# Patient Record
Sex: Female | Born: 1945 | Race: White | Hispanic: No | Marital: Married | Smoking: Former smoker
Health system: Southern US, Community
[De-identification: ages and names within clinical notes are randomized; demographics above are authoritative.]

## PROBLEM LIST (undated history)

## (undated) DIAGNOSIS — I10 Essential (primary) hypertension: Secondary | ICD-10-CM

## (undated) DIAGNOSIS — Z923 Personal history of irradiation: Secondary | ICD-10-CM

## (undated) DIAGNOSIS — E785 Hyperlipidemia, unspecified: Secondary | ICD-10-CM

## (undated) DIAGNOSIS — Z8042 Family history of malignant neoplasm of prostate: Secondary | ICD-10-CM

## (undated) DIAGNOSIS — F419 Anxiety disorder, unspecified: Secondary | ICD-10-CM

## (undated) DIAGNOSIS — R011 Cardiac murmur, unspecified: Secondary | ICD-10-CM

## (undated) DIAGNOSIS — G709 Myoneural disorder, unspecified: Secondary | ICD-10-CM

## (undated) DIAGNOSIS — M1990 Unspecified osteoarthritis, unspecified site: Secondary | ICD-10-CM

## (undated) DIAGNOSIS — IMO0001 Reserved for inherently not codable concepts without codable children: Secondary | ICD-10-CM

## (undated) DIAGNOSIS — IMO0002 Reserved for concepts with insufficient information to code with codable children: Secondary | ICD-10-CM

## (undated) DIAGNOSIS — C801 Malignant (primary) neoplasm, unspecified: Secondary | ICD-10-CM

## (undated) DIAGNOSIS — Z803 Family history of malignant neoplasm of breast: Secondary | ICD-10-CM

## (undated) DIAGNOSIS — R52 Pain, unspecified: Secondary | ICD-10-CM

## (undated) DIAGNOSIS — Z8051 Family history of malignant neoplasm of kidney: Secondary | ICD-10-CM

## (undated) HISTORY — DX: Hyperlipidemia, unspecified: E78.5

## (undated) HISTORY — PX: CATARACT EXTRACTION: SUR2

## (undated) HISTORY — DX: Unspecified osteoarthritis, unspecified site: M19.90

## (undated) HISTORY — PX: ABDOMINAL HYSTERECTOMY: SHX81

## (undated) HISTORY — PX: GANGLION CYST EXCISION: SHX1691

## (undated) HISTORY — PX: COLONOSCOPY: SHX174

## (undated) HISTORY — DX: Reserved for concepts with insufficient information to code with codable children: IMO0002

## (undated) HISTORY — DX: Family history of malignant neoplasm of breast: Z80.3

## (undated) HISTORY — PX: WISDOM TOOTH EXTRACTION: SHX21

## (undated) HISTORY — PX: FRACTURE SURGERY: SHX138

## (undated) HISTORY — DX: Reserved for inherently not codable concepts without codable children: IMO0001

## (undated) HISTORY — DX: Cardiac murmur, unspecified: R01.1

## (undated) HISTORY — DX: Family history of malignant neoplasm of prostate: Z80.42

## (undated) HISTORY — DX: Family history of malignant neoplasm of kidney: Z80.51

---

## 1999-04-05 ENCOUNTER — Encounter (INDEPENDENT_AMBULATORY_CARE_PROVIDER_SITE_OTHER): Payer: Self-pay | Admitting: Specialist

## 1999-04-05 ENCOUNTER — Other Ambulatory Visit: Admission: RE | Admit: 1999-04-05 | Discharge: 1999-04-05 | Payer: Self-pay | Admitting: Obstetrics and Gynecology

## 1999-06-08 ENCOUNTER — Encounter (INDEPENDENT_AMBULATORY_CARE_PROVIDER_SITE_OTHER): Payer: Self-pay | Admitting: Specialist

## 1999-06-08 ENCOUNTER — Ambulatory Visit (HOSPITAL_COMMUNITY): Admission: RE | Admit: 1999-06-08 | Discharge: 1999-06-08 | Payer: Self-pay | Admitting: Obstetrics and Gynecology

## 2000-01-28 ENCOUNTER — Other Ambulatory Visit: Admission: RE | Admit: 2000-01-28 | Discharge: 2000-01-28 | Payer: Self-pay | Admitting: Obstetrics and Gynecology

## 2000-02-18 ENCOUNTER — Encounter: Admission: RE | Admit: 2000-02-18 | Discharge: 2000-02-18 | Payer: Self-pay | Admitting: Obstetrics and Gynecology

## 2000-02-18 ENCOUNTER — Encounter: Payer: Self-pay | Admitting: Obstetrics and Gynecology

## 2001-03-26 ENCOUNTER — Encounter: Admission: RE | Admit: 2001-03-26 | Discharge: 2001-03-26 | Payer: Self-pay | Admitting: Obstetrics and Gynecology

## 2001-03-26 ENCOUNTER — Encounter: Payer: Self-pay | Admitting: Obstetrics and Gynecology

## 2001-04-27 ENCOUNTER — Other Ambulatory Visit: Admission: RE | Admit: 2001-04-27 | Discharge: 2001-04-27 | Payer: Self-pay | Admitting: Obstetrics and Gynecology

## 2002-05-18 ENCOUNTER — Encounter: Payer: Self-pay | Admitting: Family Medicine

## 2002-05-18 ENCOUNTER — Encounter: Admission: RE | Admit: 2002-05-18 | Discharge: 2002-05-18 | Payer: Self-pay | Admitting: Family Medicine

## 2003-04-19 ENCOUNTER — Other Ambulatory Visit: Admission: RE | Admit: 2003-04-19 | Discharge: 2003-04-19 | Payer: Self-pay | Admitting: Obstetrics and Gynecology

## 2003-05-26 ENCOUNTER — Encounter: Payer: Self-pay | Admitting: Family Medicine

## 2003-05-26 ENCOUNTER — Encounter: Admission: RE | Admit: 2003-05-26 | Discharge: 2003-05-26 | Payer: Self-pay | Admitting: Family Medicine

## 2004-04-24 ENCOUNTER — Other Ambulatory Visit: Admission: RE | Admit: 2004-04-24 | Discharge: 2004-04-24 | Payer: Self-pay | Admitting: Obstetrics and Gynecology

## 2004-05-29 ENCOUNTER — Ambulatory Visit (HOSPITAL_COMMUNITY): Admission: RE | Admit: 2004-05-29 | Discharge: 2004-05-29 | Payer: Self-pay | Admitting: Obstetrics and Gynecology

## 2005-06-11 ENCOUNTER — Other Ambulatory Visit: Admission: RE | Admit: 2005-06-11 | Discharge: 2005-06-11 | Payer: Self-pay | Admitting: Obstetrics and Gynecology

## 2005-07-02 ENCOUNTER — Ambulatory Visit (HOSPITAL_COMMUNITY): Admission: RE | Admit: 2005-07-02 | Discharge: 2005-07-02 | Payer: Self-pay | Admitting: Obstetrics and Gynecology

## 2005-07-04 ENCOUNTER — Ambulatory Visit: Payer: Self-pay | Admitting: Internal Medicine

## 2005-07-11 ENCOUNTER — Encounter: Admission: RE | Admit: 2005-07-11 | Discharge: 2005-07-11 | Payer: Self-pay | Admitting: Obstetrics and Gynecology

## 2005-07-16 ENCOUNTER — Encounter: Admission: RE | Admit: 2005-07-16 | Discharge: 2005-07-16 | Payer: Self-pay | Admitting: Obstetrics and Gynecology

## 2005-07-19 ENCOUNTER — Ambulatory Visit: Payer: Self-pay | Admitting: Internal Medicine

## 2006-01-07 ENCOUNTER — Encounter: Admission: RE | Admit: 2006-01-07 | Discharge: 2006-01-07 | Payer: Self-pay | Admitting: Obstetrics and Gynecology

## 2006-07-29 ENCOUNTER — Encounter: Admission: RE | Admit: 2006-07-29 | Discharge: 2006-07-29 | Payer: Self-pay | Admitting: Obstetrics and Gynecology

## 2007-07-31 ENCOUNTER — Encounter: Admission: RE | Admit: 2007-07-31 | Discharge: 2007-07-31 | Payer: Self-pay | Admitting: Obstetrics and Gynecology

## 2008-08-30 ENCOUNTER — Encounter: Admission: RE | Admit: 2008-08-30 | Discharge: 2008-08-30 | Payer: Self-pay | Admitting: Obstetrics and Gynecology

## 2009-09-01 ENCOUNTER — Encounter: Admission: RE | Admit: 2009-09-01 | Discharge: 2009-09-01 | Payer: Self-pay | Admitting: Obstetrics and Gynecology

## 2010-10-04 ENCOUNTER — Encounter
Admission: RE | Admit: 2010-10-04 | Discharge: 2010-10-04 | Payer: Self-pay | Source: Home / Self Care | Attending: Obstetrics and Gynecology | Admitting: Obstetrics and Gynecology

## 2010-10-20 ENCOUNTER — Encounter: Payer: Self-pay | Admitting: Obstetrics and Gynecology

## 2011-06-28 ENCOUNTER — Other Ambulatory Visit: Payer: Self-pay | Admitting: Internal Medicine

## 2011-06-28 DIAGNOSIS — N858 Other specified noninflammatory disorders of uterus: Secondary | ICD-10-CM

## 2011-07-04 ENCOUNTER — Ambulatory Visit
Admission: RE | Admit: 2011-07-04 | Discharge: 2011-07-04 | Disposition: A | Discharge status: Home / Self Care | Payer: Medicare Other | Source: Ambulatory Visit | Attending: Internal Medicine | Admitting: Internal Medicine

## 2011-07-04 DIAGNOSIS — N858 Other specified noninflammatory disorders of uterus: Secondary | ICD-10-CM

## 2011-11-05 DIAGNOSIS — H52219 Irregular astigmatism, unspecified eye: Secondary | ICD-10-CM | POA: Diagnosis not present

## 2011-11-05 DIAGNOSIS — H04129 Dry eye syndrome of unspecified lacrimal gland: Secondary | ICD-10-CM | POA: Diagnosis not present

## 2011-12-17 DIAGNOSIS — H04129 Dry eye syndrome of unspecified lacrimal gland: Secondary | ICD-10-CM | POA: Diagnosis not present

## 2011-12-17 DIAGNOSIS — H0230 Blepharochalasis unspecified eye, unspecified eyelid: Secondary | ICD-10-CM | POA: Diagnosis not present

## 2011-12-17 DIAGNOSIS — H18429 Band keratopathy, unspecified eye: Secondary | ICD-10-CM | POA: Diagnosis not present

## 2012-01-22 ENCOUNTER — Other Ambulatory Visit: Payer: Self-pay | Admitting: Obstetrics and Gynecology

## 2012-01-22 DIAGNOSIS — Z1231 Encounter for screening mammogram for malignant neoplasm of breast: Secondary | ICD-10-CM

## 2012-01-30 ENCOUNTER — Ambulatory Visit
Admission: RE | Admit: 2012-01-30 | Discharge: 2012-01-30 | Disposition: A | Discharge status: Home / Self Care | Payer: Medicare Other | Source: Ambulatory Visit | Attending: Obstetrics and Gynecology | Admitting: Obstetrics and Gynecology

## 2012-01-30 DIAGNOSIS — Z1231 Encounter for screening mammogram for malignant neoplasm of breast: Secondary | ICD-10-CM | POA: Diagnosis not present

## 2012-02-07 DIAGNOSIS — H2510 Age-related nuclear cataract, unspecified eye: Secondary | ICD-10-CM | POA: Diagnosis not present

## 2012-02-07 DIAGNOSIS — H04129 Dry eye syndrome of unspecified lacrimal gland: Secondary | ICD-10-CM | POA: Diagnosis not present

## 2012-02-07 DIAGNOSIS — H0230 Blepharochalasis unspecified eye, unspecified eyelid: Secondary | ICD-10-CM | POA: Diagnosis not present

## 2012-02-07 DIAGNOSIS — H43819 Vitreous degeneration, unspecified eye: Secondary | ICD-10-CM | POA: Diagnosis not present

## 2012-03-20 ENCOUNTER — Ambulatory Visit (INDEPENDENT_AMBULATORY_CARE_PROVIDER_SITE_OTHER): Payer: Medicare Other | Admitting: Family Medicine

## 2012-03-20 VITALS — BP 127/68 | HR 67 | Temp 98.0°F | Resp 16 | Ht 63.75 in | Wt 123.0 lb

## 2012-03-20 DIAGNOSIS — J029 Acute pharyngitis, unspecified: Secondary | ICD-10-CM

## 2012-03-20 DIAGNOSIS — R059 Cough, unspecified: Secondary | ICD-10-CM | POA: Diagnosis not present

## 2012-03-20 DIAGNOSIS — R05 Cough: Secondary | ICD-10-CM

## 2012-03-20 LAB — POCT RAPID STREP A (OFFICE): Rapid Strep A Screen: NEGATIVE

## 2012-03-20 NOTE — Progress Notes (Signed)
Patient Name: Dawn Powers Date of Birth: 06-25-1946 Medical Record Number: [id number] Gender: female Date of Encounter: 03/20/2012  History of Present Illness:  Dawn Powers is a 66 y.o. very pleasant female patient who presents with the following:  She noted a tickle and itchy cough for about 10 days.   Cough is productive in the morning, dry during the day and night.  No nasal congestion.  She does have a PND and a ST.  Her grandson is coming into town today and she wants to be sure she does not have strep.   Her grandson did have strep about 2 weeks ago- she was with him then.  He will be back today and she wants to be sure she does not have ST No fever, some chills.  No GI symptoms.  She has "not felt that bad."  Ears feel sore as well.    There is no problem list on file for this patient.  No past medical history on file. No past surgical history on file. History  Substance Use Topics  . Smoking status: Former Games developer  . Smokeless tobacco: Not on file  . Alcohol Use: Not on file   No family history on file. Allergies  Allergen Reactions  . Morphine And Related Hives    Medication list has been reviewed and updated.  Prior to Admission medications   Medication Sig Start Date End Date Taking? Authorizing Provider  calcium & magnesium carbonates (MYLANTA) 311-232 MG per tablet Take 1 tablet by mouth daily.   Yes Historical Provider, MD  glucosamine-chondroitin 500-400 MG tablet Take 1 tablet by mouth 3 (three) times daily.   Yes Historical Provider, MD  guaiFENesin (MUCINEX) 600 MG 12 hr tablet Take 1,200 mg by mouth 2 (two) times daily.   Yes Historical Provider, MD  magnesium 30 MG tablet Take 30 mg by mouth 2 (two) times daily.   Yes Historical Provider, MD    Review of Systems:  As per HPI- otherwise negative.   Physical Examination: Filed Vitals:   03/20/12 0806  BP: 127/68  Pulse: 67  Temp: 98 F (36.7 C)  Resp: 16   Filed Vitals:   03/20/12 0806    Height: 5' 3.75" (1.619 m)  Weight: 123 lb (55.792 kg)   Body mass index is 21.28 kg/(m^2). Ideal Body Weight: Weight in (lb) to have BMI = 25: 144.2   GEN: WDWN, NAD, Non-toxic, A & O x 3, slim build HEENT: Atraumatic, Normocephalic. Neck supple. No masses, No LAD.  TM and oropharynx wnl, nasal cavity normal Ears and Nose: No external deformity. CV: RRR, No M/G/R. No JVD. No thrill. No extra heart sounds. PULM: CTA B, no wheezes, crackles, rhonchi. No retractions. No resp. distress. No accessory muscle use. EXTR: No c/c/e NEURO Normal gait.  PSYCH: Normally interactive. Conversant. Not depressed or anxious appearing.  Calm demeanor.   Results for orders placed in visit on 03/20/12  POCT RAPID STREP A (OFFICE)      Component Value Range   Rapid Strep A Screen Negative  Negative    Assessment and Plan: 1. Sore throat  POCT rapid strep A, Culture, Group A Strep  2. Cough     Suspect a viral URI.  She feels as through she is getting better.  Declined anything like tessalon perles or atrovent nasal- she feels that she is getting along ok.  Will let her know if throat culture turns up positive.   Patient (or parent if  minor) instructed to return to clinic or call if not better in 2-3 day(s).  Sooner if worse.      Abbe Amsterdam, MD

## 2012-03-22 LAB — CULTURE, GROUP A STREP: Organism ID, Bacteria: NORMAL

## 2012-07-30 ENCOUNTER — Encounter: Payer: Self-pay | Admitting: Family Medicine

## 2012-07-30 ENCOUNTER — Ambulatory Visit (INDEPENDENT_AMBULATORY_CARE_PROVIDER_SITE_OTHER): Payer: Medicare Other | Admitting: Family Medicine

## 2012-07-30 VITALS — BP 102/60 | HR 67 | Temp 99.1°F | Resp 16 | Ht 63.0 in | Wt 123.2 lb

## 2012-07-30 DIAGNOSIS — Z0000 Encounter for general adult medical examination without abnormal findings: Secondary | ICD-10-CM | POA: Diagnosis not present

## 2012-07-30 DIAGNOSIS — E785 Hyperlipidemia, unspecified: Secondary | ICD-10-CM | POA: Diagnosis not present

## 2012-07-30 DIAGNOSIS — L989 Disorder of the skin and subcutaneous tissue, unspecified: Secondary | ICD-10-CM | POA: Diagnosis not present

## 2012-07-30 DIAGNOSIS — L509 Urticaria, unspecified: Secondary | ICD-10-CM

## 2012-07-30 DIAGNOSIS — Z23 Encounter for immunization: Secondary | ICD-10-CM | POA: Diagnosis not present

## 2012-07-30 DIAGNOSIS — M899 Disorder of bone, unspecified: Secondary | ICD-10-CM | POA: Diagnosis not present

## 2012-07-30 DIAGNOSIS — M949 Disorder of cartilage, unspecified: Secondary | ICD-10-CM | POA: Diagnosis not present

## 2012-07-30 DIAGNOSIS — M8580 Other specified disorders of bone density and structure, unspecified site: Secondary | ICD-10-CM

## 2012-07-30 LAB — CBC
HCT: 40.4 % (ref 36.0–46.0)
Hemoglobin: 13.9 g/dL (ref 12.0–15.0)
MCH: 32 pg (ref 26.0–34.0)
MCHC: 34.4 g/dL (ref 30.0–36.0)
MCV: 92.9 fL (ref 78.0–100.0)
Platelets: 220 10*3/uL (ref 150–400)
RBC: 4.35 MIL/uL (ref 3.87–5.11)
RDW: 13.1 % (ref 11.5–15.5)
WBC: 3.6 10*3/uL — ABNORMAL LOW (ref 4.0–10.5)

## 2012-07-30 LAB — LIPID PANEL
Cholesterol: 257 mg/dL — ABNORMAL HIGH (ref 0–200)
HDL: 102 mg/dL (ref >39)
LDL Cholesterol: 144 mg/dL — ABNORMAL HIGH (ref 0–99)
Total CHOL/HDL Ratio: 2.5 Ratio
Triglycerides: 54 mg/dL (ref <150)
VLDL: 11 mg/dL (ref 0–40)

## 2012-07-30 LAB — POCT UA - MICROSCOPIC ONLY
Bacteria, U Microscopic: NEGATIVE
Casts, Ur, LPF, POC: NEGATIVE
Crystals, Ur, HPF, POC: NEGATIVE
Mucus, UA: NEGATIVE
RBC, urine, microscopic: NEGATIVE
WBC, Ur, HPF, POC: NEGATIVE
Yeast, UA: NEGATIVE

## 2012-07-30 LAB — COMPREHENSIVE METABOLIC PANEL
ALT: 18 U/L (ref 0–35)
AST: 22 U/L (ref 0–37)
Albumin: 4.6 g/dL (ref 3.5–5.2)
Alkaline Phosphatase: 45 U/L (ref 39–117)
BUN: 20 mg/dL (ref 6–23)
CO2: 26 mEq/L (ref 19–32)
Calcium: 10 mg/dL (ref 8.4–10.5)
Chloride: 104 mEq/L (ref 96–112)
Creat: 1 mg/dL (ref 0.50–1.10)
Glucose, Bld: 87 mg/dL (ref 70–99)
Potassium: 4.1 mEq/L (ref 3.5–5.3)
Sodium: 143 mEq/L (ref 135–145)
Total Bilirubin: 0.9 mg/dL (ref 0.3–1.2)
Total Protein: 7 g/dL (ref 6.0–8.3)

## 2012-07-30 LAB — POCT URINALYSIS DIPSTICK
Bilirubin, UA: NEGATIVE
Blood, UA: NEGATIVE
Glucose, UA: NEGATIVE
Ketones, UA: NEGATIVE
Leukocytes, UA: NEGATIVE
Nitrite, UA: NEGATIVE
Protein, UA: NEGATIVE
Spec Grav, UA: 1.015
Urobilinogen, UA: 0.2
pH, UA: 8.5

## 2012-07-30 NOTE — Progress Notes (Signed)
@[email or web address] @  Patient ID: Dawn Powers MRN: [id number], DOB: [birth year], 66 y.o. Date of Encounter: 07/30/2012, 9:21 AM  Primary Physician: No primary provider on file.  Chief Complaint: Physical (CPE)  HPI: 66 y.o. y/o female with history of noted below here for CPE.  Doing well:  Concerned about osteopenia but refuses to take bisphosphonates  Right foot pain with skin thickening  Some left shoulder crepitus  Lost 20 lbs on her own over past 15 months  Doing yoga  Concerned about breast cancer in left breast, no sure if there is a lump  Night cramps in calves despite magnesium supplementation Son died 6 years ago after suicide from many years of drugs and mental illness.  LMP: n/a (pelvic exam by Dr. Jackelyn Knife 1 year ago) Pap: 1 year ago MMG:  May 2013 Review of Systems: Consitutional: No fever, chills, fatigue, night sweats, lymphadenopathy, or weight changes. Eyes: No visual changes, eye redness, or discharge. ENT/Mouth: Ears: No otalgia, tinnitus, hearing loss, discharge. Nose: No congestion, rhinorrhea, sinus pain, or epistaxis. Throat: No sore throat, post nasal drip, or teeth pain. Cardiovascular: No CP, palpitations, diaphoresis, DOE, edema, orthopnea, PND. Respiratory: No cough, hemoptysis, SOB, or wheezing. Gastrointestinal: No anorexia, dysphagia, reflux, pain, nausea, vomiting, hematemesis, diarrhea, constipation, BRBPR, or melena. Breast: No discharge, pain, swelling, or mass. Genitourinary: No dysuria, frequency, urgency, hematuria, incontinence, nocturia, amenorrhea, vaginal discharge, pruritis, burning, abnormal bleeding, or pain. Musculoskeletal: No decreased ROM, myalgias, stiffness, joint swelling, or weakness. Skin: No rash, erythema, lesion changes, pain, warmth, jaundice, or pruritis. Neurological: No headache, dizziness, syncope, seizures, tremors, memory loss, coordination problems, or paresthesias. Psychological: No anxiety, depression, hallucinations,  SI/HI. Endocrine: No fatigue, polydipsia, polyphagia, polyuria, or known diabetes. All other systems were reviewed and are otherwise negative.  Past Medical History  Diagnosis Date  . Hyperlipidemia      No past surgical history on file.  Home Meds:  Prior to Admission medications   Medication Sig Start Date End Date Taking? Authorizing Provider  calcium & magnesium carbonates (MYLANTA) 311-232 MG per tablet Take 1 tablet by mouth daily.   Yes Historical Provider, MD  glucosamine-chondroitin 500-400 MG tablet Take 1 tablet by mouth 3 (three) times daily.   Yes Historical Provider, MD  magnesium 30 MG tablet Take 30 mg by mouth 2 (two) times daily.   Yes Historical Provider, MD  Propylene Glycol (SYSTANE BALANCE OP) Apply to eye 3 (three) times daily.   Yes Historical Provider, MD  guaiFENesin (MUCINEX) 600 MG 12 hr tablet Take 1,200 mg by mouth 2 (two) times daily.    Historical Provider, MD    Allergies:  Allergies  Allergen Reactions  . Morphine And Related Hives    History   Social History  . Marital Status: Married    Spouse Name: N/A    Number of Children: N/A  . Years of Education: N/A   Occupational History  . Not on file.   Social History Main Topics  . Smoking status: Former Smoker -- 4 years    Types: Cigarettes    Quit date: 09/30/2001  . Smokeless tobacco: Not on file  . Alcohol Use: Yes     [employer]  . Drug Use: Not on file  . Sexually Active: Not on file   Other Topics Concern  . Not on file   Social History Narrative  . No narrative on file    Family History  Problem Relation Age of Onset  . Cancer Mother   . Heart  disease Mother   . Alcohol abuse Mother   . Dementia Mother   . Cancer Father   . Alcohol abuse Father   . Diabetes Brother     Physical Exam:  Blood pressure 102/60, pulse 67, temperature 99.1 F (37.3 C), temperature source Oral, resp. rate 16, height 5\' 3"  (1.6 m), weight 123 lb 3.2 oz (55.883 kg), SpO2 98.00%.,  Body mass index is 21.82 kg/(m^2). General: Well developed, well nourished, in no acute distress. HEENT: Normocephalic, atraumatic. Conjunctiva pink, sclera non-icteric. Pupils 2 mm constricting to 1 mm, round, regular, and equally reactive to light and accomodation. EOMI. Internal auditory canal clear. TMs with good cone of light and without pathology. Nasal mucosa pink. Nares are without discharge. No sinus tenderness. Oral mucosa pink. Dentition good. Pharynx without exudate.   Neck: Supple. Trachea midline. No thyromegaly. Full ROM. No lymphadenopathy. Lungs: Clear to auscultation bilaterally without wheezes, rales, or rhonchi. Breathing is of normal effort and unlabored. Cardiovascular: RRR with S1 S2. No murmurs, rubs, or gallops appreciated. Distal pulses 2+ symmetrically. No carotid or abdominal bruits. Breast: Symmetrical. No masses. Nipples without discharge. Abdomen: Soft, non-tender, non-distended with normoactive bowel sounds. No hepatosplenomegaly or masses. No rebound/guarding. No CVA tenderness. Without hernias. Musculoskeletal: Full range of motion and 5/5 strength throughout. Without swelling, atrophy, tenderness, crepitus, or warmth. Extremities without clubbing, cyanosis, or edema. Calves supple. Skin: Warm and moist without erythema, ecchymosis, wounds, or rash.  1-2 mm corn right plantar surface at Vth metatarsal head. Neuro: A+Ox3. CN II-XII grossly intact. Moves all extremities spontaneously. Full sensation throughout. Normal gait. DTR 2+ throughout upper and lower extremities. Finger to nose intact. Psych:  Responds to questions appropriately with a normal affect.   Studies: CBC, CMET, Lipid, TSH, Vitamin D all pending.  UA:   Assessment/Plan:  66 y.o. y/o female here for CPE -  Signed, Elvina Sidle, MD 07/30/2012 9:21 AM

## 2012-07-30 NOTE — Patient Instructions (Addendum)

## 2012-07-31 DIAGNOSIS — H04129 Dry eye syndrome of unspecified lacrimal gland: Secondary | ICD-10-CM | POA: Diagnosis not present

## 2012-07-31 DIAGNOSIS — H18459 Nodular corneal degeneration, unspecified eye: Secondary | ICD-10-CM | POA: Diagnosis not present

## 2012-07-31 DIAGNOSIS — H2510 Age-related nuclear cataract, unspecified eye: Secondary | ICD-10-CM | POA: Diagnosis not present

## 2012-07-31 LAB — VITAMIN D 25 HYDROXY (VIT D DEFICIENCY, FRACTURES): Vit D, 25-Hydroxy: 42 ng/mL (ref 30–89)

## 2012-08-06 ENCOUNTER — Telehealth: Payer: Self-pay

## 2012-08-06 NOTE — Telephone Encounter (Signed)
The patient called to state that she was supposed to have her lab results from 07/30/12 mailed to her and she has not received them as of yet.  The patient also wants Dr. Milus Glazier to call her to discuss results at [phone].

## 2012-08-09 NOTE — Telephone Encounter (Signed)
Dr. Elbert Ewings, I reprinted labs for Pt and put them in the mail Sunday 08/09/12. Pt is requesting that you call her to discuss results with her at [phone]. Thanks! Eileen Stanford

## 2012-08-10 DIAGNOSIS — Z124 Encounter for screening for malignant neoplasm of cervix: Secondary | ICD-10-CM | POA: Diagnosis not present

## 2012-08-17 ENCOUNTER — Telehealth: Payer: Self-pay

## 2012-08-17 NOTE — Telephone Encounter (Signed)
[email or web address]  Dawn Powers would like to ask Dr Milus Glazier: "Do I need an expanded lipid profile per this article?"  [phone]

## 2012-12-09 ENCOUNTER — Telehealth: Payer: Self-pay

## 2012-12-09 DIAGNOSIS — E785 Hyperlipidemia, unspecified: Secondary | ICD-10-CM

## 2012-12-09 NOTE — Telephone Encounter (Signed)
Pt states that she would like for MRI  Notes from a couple years ago to be sent to Dr.Gene Lewis's chiropratic office, I have informed pt that she should come in and fill out authorization but she insists on me putting in a message for medical records.  Best#  [phone]  Dr. Wilma Flavin  Fax: [phone]

## 2012-12-10 DIAGNOSIS — M999 Biomechanical lesion, unspecified: Secondary | ICD-10-CM | POA: Diagnosis not present

## 2012-12-10 DIAGNOSIS — M545 Low back pain, unspecified: Secondary | ICD-10-CM | POA: Diagnosis not present

## 2012-12-10 NOTE — Telephone Encounter (Signed)
Left message on voicemail machine for patient saying that if she wants [place] to process her request, she will be required to fill out our release form to have her records sent to the chiropractor. Also notified her that she can either come to our office and sign one or we can fax it to his office if they don't have a release of their own.

## 2012-12-11 DIAGNOSIS — M999 Biomechanical lesion, unspecified: Secondary | ICD-10-CM | POA: Diagnosis not present

## 2012-12-11 DIAGNOSIS — M545 Low back pain, unspecified: Secondary | ICD-10-CM | POA: Diagnosis not present

## 2012-12-14 DIAGNOSIS — M999 Biomechanical lesion, unspecified: Secondary | ICD-10-CM | POA: Diagnosis not present

## 2012-12-14 DIAGNOSIS — M545 Low back pain, unspecified: Secondary | ICD-10-CM | POA: Diagnosis not present

## 2012-12-15 DIAGNOSIS — M545 Low back pain, unspecified: Secondary | ICD-10-CM | POA: Diagnosis not present

## 2012-12-15 DIAGNOSIS — M999 Biomechanical lesion, unspecified: Secondary | ICD-10-CM | POA: Diagnosis not present

## 2012-12-16 DIAGNOSIS — M545 Low back pain, unspecified: Secondary | ICD-10-CM | POA: Diagnosis not present

## 2012-12-16 DIAGNOSIS — M999 Biomechanical lesion, unspecified: Secondary | ICD-10-CM | POA: Diagnosis not present

## 2012-12-17 DIAGNOSIS — M545 Low back pain, unspecified: Secondary | ICD-10-CM | POA: Diagnosis not present

## 2012-12-17 DIAGNOSIS — M999 Biomechanical lesion, unspecified: Secondary | ICD-10-CM | POA: Diagnosis not present

## 2012-12-17 NOTE — Telephone Encounter (Signed)
Medicare will allow her to have her lipids checked.Marland Kitchen

## 2012-12-17 NOTE — Telephone Encounter (Signed)
Her lipids were checked on 07/30/12, do you think too soon to check again? Medicare may not cover, she is requesting this to be done. Please advise.

## 2012-12-17 NOTE — Telephone Encounter (Signed)
Thanks, called her to advise okay to do this if she wants. Advised her to fast. She wants to come in the day before. Future order placed.

## 2012-12-17 NOTE — Telephone Encounter (Signed)
Pt is requesting her cholesterol checked on the morning of her appointment next week with Dr. Cleta Alberts.  [phone]

## 2012-12-18 DIAGNOSIS — M545 Low back pain, unspecified: Secondary | ICD-10-CM | POA: Diagnosis not present

## 2012-12-18 DIAGNOSIS — M999 Biomechanical lesion, unspecified: Secondary | ICD-10-CM | POA: Diagnosis not present

## 2012-12-21 ENCOUNTER — Telehealth: Payer: Self-pay

## 2012-12-21 DIAGNOSIS — M545 Low back pain, unspecified: Secondary | ICD-10-CM | POA: Diagnosis not present

## 2012-12-21 DIAGNOSIS — M999 Biomechanical lesion, unspecified: Secondary | ICD-10-CM | POA: Diagnosis not present

## 2012-12-21 NOTE — Telephone Encounter (Signed)
Patient has an appointment w/ Dr. Cleta Alberts tomorrow at 2:45.  She is requesting Labs be drwawn in the morning to include Vitamin D and Calcium.    [phone] (H)

## 2012-12-21 NOTE — Telephone Encounter (Signed)
I will be there at 7:30 and I can write down what labs to order.

## 2012-12-22 ENCOUNTER — Ambulatory Visit (INDEPENDENT_AMBULATORY_CARE_PROVIDER_SITE_OTHER): Payer: Medicare Other | Admitting: Emergency Medicine

## 2012-12-22 ENCOUNTER — Ambulatory Visit: Payer: Medicare Other

## 2012-12-22 ENCOUNTER — Encounter: Payer: Self-pay | Admitting: Emergency Medicine

## 2012-12-22 VITALS — BP 136/60 | HR 63 | Temp 98.0°F | Resp 16 | Ht 63.0 in | Wt 126.0 lb

## 2012-12-22 DIAGNOSIS — M549 Dorsalgia, unspecified: Secondary | ICD-10-CM | POA: Diagnosis not present

## 2012-12-22 DIAGNOSIS — R079 Chest pain, unspecified: Secondary | ICD-10-CM

## 2012-12-22 DIAGNOSIS — M810 Age-related osteoporosis without current pathological fracture: Secondary | ICD-10-CM

## 2012-12-22 DIAGNOSIS — M8580 Other specified disorders of bone density and structure, unspecified site: Secondary | ICD-10-CM

## 2012-12-22 DIAGNOSIS — R0781 Pleurodynia: Secondary | ICD-10-CM

## 2012-12-22 DIAGNOSIS — E785 Hyperlipidemia, unspecified: Secondary | ICD-10-CM

## 2012-12-22 DIAGNOSIS — M4120 Other idiopathic scoliosis, site unspecified: Secondary | ICD-10-CM | POA: Insufficient documentation

## 2012-12-22 DIAGNOSIS — R9389 Abnormal findings on diagnostic imaging of other specified body structures: Secondary | ICD-10-CM

## 2012-12-22 LAB — CBC
HCT: 40.2 % (ref 36.0–46.0)
Hemoglobin: 13.9 g/dL (ref 12.0–15.0)
MCH: 30.9 pg (ref 26.0–34.0)
MCHC: 34.6 g/dL (ref 30.0–36.0)
MCV: 89.3 fL (ref 78.0–100.0)
Platelets: 220 10*3/uL (ref 150–400)
RBC: 4.5 MIL/uL (ref 3.87–5.11)
RDW: 13.4 % (ref 11.5–15.5)
WBC: 7.2 10*3/uL (ref 4.0–10.5)

## 2012-12-22 LAB — COMPREHENSIVE METABOLIC PANEL
ALT: 17 U/L (ref 0–35)
AST: 21 U/L (ref 0–37)
Albumin: 4.3 g/dL (ref 3.5–5.2)
Alkaline Phosphatase: 45 U/L (ref 39–117)
BUN: 22 mg/dL (ref 6–23)
CO2: 32 mEq/L (ref 19–32)
Calcium: 9.7 mg/dL (ref 8.4–10.5)
Chloride: 104 mEq/L (ref 96–112)
Creat: 0.98 mg/dL (ref 0.50–1.10)
Glucose, Bld: 85 mg/dL (ref 70–99)
Potassium: 4.4 mEq/L (ref 3.5–5.3)
Sodium: 142 mEq/L (ref 135–145)
Total Bilirubin: 0.6 mg/dL (ref 0.3–1.2)
Total Protein: 6.3 g/dL (ref 6.0–8.3)

## 2012-12-22 LAB — POCT URINALYSIS DIPSTICK
Bilirubin, UA: NEGATIVE
Blood, UA: NEGATIVE
Glucose, UA: NEGATIVE
Leukocytes, UA: NEGATIVE
Nitrite, UA: NEGATIVE
Protein, UA: NEGATIVE
Spec Grav, UA: 1.02
Urobilinogen, UA: 0.2
pH, UA: 7

## 2012-12-22 LAB — LIPID PANEL
Cholesterol: 207 mg/dL — ABNORMAL HIGH (ref 0–200)
HDL: 77 mg/dL (ref >39)
LDL Cholesterol: 117 mg/dL — ABNORMAL HIGH (ref 0–99)
Total CHOL/HDL Ratio: 2.7 Ratio
Triglycerides: 66 mg/dL (ref <150)
VLDL: 13 mg/dL (ref 0–40)

## 2012-12-22 LAB — POCT UA - MICROSCOPIC ONLY
Bacteria, U Microscopic: NEGATIVE
Casts, Ur, LPF, POC: NEGATIVE
Crystals, Ur, HPF, POC: NEGATIVE
RBC, urine, microscopic: NEGATIVE
WBC, Ur, HPF, POC: NEGATIVE
Yeast, UA: NEGATIVE

## 2012-12-22 LAB — TSH: TSH: 2.467 u[IU]/mL (ref 0.350–4.500)

## 2012-12-22 MED ORDER — CYCLOBENZAPRINE HCL 5 MG PO TABS
5.0000 mg | ORAL_TABLET | Freq: Three times a day (TID) | ORAL | Status: DC | PRN
Start: 2012-12-22 — End: 2013-05-10

## 2012-12-22 MED ORDER — HYDROCODONE-ACETAMINOPHEN 5-325 MG PO TABS: 1.0000 | ORAL_TABLET | Freq: Four times a day (QID) | ORAL | Status: DC | PRN

## 2012-12-22 NOTE — Progress Notes (Signed)
  Subjective:    Patient ID: Dawn Powers, female    DOB: 25-Jun-1946, 67 y.o.   MRN: [id number]  HPI  Patient states that 4 years ago she had rib pain that she had to have injections for. She is having those same issues again. She had some pain medication left over from 2009. She used it.   Two weeks ago her shoulders started hurting. She went and saw Billey Gosling Chiropractor. It would help for a few hours after the "electronic" treatments. She was using Hydrocodone to elevate the pain. She saw him 7 times. It has resolved.   If she turns to the left it is very painful in her side/ rib. It started about 4 months ago. It hurts with twisting and exercising. It is not unbearable, it makes her uncomfortable. She does not want to take the hydrocodone on a regular basis but wants a refill to have in her possession for these times when the pain is unbearable. She suffers from a thoracic muscle spasm.   She is up to date with her mammogram, she has them yearly. They have all been ok. She used to smoke cigarettes 10 years ago. She had a bone density study 4-5 years ago. It showed Osteopenia. It was done through Dr. Krista Blue her GYN.   Family history consist of back problems. Her sister and her mother. No history of cancer. Many family members with alcoholism. Brothers have hypertension, one is 43 and the other is 18.   She wants her cholesterol checked. It was high last time she had it checked.   Review of Systems     Objective:   Physical Exam pupils equal round regular react to light TMs are clear. Throat is normal. Neck is supple. Chest is clear to auscultation and percussion. There is a significant thoracic scoliotic curve. Heart is regular rate without murmurs rubs or gallops. There are no carotid bruits heard. The abdomen was soft there was no mass felt in the left upper abdomen. Extremity exam reveals no cyanosis clubbing or edema. Neurological examination revealed cranial nerves intact motor strength  upper and lower extremities 2+ and symmetrical. There is a small callous area on the bottom of the left foot and    [facility] reading (PRIMARY) by  Dr. Cleta Alberts is significant scoliotic curve in the thoracic area. There is suggestion of blunting of the left costophrenic angle question fluid, question mass please comment. Ribs appear normal.      Assessment & Plan:  She will schedule her own mammogram. She does have osteopenia on a low vitamin D. She is having significant problems with back pain and gets chiropractic care for this. On her x-ray I was concerned about an area at the left costophrenic angle. We'll proceed with a CT of this area to be sure that this is only scar tissue as suggested by radiologist. Routine labs were done

## 2012-12-23 ENCOUNTER — Telehealth: Payer: Self-pay | Admitting: Radiology

## 2012-12-23 NOTE — Telephone Encounter (Signed)
CT is better for chest imaging. Shows lung and bone best. Left message for her. Asked Dr Cleta Alberts about the labs. His phone cut out. I will get answer from him.

## 2012-12-23 NOTE — Telephone Encounter (Signed)
Patient wants to know the difference between a CT Scan and MRI.  She wants to know why a chest scan has been ordered.   Best number today is [phone]

## 2012-12-23 NOTE — Telephone Encounter (Signed)
This is done.

## 2012-12-24 ENCOUNTER — Ambulatory Visit
Admission: RE | Admit: 2012-12-24 | Discharge: 2012-12-24 | Disposition: A | Discharge status: Home / Self Care | Payer: Medicare Other | Source: Ambulatory Visit | Attending: Emergency Medicine | Admitting: Emergency Medicine

## 2012-12-24 DIAGNOSIS — M545 Low back pain, unspecified: Secondary | ICD-10-CM | POA: Diagnosis not present

## 2012-12-24 DIAGNOSIS — S298XXA Other specified injuries of thorax, initial encounter: Secondary | ICD-10-CM | POA: Diagnosis not present

## 2012-12-24 DIAGNOSIS — R079 Chest pain, unspecified: Secondary | ICD-10-CM | POA: Diagnosis not present

## 2012-12-24 DIAGNOSIS — R9389 Abnormal findings on diagnostic imaging of other specified body structures: Secondary | ICD-10-CM

## 2012-12-24 DIAGNOSIS — M999 Biomechanical lesion, unspecified: Secondary | ICD-10-CM | POA: Diagnosis not present

## 2012-12-24 NOTE — Telephone Encounter (Signed)
duplicate

## 2012-12-29 DIAGNOSIS — M545 Low back pain, unspecified: Secondary | ICD-10-CM | POA: Diagnosis not present

## 2012-12-29 DIAGNOSIS — M999 Biomechanical lesion, unspecified: Secondary | ICD-10-CM | POA: Diagnosis not present

## 2012-12-31 DIAGNOSIS — M999 Biomechanical lesion, unspecified: Secondary | ICD-10-CM | POA: Diagnosis not present

## 2012-12-31 DIAGNOSIS — M545 Low back pain, unspecified: Secondary | ICD-10-CM | POA: Diagnosis not present

## 2013-01-07 DIAGNOSIS — M545 Low back pain, unspecified: Secondary | ICD-10-CM | POA: Diagnosis not present

## 2013-01-07 DIAGNOSIS — M999 Biomechanical lesion, unspecified: Secondary | ICD-10-CM | POA: Diagnosis not present

## 2013-01-08 ENCOUNTER — Encounter: Payer: Self-pay | Admitting: Emergency Medicine

## 2013-01-11 ENCOUNTER — Ambulatory Visit (INDEPENDENT_AMBULATORY_CARE_PROVIDER_SITE_OTHER): Payer: Medicare Other | Admitting: Family Medicine

## 2013-01-11 VITALS — BP 131/75 | HR 68 | Temp 98.1°F | Resp 16 | Ht 63.5 in | Wt 124.0 lb

## 2013-01-11 DIAGNOSIS — M25519 Pain in unspecified shoulder: Secondary | ICD-10-CM | POA: Diagnosis not present

## 2013-01-11 DIAGNOSIS — W57XXXA Bitten or stung by nonvenomous insect and other nonvenomous arthropods, initial encounter: Secondary | ICD-10-CM | POA: Diagnosis not present

## 2013-01-11 DIAGNOSIS — T148 Other injury of unspecified body region: Secondary | ICD-10-CM | POA: Diagnosis not present

## 2013-01-11 DIAGNOSIS — M25512 Pain in left shoulder: Secondary | ICD-10-CM

## 2013-01-11 MED ORDER — DOXYCYCLINE HYCLATE 100 MG PO TABS: 100.0000 mg | ORAL_TABLET | Freq: Two times a day (BID) | ORAL | Status: DC

## 2013-01-11 NOTE — Progress Notes (Signed)
[facility] [address] [place] [address] [phone]  Date:  01/11/2013   Name:  Dawn Powers   DOB:  13-Jan-1946   MRN:  [id number]  PCP:  No primary provider on file.    Chief Complaint: Tick Removal   History of Present Illness:  Dawn Powers is a 67 y.o. very pleasant female patient who presents with the following:  She was [place] in [place] in [place].  She tends to get some tick bites, and often will have a reaction to the bite- a red mark around the bite and itching.  These reactions will take some time to go away. She has never gotten sick from ticks in the past.  She found a tick 2 days it- it was tiny.  A deer tick.  She was able to pull it out with tweezers.  She is concerned as she has a larger red area around the bite than she has had in the past, and it is itchy.  She has looked on the Internet and does not think this looks like erythema migrans.  Otherwise she feels fine, and does not want to use abx unless absolutely necessary.    She also notes some occasional pain in her left shoulder with reaching overhead- wonders if she might have some RC tendonitis.  This is not really a problem, but she would like to have it looked at.  Otherwise she is generally healthy   Patient Active Problem List  Diagnosis  . Scoliosis (and kyphoscoliosis), idiopathic  . Osteopenia  . Other and unspecified hyperlipidemia    Past Medical History  Diagnosis Date  . Hyperlipidemia   . Arthritis   . Heart murmur     Past Surgical History  Procedure Laterality Date  . Fracture surgery      History  Substance Use Topics  . Smoking status: Former Smoker -- 4 years    Types: Cigarettes    Quit date: 09/30/2001  . Smokeless tobacco: Not on file  . Alcohol Use: Yes     Comment: [employer]    Family History  Problem Relation Age of Onset  . Cancer Mother   . Heart disease Mother   . Alcohol abuse Mother   . Dementia Mother   .  Cancer Father   . Alcohol abuse Father   . Diabetes Brother   . Alcohol abuse Maternal Grandfather     Allergies  Allergen Reactions  . Morphine And Related Hives    Medication list has been reviewed and updated.  Current Outpatient Prescriptions on File Prior to Visit  Medication Sig Dispense Refill  . calcium & magnesium carbonates (MYLANTA) 311-232 MG per tablet Take 1 tablet by mouth daily.      Marland Kitchen glucosamine-chondroitin 500-400 MG tablet Take 1 tablet by mouth 3 (three) times daily.      . magnesium 30 MG tablet Take 30 mg by mouth 2 (two) times daily.      Marland Kitchen Propylene Glycol (SYSTANE BALANCE OP) Apply to eye 3 (three) times daily.      . cyclobenzaprine (FLEXERIL) 5 MG tablet Take 1 tablet (5 mg total) by mouth 3 (three) times daily as needed for muscle spasms.  30 tablet  1  . guaiFENesin (MUCINEX) 600 MG 12 hr tablet Take 1,200 mg by mouth 2 (two) times daily.      Marland Kitchen HYDROcodone-acetaminophen (NORCO) 5-325 MG per tablet Take 1 tablet by mouth every 6 (six)  hours as needed for pain.  30 tablet  0   No current facility-administered medications on file prior to visit.    Review of Systems:  As per HPI- otherwise negative. No fever or other symptoms.    Physical Examination: Filed Vitals:   01/11/13 1548  BP: 131/75  Pulse: 68  Temp: 98.1 F (36.7 C)  Resp: 16   Filed Vitals:   01/11/13 1548  Height: 5' 3.5" (1.613 m)  Weight: 124 lb (56.246 kg)   Body mass index is 21.62 kg/(m^2). Ideal Body Weight: Weight in (lb) to have BMI = 25: 143.1  GEN: WDWN, NAD, Non-toxic, A & O x 3 HEENT: Atraumatic, Normocephalic. Neck supple. No masses, No LAD.  Oropharynx wnl Ears and Nose: No external deformity. CV: RRR, No M/G/R. No JVD. No thrill. No extra heart sounds. PULM: CTA B, no wheezes, crackles, rhonchi. No retractions. No resp. distress. No accessory muscle use. EXTR: No c/c/e NEURO Normal gait.  PSYCH: Normally interactive. Conversant. Not depressed or anxious  appearing.  Calm demeanor.  Left shoulder: mild tenderness over the anterior RCT insertion, and some tenderness but full ROM with internal and external rotation, and some pain but not weakness with empty can.   Left inner thigh: rash which appears to be a reaction to a tick bite.  There is no central clearing, the rash has a central darker/ more erythematous area and surrounding milder erythema.  It is minimally warm and appears consistent with localized irritation   Assessment and Plan: Tick bite - Plan: doxycycline (VIBRA-TABS) 100 MG tablet  Pain in joint, shoulder region, left  Tick bite- at this time she does not have erythema migrans.  She was in a low- risk area when she contracted the tick bite.  She would prefer to wait and observe the bite site, and see if a more characteristic rash develops.  This is a reasonable choice- given an rx for doxycycline to hold, and she is to contact me if the rash shows any central clearing or does not go away soon.    Mild RCT.  Avoid reaching over- head.  She will let us know if she would like to attend PT.    Signed Abbe Amsterdam, MD

## 2013-01-11 NOTE — Patient Instructions (Addendum)
Keep an eye on your rash.  If it does not clear up or shows any central clearing please let me know right away and start the doxycycline.

## 2013-01-14 ENCOUNTER — Telehealth: Payer: Self-pay

## 2013-01-14 NOTE — Telephone Encounter (Signed)
PT WOULD LIKE FOR ONLY DR [name] TO GIVE HER A CALL BACK, WAS TOLD DR [name] WAS OUT OF THE OFFICE TODAY, BUT SHE WANTS SOMEONE TO GET IN TOUCH WITH HER SO SHE CAN DECIDE IF SHE WANT TO CALL HER OR NOT. PLEASE CALL [phone]

## 2013-01-14 NOTE — Telephone Encounter (Signed)
Called her, patient states she is calling about the tick bite, she is not taking the medication, she is okay, but states she is nervous. Advised her to watch for fever and headache. Told her I am sending this to you.

## 2013-01-14 NOTE — Telephone Encounter (Signed)
Called but [facility]- call back if she has any more concerns

## 2013-01-14 NOTE — Telephone Encounter (Signed)
Pt called office and would like for you to call her back. She will be by her phone.  She would like to speak with you before she go out of town.

## 2013-01-15 NOTE — Telephone Encounter (Signed)
Called her back- she decided to take the doxycycline due to a HA and a change in the appearance of her rash.  She also did some more research about lyme in the area where she was visiting and it seems there is lyme disease there.  She has taken 2 doxy pills and her rash is better already.  Suspect this was a localized bite reaction, but doxy is unlikely to harm her.  Have given her a 10 day course which should be adequate for possible early lyme

## 2013-01-20 DIAGNOSIS — M999 Biomechanical lesion, unspecified: Secondary | ICD-10-CM | POA: Diagnosis not present

## 2013-01-20 DIAGNOSIS — M546 Pain in thoracic spine: Secondary | ICD-10-CM | POA: Diagnosis not present

## 2013-03-01 ENCOUNTER — Telehealth: Payer: Self-pay

## 2013-03-01 NOTE — Telephone Encounter (Signed)
Pt called and LM on my VM. She stated she had seen Dr Cleta Alberts about a month ago and had an x ray and CT done. She is still having quite a bit of pain intermittently and wants to ask Dr Cleta Alberts what the next step should be. Called pt to advise that Dr Cleta Alberts is not back in office until tomorrow and she reports that her upper R shoulder area hurts all the time, but a couple days a week she has terrible problems w/muscle spasms esp right under right shoulder blade in back/shoulder. She asks that he look at x ray/CT again to see if it shows anything that could be causing that.

## 2013-03-02 NOTE — Telephone Encounter (Signed)
The CT of the chest and x-rays of the ribs are all normal. These are all read by the radiologist. The next step is to see her again to see whether we need to do an MRI of the neck and thoracic spine or whether we should refer her to one of the specialists for evaluation.

## 2013-03-02 NOTE — Telephone Encounter (Signed)
Called her to advise.  

## 2013-03-15 ENCOUNTER — Other Ambulatory Visit: Payer: Self-pay

## 2013-03-15 DIAGNOSIS — Z1231 Encounter for screening mammogram for malignant neoplasm of breast: Secondary | ICD-10-CM

## 2013-03-25 ENCOUNTER — Ambulatory Visit
Admission: RE | Admit: 2013-03-25 | Discharge: 2013-03-25 | Disposition: A | Discharge status: Home / Self Care | Payer: Medicare Other | Source: Ambulatory Visit

## 2013-03-25 DIAGNOSIS — Z1231 Encounter for screening mammogram for malignant neoplasm of breast: Secondary | ICD-10-CM | POA: Diagnosis not present

## 2013-05-10 ENCOUNTER — Ambulatory Visit (INDEPENDENT_AMBULATORY_CARE_PROVIDER_SITE_OTHER): Payer: Medicare Other | Admitting: Emergency Medicine

## 2013-05-10 ENCOUNTER — Ambulatory Visit: Payer: Medicare Other

## 2013-05-10 ENCOUNTER — Telehealth: Payer: Self-pay

## 2013-05-10 VITALS — BP 116/60 | HR 92 | Temp 98.3°F | Resp 16 | Ht 63.5 in | Wt 126.0 lb

## 2013-05-10 DIAGNOSIS — M542 Cervicalgia: Secondary | ICD-10-CM

## 2013-05-10 DIAGNOSIS — M4120 Other idiopathic scoliosis, site unspecified: Secondary | ICD-10-CM

## 2013-05-10 DIAGNOSIS — M5090 Cervical disc disorder, unspecified, unspecified cervical region: Secondary | ICD-10-CM | POA: Diagnosis not present

## 2013-05-10 DIAGNOSIS — M419 Scoliosis, unspecified: Secondary | ICD-10-CM

## 2013-05-10 MED ORDER — CYCLOBENZAPRINE HCL 5 MG PO TABS: 5.0000 mg | ORAL_TABLET | Freq: Three times a day (TID) | ORAL | Status: DC | PRN

## 2013-05-10 MED ORDER — HYDROCODONE-ACETAMINOPHEN 5-325 MG PO TABS: 1.0000 | ORAL_TABLET | Freq: Four times a day (QID) | ORAL | Status: DC | PRN

## 2013-05-10 NOTE — Progress Notes (Signed)
Subjective:    Patient ID: Dawn Powers, female    DOB: [birth year], 67 y.o.   MRN: [id number]  HPI since her last visit here patient has had persistent intermittent severe spasm in her back. This seems localized over the right scapular area. She denies any radicular symptoms down her arm. She had thoracic spine films done on her last visit and does have a significant kyphoscoliosis with degenerative disc disease. She had a CT chest on which did not reveal any acute abnormalities or bony destructive lesions. She continues to get acupuncture and this does help some. She rarely takes a hydrocodone and a muscle relaxant.    Review of Systems     Objective:   Physical Exam there is no weakness of the neck. She has good range of motion. There is tenderness along the right periscapular area. Motor strength of the upper extremities 2+. Her lungs are clear to  Results for orders placed in visit on 12/22/12  CBC      Result Value Range   WBC 7.2  4.0 - 10.5 K/uL   RBC 4.50  3.87 - 5.11 MIL/uL   Hemoglobin 13.9  12.0 - 15.0 g/dL   HCT 46.9  62.9 - 52.8 %   MCV 89.3  78.0 - 100.0 fL   MCH 30.9  26.0 - 34.0 pg   MCHC 34.6  30.0 - 36.0 g/dL   RDW 41.3  24.4 - 01.0 %   Platelets 220  150 - 400 K/uL  COMPREHENSIVE METABOLIC PANEL      Result Value Range   Sodium 142  135 - 145 mEq/L   Potassium 4.4  3.5 - 5.3 mEq/L   Chloride 104  96 - 112 mEq/L   CO2 32  19 - 32 mEq/L   Glucose, Bld 85  70 - 99 mg/dL   BUN 22  6 - 23 mg/dL   Creat 2.72  5.36 - 6.44 mg/dL   Total Bilirubin 0.6  0.3 - 1.2 mg/dL   Alkaline Phosphatase 45  39 - 117 U/L   AST 21  0 - 37 U/L   ALT 17  0 - 35 U/L   Total Protein 6.3  6.0 - 8.3 g/dL   Albumin 4.3  3.5 - 5.2 g/dL   Calcium 9.7  8.4 - 03.4 mg/dL  TSH      Result Value Range   TSH 2.467  0.350 - 4.500 uIU/mL  LIPID PANEL      Result Value Range   Cholesterol 207 (*) 0 - 200 mg/dL   Triglycerides 66  <742 mg/dL   HDL 77  >59 mg/dL   Total CHOL/HDL Ratio 2.7      VLDL 13  0 - 40 mg/dL   LDL Cholesterol 563 (*) 0 - 99 mg/dL  POCT URINALYSIS DIPSTICK      Result Value Range   Color, UA yellow     Clarity, UA clear     Glucose, UA neg     Bilirubin, UA neg     Ketones, UA trace     Spec Grav, UA 1.020     Blood, UA neg     pH, UA 7.0     Protein, UA neg     Urobilinogen, UA 0.2     Nitrite, UA neg     Leukocytes, UA Negative    POCT UA - MICROSCOPIC ONLY      Result Value Range   WBC, Ur, HPF, POC neg  RBC, urine, microscopic neg     Bacteria, U Microscopic neg     Mucus, UA trace     Epithelial cells, urine per micros 0-5     Crystals, Ur, HPF, POC neg     Casts, Ur, LPF, POC neg     Yeast, UA neg     [facility] reading (PRIMARY) by  Dr. Cleta Alberts is C5-6 and C6-7 degenerative disease. There also appears to be 2 mm retrolisthesis of 2 of the mid cervical vertebrae. Please comment the C3-C4 and C4-C5       Assessment & Plan:  Meds were refilled. MRI of the thoracic spine and cervical spine were done. I introduced Dr. Dallas Schimke to the patient. She will see the patient in followup after the MRI is done .

## 2013-05-10 NOTE — Telephone Encounter (Signed)
PT WOULD LIKE TO BE SCHEDULED FOR AN MRI ASAP. STATES DR DAUB HAD MENTIONED BEFORE, HE WANTED HER TO HAVE ONE PLEASE CALL HER WORK AT [phone] OR HER CELL AT [phone]

## 2013-05-10 NOTE — Telephone Encounter (Signed)
We need to see her again, prior to scheduling. She is advised. Plans to come in today.

## 2013-05-12 ENCOUNTER — Telehealth: Payer: Self-pay

## 2013-05-12 NOTE — Telephone Encounter (Signed)
Patient would like to know if she has ever had an mri please call her at [phone]

## 2013-05-13 NOTE — Telephone Encounter (Signed)
Called her. She did have an MRI of her lumbar spine in 2010. Have reviewed report with her. She is asking if this is sufficient, advised her no, MRI ordered is her neck/ t spine not lumbar.

## 2013-05-13 NOTE — Telephone Encounter (Signed)
Patient has questions for Dr. Clelia Croft regarding upcoming MRI on Saturday   [phone]

## 2013-05-15 ENCOUNTER — Ambulatory Visit
Admission: RE | Admit: 2013-05-15 | Discharge: 2013-05-15 | Disposition: A | Discharge status: Home / Self Care | Payer: Medicare Other | Source: Ambulatory Visit | Attending: Emergency Medicine | Admitting: Emergency Medicine

## 2013-05-15 DIAGNOSIS — M419 Scoliosis, unspecified: Secondary | ICD-10-CM

## 2013-05-15 DIAGNOSIS — M5090 Cervical disc disorder, unspecified, unspecified cervical region: Secondary | ICD-10-CM

## 2013-05-15 DIAGNOSIS — M5124 Other intervertebral disc displacement, thoracic region: Secondary | ICD-10-CM | POA: Diagnosis not present

## 2013-05-15 DIAGNOSIS — M4712 Other spondylosis with myelopathy, cervical region: Secondary | ICD-10-CM | POA: Diagnosis not present

## 2013-05-15 DIAGNOSIS — M47812 Spondylosis without myelopathy or radiculopathy, cervical region: Secondary | ICD-10-CM | POA: Diagnosis not present

## 2013-05-15 DIAGNOSIS — M4802 Spinal stenosis, cervical region: Secondary | ICD-10-CM | POA: Diagnosis not present

## 2013-05-19 ENCOUNTER — Telehealth: Payer: Self-pay

## 2013-05-19 NOTE — Telephone Encounter (Signed)
Please review for Dr Cleta Alberts

## 2013-05-19 NOTE — Telephone Encounter (Signed)
Called pt and tried to explain her report. She had a lot of questions I cannot answer. She is going to come in to see Dr Clelia Croft tomorrow morning to discuss.

## 2013-05-19 NOTE — Telephone Encounter (Signed)
Patient is calling to get the results of her MRI. Please call [phone]. If call is made after 11:00am please call [phone]. Patient also left home number [phone]. Patient states that if she is unable to answer leaving a detailed message on either number is OK.

## 2013-05-19 NOTE — Telephone Encounter (Signed)
See RESULT NOTE.  She does have changes in the spine that could be the cause of her pain. Per Dr. Ellis Parents note, she was going to follow-up with Dr. Clelia Croft in his absence. I have forwarded the MRI reports to her.

## 2013-05-20 ENCOUNTER — Encounter: Payer: Self-pay | Admitting: Radiology

## 2013-05-20 ENCOUNTER — Ambulatory Visit (INDEPENDENT_AMBULATORY_CARE_PROVIDER_SITE_OTHER): Payer: Medicare Other | Admitting: Family Medicine

## 2013-05-20 VITALS — BP 166/80 | HR 60 | Resp 18

## 2013-05-20 DIAGNOSIS — M47814 Spondylosis without myelopathy or radiculopathy, thoracic region: Secondary | ICD-10-CM

## 2013-05-20 DIAGNOSIS — M899 Disorder of bone, unspecified: Secondary | ICD-10-CM

## 2013-05-20 DIAGNOSIS — IMO0002 Reserved for concepts with insufficient information to code with codable children: Secondary | ICD-10-CM

## 2013-05-20 DIAGNOSIS — M4120 Other idiopathic scoliosis, site unspecified: Secondary | ICD-10-CM

## 2013-05-20 DIAGNOSIS — M5030 Other cervical disc degeneration, unspecified cervical region: Secondary | ICD-10-CM | POA: Diagnosis not present

## 2013-05-20 DIAGNOSIS — M5134 Other intervertebral disc degeneration, thoracic region: Secondary | ICD-10-CM

## 2013-05-20 DIAGNOSIS — M8580 Other specified disorders of bone density and structure, unspecified site: Secondary | ICD-10-CM

## 2013-05-20 NOTE — Progress Notes (Signed)
Subjective:    Patient ID: Dawn Powers, female    DOB: June 15, 1946, 67 y.o.   MRN: [id number]  HPI   Dawn Powers is doing well. She just had an MRI of her cervical and thoracic spine done and is here to review the results in detail.   She was taking ca supp in the past but stopped as she was concerned they were hard on her kidneys and wanted to know if she should restart. She does a lot of yoga - especially finds relief with extension of her c/t/l spine - sometimes using a exercise ball to roll back on. Had been seeing a chiropractor which seemed to help a lot - he does not do adjustments on her though but she finds a lot of relief from TENS type electrical stimulation. She saw Mr. Dawn Powers with [facility] many years ago and found it very helpful - would like to go back.  It is difficult to find PT that works with her medicare through - integrative therapies does not. Still getting acupuncture which seems to help  A lot - last time they connected electricity to it. Gets sig relief from taking ibuprofen 200mg  or naproxen 250 mg and walking while maintaining good posture.  She usually take a single dose of ibuprofen or naproxen 2 times a week but somtimes uses a single dose daily for several days in a row.   Past Medical History  Diagnosis Date  . Hyperlipidemia   . Arthritis   . Heart murmur    Past Surgical History  Procedure Laterality Date  . Fracture surgery     Current Outpatient Prescriptions on File Prior to Visit  Medication Sig Dispense Refill  . calcium & magnesium carbonates (MYLANTA) 311-232 MG per tablet Take 1 tablet by mouth daily.      . cyclobenzaprine (FLEXERIL) 5 MG tablet Take 1 tablet (5 mg total) by mouth 3 (three) times daily as needed for muscle spasms.  30 tablet  1  . doxycycline (VIBRA-TABS) 100 MG tablet Take 1 tablet (100 mg total) by mouth 2 (two) times daily.  20 tablet  0  . glucosamine-chondroitin 500-400 MG tablet Take 1 tablet by mouth 3 (three) times  daily.      Marland Kitchen guaiFENesin (MUCINEX) 600 MG 12 hr tablet Take 1,200 mg by mouth 2 (two) times daily.      Marland Kitchen HYDROcodone-acetaminophen (NORCO) 5-325 MG per tablet Take 1 tablet by mouth every 6 (six) hours as needed for pain.  30 tablet  0  . magnesium 30 MG tablet Take 30 mg by mouth 2 (two) times daily.      Marland Kitchen Propylene Glycol (SYSTANE BALANCE OP) Apply to eye 3 (three) times daily.       No current facility-administered medications on file prior to visit.   Allergies  Allergen Reactions  . Morphine And Related Hives      Review of Systems  Constitutional: Negative for fever and chills.  HENT: Negative for neck pain and neck stiffness.   Gastrointestinal: Negative for nausea, vomiting, abdominal pain, diarrhea and constipation.  Genitourinary: Negative for urgency, frequency, decreased urine volume and difficulty urinating.  Musculoskeletal: Positive for myalgias, back pain and arthralgias. Negative for joint swelling and gait problem.  Neurological: Negative for weakness and numbness.  Hematological: Negative for adenopathy. Does not bruise/bleed easily.      BP 166/80  Pulse 60  Resp 18 Objective:   Physical Exam  Constitutional: She is oriented to person,  place, and time. She appears well-developed and well-nourished. No distress.  HENT:  Head: Normocephalic and atraumatic.  Right Ear: External ear normal.  Eyes: Conjunctivae are normal. No scleral icterus.  Pulmonary/Chest: Effort normal.  Musculoskeletal:       Thoracic back: She exhibits spasm.  Right rhomboid lower muscle spasm  Neurological: She is alert and oriented to person, place, and time.  Skin: Skin is warm and dry. She is not diaphoretic. No erythema.  Psychiatric: She has a normal mood and affect. Her behavior is normal.      Assessment & Plan:  Scoliosis (and kyphoscoliosis), idiopathic - Plan: Ambulatory referral to Physical Therapy  Osteopenia - Plan: Ambulatory referral to Physical Therapy - restart  daily calcium supplement of 500mg  chewable in addition to dietary calcium intake. Cont vit D.  Degenerative disc disease, thoracic - Plan: Ambulatory referral to Physical Therapy  Degeneration of cervical intervertebral disc - Plan: Ambulatory referral to Physical Therapy  Thoracic spondylosis without myelopathy - Plan: Ambulatory referral to Physical Therapy - Ok to cont acupuncture, ok to f/u w/ chiropracter if no manipulations are done, ok to continue yoga, ok to cont prn otc nsaids.  Elev BP - pain and anxiety - will cont to check at home but no prior h/o prob w/ BP  Over 25 minutes spent in face-to-face discussion and council with pt regarding medical problems and concerns.

## 2013-06-29 DIAGNOSIS — Z23 Encounter for immunization: Secondary | ICD-10-CM | POA: Diagnosis not present

## 2013-06-30 ENCOUNTER — Telehealth: Payer: Self-pay

## 2013-06-30 NOTE — Telephone Encounter (Signed)
PT STATES SHE AND HER HUSBAND ANTHONY ARE GOING TO [place] AND WANTS THEIR IMMUNIZATION RECORDS REVIEWED ACCORDINGLY.   BEST PHONE FOR PT IS [phone]

## 2013-07-01 NOTE — Telephone Encounter (Signed)
The only vaccine in epic is flu, will pull chart.

## 2013-07-02 ENCOUNTER — Telehealth: Payer: Self-pay

## 2013-07-02 NOTE — Telephone Encounter (Addendum)
Left message for her to call me back td was 01/27/2009 and pneumovac was 01/02/2010 what specifically was she looking for?

## 2013-07-02 NOTE — Telephone Encounter (Signed)
Patient is returning call to Amy. States that if the message is something that can left of VM it ok to leave it there.  [phone]

## 2013-07-02 NOTE — Telephone Encounter (Signed)
Ok. Thanks/ see other phone message.

## 2013-07-04 NOTE — Telephone Encounter (Signed)
Pt and her husband are traveling to [place] and want to know what vaccines they would need. Can we help with this? Told her they may need to come in, but she states that they were both just here for full physicals.

## 2013-07-05 ENCOUNTER — Other Ambulatory Visit: Payer: Self-pay | Admitting: Emergency Medicine

## 2013-07-05 DIAGNOSIS — M549 Dorsalgia, unspecified: Secondary | ICD-10-CM

## 2013-07-05 NOTE — Telephone Encounter (Signed)
Travel to [place] and [place] does not require any special vaccinations.  It's generally recommended that all travelers receive the hep A vaccine if they have not received it.  We stock the vaccine and would be happy to administer it if she and her husband are interested

## 2013-07-06 NOTE — Telephone Encounter (Signed)
Thank you. I have called her to advise, she has not had the TDap either, she should have this also.

## 2013-07-09 ENCOUNTER — Ambulatory Visit (INDEPENDENT_AMBULATORY_CARE_PROVIDER_SITE_OTHER): Payer: Medicare Other | Admitting: Family Medicine

## 2013-07-09 ENCOUNTER — Encounter: Payer: Self-pay | Admitting: Family Medicine

## 2013-07-09 VITALS — BP 137/70 | HR 71 | Temp 98.4°F | Resp 16 | Ht 63.5 in | Wt 127.0 lb

## 2013-07-09 DIAGNOSIS — Z283 Underimmunization status: Secondary | ICD-10-CM

## 2013-07-09 DIAGNOSIS — Z130 Encounter for screening for diseases of the blood and blood-forming organs and certain disorders involving the immune mechanism: Secondary | ICD-10-CM | POA: Diagnosis not present

## 2013-07-09 DIAGNOSIS — Z23 Encounter for immunization: Secondary | ICD-10-CM

## 2013-07-09 DIAGNOSIS — Z205 Contact with and (suspected) exposure to viral hepatitis: Secondary | ICD-10-CM

## 2013-07-09 DIAGNOSIS — Z7189 Other specified counseling: Secondary | ICD-10-CM | POA: Diagnosis not present

## 2013-07-09 DIAGNOSIS — Z2839 Other underimmunization status: Secondary | ICD-10-CM

## 2013-07-09 DIAGNOSIS — Z7184 Encounter for health counseling related to travel: Secondary | ICD-10-CM

## 2013-07-09 DIAGNOSIS — Z20828 Contact with and (suspected) exposure to other viral communicable diseases: Secondary | ICD-10-CM | POA: Diagnosis not present

## 2013-07-09 DIAGNOSIS — Z1329 Encounter for screening for other suspected endocrine disorder: Secondary | ICD-10-CM

## 2013-07-09 DIAGNOSIS — Z789 Other specified health status: Secondary | ICD-10-CM

## 2013-07-09 NOTE — Progress Notes (Signed)
Subjective:    Patient ID: Dawn Powers, female    DOB: [birth year], 67 y.o.   MRN: [id number] Chief Complaint  Patient presents with  . Immunizations    FOR OVERSEAS    HPI  Leaving in 6d for a 2 wk trip around [place] and [place] with her husband. Going to be staying in old monasteries and armories. Has never travelled outside the [place] much before so does not think she has ever been immunized against hepatitis A or B. Did get her td in 12/2008 - but did not have the pertussis component. Did get pneumovax after she turned 63 in 2011.  Past Medical History  Diagnosis Date  . Hyperlipidemia   . Arthritis   . Heart murmur    Current Outpatient Prescriptions on File Prior to Visit  Medication Sig Dispense Refill  . cyclobenzaprine (FLEXERIL) 5 MG tablet Take 1 tablet (5 mg total) by mouth 3 (three) times daily as needed for muscle spasms.  30 tablet  1  . glucosamine-chondroitin 500-400 MG tablet Take 1 tablet by mouth 3 (three) times daily.      Marland Kitchen HYDROcodone-acetaminophen (NORCO) 5-325 MG per tablet Take 1 tablet by mouth every 6 (six) hours as needed for pain.  30 tablet  0  . magnesium 30 MG tablet Take 30 mg by mouth 2 (two) times daily.      Marland Kitchen Propylene Glycol (SYSTANE BALANCE OP) Apply to eye 3 (three) times daily.      . calcium & magnesium carbonates (MYLANTA) 311-232 MG per tablet Take 1 tablet by mouth daily.      Marland Kitchen doxycycline (VIBRA-TABS) 100 MG tablet Take 1 tablet (100 mg total) by mouth 2 (two) times daily.  20 tablet  0  . guaiFENesin (MUCINEX) 600 MG 12 hr tablet Take 1,200 mg by mouth 2 (two) times daily.       No current facility-administered medications on file prior to visit.   Allergies  Allergen Reactions  . Morphine And Related Hives   Immunization History  Administered Date(s) Administered  . Hepatitis A, Adult 07/09/2013  . Hepatitis B, adult/adol-2 dose 07/09/2013  . Influenza Split 07/30/2012  . Meningococcal Polysaccharide 01/02/2010  . Td 01/27/2009      Review of Systems  Constitutional: Negative for fever, chills, diaphoresis and appetite change.  Respiratory: Negative for shortness of breath.   Cardiovascular: Negative for chest pain, palpitations and leg swelling.  Gastrointestinal: Negative for vomiting, abdominal pain and abdominal distention.  Musculoskeletal: Positive for arthralgias and back pain.  Skin: Negative for rash and wound.  Hematological: Negative for adenopathy. Does not bruise/bleed easily.      BP 137/70  Pulse 71  Temp(Src) 98.4 F (36.9 C) (Oral)  Resp 16  Ht 5' 3.5" (1.613 m)  Wt 127 lb (57.607 kg)  BMI 22.14 kg/m2  SpO2 98% Objective:   Physical Exam  Constitutional: She is oriented to person, place, and time. She appears well-developed and well-nourished. No distress.  HENT:  Head: Normocephalic and atraumatic.  Right Ear: External ear normal.  Eyes: Conjunctivae are normal. No scleral icterus.  Pulmonary/Chest: Effort normal.  Neurological: She is alert and oriented to person, place, and time.  Skin: Skin is warm and dry. She is not diaphoretic. No erythema.  Psychiatric: She has a normal mood and affect. Her behavior is normal.          Assessment & Plan:  Need for prophylactic vaccination and inoculation against viral hepatitis - Plan: Hepatitis A  vaccine adult IM, Hepatitis B vaccine adult IM  Travel advice encounter  Hepatitis A vaccination not up to date  Need for hepatitis A immunization  Exposure to hepatitis A  Reviewed CDC recommendations for travel to [place] and [place] and discussed risks/benefits w/ pt and her husband.  Will have potential exposure to hepatitis while traveling - esp hep A by food - so Hep A and B vaccinations given today both #1 which should cover her adequately during her upcoming travel.  Rec RTC in 2 mos for 2nd Hep B and in 6 mos for 2nd Hep A and 3rd Hep B to complete course in case pt does additional travel in the future.    Norberto Sorenson, MD MPH

## 2013-07-09 NOTE — Patient Instructions (Signed)
95% of people have immunity to hepatitis A 1 month after vaccination but immunity often starts developing within 2 wks.  You should definitely NOT get a fever from this vaccine so return to clinic immediately if you do.  If you get your second Hepatitis A shot in 6 - 12 months, you should be confident that you now have life long immunity.  Immunity to Hepatitis B cannot be counted on until you have received all 3 doses (the second dose given 1-2 months after the first and the 3rd dose given 6 months after the first).  However, there is good evidence that even partial hepatitis B immunization may provide some people immunity.

## 2013-08-01 ENCOUNTER — Telehealth: Payer: Self-pay

## 2013-08-01 NOTE — Telephone Encounter (Signed)
Patient is needing a copy of her x-rays to take to the orthopedic appointment.    Best#:  [phone]

## 2013-08-02 DIAGNOSIS — M546 Pain in thoracic spine: Secondary | ICD-10-CM | POA: Diagnosis not present

## 2013-08-02 NOTE — Telephone Encounter (Signed)
Patient called again about x-rays. She had a disc ready for pick up but it didn't have her thoracic spine x-rays on it, just her cervical spine. A second disc was made today with thoracic spine. Left message for patient to pick up both discs for ortho appt today.

## 2013-08-04 ENCOUNTER — Ambulatory Visit: Payer: Medicare Other

## 2013-08-05 ENCOUNTER — Other Ambulatory Visit: Payer: Self-pay

## 2013-08-05 ENCOUNTER — Ambulatory Visit (INDEPENDENT_AMBULATORY_CARE_PROVIDER_SITE_OTHER): Payer: Medicare Other | Admitting: Family Medicine

## 2013-08-05 VITALS — BP 132/74 | HR 84 | Temp 98.3°F | Resp 20 | Ht 64.5 in | Wt 126.0 lb

## 2013-08-05 DIAGNOSIS — R197 Diarrhea, unspecified: Secondary | ICD-10-CM | POA: Diagnosis not present

## 2013-08-05 LAB — POCT CBC
Granulocyte percent: 60.9 %G (ref 37–80)
HCT, POC: 44.5 % (ref 37.7–47.9)
Hemoglobin: 14.3 g/dL (ref 12.2–16.2)
Lymph, poc: 0.9 (ref 0.6–3.4)
MCH, POC: 31.9 pg — AB (ref 27–31.2)
MCHC: 32.1 g/dL (ref 31.8–35.4)
MCV: 99.4 fL — AB (ref 80–97)
MID (cbc): 0.3 (ref 0–0.9)
MPV: 8.4 fL (ref 0–99.8)
POC Granulocyte: 1.9 — AB (ref 2–6.9)
POC LYMPH PERCENT: 29.2 %L (ref 10–50)
POC MID %: 9.9 %M (ref 0–12)
Platelet Count, POC: 260 10*3/uL (ref 142–424)
RBC: 4.48 M/uL (ref 4.04–5.48)
RDW, POC: 12.6 %
WBC: 3.1 10*3/uL — AB (ref 4.6–10.2)

## 2013-08-05 LAB — COMPREHENSIVE METABOLIC PANEL
ALT: 13 U/L (ref 0–35)
AST: 20 U/L (ref 0–37)
Albumin: 4.5 g/dL (ref 3.5–5.2)
Alkaline Phosphatase: 57 U/L (ref 39–117)
BUN: 14 mg/dL (ref 6–23)
CO2: 25 mEq/L (ref 19–32)
Calcium: 9.6 mg/dL (ref 8.4–10.5)
Chloride: 102 mEq/L (ref 96–112)
Creat: 0.81 mg/dL (ref 0.50–1.10)
Glucose, Bld: 84 mg/dL (ref 70–99)
Potassium: 4 mEq/L (ref 3.5–5.3)
Sodium: 140 mEq/L (ref 135–145)
Total Bilirubin: 0.7 mg/dL (ref 0.3–1.2)
Total Protein: 6.7 g/dL (ref 6.0–8.3)

## 2013-08-05 MED ORDER — CIPROFLOXACIN HCL 500 MG PO TABS: 500.0000 mg | ORAL_TABLET | Freq: Two times a day (BID) | ORAL | Status: DC

## 2013-08-05 NOTE — Progress Notes (Addendum)
[facility] [address], [place] [address] [phone]  Date:  08/05/2013   Name:  Dawn Powers   DOB:  1945/12/26   MRN:  [id number]  PCP:  Lucilla Edin, MD    Chief Complaint: Abdominal Pain   History of Present Illness:  Dawn Powers is a 67 y.o. very pleasant female patient who presents with the following:  History of scoliosis and osteopenia.  Last seen by myself in April after a tick bite.   She was seen last month prior to a trip to [place] and [place]. At that time she was vaccinated against Hep A and Hep B.   On he last night of their trip (which was exactly one week ago) her husband started to feel ill with nausea.  He has gotten better.  She was up all night that evening with diarrhea.  That day/ the day prior she had taken muscle relaxer, hydrocodone and ibuprofen due to increased back pain.  She made it home ok and did not have trouble on the plane.  She has continued to have abnormal smelling stools.  If she eats, she will have cramping, bloating and discomfort.  She has had loose stools all week, but has not had bad diarrhea again She has never run a fever during this illness.  She has noted chills She has felt tired and has needed to sleep more. She has not been nauseated or vomited.  She has not noted any blood in her stool.  It is not dark or tarry.  She does see some mucus in her stool.    She has not been eating much due to her sx.  She is hydrating however and is urinating normallly She is maybe having 3 stools a day at this time.    Patient Active Problem List   Diagnosis Date Noted  . Scoliosis (and kyphoscoliosis), idiopathic 12/22/2012  . Osteopenia 12/22/2012  . Other and unspecified hyperlipidemia 12/22/2012    Past Medical History  Diagnosis Date  . Hyperlipidemia   . Arthritis   . Heart murmur     Past Surgical History  Procedure Laterality Date  . Fracture surgery      History  Substance Use Topics  .  Smoking status: Former Smoker -- 4 years    Types: Cigarettes    Quit date: 09/30/2001  . Smokeless tobacco: Not on file  . Alcohol Use: Yes     Comment: [employer]    Family History  Problem Relation Age of Onset  . Cancer Mother   . Heart disease Mother   . Alcohol abuse Mother   . Dementia Mother   . Cancer Father   . Alcohol abuse Father   . Diabetes Brother   . Alcohol abuse Maternal Grandfather     Allergies  Allergen Reactions  . Morphine And Related Hives    Medication list has been reviewed and updated.  Current Outpatient Prescriptions on File Prior to Visit  Medication Sig Dispense Refill  . calcium & magnesium carbonates (MYLANTA) 311-232 MG per tablet Take 1 tablet by mouth daily.      . cyclobenzaprine (FLEXERIL) 5 MG tablet Take 1 tablet (5 mg total) by mouth 3 (three) times daily as needed for muscle spasms.  30 tablet  1  . doxycycline (VIBRA-TABS) 100 MG tablet Take 1 tablet (100 mg total) by mouth 2 (two) times daily.  20 tablet  0  . glucosamine-chondroitin 500-400  MG tablet Take 1 tablet by mouth 3 (three) times daily.      Marland Kitchen guaiFENesin (MUCINEX) 600 MG 12 hr tablet Take 1,200 mg by mouth 2 (two) times daily.      Marland Kitchen HYDROcodone-acetaminophen (NORCO) 5-325 MG per tablet Take 1 tablet by mouth every 6 (six) hours as needed for pain.  30 tablet  0  . magnesium 30 MG tablet Take 30 mg by mouth 2 (two) times daily.      Marland Kitchen Propylene Glycol (SYSTANE BALANCE OP) Apply to eye 3 (three) times daily.       No current facility-administered medications on file prior to visit.    Review of Systems:  As per HPI- otherwise negative.   Physical Examination: Filed Vitals:   08/05/13 0807  BP: 132/74  Pulse: 84  Temp: 98.3 F (36.8 C)  Resp: 20   Filed Vitals:   08/05/13 0807  Height: 5' 4.5" (1.638 m)  Weight: 126 lb (57.153 kg)   Body mass index is 21.3 kg/(m^2). Ideal Body Weight: Weight in (lb) to have BMI = 25: 147.6  GEN: WDWN, NAD,  Non-toxic, A & O x 3, slim build, looks well HEENT: Atraumatic, Normocephalic. Neck supple. No masses, No LAD. Ears and Nose: No external deformity. CV: RRR, No M/G/R. No JVD. No thrill. No extra heart sounds. PULM: CTA B, no wheezes, crackles, rhonchi. No retractions. No resp. distress. No accessory muscle use. ABD: S, NT, ND, +BS. No rebound. No HSM.  Benign exam EXTR: No c/c/e NEURO Normal gait.  PSYCH: Normally interactive. Conversant. Not depressed or anxious appearing.  Calm demeanor.   Wt Readings from Last 3 Encounters:  08/05/13 126 lb (57.153 kg)  07/09/13 127 lb (57.607 kg)  05/10/13 126 lb (57.153 kg)   Results for orders placed in visit on 08/05/13  POCT CBC      Result Value Range   WBC 3.1 (*) 4.6 - 10.2 K/uL   Lymph, poc 0.9  0.6 - 3.4   POC LYMPH PERCENT 29.2  10 - 50 %L   MID (cbc) 0.3  0 - 0.9   POC MID % 9.9  0 - 12 %M   POC Granulocyte 1.9 (*) 2 - 6.9   Granulocyte percent 60.9  37 - 80 %G   RBC 4.48  4.04 - 5.48 M/uL   Hemoglobin 14.3  12.2 - 16.2 g/dL   HCT, POC 96.0  45.4 - 47.9 %   MCV 99.4 (*) 80 - 97 fL   MCH, POC 31.9 (*) 27 - 31.2 pg   MCHC 32.1  31.8 - 35.4 g/dL   RDW, POC 09.8     Platelet Count, POC 260  142 - 424 K/uL   MPV 8.4  0 - 99.8 fL    Assessment and Plan: Diarrhea - Plan: POCT CBC, Comprehensive metabolic panel, ciprofloxacin (CIPRO) 500 MG tablet, Stool culture  Likely multifactorial GI upset; increased medication use, travel, unusual foods and I suspect viral diarrhea.  She does not want to take abx unless absolutely necessary.  At this time she can certainly try OTC imodium and a light, non- dairy diet (she has been using kefir and yogurt this week in an attempt to help her GI system).  If her sx do not continue to abate she will collect a stool sample and start cipro rx.    Will plan further follow- up pending labs.   Signed Abbe Amsterdam, MD  11/8- called with her labs.  LMOM that  metabolic profile looks good, I would  like to check her CBC as a lab visit only in the next 1-2 weeks.  Will leave an order on her chart  Results for orders placed in visit on 08/05/13  COMPREHENSIVE METABOLIC PANEL      Result Value Range   Sodium 140  135 - 145 mEq/L   Potassium 4.0  3.5 - 5.3 mEq/L   Chloride 102  96 - 112 mEq/L   CO2 25  19 - 32 mEq/L   Glucose, Bld 84  70 - 99 mg/dL   BUN 14  6 - 23 mg/dL   Creat 1.61  0.96 - 0.45 mg/dL   Total Bilirubin 0.7  0.3 - 1.2 mg/dL   Alkaline Phosphatase 57  39 - 117 U/L   AST 20  0 - 37 U/L   ALT 13  0 - 35 U/L   Total Protein 6.7  6.0 - 8.3 g/dL   Albumin 4.5  3.5 - 5.2 g/dL   Calcium 9.6  8.4 - 40.9 mg/dL  POCT CBC      Result Value Range   WBC 3.1 (*) 4.6 - 10.2 K/uL   Lymph, poc 0.9  0.6 - 3.4   POC LYMPH PERCENT 29.2  10 - 50 %L   MID (cbc) 0.3  0 - 0.9   POC MID % 9.9  0 - 12 %M   POC Granulocyte 1.9 (*) 2 - 6.9   Granulocyte percent 60.9  37 - 80 %G   RBC 4.48  4.04 - 5.48 M/uL   Hemoglobin 14.3  12.2 - 16.2 g/dL   HCT, POC 81.1  91.4 - 47.9 %   MCV 99.4 (*) 80 - 97 fL   MCH, POC 31.9 (*) 27 - 31.2 pg   MCHC 32.1  31.8 - 35.4 g/dL   RDW, POC 78.2     Platelet Count, POC 260  142 - 424 K/uL   MPV 8.4  0 - 99.8 fL

## 2013-08-05 NOTE — Patient Instructions (Addendum)
I will be in touch with the rest of your labs.  If you do not continue to feel better over the next few days please let me know- Sooner if worse.   If you continue to have symptoms please collect a stool sample and then start the cipro.    You can try some imodium OTC for your diarrhea.

## 2013-08-07 ENCOUNTER — Encounter: Payer: Self-pay | Admitting: Family Medicine

## 2013-08-07 DIAGNOSIS — R197 Diarrhea, unspecified: Secondary | ICD-10-CM | POA: Diagnosis not present

## 2013-08-07 NOTE — Addendum Note (Signed)
Addended by: Abbe Amsterdam C on: 08/07/2013 01:23 PM   Modules accepted: Orders

## 2013-08-09 NOTE — Telephone Encounter (Signed)
Called her back- see last OV.  She thought that she was getting better, and tried to eat more over the weekend and had symptoms again.  I would not recommend taking charcoal tablets.  She is ok as long as she sticks to a "tea and toast" type diet.  However, if she eats a more challenging diet she may have more loose stools.  Overall she is better and severe diarrhea is resolved. Encouraged her to stay the course and let me know if getting worse or not continuing to slowly improve.  She does not want to take the cipro- this is fine Plan repeat CBC in a few weeks- she will come in for a lab visit only

## 2013-08-09 NOTE — Telephone Encounter (Signed)
Please advise 

## 2013-08-09 NOTE — Telephone Encounter (Signed)
Patient would like to talk to dr copland about her gastro issues would like to know if she could take charcoal tablets,if she is contagious, and is she ok to go ahead and do her hep a. Also are her test results in ? Please call patient at [phone]

## 2013-08-10 ENCOUNTER — Encounter: Payer: Self-pay | Admitting: Family Medicine

## 2013-08-11 LAB — STOOL CULTURE

## 2013-08-16 ENCOUNTER — Other Ambulatory Visit (INDEPENDENT_AMBULATORY_CARE_PROVIDER_SITE_OTHER): Payer: Medicare Other | Admitting: *Deleted

## 2013-08-16 DIAGNOSIS — Z23 Encounter for immunization: Secondary | ICD-10-CM

## 2013-08-16 DIAGNOSIS — R197 Diarrhea, unspecified: Secondary | ICD-10-CM

## 2013-08-16 LAB — POCT CBC
Granulocyte percent: 58.4 %G (ref 37–80)
HCT, POC: 46.2 % (ref 37.7–47.9)
Hemoglobin: 14.4 g/dL (ref 12.2–16.2)
Lymph, poc: 1.7 (ref 0.6–3.4)
MCH, POC: 31.2 pg (ref 27–31.2)
MCHC: 31.2 g/dL — AB (ref 31.8–35.4)
MCV: 100 fL — AB (ref 80–97)
MID (cbc): 0.3 (ref 0–0.9)
MPV: 9 fL (ref 0–99.8)
POC Granulocyte: 2.9 (ref 2–6.9)
POC LYMPH PERCENT: 35.6 %L (ref 10–50)
POC MID %: 6 %M (ref 0–12)
Platelet Count, POC: 241 10*3/uL (ref 142–424)
RBC: 4.62 M/uL (ref 4.04–5.48)
RDW, POC: 13.2 %
WBC: 4.9 10*3/uL (ref 4.6–10.2)

## 2013-08-16 NOTE — Progress Notes (Signed)
Patient here for labs and 2nd Hep B vaccine only.

## 2013-08-17 DIAGNOSIS — Z01419 Encounter for gynecological examination (general) (routine) without abnormal findings: Secondary | ICD-10-CM | POA: Diagnosis not present

## 2013-08-17 DIAGNOSIS — Z0000 Encounter for general adult medical examination without abnormal findings: Secondary | ICD-10-CM | POA: Diagnosis not present

## 2013-08-18 ENCOUNTER — Other Ambulatory Visit: Payer: Self-pay | Admitting: Family Medicine

## 2013-08-18 DIAGNOSIS — D7589 Other specified diseases of blood and blood-forming organs: Secondary | ICD-10-CM

## 2013-08-19 ENCOUNTER — Encounter: Payer: Self-pay | Admitting: Family Medicine

## 2013-08-19 DIAGNOSIS — D7589 Other specified diseases of blood and blood-forming organs: Secondary | ICD-10-CM

## 2013-09-21 ENCOUNTER — Other Ambulatory Visit: Payer: Self-pay | Admitting: Emergency Medicine

## 2013-09-21 ENCOUNTER — Telehealth: Payer: Self-pay | Admitting: Emergency Medicine

## 2013-09-21 DIAGNOSIS — M549 Dorsalgia, unspecified: Secondary | ICD-10-CM

## 2013-09-21 NOTE — Telephone Encounter (Signed)
PT THINKS THAT SHE NEEDS REFERRAL TO NEUROLOGIST FOR HER BACK PAIN. SHE HAS BEEN TO ORTHO AND HAD AN MRI. YOU SPOKE TO HER IN THE OFFICE

## 2013-09-30 ENCOUNTER — Other Ambulatory Visit: Payer: Self-pay

## 2013-09-30 DIAGNOSIS — M542 Cervicalgia: Secondary | ICD-10-CM

## 2013-09-30 MED ORDER — HYDROCODONE-ACETAMINOPHEN 5-325 MG PO TABS: 1.0000 | ORAL_TABLET | Freq: Four times a day (QID) | ORAL | Status: DC | PRN

## 2013-09-30 NOTE — Telephone Encounter (Signed)
Pharm reqs Rf of hydrocodone. I have pended it for review.

## 2013-09-30 NOTE — Telephone Encounter (Signed)
Notified pt ready. 

## 2013-10-04 ENCOUNTER — Telehealth: Payer: Self-pay

## 2013-10-04 NOTE — Telephone Encounter (Signed)
Patient was wanting to ask Dr. Everlene Farrier; if he could possibly help bump her appointment time to the neurologist-( Dr. Alba Cory) he referred her too, at a sooner date than Jan. 20th. Patient says she is still in a lot of pain on her right side of spine where she is getting a lot of spasms. Please advise. Thank you!  Best: [phone]

## 2013-10-05 NOTE — Telephone Encounter (Signed)
We can call and try and move up the appointment. We can call the patient and let her note that at least we tried to move up the appointment. Please call that office and see if they have a cancellation call list

## 2013-10-05 NOTE — Telephone Encounter (Signed)
Please see my note and see if we can call and move up her appointment.

## 2013-10-05 NOTE — Telephone Encounter (Signed)
Patient called and stated she has already changed her appt to the 12th, so we can disregard her request.

## 2013-10-06 NOTE — Telephone Encounter (Signed)
Pt wanted to let you know  that she has an appt Monday with a Neurologist, and she also wanted to let you know that she is pain. She adds that she now believes that the pain is coming from her right shoulder and would like to know if we have taken any x-rays of her sright shoulder in the past.  Bets#

## 2013-10-11 ENCOUNTER — Encounter: Payer: Self-pay | Admitting: Neurology

## 2013-10-11 ENCOUNTER — Ambulatory Visit (INDEPENDENT_AMBULATORY_CARE_PROVIDER_SITE_OTHER): Payer: Medicare Other | Admitting: Neurology

## 2013-10-11 VITALS — BP 150/70 | HR 78 | Temp 97.8°F | Ht 63.5 in | Wt 132.0 lb

## 2013-10-11 DIAGNOSIS — M549 Dorsalgia, unspecified: Secondary | ICD-10-CM | POA: Diagnosis not present

## 2013-10-11 DIAGNOSIS — M4120 Other idiopathic scoliosis, site unspecified: Secondary | ICD-10-CM

## 2013-10-11 MED ORDER — BACLOFEN 10 MG PO TABS: ORAL_TABLET | ORAL | Status: DC

## 2013-10-11 NOTE — Patient Instructions (Addendum)
It sounds like the source of your pain is the muscle. 1.  Try baclofen.  Take 1/2 tablet three times daily.  If no relief in 7 days, then may increase to 1 full tablet three times daily. 2.  Discontinue cyclobenzaprine. 3.  Continue the meloxicam and hydrocodone for now.  If you notice improvement in pain while on baclofen, then start pulling back on the hydrocodone. 4.  Consider referral to Dr. Hulan Saas for potential osteopathic manipulative medicine. 5.  Follow up in 4 weeks.

## 2013-10-11 NOTE — Progress Notes (Signed)
NEUROLOGY CONSULTATION NOTE  JARIKA ROBBEN MRN: [id number] DOB: [birth year]  Referring provider: Dr. Everlene Farrier Primary care provider: Dr. Everlene Farrier  Reason for consult:  Back pain.  HISTORY OF PRESENT ILLNESS: Dawn Powers is a 68 year old right-handed woman with no significant past medical history who presents for back pain.  Records and images were personally reviewed where available.   She developed back pain about 8 to 9 months ago.  There was no preceding injury or strenuous activity.  She initially developed this squeezing burning type pain is located along the medial aspect of the right scapula.  Over time, she developed severe muscle spasms involving the right side of her mid to lower back.  Pain is intermittent.  Nothing really makes it worse.  Severity is worse in the morning and evening.  It is worse when standing up.  It is better with laying supine.  She describes an aching pain, that can reach 8-9/10 intensity.  There is no shooting pain from the neck radiating down the arms.  There is no numbness or tingling.  She has a desk job as a Network engineer and intermittently needs to lay down supine on the floor during the day.  It is causing a great deal of emotional stress.  She tried physical therapy and chiropractic therapy which were ineffective.  She has used a TENS unit, which is helpful.  She has seen a movement specialist/personal trainer, who has been helpful.  She was taking hydrocodone for the pain.  Ibuprofen caused stomach upset.  She was evaluated by orthopedics last month.  She was given Mobic and Flexeril, as well as a portable TENS unit.  The only thing that gives her relief is the hydrocodone  05/10/13 XR Cervical spine:  1.  Degenerative disc disease at C5-6 and C6-7.  2.  Multilevel facet joint degenerative changes with associated bilateral neural foraminal narrowing as described above.  Mild anterolisthesis of C3 on C4 and C4 on C5 is likely related to facet joint degenerative  change.  05/20/13 MRI Cervical spine:  Upper cervical facet degeneration left worse than right.  Foraminal narrowing due to osteophyte on the left at C3-4, possibly affecting left C4 nerve root.  Degenerative spondylosis at C5-6 and C6-7 with foraminal stenosis somewhat worse on the right at C5-6 and C6-7, with some potential for neural compression on the right at C6-7.  05/20/13 MRI Thoracic spine:  mild thoracic curvature convex to left wih evidence of osteoarthritis, disc herniations at T1-2, T2-3 and T4-5, as well as left-sided facet arthropathy at T9-10, T10-11 and T11-12.Marland Kitchen  PAST MEDICAL HISTORY: Past Medical History  Diagnosis Date  . Hyperlipidemia   . Arthritis   . Heart murmur     PAST SURGICAL HISTORY: Past Surgical History  Procedure Laterality Date  . Fracture surgery      ankle    MEDICATIONS: Current Outpatient Prescriptions on File Prior to Visit  Medication Sig Dispense Refill  . calcium & magnesium carbonates (MYLANTA) 311-232 MG per tablet Take 1 tablet by mouth daily.      . cyclobenzaprine (FLEXERIL) 5 MG tablet Take 1 tablet (5 mg total) by mouth 3 (three) times daily as needed for muscle spasms.  30 tablet  1  . glucosamine-chondroitin 500-400 MG tablet Take 1 tablet by mouth 3 (three) times daily.      Marland Kitchen guaiFENesin (MUCINEX) 600 MG 12 hr tablet Take 1,200 mg by mouth 2 (two) times daily.      Marland Kitchen  HYDROcodone-acetaminophen (NORCO) 5-325 MG per tablet Take 1 tablet by mouth every 6 (six) hours as needed.  30 tablet  0  . magnesium 30 MG tablet Take 30 mg by mouth 2 (two) times daily.      Marland Kitchen Propylene Glycol (SYSTANE BALANCE OP) Apply to eye 3 (three) times daily.       No current facility-administered medications on file prior to visit.    ALLERGIES: Allergies  Allergen Reactions  . Morphine And Related Hives    FAMILY HISTORY: Family History  Problem Relation Age of Onset  . Cancer Mother   . Heart disease Mother   . Alcohol abuse Mother   . Dementia  Mother   . Cancer Father   . Alcohol abuse Father   . Diabetes Brother   . Alcohol abuse Maternal Grandfather     SOCIAL HISTORY: History   Social History  . Marital Status: Married    Spouse Name: N/A    Number of Children: N/A  . Years of Education: N/A   Occupational History  . Not on file.   Social History Main Topics  . Smoking status: Former Smoker -- 4 years    Types: Cigarettes    Quit date: 09/30/2001  . Smokeless tobacco: Never Used  . 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 Drug Use: No  . Sexual Activity: Yes    Partners: Female    Patent examiner Protection: None   Other Topics Concern  . Not on file   Social History Narrative   Married. Education: college and Other. Exercise 4 times a week for 50 minutes.    REVIEW OF SYSTEMS: Constitutional: No fevers, chills, or sweats, no generalized fatigue, change in appetite Eyes: No visual changes, double vision, eye pain Ear, nose and throat: No hearing loss, ear pain, nasal congestion, sore throat Cardiovascular: No chest pain, palpitations Respiratory:  No shortness of breath at rest or with exertion, wheezes GastrointestinaI: No nausea, vomiting, diarrhea, abdominal pain, fecal incontinence Genitourinary:  No dysuria, urinary retention or frequency Musculoskeletal:  Back pain. Integumentary: No rash, pruritus, skin lesions Neurological: as above Psychiatric: Anxiety due to pain Endocrine: No palpitations, fatigue, diaphoresis, mood swings, change in appetite, change in weight, increased thirst Hematologic/Lymphatic:  No anemia, purpura, petechiae. Allergic/Immunologic: no itchy/runny eyes, nasal congestion, recent allergic reactions, rashes  PHYSICAL EXAM: Filed Vitals:   10/11/13 1043  BP: 150/70  Pulse: 78  Temp: 97.8 F (36.6 C)   General: No acute distress Head:  Normocephalic/atraumatic Neck: supple, no paraspinal tenderness, full range of motion Back: No paraspinal  tenderness (at this visit, she feels okay).  There is straightening of her kyphosis.   Heart: regular rate and rhythm Lungs: Clear to auscultation bilaterally. Vascular: No carotid bruits. Neurological Exam: Mental status: alert and oriented to person, place, and time, speech fluent and not dysarthric, language intact. Cranial nerves: CN I: not tested CN II: pupils equal, round and reactive to light, visual fields intact, fundi unremarkable. CN III, IV, VI:  full range of motion, no nystagmus, no ptosis CN V: facial sensation intact CN VII: upper and lower face symmetric CN VIII: hearing intact CN IX, X: gag intact, uvula midline CN XI: sternocleidomastoid and trapezius muscles intact CN XII: tongue midline Bulk & Tone: normal, no fasciculations. Motor: 5/5 throughout Sensation: temperature and vibration intact Deep Tendon Reflexes: 2+ throughout, toes down Finger to nose testing: no dysmetria or tremor Gait: normal stance and stride.  Able to walk on toes, heels and in tandem. Romberg negative.  IMPRESSION: Musculoskeletal back pain Muscle spasms.  PLAN: 1.  Will try baclofen 5mg  TID (may increase to 10mg  TID in one week if needed).  Stop cyclobenzaprine, since not effective. 2.  If notes improvement in pain, then pull back from the hydrocodone 3.  Consider referral to Dr. Hulan Saas from Sports Medicine, who performs osteopathic manipulative medicine. 4.  Follow up in 4 weeks.  Thank you for allowing me to take part in the care of this patient.  Metta Clines, DO  CC:  [name], MD

## 2013-10-21 ENCOUNTER — Ambulatory Visit: Payer: Medicare Other | Admitting: Neurology

## 2013-10-21 ENCOUNTER — Telehealth: Payer: Self-pay

## 2013-10-21 NOTE — Telephone Encounter (Signed)
I called and spoke with the patient. She has seen Dr. Lowry Ram D. Reuben Likes currently doing a TENS unit to meloxicam Flexeril and hydrocodone. She went to Dr. Tomi Likens neurologist at the [facility]  and had a good checkup. She is also currently having dry needle  treatments through [facility]. I have already called and spoken to patient. I advised her to stay on hydrocodone twice a day. We can return prescription for #60 tablets.

## 2013-10-21 NOTE — Telephone Encounter (Signed)
[phone] patient would like to speak to dr daub only regarding her pain issues

## 2013-10-25 DIAGNOSIS — M546 Pain in thoracic spine: Secondary | ICD-10-CM | POA: Diagnosis not present

## 2013-10-27 DIAGNOSIS — M546 Pain in thoracic spine: Secondary | ICD-10-CM | POA: Diagnosis not present

## 2013-10-29 DIAGNOSIS — M549 Dorsalgia, unspecified: Secondary | ICD-10-CM | POA: Diagnosis not present

## 2013-11-01 ENCOUNTER — Telehealth: Payer: Self-pay

## 2013-11-01 NOTE — Telephone Encounter (Signed)
Patient is calling needing a refill on her hydrocodone.  Patient states that she is down to her last 4 pills and is having a procedure done tomorrow.  Patient states that it is ok for husband to pick up the prescription.  Please call patient when ready for pick-up.   Best#: [phone]

## 2013-11-02 ENCOUNTER — Other Ambulatory Visit: Payer: Self-pay | Admitting: Family Medicine

## 2013-11-02 DIAGNOSIS — M47814 Spondylosis without myelopathy or radiculopathy, thoracic region: Secondary | ICD-10-CM | POA: Diagnosis not present

## 2013-11-02 DIAGNOSIS — M542 Cervicalgia: Secondary | ICD-10-CM

## 2013-11-02 MED ORDER — HYDROCODONE-ACETAMINOPHEN 5-325 MG PO TABS: 1.0000 | ORAL_TABLET | Freq: Four times a day (QID) | ORAL | Status: DC | PRN

## 2013-11-02 NOTE — Telephone Encounter (Signed)
The spread out her prescription for her hydrocodone and I can sign it.

## 2013-11-03 NOTE — Telephone Encounter (Signed)
Pt sending  her husband to p ck up RX.

## 2013-11-03 NOTE — Telephone Encounter (Signed)
[facility] Rx if ready for p/up.

## 2013-11-03 NOTE — Telephone Encounter (Signed)
[facility] Rx is ready.

## 2013-11-08 ENCOUNTER — Ambulatory Visit: Payer: Medicare Other | Admitting: Neurology

## 2013-11-17 ENCOUNTER — Telehealth: Payer: Self-pay

## 2013-11-17 NOTE — Telephone Encounter (Signed)
Extreme pain   Please call anytime except 12:30 - 1:30   [phone]

## 2013-11-18 ENCOUNTER — Other Ambulatory Visit: Payer: Self-pay | Admitting: Emergency Medicine

## 2013-11-18 DIAGNOSIS — M542 Cervicalgia: Secondary | ICD-10-CM

## 2013-11-18 MED ORDER — HYDROCODONE-ACETAMINOPHEN 5-325 MG PO TABS: 1.0000 | ORAL_TABLET | Freq: Four times a day (QID) | ORAL | Status: DC | PRN

## 2013-11-18 NOTE — Telephone Encounter (Signed)
Pt states she is still in a great deal of pain and has been using heating pads, will be seeing [facility] dr Jacelyn Grip, had injection in her spine 3 weeks ago, she is following up with dr Jacelyn Grip on Monday to let him know the injection didn't not work. She is running low on hydrocodone (6 pills left ) would like a refill for her hydrocodone.  She would like to know if you have any ither ideas of anyone else she couold possibly see that may have any other ways of helping to ease her pain ? Please advise.

## 2013-11-18 NOTE — Telephone Encounter (Signed)
Pt also needs a refill on hydrocodone, She would like to speak with Dr.Daub for her extreme pain, will be seeing Dr.Wang with [facility] on Monday.

## 2013-11-18 NOTE — Telephone Encounter (Signed)
I would advise she talk to Dr. Jacelyn Grip on Monday. Another option would be to be involved with the pain management provider such as Dr. Hardin Negus at [facility]. If she wants me to go ahead and make the referral after she discusses this with Dr. Jacelyn Grip on Monday have her give me a call

## 2013-11-19 NOTE — Telephone Encounter (Signed)
Left message on patients machine to call back to discuss dr Caren Griffins suggestions.

## 2013-11-19 NOTE — Telephone Encounter (Signed)
Spoke to patient she is aware, will speak to dr Jacelyn Grip on Monday and will call us back after their discussion is she would like the referral.

## 2013-11-22 DIAGNOSIS — M549 Dorsalgia, unspecified: Secondary | ICD-10-CM | POA: Diagnosis not present

## 2013-12-10 ENCOUNTER — Telehealth: Payer: Self-pay

## 2013-12-10 NOTE — Telephone Encounter (Signed)
Dr.Daub, Pt states that she is in a lot of pain in her back, she states that she cannot go on like this anymore she cant stand, cant sit. She states that the hydrocodone is not working for her. She states that she only would like to speak with Dr.Daub. [phone]

## 2013-12-12 NOTE — Telephone Encounter (Signed)
Dr. Everlene Farrier, she hasn't seen you since Dec. Does she need to RTC or is there anything else you recommend. Should she f/u with neuro?

## 2013-12-12 NOTE — Telephone Encounter (Signed)
I will call her. Thanks. Richardson Landry

## 2013-12-15 DIAGNOSIS — M549 Dorsalgia, unspecified: Secondary | ICD-10-CM | POA: Diagnosis not present

## 2013-12-27 ENCOUNTER — Telehealth: Payer: Self-pay

## 2013-12-27 ENCOUNTER — Other Ambulatory Visit: Payer: Self-pay | Admitting: Emergency Medicine

## 2013-12-27 DIAGNOSIS — M542 Cervicalgia: Secondary | ICD-10-CM

## 2013-12-27 MED ORDER — HYDROCODONE-ACETAMINOPHEN 5-325 MG PO TABS: 1.0000 | ORAL_TABLET | Freq: Four times a day (QID) | ORAL | Status: DC | PRN

## 2013-12-27 MED ORDER — CYCLOBENZAPRINE HCL 5 MG PO TABS: 5.0000 mg | ORAL_TABLET | Freq: Three times a day (TID) | ORAL | Status: DC | PRN

## 2013-12-27 NOTE — Telephone Encounter (Signed)
I received a call from the patient. She is currently undergoing treatment with Almeta Monas. She needs refills on her hydrocodone and Flexeril which was done. She will keep me updated as to her progress.

## 2013-12-27 NOTE — Telephone Encounter (Signed)
Rx in drawer, pt aware. 

## 2013-12-27 NOTE — Telephone Encounter (Signed)
Called pt and advised Rx is ready for p/up.

## 2013-12-27 NOTE — Telephone Encounter (Signed)
Patient said she talked with dr Everlene Farrier this morning and they talked so much she said that she did not get a clear answer in that he was going to call her hydrocodone in or not would like someone to call her at [phone]

## 2013-12-31 ENCOUNTER — Other Ambulatory Visit: Payer: Self-pay | Admitting: Emergency Medicine

## 2013-12-31 DIAGNOSIS — M4120 Other idiopathic scoliosis, site unspecified: Secondary | ICD-10-CM

## 2013-12-31 DIAGNOSIS — M545 Low back pain, unspecified: Secondary | ICD-10-CM

## 2013-12-31 DIAGNOSIS — M546 Pain in thoracic spine: Secondary | ICD-10-CM

## 2014-01-06 ENCOUNTER — Telehealth: Payer: Self-pay

## 2014-01-06 NOTE — Telephone Encounter (Signed)
Tried to call cell, VM not set up. Left message on machine to call back on home number.

## 2014-01-06 NOTE — Telephone Encounter (Signed)
Patient called back to speak to Clinical TL please call back when possible. Thanks!

## 2014-01-06 NOTE — Telephone Encounter (Signed)
Patient called stated she have an appointment with Dr. Arnoldo Morale on January 25, 2014. She is in a lot of pain and can not wait until the 28th to be seen. Patient request to bump the appointment up. She stated none of the medications prescribed is working. Patient request for Dr. Everlene Farrier to give her a call. [phone]

## 2014-01-07 NOTE — Telephone Encounter (Signed)
Tried to call cell, VM not set up. Left message on machine to call back on home number. 

## 2014-01-08 NOTE — Telephone Encounter (Signed)
Pt reports that she is on the 5th day of the prednisone pack, she is pain free for the first time in months. She states the prednisone is the only thing that has worked. She will call us if pain returns before her appt with Dr. Arnoldo Morale.

## 2014-01-21 ENCOUNTER — Telehealth: Payer: Self-pay

## 2014-01-21 NOTE — Telephone Encounter (Signed)
Patient called requesting a refill on Hydrocodone 5-325 mg .Marland KitchenMarland [phone]

## 2014-01-22 ENCOUNTER — Telehealth: Payer: Self-pay

## 2014-01-22 ENCOUNTER — Encounter: Payer: Self-pay | Admitting: Emergency Medicine

## 2014-01-22 ENCOUNTER — Ambulatory Visit (INDEPENDENT_AMBULATORY_CARE_PROVIDER_SITE_OTHER): Payer: Medicare Other | Admitting: Emergency Medicine

## 2014-01-22 VITALS — BP 158/78 | HR 78 | Temp 98.1°F | Resp 16 | Wt 132.4 lb

## 2014-01-22 DIAGNOSIS — IMO0002 Reserved for concepts with insufficient information to code with codable children: Secondary | ICD-10-CM | POA: Diagnosis not present

## 2014-01-22 DIAGNOSIS — M542 Cervicalgia: Secondary | ICD-10-CM | POA: Diagnosis not present

## 2014-01-22 DIAGNOSIS — M546 Pain in thoracic spine: Secondary | ICD-10-CM

## 2014-01-22 DIAGNOSIS — M545 Low back pain, unspecified: Secondary | ICD-10-CM

## 2014-01-22 DIAGNOSIS — Z5181 Encounter for therapeutic drug level monitoring: Secondary | ICD-10-CM | POA: Diagnosis not present

## 2014-01-22 DIAGNOSIS — M549 Dorsalgia, unspecified: Secondary | ICD-10-CM

## 2014-01-22 DIAGNOSIS — I10 Essential (primary) hypertension: Secondary | ICD-10-CM

## 2014-01-22 DIAGNOSIS — M5134 Other intervertebral disc degeneration, thoracic region: Secondary | ICD-10-CM

## 2014-01-22 LAB — POCT CBC
Granulocyte percent: 63.5 %G (ref 37–80)
HCT, POC: 37.5 % — AB (ref 37.7–47.9)
Hemoglobin: 11.8 g/dL — AB (ref 12.2–16.2)
Lymph, poc: 1.1 (ref 0.6–3.4)
MCH, POC: 30.8 pg (ref 27–31.2)
MCHC: 31.5 g/dL — AB (ref 31.8–35.4)
MCV: 98 fL — AB (ref 80–97)
MID (cbc): 0.3 (ref 0–0.9)
MPV: 8.2 fL (ref 0–99.8)
POC Granulocyte: 2.5 (ref 2–6.9)
POC LYMPH PERCENT: 28.7 %L (ref 10–50)
POC MID %: 7.8 %M (ref 0–12)
Platelet Count, POC: 212 10*3/uL (ref 142–424)
RBC: 3.83 M/uL — AB (ref 4.04–5.48)
RDW, POC: 12.8 %
WBC: 3.9 10*3/uL — AB (ref 4.6–10.2)

## 2014-01-22 LAB — POCT URINALYSIS DIPSTICK
Bilirubin, UA: NEGATIVE
Blood, UA: NEGATIVE
Glucose, UA: NEGATIVE
Ketones, UA: NEGATIVE
Leukocytes, UA: NEGATIVE
Nitrite, UA: NEGATIVE
Protein, UA: NEGATIVE
Spec Grav, UA: 1.01
Urobilinogen, UA: 0.2
pH, UA: 7

## 2014-01-22 LAB — COMPREHENSIVE METABOLIC PANEL
ALT: 23 U/L (ref 0–35)
AST: 27 U/L (ref 0–37)
Albumin: 4.7 g/dL (ref 3.5–5.2)
Alkaline Phosphatase: 56 U/L (ref 39–117)
BUN: 19 mg/dL (ref 6–23)
CO2: 29 mEq/L (ref 19–32)
Calcium: 9.7 mg/dL (ref 8.4–10.5)
Chloride: 102 mEq/L (ref 96–112)
Creat: 0.94 mg/dL (ref 0.50–1.10)
Glucose, Bld: 94 mg/dL (ref 70–99)
Potassium: 4.8 mEq/L (ref 3.5–5.3)
Sodium: 139 mEq/L (ref 135–145)
Total Bilirubin: 0.6 mg/dL (ref 0.2–1.2)
Total Protein: 7 g/dL (ref 6.0–8.3)

## 2014-01-22 LAB — TSH: TSH: 2.049 u[IU]/mL (ref 0.350–4.500)

## 2014-01-22 MED ORDER — LOSARTAN POTASSIUM 50 MG PO TABS: ORAL_TABLET | ORAL | Status: DC

## 2014-01-22 MED ORDER — HYDROCODONE-ACETAMINOPHEN 5-325 MG PO TABS: 1.0000 | ORAL_TABLET | Freq: Four times a day (QID) | ORAL | Status: DC | PRN

## 2014-01-22 NOTE — Telephone Encounter (Signed)
Pt is coming in today

## 2014-01-22 NOTE — Telephone Encounter (Signed)
Dawn Powers called to check on prescription refill, original call was made 01/21/14, and wants to know it is possible to be completed today via Dr. Everlene Farrier. She is down to last pill, and is in a lot of pain.  Please call pt.  [phone]

## 2014-01-22 NOTE — Progress Notes (Signed)
   Subjective:    Patient ID: Dawn Powers, female    DOB: August 21, 1946, 68 y.o.   MRN: [id number]  HPI Patient comes in today with complaints of back pain. She has had increasing pain the past 6 months. She states that the pain has been so intense. She is unable to function without pain medication. She is only able to be out of bed for 5 minutes in the morning before she is unable to move. She has some stretches she does to help. She is highly frustrated. The pain is on her right side, she feels a hot, burning sensation. She has been able to identify the origin.   She sees Lenore Manner for massage that has helped to alleviate the pain. Dr. Jacelyn Grip gave her two shots in the thoracic area. Dr. Jacelyn Grip states there is nothing else that he would be able to do for her.  She wants to go to Dr. Arnoldo Morale to see if she may need an injection in another location. She does not think acupuncture does not work.   Patient is concerned about her blood pressure increasing since taking all of these medications.   While she is here she would like to finish up her Hep A and Hep B Immunizations.     Review of Systems     Objective:   Physical Exam patient is tearful depressed not in distress. Blood pressure during exam: Left 180/90 Right 184/90 Neck exam reveals decreased range of motion of the neck. Motor strength is somewhat weak grip strength left hand but otherwise unremarkable. Her deep tendon reflexes of the upper cherries are 2+. Knee reflexes are 2+ ankle trace. There is no lower extremity motor weakness. Cardiac regular rate and rhythm no murmurs. Lung clear to auscultation and percussion. 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 EKG normal sinus rhythm    Assessment & Plan:  Her blood pressure is significantly elevated. She needs to stop the meloxicam. She did have a comprehensive metabolic panel done in November and this was normal. I think she would be a good candidate for either Neurontin or  even Cymbalta. I did refill her hydrocodone today. She is due to see Dr. Arnoldo Morale on Tuesday. Patient started on all losartan for blood pressure control.

## 2014-01-25 DIAGNOSIS — M542 Cervicalgia: Secondary | ICD-10-CM | POA: Diagnosis not present

## 2014-01-25 DIAGNOSIS — M549 Dorsalgia, unspecified: Secondary | ICD-10-CM | POA: Diagnosis not present

## 2014-01-25 DIAGNOSIS — M47812 Spondylosis without myelopathy or radiculopathy, cervical region: Secondary | ICD-10-CM | POA: Diagnosis not present

## 2014-01-25 DIAGNOSIS — I10 Essential (primary) hypertension: Secondary | ICD-10-CM | POA: Diagnosis not present

## 2014-01-27 DIAGNOSIS — R293 Abnormal posture: Secondary | ICD-10-CM | POA: Diagnosis not present

## 2014-01-27 DIAGNOSIS — M2560 Stiffness of unspecified joint, not elsewhere classified: Secondary | ICD-10-CM | POA: Diagnosis not present

## 2014-01-27 DIAGNOSIS — M6281 Muscle weakness (generalized): Secondary | ICD-10-CM | POA: Diagnosis not present

## 2014-01-31 ENCOUNTER — Ambulatory Visit (INDEPENDENT_AMBULATORY_CARE_PROVIDER_SITE_OTHER): Payer: Medicare Other | Admitting: Emergency Medicine

## 2014-01-31 VITALS — BP 138/88 | HR 91 | Temp 97.7°F | Resp 16 | Ht 63.0 in | Wt 130.0 lb

## 2014-01-31 DIAGNOSIS — M549 Dorsalgia, unspecified: Secondary | ICD-10-CM | POA: Diagnosis not present

## 2014-01-31 MED ORDER — GABAPENTIN 100 MG PO CAPS: ORAL_CAPSULE | ORAL | Status: DC

## 2014-01-31 NOTE — Progress Notes (Signed)
   Subjective:    Patient ID: Dawn Powers, female    DOB: 05/24/1946, 68 y.o.   MRN: [id number]  HPI Pt presents with persisent severe pain in thoracic area. She went to see Dr. Arnoldo Morale and no neuro surgery is indicated. She continues with physical therapy with Lenore Manner. She is having physical therapy through Dr. Arnoldo Morale office. Continues to have excruciating pain that is incapacitating. She has to lay down to get relief. Also has tingling in bottom of feet.     Review of Systems     Objective:   Physical Exam Reveals tearful female who is complaining of severe back pain. There are no focal neurological signs. Exam of left shoulder reveals pain on full abduction.        Assessment & Plan:  I called Dr. Mina Marble he will see pt on Wednesday. Will try neurontin 100mg , 1 in the morning 1 in the afternoon and 1-2 at bedtime. She will see me in three weeks. She will also take Losartan 25mg  a day.

## 2014-01-31 NOTE — Patient Instructions (Signed)
Appt Dr. Mina Marble 9:30 Wed.See me end of month.

## 2014-02-01 DIAGNOSIS — M6281 Muscle weakness (generalized): Secondary | ICD-10-CM | POA: Diagnosis not present

## 2014-02-01 DIAGNOSIS — M2560 Stiffness of unspecified joint, not elsewhere classified: Secondary | ICD-10-CM | POA: Diagnosis not present

## 2014-02-01 DIAGNOSIS — R293 Abnormal posture: Secondary | ICD-10-CM | POA: Diagnosis not present

## 2014-02-02 DIAGNOSIS — M549 Dorsalgia, unspecified: Secondary | ICD-10-CM | POA: Diagnosis not present

## 2014-02-04 DIAGNOSIS — R293 Abnormal posture: Secondary | ICD-10-CM | POA: Diagnosis not present

## 2014-02-04 DIAGNOSIS — M2560 Stiffness of unspecified joint, not elsewhere classified: Secondary | ICD-10-CM | POA: Diagnosis not present

## 2014-02-04 DIAGNOSIS — M6281 Muscle weakness (generalized): Secondary | ICD-10-CM | POA: Diagnosis not present

## 2014-02-08 DIAGNOSIS — M2560 Stiffness of unspecified joint, not elsewhere classified: Secondary | ICD-10-CM | POA: Diagnosis not present

## 2014-02-08 DIAGNOSIS — M6281 Muscle weakness (generalized): Secondary | ICD-10-CM | POA: Diagnosis not present

## 2014-02-08 DIAGNOSIS — R293 Abnormal posture: Secondary | ICD-10-CM | POA: Diagnosis not present

## 2014-02-10 DIAGNOSIS — M6281 Muscle weakness (generalized): Secondary | ICD-10-CM | POA: Diagnosis not present

## 2014-02-10 DIAGNOSIS — R293 Abnormal posture: Secondary | ICD-10-CM | POA: Diagnosis not present

## 2014-02-10 DIAGNOSIS — M2560 Stiffness of unspecified joint, not elsewhere classified: Secondary | ICD-10-CM | POA: Diagnosis not present

## 2014-02-14 ENCOUNTER — Telehealth: Payer: Self-pay

## 2014-02-14 DIAGNOSIS — M542 Cervicalgia: Secondary | ICD-10-CM

## 2014-02-14 NOTE — Telephone Encounter (Signed)
Patient needs a refill on hydrocodone  [phone]

## 2014-02-15 MED ORDER — HYDROCODONE-ACETAMINOPHEN 5-325 MG PO TABS: 1.0000 | ORAL_TABLET | Freq: Four times a day (QID) | ORAL | Status: DC | PRN

## 2014-02-15 NOTE — Telephone Encounter (Signed)
Rx in drawer and husband notified. He will p/up and bring pt in this weekend.

## 2014-02-15 NOTE — Telephone Encounter (Signed)
Rx for hydrocodone refilled. She should plan to see Dr. Everlene Farrier when he returns - he will be working all weekend.

## 2014-02-16 DIAGNOSIS — R293 Abnormal posture: Secondary | ICD-10-CM | POA: Diagnosis not present

## 2014-02-16 DIAGNOSIS — M6281 Muscle weakness (generalized): Secondary | ICD-10-CM | POA: Diagnosis not present

## 2014-02-16 DIAGNOSIS — M2560 Stiffness of unspecified joint, not elsewhere classified: Secondary | ICD-10-CM | POA: Diagnosis not present

## 2014-02-17 DIAGNOSIS — M6281 Muscle weakness (generalized): Secondary | ICD-10-CM | POA: Diagnosis not present

## 2014-02-17 DIAGNOSIS — M2560 Stiffness of unspecified joint, not elsewhere classified: Secondary | ICD-10-CM | POA: Diagnosis not present

## 2014-02-17 DIAGNOSIS — R293 Abnormal posture: Secondary | ICD-10-CM | POA: Diagnosis not present

## 2014-02-20 ENCOUNTER — Ambulatory Visit (INDEPENDENT_AMBULATORY_CARE_PROVIDER_SITE_OTHER): Payer: Medicare Other | Admitting: Emergency Medicine

## 2014-02-20 VITALS — BP 110/60 | HR 61 | Temp 97.9°F | Ht 63.5 in | Wt 130.6 lb

## 2014-02-20 DIAGNOSIS — IMO0002 Reserved for concepts with insufficient information to code with codable children: Secondary | ICD-10-CM | POA: Diagnosis not present

## 2014-02-20 DIAGNOSIS — M25569 Pain in unspecified knee: Secondary | ICD-10-CM

## 2014-02-20 DIAGNOSIS — M25519 Pain in unspecified shoulder: Secondary | ICD-10-CM | POA: Diagnosis not present

## 2014-02-20 DIAGNOSIS — M542 Cervicalgia: Secondary | ICD-10-CM

## 2014-02-20 DIAGNOSIS — M25512 Pain in left shoulder: Secondary | ICD-10-CM

## 2014-02-20 DIAGNOSIS — M792 Neuralgia and neuritis, unspecified: Secondary | ICD-10-CM

## 2014-02-20 DIAGNOSIS — M549 Dorsalgia, unspecified: Secondary | ICD-10-CM

## 2014-02-20 DIAGNOSIS — M25561 Pain in right knee: Secondary | ICD-10-CM

## 2014-02-20 MED ORDER — GABAPENTIN 100 MG PO CAPS: ORAL_CAPSULE | ORAL | Status: DC

## 2014-02-20 MED ORDER — HYDROCODONE-ACETAMINOPHEN 5-325 MG PO TABS: ORAL_TABLET | ORAL | Status: DC

## 2014-02-20 NOTE — Progress Notes (Signed)
   Subjective:    Patient ID: Dawn Powers, female    DOB: Jan 02, 1946, 68 y.o.   MRN: [id number]  HPI 68 year old female pt presents for knee, left shoulder, and upper back discomfort. Right knee pain started Friday night; no known injury. She felt a pop as she went to step up onto a step. Was not able to bear weight yesterday. It was swollen last night so she has been icing it and ace wrapped it. She is moving her knee a lot more than yesterday. Previously she was seen for left shoulder pain and dx with bursitis. She can get six good hours a day without having spasms when she takes hydrocodone. She functions well on the hydrocodone.  Last visit pt was told to go onto BP and she did not start taking it. Her BP today was 110/60. She monitors her BP at home. She does not notice any change on the neurontin and wants to go up on dose.   She regularly goes to physical therapy and loves her physical therapist. She goes to [facility], [facility] on [address]. Needs a new rx to continue physical therapy for her shoulder.    Review of Systems     Objective:   Physical Exam there is pain with abduction of the left shoulder. Rotator cuff testing of the left arm is normal. 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 Examination of the left knee reveals a minimal amount of swelling with normal flexion extension no joint line tenderness.        Assessment & Plan:  Continue Physical Therapy Increase Neurontin dose. Continue hydrocodone 5  1 and  one half tablets twice a day. Keep your appointment for a physical

## 2014-02-22 DIAGNOSIS — M2560 Stiffness of unspecified joint, not elsewhere classified: Secondary | ICD-10-CM | POA: Diagnosis not present

## 2014-02-22 DIAGNOSIS — M6281 Muscle weakness (generalized): Secondary | ICD-10-CM | POA: Diagnosis not present

## 2014-02-22 DIAGNOSIS — R293 Abnormal posture: Secondary | ICD-10-CM | POA: Diagnosis not present

## 2014-02-25 DIAGNOSIS — M6281 Muscle weakness (generalized): Secondary | ICD-10-CM | POA: Diagnosis not present

## 2014-02-25 DIAGNOSIS — R293 Abnormal posture: Secondary | ICD-10-CM | POA: Diagnosis not present

## 2014-02-25 DIAGNOSIS — M25519 Pain in unspecified shoulder: Secondary | ICD-10-CM | POA: Diagnosis not present

## 2014-02-25 DIAGNOSIS — M2560 Stiffness of unspecified joint, not elsewhere classified: Secondary | ICD-10-CM | POA: Diagnosis not present

## 2014-03-01 DIAGNOSIS — R293 Abnormal posture: Secondary | ICD-10-CM | POA: Diagnosis not present

## 2014-03-01 DIAGNOSIS — M6281 Muscle weakness (generalized): Secondary | ICD-10-CM | POA: Diagnosis not present

## 2014-03-01 DIAGNOSIS — M25519 Pain in unspecified shoulder: Secondary | ICD-10-CM | POA: Diagnosis not present

## 2014-03-01 DIAGNOSIS — M2560 Stiffness of unspecified joint, not elsewhere classified: Secondary | ICD-10-CM | POA: Diagnosis not present

## 2014-03-04 DIAGNOSIS — M25519 Pain in unspecified shoulder: Secondary | ICD-10-CM | POA: Diagnosis not present

## 2014-03-04 DIAGNOSIS — M6281 Muscle weakness (generalized): Secondary | ICD-10-CM | POA: Diagnosis not present

## 2014-03-04 DIAGNOSIS — R293 Abnormal posture: Secondary | ICD-10-CM | POA: Diagnosis not present

## 2014-03-04 DIAGNOSIS — M2560 Stiffness of unspecified joint, not elsewhere classified: Secondary | ICD-10-CM | POA: Diagnosis not present

## 2014-03-08 DIAGNOSIS — R293 Abnormal posture: Secondary | ICD-10-CM | POA: Diagnosis not present

## 2014-03-08 DIAGNOSIS — M6281 Muscle weakness (generalized): Secondary | ICD-10-CM | POA: Diagnosis not present

## 2014-03-08 DIAGNOSIS — M2560 Stiffness of unspecified joint, not elsewhere classified: Secondary | ICD-10-CM | POA: Diagnosis not present

## 2014-03-08 DIAGNOSIS — M25519 Pain in unspecified shoulder: Secondary | ICD-10-CM | POA: Diagnosis not present

## 2014-03-11 DIAGNOSIS — M25519 Pain in unspecified shoulder: Secondary | ICD-10-CM | POA: Diagnosis not present

## 2014-03-11 DIAGNOSIS — M6281 Muscle weakness (generalized): Secondary | ICD-10-CM | POA: Diagnosis not present

## 2014-03-11 DIAGNOSIS — M2560 Stiffness of unspecified joint, not elsewhere classified: Secondary | ICD-10-CM | POA: Diagnosis not present

## 2014-03-11 DIAGNOSIS — R293 Abnormal posture: Secondary | ICD-10-CM | POA: Diagnosis not present

## 2014-03-15 ENCOUNTER — Other Ambulatory Visit: Payer: Self-pay

## 2014-03-15 DIAGNOSIS — M2560 Stiffness of unspecified joint, not elsewhere classified: Secondary | ICD-10-CM | POA: Diagnosis not present

## 2014-03-15 DIAGNOSIS — Z1231 Encounter for screening mammogram for malignant neoplasm of breast: Secondary | ICD-10-CM

## 2014-03-15 DIAGNOSIS — R293 Abnormal posture: Secondary | ICD-10-CM | POA: Diagnosis not present

## 2014-03-15 DIAGNOSIS — M25519 Pain in unspecified shoulder: Secondary | ICD-10-CM | POA: Diagnosis not present

## 2014-03-15 DIAGNOSIS — M6281 Muscle weakness (generalized): Secondary | ICD-10-CM | POA: Diagnosis not present

## 2014-03-18 DIAGNOSIS — M25519 Pain in unspecified shoulder: Secondary | ICD-10-CM | POA: Diagnosis not present

## 2014-03-18 DIAGNOSIS — R293 Abnormal posture: Secondary | ICD-10-CM | POA: Diagnosis not present

## 2014-03-18 DIAGNOSIS — M2560 Stiffness of unspecified joint, not elsewhere classified: Secondary | ICD-10-CM | POA: Diagnosis not present

## 2014-03-18 DIAGNOSIS — M6281 Muscle weakness (generalized): Secondary | ICD-10-CM | POA: Diagnosis not present

## 2014-03-22 DIAGNOSIS — R293 Abnormal posture: Secondary | ICD-10-CM | POA: Diagnosis not present

## 2014-03-22 DIAGNOSIS — M2560 Stiffness of unspecified joint, not elsewhere classified: Secondary | ICD-10-CM | POA: Diagnosis not present

## 2014-03-22 DIAGNOSIS — M6281 Muscle weakness (generalized): Secondary | ICD-10-CM | POA: Diagnosis not present

## 2014-03-22 DIAGNOSIS — M25519 Pain in unspecified shoulder: Secondary | ICD-10-CM | POA: Diagnosis not present

## 2014-03-24 ENCOUNTER — Ambulatory Visit (INDEPENDENT_AMBULATORY_CARE_PROVIDER_SITE_OTHER): Payer: Medicare Other | Admitting: Emergency Medicine

## 2014-03-24 ENCOUNTER — Telehealth: Payer: Self-pay | Admitting: *Deleted

## 2014-03-24 VITALS — BP 158/80 | HR 60 | Temp 98.1°F | Resp 16 | Ht 64.0 in | Wt 128.0 lb

## 2014-03-24 DIAGNOSIS — M62838 Other muscle spasm: Secondary | ICD-10-CM | POA: Insufficient documentation

## 2014-03-24 DIAGNOSIS — G894 Chronic pain syndrome: Secondary | ICD-10-CM | POA: Diagnosis not present

## 2014-03-24 DIAGNOSIS — M546 Pain in thoracic spine: Secondary | ICD-10-CM

## 2014-03-24 DIAGNOSIS — M542 Cervicalgia: Secondary | ICD-10-CM | POA: Diagnosis not present

## 2014-03-24 DIAGNOSIS — M549 Dorsalgia, unspecified: Secondary | ICD-10-CM | POA: Insufficient documentation

## 2014-03-24 MED ORDER — GABAPENTIN 300 MG PO CAPS: ORAL_CAPSULE | ORAL | Status: DC

## 2014-03-24 MED ORDER — HYDROCODONE-ACETAMINOPHEN 5-325 MG PO TABS: 1.0000 | ORAL_TABLET | Freq: Four times a day (QID) | ORAL | Status: DC | PRN

## 2014-03-24 NOTE — Telephone Encounter (Signed)
Pt called to say that she is in a lot of pain and she can barely walk. She is going to come in tomorrow 8/26 to see Dr. Everlene Farrier to talk about going up of her pain medication.  Advised patient to call us at anytime if she needs to speak with someone, she said she would.

## 2014-03-24 NOTE — Progress Notes (Signed)
Subjective:    Patient ID: Dawn Powers, female    DOB: [birth year], 68 y.o.   MRN: [id number]  HPI Scribed for Dawn Jordan MD, the patient was seen in room 22. This chart was scribed by Denice Bors, ED scribe. Patient's care was started at 3:01 PM  HPI Comments: Hx was provided by the pt and medical records.  Dawn Powers is a 68 y.o. female who presents to the [facility] with PMHx of chronic back pain secondary to DDD and cervical-thoracic scoliosis complaining of persistent unchanged chronic back pain. Describes pain as moderate to severe in severity. Denies any precipitating factors. Denies any aggravating factors. States pain is mildly alleviated by pressure, heat, ice, neurontin and Norco. Denies associated urinary or fecal incontinence, urinary retention, perineal/saddle paresthesias, fever, and recent fall/trauma. Reports mild depression due to pain, but denies suicidal ideations and homicidal ideations. States her she and her husband decided to cancel a retirement cruise recently due to persistent uncontrolled pain. States she is following up with physical therapist and a hand specialist. Reports recent evaluation by Dr. Jacelyn Grip for back injections. Reports hx of back injections in the past with no relief of symptoms.    Past Medical History  Diagnosis Date  . Hyperlipidemia   . Arthritis   . Heart murmur     Past Surgical History  Procedure Laterality Date  . Fracture surgery      ankle    Family History  Problem Relation Age of Onset  . Cancer Mother   . Heart disease Mother   . Alcohol abuse Mother   . Dementia Mother   . Cancer Father   . Alcohol abuse Father   . Diabetes Brother   . Alcohol abuse Maternal Grandfather     History   Social History  . Marital Status: Married    Spouse Name: N/A    Number of Children: N/A  . Years of Education: N/A   Occupational History  . Not on file.   Social History Main Topics  . Smoking  status: Former Smoker -- 4 years    Types: Cigarettes    Quit date: 09/30/2001  . Smokeless tobacco: Never Used  . 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 Drug Use: No  . Sexual Activity: Yes    Partners: Female    Patent examiner Protection: None   Other Topics Concern  . Not on file   Social History Narrative   Married. Education: college and Other. Exercise 4 times a week for 50 minutes.    Allergies  Allergen Reactions  . Morphine And Related Hives    Patient Active Problem List   Diagnosis Date Noted  . Scoliosis (and kyphoscoliosis), idiopathic 12/22/2012  . Osteopenia 12/22/2012  . Other and unspecified hyperlipidemia 12/22/2012    Filed Vitals:   03/24/14 1453  BP: 158/80  Pulse: 60  Temp: 98.1 F (36.7 C)  Resp: 16  Height: 5\' 4"  (1.626 m)  Weight: 128 lb (58.06 kg)  SpO2: 98%        Review of Systems  Constitutional: Negative for fever.  Genitourinary: Negative.   Musculoskeletal: Positive for back pain.  Neurological: Negative.    A complete 10 system review of systems was obtained and all systems are negative except as noted in the HPI and PMHx.      Objective:   Physical Exam Physical Exam  Vitals reviewed. Constitutional: He is  oriented to person, place, and time. He appears well-developed and well-nourished. No distress.  HENT:  Head: Normocephalic and atraumatic.  Throat: Oropharynx is clear and moist. Mucous membranes normal. Uvula is midline.  Eyes: EOM are normal.  Neck: Neck supple. No tracheal deviation present.  Cardiovascular: Normal rate.   Pulmonary/Chest: Effort normal. No respiratory distress.  Musculoskeletal: Normal range of motion. No midline C-spine, T-spine, or L-spine tenderness with no step-offs or deformities noted  Neurological: He is alert and oriented to person, place, and time.  Skin: Skin is warm and dry.  Psychiatric: He has a normal mood and affect. His behavior is normal.            Assessment & Plan:     ICD-9-CM   1. Neck pain 723.1 HYDROcodone-acetaminophen (NORCO) 5-325 MG per tablet    Ambulatory referral to Pain Clinic  2. Midline thoracic back pain 724.1 Ambulatory referral to Pain Clinic   Will increase her hydrocodone to allow for pills for 24 hours. Neurontin increased to 300 mg in the morning 300 min afternoon and 300 at bedtime. Referral made to pain management to get their help. I told her to consider addition of low dose Cymbalta .

## 2014-03-25 DIAGNOSIS — M25519 Pain in unspecified shoulder: Secondary | ICD-10-CM | POA: Diagnosis not present

## 2014-03-25 DIAGNOSIS — M6281 Muscle weakness (generalized): Secondary | ICD-10-CM | POA: Diagnosis not present

## 2014-03-25 DIAGNOSIS — M2560 Stiffness of unspecified joint, not elsewhere classified: Secondary | ICD-10-CM | POA: Diagnosis not present

## 2014-03-25 DIAGNOSIS — R293 Abnormal posture: Secondary | ICD-10-CM | POA: Diagnosis not present

## 2014-03-29 ENCOUNTER — Ambulatory Visit: Payer: Medicare Other

## 2014-03-29 ENCOUNTER — Telehealth: Payer: Self-pay

## 2014-03-29 ENCOUNTER — Ambulatory Visit
Admission: RE | Admit: 2014-03-29 | Discharge: 2014-03-29 | Disposition: A | Discharge status: Home / Self Care | Payer: Medicare Other | Source: Ambulatory Visit

## 2014-03-29 DIAGNOSIS — M2560 Stiffness of unspecified joint, not elsewhere classified: Secondary | ICD-10-CM | POA: Diagnosis not present

## 2014-03-29 DIAGNOSIS — M6281 Muscle weakness (generalized): Secondary | ICD-10-CM | POA: Diagnosis not present

## 2014-03-29 DIAGNOSIS — R293 Abnormal posture: Secondary | ICD-10-CM | POA: Diagnosis not present

## 2014-03-29 DIAGNOSIS — Z1231 Encounter for screening mammogram for malignant neoplasm of breast: Secondary | ICD-10-CM

## 2014-03-29 DIAGNOSIS — M25519 Pain in unspecified shoulder: Secondary | ICD-10-CM | POA: Diagnosis not present

## 2014-03-29 NOTE — Telephone Encounter (Signed)
Pt was advised to RTC if her neck pain was better. She is in pain as she finished PT but her ROM is getting better. She will RTC sooner if needed.

## 2014-03-29 NOTE — Telephone Encounter (Signed)
Dr.Daub, Pt states that she was asked to come in to follow up 3 weeks after 03/24/14, she states that she has an appt booked on 05/05/14 and would like to know if she would be able to come in on that day instead or should she schedule an addtl appt for the follow up! Best#[phone]

## 2014-03-31 ENCOUNTER — Encounter: Payer: Medicare Other | Admitting: Family Medicine

## 2014-03-31 ENCOUNTER — Other Ambulatory Visit: Payer: Self-pay | Admitting: Radiology

## 2014-03-31 ENCOUNTER — Ambulatory Visit (INDEPENDENT_AMBULATORY_CARE_PROVIDER_SITE_OTHER): Payer: Medicare Other

## 2014-03-31 DIAGNOSIS — M25512 Pain in left shoulder: Secondary | ICD-10-CM

## 2014-03-31 DIAGNOSIS — M25519 Pain in unspecified shoulder: Secondary | ICD-10-CM

## 2014-03-31 NOTE — Progress Notes (Signed)
Pt of Dr. Perfecto Kingdom here only for shoulder xray; she has c/o chronic L shoulder pain w/ certain movements.   [facility] reading (PRIMARY) by  Dr. Leward Quan: L shoulder- No fracture or dislocation; slight widening of joint space in superior part of socket. Small bony protrusion on humeral head.  Pt has pending ORTHO referral.     This encounter was created in error - please disregard.

## 2014-03-31 NOTE — Progress Notes (Signed)
Patient came in for xray only per Dr Everlene Farrier. She is complaining of left shoulder pain with range of motion. Patient indicates her back spasms have resolved in the past 3 days. I will advise Dr Everlene Farrier. Dr Everlene Farrier has asked Dr Markham Jordan to read the xrays, since he is out of the office this afternoon.

## 2014-04-01 ENCOUNTER — Encounter: Payer: Self-pay | Admitting: Family Medicine

## 2014-04-04 ENCOUNTER — Telehealth: Payer: Self-pay

## 2014-04-04 NOTE — Telephone Encounter (Signed)
Pt states this is her second call and "its extremely important that someone call her this evening" She begins P.T. Tomorrow and states needs to know results before beginning Physical therapy

## 2014-04-04 NOTE — Telephone Encounter (Signed)
From radiologist report on xray  IMPRESSION:  No acute osseous injury of the left shoulder. Mild osteoarthritis of  the left glenohumeral joint.

## 2014-04-04 NOTE — Telephone Encounter (Signed)
Pt.notified

## 2014-04-04 NOTE — Telephone Encounter (Signed)
Please review xray results. Thanks

## 2014-04-04 NOTE — Telephone Encounter (Signed)
Patient called regarding x-rays. States she has physical therapy tomorrow and needs results. Please return call at [phone]. Thank you

## 2014-04-04 NOTE — Telephone Encounter (Signed)
Can someone review this for Dr. Everlene Farrier please. Thanks

## 2014-04-05 DIAGNOSIS — M25519 Pain in unspecified shoulder: Secondary | ICD-10-CM | POA: Diagnosis not present

## 2014-04-05 DIAGNOSIS — R293 Abnormal posture: Secondary | ICD-10-CM | POA: Diagnosis not present

## 2014-04-05 DIAGNOSIS — M2560 Stiffness of unspecified joint, not elsewhere classified: Secondary | ICD-10-CM | POA: Diagnosis not present

## 2014-04-05 DIAGNOSIS — M6281 Muscle weakness (generalized): Secondary | ICD-10-CM | POA: Diagnosis not present

## 2014-04-08 DIAGNOSIS — M2560 Stiffness of unspecified joint, not elsewhere classified: Secondary | ICD-10-CM | POA: Diagnosis not present

## 2014-04-08 DIAGNOSIS — M25519 Pain in unspecified shoulder: Secondary | ICD-10-CM | POA: Diagnosis not present

## 2014-04-08 DIAGNOSIS — M7500 Adhesive capsulitis of unspecified shoulder: Secondary | ICD-10-CM | POA: Diagnosis not present

## 2014-04-08 DIAGNOSIS — R293 Abnormal posture: Secondary | ICD-10-CM | POA: Diagnosis not present

## 2014-04-08 DIAGNOSIS — M6281 Muscle weakness (generalized): Secondary | ICD-10-CM | POA: Diagnosis not present

## 2014-04-12 ENCOUNTER — Encounter: Payer: Self-pay | Admitting: Physical Medicine & Rehabilitation

## 2014-04-12 DIAGNOSIS — R293 Abnormal posture: Secondary | ICD-10-CM | POA: Diagnosis not present

## 2014-04-12 DIAGNOSIS — M2560 Stiffness of unspecified joint, not elsewhere classified: Secondary | ICD-10-CM | POA: Diagnosis not present

## 2014-04-12 DIAGNOSIS — M6281 Muscle weakness (generalized): Secondary | ICD-10-CM | POA: Diagnosis not present

## 2014-04-12 DIAGNOSIS — M25519 Pain in unspecified shoulder: Secondary | ICD-10-CM | POA: Diagnosis not present

## 2014-04-14 DIAGNOSIS — M25519 Pain in unspecified shoulder: Secondary | ICD-10-CM | POA: Diagnosis not present

## 2014-04-14 DIAGNOSIS — M6281 Muscle weakness (generalized): Secondary | ICD-10-CM | POA: Diagnosis not present

## 2014-04-14 DIAGNOSIS — M2560 Stiffness of unspecified joint, not elsewhere classified: Secondary | ICD-10-CM | POA: Diagnosis not present

## 2014-04-14 DIAGNOSIS — R293 Abnormal posture: Secondary | ICD-10-CM | POA: Diagnosis not present

## 2014-04-19 DIAGNOSIS — M2560 Stiffness of unspecified joint, not elsewhere classified: Secondary | ICD-10-CM | POA: Diagnosis not present

## 2014-04-19 DIAGNOSIS — M25519 Pain in unspecified shoulder: Secondary | ICD-10-CM | POA: Diagnosis not present

## 2014-04-19 DIAGNOSIS — R293 Abnormal posture: Secondary | ICD-10-CM | POA: Diagnosis not present

## 2014-04-19 DIAGNOSIS — M6281 Muscle weakness (generalized): Secondary | ICD-10-CM | POA: Diagnosis not present

## 2014-04-21 DIAGNOSIS — R293 Abnormal posture: Secondary | ICD-10-CM | POA: Diagnosis not present

## 2014-04-21 DIAGNOSIS — M6281 Muscle weakness (generalized): Secondary | ICD-10-CM | POA: Diagnosis not present

## 2014-04-21 DIAGNOSIS — M25519 Pain in unspecified shoulder: Secondary | ICD-10-CM | POA: Diagnosis not present

## 2014-04-21 DIAGNOSIS — M2560 Stiffness of unspecified joint, not elsewhere classified: Secondary | ICD-10-CM | POA: Diagnosis not present

## 2014-04-26 ENCOUNTER — Encounter: Payer: Medicare Other | Attending: Physical Medicine & Rehabilitation

## 2014-04-26 ENCOUNTER — Ambulatory Visit (HOSPITAL_BASED_OUTPATIENT_CLINIC_OR_DEPARTMENT_OTHER): Payer: Medicare Other | Admitting: Physical Medicine & Rehabilitation

## 2014-04-26 ENCOUNTER — Encounter: Payer: Self-pay | Admitting: Physical Medicine & Rehabilitation

## 2014-04-26 VITALS — BP 146/76 | HR 74 | Resp 14 | Ht 63.0 in | Wt 131.0 lb

## 2014-04-26 DIAGNOSIS — M5380 Other specified dorsopathies, site unspecified: Secondary | ICD-10-CM | POA: Diagnosis not present

## 2014-04-26 DIAGNOSIS — Z87891 Personal history of nicotine dependence: Secondary | ICD-10-CM | POA: Insufficient documentation

## 2014-04-26 DIAGNOSIS — M419 Scoliosis, unspecified: Secondary | ICD-10-CM

## 2014-04-26 DIAGNOSIS — M546 Pain in thoracic spine: Secondary | ICD-10-CM | POA: Diagnosis not present

## 2014-04-26 DIAGNOSIS — M2560 Stiffness of unspecified joint, not elsewhere classified: Secondary | ICD-10-CM | POA: Diagnosis not present

## 2014-04-26 DIAGNOSIS — M6281 Muscle weakness (generalized): Secondary | ICD-10-CM | POA: Diagnosis not present

## 2014-04-26 DIAGNOSIS — R293 Abnormal posture: Secondary | ICD-10-CM | POA: Diagnosis not present

## 2014-04-26 DIAGNOSIS — M4120 Other idiopathic scoliosis, site unspecified: Secondary | ICD-10-CM | POA: Diagnosis not present

## 2014-04-26 DIAGNOSIS — M47814 Spondylosis without myelopathy or radiculopathy, thoracic region: Principal | ICD-10-CM

## 2014-04-26 DIAGNOSIS — E785 Hyperlipidemia, unspecified: Secondary | ICD-10-CM | POA: Diagnosis not present

## 2014-04-26 DIAGNOSIS — M47894 Other spondylosis, thoracic region: Secondary | ICD-10-CM

## 2014-04-26 DIAGNOSIS — M25519 Pain in unspecified shoulder: Secondary | ICD-10-CM | POA: Diagnosis not present

## 2014-04-26 MED ORDER — TRAMADOL HCL 50 MG PO TABS: 50.0000 mg | ORAL_TABLET | Freq: Four times a day (QID) | ORAL | Status: DC | PRN

## 2014-04-26 NOTE — Progress Notes (Signed)
Subjective:    Patient ID: Dawn Powers, female    DOB: 1946-06-19, 68 y.o.   MRN: [id number]  HPI  Mid back pain that wraps around ribs on the right side, pain radiates to the flank area but not into the axillary area. Pain is inferior to the scapula No trauma to area  Also with Left frozen shoulder  Pressure on area Heat Ice helpful  Seen by Neurosurgery Orthopedic spine surgery-[facility]  PT Hand rehab 8wks This is helpful  Has had acupuncture as well as dry kneeling which was not helpful.  Tried without success Baclofen Meloxicam Voltaren  Hydrocodone 5/325mg  takes one or 1.5, some relief but doesn't like drowsiness or constipation side effects Gabapentin  300mg  TID not sure it helps, dry mouth, changes urine stream  Facet- injection per Dr. Mina Marble, patient described in the area near the scapula.  Thoracic films show dextro convex scoliosis centered at T10  Pain Inventory Average Pain 4 Pain Right Now 0 My pain is intermittent, sharp, burning and stabbing  In the last 24 hours, has pain interfered with the following? General activity 7 Relation with others 6 Enjoyment of life 6 What TIME of day is your pain at its worst? morning, evening Sleep (in general) Good  Pain is worse with: bending and some activites Pain improves with: rest, heat/ice and therapy/exercise Relief from Meds: 5  Mobility walk without assistance how many minutes can you walk? 30 ability to climb steps?  yes do you drive?  yes transfers alone Do you have any goals in this area?  yes  Function employed # of hrs/week 20  Neuro/Psych weakness spasms depression  Prior Studies Any changes since last visit?  yes bone scan x-rays CT/MRI Clinical Data:  The cervical and thoracic region pain and spasms. Duration of symptoms 6 months.   MRI CERVICAL AND THORACIC SPINE WITHOUT CONTRAST   Technique:  Multiplanar and multiecho pulse sequences of  the cervical spine, to include the craniocervical junction and cervicothoracic junction, and the thoracic spine, were obtained without intravenous contrast.   Comparison:  Radiography 05/10/2013.  Chest CT 12/24/2012. Thoracic spine radiographs 12/22/2012.   MRI CERVICAL SPINE   Findings:  The foramen magnum is widely patent.  C1-2 is normal.   C2-3:  No disc pathology.  Facet degeneration on the left without significant encroachment upon the canal or foramen.   C3-4:  Bilateral facet degeneration with 1 mm of anterolisthesis. Endplate osteophytes and bulging of the disc more prominent on the left.  No compressive narrowing of the central canal.  Moderate foraminal narrowing on the left.   C4-5:  Facet arthropathy on the left.  Anterolisthesis of 1 mm. Desiccation of the disc without osteophyte formation or disc protrusion.  The central canal is widely patent.  No significant foraminal narrowing.   C5-6:  Spondylosis with disc degeneration and loss of disc height. Endplate osteophytes and mild bulging of the disc.  Wide patency of the central canal.  Mild foraminal encroachment bilaterally by osteophytes, right more than left.   C6-7:  Spondylosis with endplate osteophytes and bulging disc material.  No significant narrowing of the central canal.  Moderate foraminal narrowing on the right and mild foraminal narrowing on the left.   C7-C1:  Facet degeneration with 1 mm of anterolisthesis.  No disc pathology.  No stenosis.   IMPRESSION: Upper cervical facet degeneration left worse than right which could be a cause of neck pain.  Foraminal narrowing on the  left at C3-4 because of osteophytic encroachment that could possibly affect the left C4 nerve root.  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 There is some potential for neural compression on the right at C6- 7. These findings could certainly relate to  neck pain.   MRI THORACIC SPINE   Findings: There is mild thoracic curvature convex to the right with the apex at T8.   T1-2:  Right paracentral disc herniation indents the thecal sac but does not appear to cause neural compression.   T2-3:  Central disc herniation with slight caudal down turning indents the thecal sac but does not appear to compress the neural structures. There is mild facet degeneration.   T3-4: No disc pathology.  There is mild facet degeneration.   T4-5:  Small central disc protrusion indents the thecal sac but does not appear to cause neural compression.  There is mild facet degeneration.   T5-6:  No disc pathology or facet pathology.   T6-7:  Normal interspace.   T7-8:  Normal interspace.   T8-9:  Normal interspace.   T9-10:  Mild bulging of the disc.  Mild facet degeneration.  No stenosis.   T10-11:  Bulging of the disc.  Facet degeneration left worse than right.  Mild foraminal narrowing on the left because of osteophytic encroachment.   T11-12:  Mild bulging of the disc.  Mild facet degeneration on the left.  No stenosis.   T12-L1:  Mild bulging of the disc.  No stenosis.   No abnormal cord signal.   IMPRESSION: Mild thoracic curvature convex to the left.   Disc herniations at T1-2, T2-3 and T4-5.  These indent the thecal sac but do not appear to have any compressive effect upon the spinal cord or show foraminal extension.  They could relate to back pain.   Upper thoracic facet osteoarthritis, rather ordinary in degree, but possibly relate to back pain.   Left-sided facet arthropathy at T9-10, T10-11 and T11-12.  Some foraminal narrowing on the left at T10-11.  Physicians involved in your care Any changes since last visit?  yes Dr. Everlene Farrier, [facility], [facility]   Family History  Problem Relation Age of Onset  . Cancer Mother   . Heart disease Mother   . Alcohol abuse Mother   . Dementia Mother   . Cancer Father     . Alcohol abuse Father   . Diabetes Brother   . Alcohol abuse Maternal Grandfather   . Alcohol abuse Sister    History   Social History  . Marital Status: Married    Spouse Name: N/A    Number of Children: N/A  . Years of Education: N/A   Social History Main Topics  . Smoking status: Former Smoker -- 4 years    Types: Cigarettes    Quit date: 09/30/2001  . Smokeless tobacco: Never Used  . 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 Drug Use: No  . Sexual Activity: Yes    Partners: Female    Patent examiner Protection: None   Other Topics Concern  . None   Social History Narrative   Married. Education: college and Other. Exercise 4 times a week for 50 minutes.   Past Surgical History  Procedure Laterality Date  . Fracture surgery      ankle   Past Medical History  Diagnosis Date  . Hyperlipidemia   .  Arthritis   . Heart murmur    BP 146/76  Pulse 74  Resp 14  Ht 5\' 3"  (1.6 m)  Wt 131 lb (59.421 kg)  BMI 23.21 kg/m2  SpO2 99%  Opioid Risk Score: 4 Fall Risk Score: Moderate Fall Risk (6-13 points) (pt educated on fall risk, brochure given to pt)    Review of Systems  Gastrointestinal: Positive for constipation.  Musculoskeletal: Positive for back pain and neck pain.  Neurological: Positive for weakness.       Spasms  Psychiatric/Behavioral:       Depression  All other systems reviewed and are negative.      Objective:   Physical Exam  Nursing note and vitals reviewed. Constitutional: She is oriented to person, place, and time. She appears well-developed and well-nourished.  HENT:  Head: Normocephalic and atraumatic.  Eyes: Conjunctivae and EOM are normal. Pupils are equal, round, and reactive to light.  Neck: Normal range of motion.  Musculoskeletal:       Cervical back: Normal.       Thoracic back: She exhibits tenderness and deformity. She exhibits normal range of motion.  Neurological: She is alert and oriented to person,  place, and time. She has normal reflexes. She displays no atrophy and no tremor. No cranial nerve deficit or sensory deficit. She exhibits normal muscle tone. Coordination and gait normal.  Motor strength 5/5 bilateral deltoid, bicep, tricep, grip, hip flexor, knee extensors, ankle dorsiflexor plantar flexor Sensation intact to pinprick in bilateral C5 C6-C7 C8, T 1 L2 L3-L4 L5-S1 dermatomal distribution No pinprick loss over the posterior thoracic paraspinal area bilaterally  Psychiatric: She has a normal mood and affect. Her behavior is normal.   Dextroconvex scoliosis thoraco lumbar        Assessment & Plan:  1. Right lower thoracic pain. No clear cut radicular signs. Suspect thoracic spondylosis as well as degenerative disc as etiology. Patient's underlying scoliosis is likely the main etiology. May benefit from diagnostic/therapeutic thoracic medial branch blocks which can be done under fluoroscopic guidance. Would recommend Right T, 8,9,10, 11 Medial branch blocks We discussed the procedure using spine model. We also discussed that if this is helpful but short term and release at least 50% of pain that this would first be repeated and if similar results are obtained, radio frequency procedure may be indicated. Made a referral to Dr. Ernestina Patches for this procedure.  In terms of medication management we'll try tramadol given her side effects from hydrocodone., Dis continue gabapentin  Followup in my clinic should medial branch blocks fail to improve symptoms

## 2014-04-26 NOTE — Patient Instructions (Signed)
Please make appointment with me if the medial branch blocks are not helpful  Trial of tramadol, if I will be prescribed in this for you a long-term basis will need office visits at least every 6 months

## 2014-04-27 DIAGNOSIS — M419 Scoliosis, unspecified: Secondary | ICD-10-CM | POA: Insufficient documentation

## 2014-04-27 DIAGNOSIS — M47814 Spondylosis without myelopathy or radiculopathy, thoracic region: Principal | ICD-10-CM

## 2014-04-27 DIAGNOSIS — M47894 Other spondylosis, thoracic region: Secondary | ICD-10-CM | POA: Insufficient documentation

## 2014-04-28 DIAGNOSIS — M2560 Stiffness of unspecified joint, not elsewhere classified: Secondary | ICD-10-CM | POA: Diagnosis not present

## 2014-04-28 DIAGNOSIS — M25519 Pain in unspecified shoulder: Secondary | ICD-10-CM | POA: Diagnosis not present

## 2014-04-28 DIAGNOSIS — R293 Abnormal posture: Secondary | ICD-10-CM | POA: Diagnosis not present

## 2014-04-28 DIAGNOSIS — M6281 Muscle weakness (generalized): Secondary | ICD-10-CM | POA: Diagnosis not present

## 2014-05-03 DIAGNOSIS — M6281 Muscle weakness (generalized): Secondary | ICD-10-CM | POA: Diagnosis not present

## 2014-05-03 DIAGNOSIS — R293 Abnormal posture: Secondary | ICD-10-CM | POA: Diagnosis not present

## 2014-05-03 DIAGNOSIS — M25519 Pain in unspecified shoulder: Secondary | ICD-10-CM | POA: Diagnosis not present

## 2014-05-03 DIAGNOSIS — M2560 Stiffness of unspecified joint, not elsewhere classified: Secondary | ICD-10-CM | POA: Diagnosis not present

## 2014-05-04 DIAGNOSIS — M546 Pain in thoracic spine: Secondary | ICD-10-CM | POA: Diagnosis not present

## 2014-05-04 DIAGNOSIS — G894 Chronic pain syndrome: Secondary | ICD-10-CM | POA: Diagnosis not present

## 2014-05-04 DIAGNOSIS — M47817 Spondylosis without myelopathy or radiculopathy, lumbosacral region: Secondary | ICD-10-CM | POA: Diagnosis not present

## 2014-05-05 ENCOUNTER — Encounter: Payer: Medicare Other | Admitting: Emergency Medicine

## 2014-05-05 DIAGNOSIS — M2560 Stiffness of unspecified joint, not elsewhere classified: Secondary | ICD-10-CM | POA: Diagnosis not present

## 2014-05-05 DIAGNOSIS — M6281 Muscle weakness (generalized): Secondary | ICD-10-CM | POA: Diagnosis not present

## 2014-05-05 DIAGNOSIS — M25519 Pain in unspecified shoulder: Secondary | ICD-10-CM | POA: Diagnosis not present

## 2014-05-05 DIAGNOSIS — R293 Abnormal posture: Secondary | ICD-10-CM | POA: Diagnosis not present

## 2014-05-12 ENCOUNTER — Telehealth: Payer: Self-pay | Admitting: *Deleted

## 2014-05-12 NOTE — Telephone Encounter (Signed)
Dawn Powers called and was upset because she was told she must wean down off her gabapentin and no one called to tell her how and so she is still taking it.  I spoke with dr Letta Pate and he said she is on such a low dose she would not have to wean down.  I called and spoke with her to tell her this but she says that she thinks it is actually helping and does not want to discontinue.  She is actually feeling better.  I will inform Dr Letta Pate.  She said to tell him that she has seen Dr Ernestina Patches and will get back with [place] for her pain when necessary.

## 2014-05-13 DIAGNOSIS — M6281 Muscle weakness (generalized): Secondary | ICD-10-CM | POA: Diagnosis not present

## 2014-05-13 DIAGNOSIS — M2560 Stiffness of unspecified joint, not elsewhere classified: Secondary | ICD-10-CM | POA: Diagnosis not present

## 2014-05-13 DIAGNOSIS — R293 Abnormal posture: Secondary | ICD-10-CM | POA: Diagnosis not present

## 2014-05-13 DIAGNOSIS — M25519 Pain in unspecified shoulder: Secondary | ICD-10-CM | POA: Diagnosis not present

## 2014-05-17 ENCOUNTER — Ambulatory Visit (INDEPENDENT_AMBULATORY_CARE_PROVIDER_SITE_OTHER): Payer: Medicare Other | Admitting: Emergency Medicine

## 2014-05-17 ENCOUNTER — Encounter: Payer: Self-pay | Admitting: Emergency Medicine

## 2014-05-17 VITALS — BP 127/64 | HR 60 | Temp 98.0°F | Resp 16 | Ht 63.0 in | Wt 128.2 lb

## 2014-05-17 DIAGNOSIS — K130 Diseases of lips: Secondary | ICD-10-CM

## 2014-05-17 DIAGNOSIS — M4120 Other idiopathic scoliosis, site unspecified: Secondary | ICD-10-CM

## 2014-05-17 DIAGNOSIS — E785 Hyperlipidemia, unspecified: Secondary | ICD-10-CM | POA: Diagnosis not present

## 2014-05-17 DIAGNOSIS — I10 Essential (primary) hypertension: Secondary | ICD-10-CM

## 2014-05-17 DIAGNOSIS — M546 Pain in thoracic spine: Secondary | ICD-10-CM

## 2014-05-17 DIAGNOSIS — M542 Cervicalgia: Secondary | ICD-10-CM

## 2014-05-17 DIAGNOSIS — Z23 Encounter for immunization: Secondary | ICD-10-CM | POA: Diagnosis not present

## 2014-05-17 DIAGNOSIS — Z1211 Encounter for screening for malignant neoplasm of colon: Secondary | ICD-10-CM

## 2014-05-17 DIAGNOSIS — Z0000 Encounter for general adult medical examination without abnormal findings: Secondary | ICD-10-CM

## 2014-05-17 DIAGNOSIS — M899 Disorder of bone, unspecified: Secondary | ICD-10-CM | POA: Diagnosis not present

## 2014-05-17 LAB — POCT URINALYSIS DIPSTICK
Bilirubin, UA: NEGATIVE
Blood, UA: NEGATIVE
Glucose, UA: NEGATIVE
Ketones, UA: NEGATIVE
Leukocytes, UA: NEGATIVE
Nitrite, UA: NEGATIVE
Protein, UA: NEGATIVE
Spec Grav, UA: 1.015
Urobilinogen, UA: 0.2
pH, UA: 5

## 2014-05-17 LAB — COMPLETE METABOLIC PANEL WITH GFR
ALT: 16 U/L (ref 0–35)
AST: 22 U/L (ref 0–37)
Albumin: 4.5 g/dL (ref 3.5–5.2)
Alkaline Phosphatase: 58 U/L (ref 39–117)
BUN: 19 mg/dL (ref 6–23)
CO2: 31 mEq/L (ref 19–32)
Calcium: 9.5 mg/dL (ref 8.4–10.5)
Chloride: 103 mEq/L (ref 96–112)
Creat: 0.87 mg/dL (ref 0.50–1.10)
GFR, Est African American: 78 mL/min
GFR, Est Non African American: 68 mL/min
Glucose, Bld: 76 mg/dL (ref 70–99)
Potassium: 4.4 mEq/L (ref 3.5–5.3)
Sodium: 142 mEq/L (ref 135–145)
Total Bilirubin: 0.7 mg/dL (ref 0.2–1.2)
Total Protein: 6.8 g/dL (ref 6.0–8.3)

## 2014-05-17 LAB — LIPID PANEL
Cholesterol: 223 mg/dL — ABNORMAL HIGH (ref 0–200)
HDL: 84 mg/dL (ref >39)
LDL Cholesterol: 120 mg/dL — ABNORMAL HIGH (ref 0–99)
Total CHOL/HDL Ratio: 2.7 Ratio
Triglycerides: 95 mg/dL (ref <150)
VLDL: 19 mg/dL (ref 0–40)

## 2014-05-17 MED ORDER — HYDROCODONE-ACETAMINOPHEN 5-325 MG PO TABS: 1.0000 | ORAL_TABLET | Freq: Four times a day (QID) | ORAL | Status: DC | PRN

## 2014-05-17 NOTE — Progress Notes (Signed)
Subjective:  This chart was scribed for Dawn Queen, MD by Dawn Powers, Medical Scribe. This patient was seen in Room 25 and the patient's care was started at 4:21 PM.   Patient ID: Dawn Powers, female    DOB: Feb 11, 1946, 68 y.o.   MRN: [id number]  HPI HPI Comments: Dawn Powers is a 68 y.o. female with a history of chronic back pain who presents to the [facility] for an annual exam.  Since her last visit, she has seen Dr. Erasmo Downer who examined her and found four places in her thoracic spine that would benefit from having a neurotomy.  He also told her to stop taking Gabapentin and Hydrocodone and take 2 Tramadol instead.  She did not follow his advice and is still taking the medication prescribed by Dr. Everlene Farrier.  She also saw Dr. Ernestina Patches who explained the neurotomy procedure and agreed to do the procedure this Thursday when she is in pain in order to adequately deduce the level of pain she is in.  She takes 900mg  of Gabapentin in 3 300mg  doses daily.  She experienced severe back pain last night and experienced relief to her symptoms after she took 2 Hydrocodone and had her husband massage her back.  She states that she takes a maximum of 1.5 Hydrocodone pills a day.  She does not take the medication daily.    She is also complaining of a bump on her lower lip.  She experienced these symptoms as a child when she lived in the [place].  She has not seen a dermatologist.   She is also bruising more easily and would like to see if it has anything to do with her Gabapentin dosage.  She is also complaining of constant left arm pain.  She has seen a physical therapist for her symptoms and seen some improvement in her level of pain.  She had an x-ray of her left arm that revealed a tear in one of her ligaments.    She is complaining of sleep disturbances.  Her Hydrocodone no longer puts her to sleep.  She has not tried melatonin.  She has been experiencing constipation from  Hydrocodone.  Her Gabapentin also causes her to experience dry mouth and some oral swelling.  She sees Dr. Ammie Dalton once a year with her last visit being 6 months ago.  She is due for a colonoscopy next year and her last colonoscopy was completely clear.  Her last colonoscopy was performed by Dr. Carlean Purl at [place].  Her last EKG was in April 2015.  She has not seen Dr. Baldwin [place], her eye doctor, in over a year.    She has been retired since July 31.  She is no longer exercising regularly.    Past Medical History  Diagnosis Date  . Hyperlipidemia   . Arthritis   . Heart murmur    Past Surgical History  Procedure Laterality Date  . Fracture surgery      ankle   Family History  Problem Relation Age of Onset  . Cancer Mother   . Heart disease Mother   . Alcohol abuse Mother   . Dementia Mother   . Cancer Father   . Alcohol abuse Father   . Diabetes Brother   . Alcohol abuse Maternal Grandfather   . Alcohol abuse Sister    History   Social History  . Marital Status: Married    Spouse Name: Dawn Powers    Number of Children: Dawn Powers  .  Years of Education: Dawn Powers   Occupational History  . Not on file.   Social History Main Topics  . Smoking status: Former Smoker -- 4 years    Types: Cigarettes    Quit date: 09/30/2001  . Smokeless tobacco: Never Used  . 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 Drug Use: No  . Sexual Activity: Yes    Partners: Female    Patent examiner Protection: None   Other Topics Concern  . Not on file   Social History Narrative   Married. Education: college and Other. Exercise 4 times a week for 50 minutes.   Allergies  Allergen Reactions  . Morphine And Related Hives    Review of Systems  Musculoskeletal: Positive for arthralgias and back pain.  Psychiatric/Behavioral: Positive for sleep disturbance.      Objective:  Physical Exam  Nursing note and vitals reviewed. Constitutional: She is oriented to person, place, and time. She  appears well-developed and well-nourished.  HENT:  Head: Normocephalic and atraumatic.  Right Ear: External ear normal.  Left Ear: External ear normal.  Nose: Nose normal.  Mouth/Throat: Oropharynx is clear and moist. No oropharyngeal exudate.  Eyes: Conjunctivae and EOM are normal. Pupils are equal, round, and reactive to light.  Neck: Normal range of motion. Neck supple. No thyromegaly present.  Cardiovascular: Normal rate, regular rhythm and normal heart sounds.  Exam reveals no gallop and no friction rub.   No murmur heard. Pulmonary/Chest: Effort normal and breath sounds normal. No respiratory distress. She has no wheezes. She has no rales.  Abdominal: Soft. Bowel sounds are normal. She exhibits no mass. There is no tenderness.  Musculoskeletal: Normal range of motion.  Lymphadenopathy:    She has no cervical adenopathy.  Neurological: She is alert and oriented to person, place, and time.  Skin: Skin is warm and dry.  Psychiatric: She has a normal mood and affect. Her behavior is normal.  ` there is a 3 x 4 mm firm area lower lip on the left. Results for orders placed in visit on 05/17/14  POCT URINALYSIS DIPSTICK      Result Value Ref Range   Color, UA yellow     Clarity, UA clear     Glucose, UA neg     Bilirubin, UA neg     Ketones, UA neg     Spec Grav, UA 1.015     Blood, UA neg     pH, UA 5.0     Protein, UA neg     Urobilinogen, UA 0.2     Nitrite, UA neg     Leukocytes, UA Negative       BP 127/64  Pulse 60  Temp(Src) 98 F (36.7 C) (Oral)  Resp 16  Ht 5\' 3"  (1.6 m)  Wt 128 lb 3.2 oz (58.151 kg)  BMI 22.72 kg/m2  SpO2 98% Assessment & Plan:  Patient plans to have a neurotomy done by Dr. Ernestina Patches. She was updated on her Prevnar vaccine referral made to Dr. Carlean Purl at up-to-date on her colonoscopy and referral made to Dr. caffeine regarding the lesion on her lip. She will make her own appointment with Dr. Carolynn Sayers. She sees her GYN doctor regularly.  I  personally performed the services described in this documentation, which was scribed in my presence. The recorded information has been reviewed and is accurate.

## 2014-05-17 NOTE — Progress Notes (Deleted)
   Subjective:    Patient ID: Dawn Powers, female    DOB: 12-Jan-1946, 68 y.o.   MRN: [id number]  HPI    Review of Systems  Constitutional: Positive for activity change.  HENT: Negative.   Eyes: Positive for photophobia.  Respiratory: Negative.   Cardiovascular: Negative.   Gastrointestinal: Positive for constipation.  Endocrine: Negative.   Genitourinary: Positive for flank pain.  Musculoskeletal: Positive for arthralgias, back pain, myalgias, neck pain and neck stiffness.  Skin: Negative.   Allergic/Immunologic: Negative.   Neurological: Positive for weakness and light-headedness.  Hematological: Negative.   Psychiatric/Behavioral: Positive for sleep disturbance.       Objective:   Physical Exam        Assessment & Plan:

## 2014-05-18 DIAGNOSIS — M2560 Stiffness of unspecified joint, not elsewhere classified: Secondary | ICD-10-CM | POA: Diagnosis not present

## 2014-05-18 DIAGNOSIS — M6281 Muscle weakness (generalized): Secondary | ICD-10-CM | POA: Diagnosis not present

## 2014-05-18 DIAGNOSIS — M25519 Pain in unspecified shoulder: Secondary | ICD-10-CM | POA: Diagnosis not present

## 2014-05-18 DIAGNOSIS — R293 Abnormal posture: Secondary | ICD-10-CM | POA: Diagnosis not present

## 2014-05-18 LAB — VITAMIN D 25 HYDROXY (VIT D DEFICIENCY, FRACTURES): Vit D, 25-Hydroxy: 41 ng/mL (ref 30–89)

## 2014-05-19 DIAGNOSIS — M47817 Spondylosis without myelopathy or radiculopathy, lumbosacral region: Secondary | ICD-10-CM | POA: Diagnosis not present

## 2014-05-19 DIAGNOSIS — M546 Pain in thoracic spine: Secondary | ICD-10-CM | POA: Diagnosis not present

## 2014-05-19 DIAGNOSIS — G894 Chronic pain syndrome: Secondary | ICD-10-CM | POA: Diagnosis not present

## 2014-05-25 DIAGNOSIS — R293 Abnormal posture: Secondary | ICD-10-CM | POA: Diagnosis not present

## 2014-05-25 DIAGNOSIS — M25519 Pain in unspecified shoulder: Secondary | ICD-10-CM | POA: Diagnosis not present

## 2014-05-25 DIAGNOSIS — M6281 Muscle weakness (generalized): Secondary | ICD-10-CM | POA: Diagnosis not present

## 2014-05-25 DIAGNOSIS — M2560 Stiffness of unspecified joint, not elsewhere classified: Secondary | ICD-10-CM | POA: Diagnosis not present

## 2014-05-30 ENCOUNTER — Ambulatory Visit: Payer: Medicare Other | Admitting: Physical Medicine & Rehabilitation

## 2014-06-01 DIAGNOSIS — M47814 Spondylosis without myelopathy or radiculopathy, thoracic region: Secondary | ICD-10-CM | POA: Diagnosis not present

## 2014-06-01 DIAGNOSIS — G894 Chronic pain syndrome: Secondary | ICD-10-CM | POA: Diagnosis not present

## 2014-06-01 DIAGNOSIS — M546 Pain in thoracic spine: Secondary | ICD-10-CM | POA: Diagnosis not present

## 2014-06-17 DIAGNOSIS — D235 Other benign neoplasm of skin of trunk: Secondary | ICD-10-CM | POA: Diagnosis not present

## 2014-06-17 DIAGNOSIS — L570 Actinic keratosis: Secondary | ICD-10-CM | POA: Diagnosis not present

## 2014-06-17 DIAGNOSIS — L821 Other seborrheic keratosis: Secondary | ICD-10-CM | POA: Diagnosis not present

## 2014-06-17 DIAGNOSIS — D485 Neoplasm of uncertain behavior of skin: Secondary | ICD-10-CM | POA: Diagnosis not present

## 2014-07-05 ENCOUNTER — Encounter: Payer: Medicare Other | Attending: Physical Medicine & Rehabilitation

## 2014-07-05 ENCOUNTER — Ambulatory Visit (HOSPITAL_BASED_OUTPATIENT_CLINIC_OR_DEPARTMENT_OTHER): Payer: Medicare Other | Admitting: Physical Medicine & Rehabilitation

## 2014-07-05 ENCOUNTER — Encounter: Payer: Self-pay | Admitting: Physical Medicine & Rehabilitation

## 2014-07-05 ENCOUNTER — Telehealth: Payer: Self-pay

## 2014-07-05 VITALS — HR 61 | Ht 63.0 in | Wt 137.0 lb

## 2014-07-05 DIAGNOSIS — M5384 Other specified dorsopathies, thoracic region: Secondary | ICD-10-CM

## 2014-07-05 DIAGNOSIS — G894 Chronic pain syndrome: Secondary | ICD-10-CM

## 2014-07-05 DIAGNOSIS — M47814 Spondylosis without myelopathy or radiculopathy, thoracic region: Principal | ICD-10-CM

## 2014-07-05 DIAGNOSIS — M4120 Other idiopathic scoliosis, site unspecified: Secondary | ICD-10-CM | POA: Insufficient documentation

## 2014-07-05 DIAGNOSIS — M546 Pain in thoracic spine: Secondary | ICD-10-CM | POA: Insufficient documentation

## 2014-07-05 DIAGNOSIS — M47894 Other spondylosis, thoracic region: Secondary | ICD-10-CM

## 2014-07-05 MED ORDER — GABAPENTIN 300 MG PO CAPS: ORAL_CAPSULE | ORAL | Status: DC

## 2014-07-05 NOTE — Patient Instructions (Signed)
Recommend T6-T7 medial branch blocks right side Dr. Ernestina Patches please call his office. Increase gabapentin to 300 mg 4 times per day

## 2014-07-05 NOTE — Telephone Encounter (Signed)
Refill for hydrocodone

## 2014-07-05 NOTE — Progress Notes (Signed)
Subjective:    Patient ID: Dawn Powers, female    DOB: December 02, 1945, 68 y.o.   MRN: [id number]  HPI  Initial initial visit 04/28/2014. Diagnosed with thoracic facet syndrome. Patient was referred to Dr. Ernestina Patches for thoracic medial branch blocks right T8 T9 T10-T11. 50% relief of pain for 2 weeks Patient then underwent right T8-T9 T10-T11 radiofrequency procedure,  Patient has had some modest pain relief after 4 weeks healing process. Feels that pain originates higher in the spine. Pressure on Right ribs seems to help with pain relief Pain does not follow activity patterns. She can tell whether she will have a painful day within the first few minutes of waking up  Reduced gabapentin and pain got worse, causes mouth dryness.   Pain Inventory Average Pain 8 Pain Right Now 8 My pain is intermittent, sharp, burning and stabbing  In the last 24 hours, has pain interfered with the following? General activity 7 Relation with others 7 Enjoyment of life 8 What TIME of day is your pain at its worst? morning and evening Sleep (in general) Fair  Pain is worse with: unsure Pain improves with: rest, heat/ice, pacing activities and medication Relief from Meds: 6  Mobility walk without assistance  Function retired  Neuro/Psych spasms  Prior Studies Any changes since last visit?  no  Physicians involved in your care Any changes since last visit?  no   Family History  Problem Relation Age of Onset  . Cancer Mother   . Heart disease Mother   . Alcohol abuse Mother   . Dementia Mother   . Cancer Father   . Alcohol abuse Father   . Diabetes Brother   . Alcohol abuse Maternal Grandfather   . Alcohol abuse Sister    History   Social History  . Marital Status: Married    Spouse Name: N/A    Number of Children: N/A  . Years of Education: N/A   Social History Main Topics  . Smoking status: Former Smoker -- 4 years    Types: Cigarettes    Quit date: 09/30/2001  .  Smokeless tobacco: Never Used  . Alcohol Use: 2.0 - 2.5 oz/week    4-5 drink(s) per week     Comment: WINE AND BOURBON - daily  . Drug Use: No  . Sexual Activity: Yes    Partners: Female    Patent examiner Protection: None   Other Topics Concern  . None   Social History Narrative   Married. Education: college and Other. Exercise 4 times a week for 50 minutes.   Past Surgical History  Procedure Laterality Date  . Fracture surgery      ankle   Past Medical History  Diagnosis Date  . Hyperlipidemia   . Arthritis   . Heart murmur    Pulse 61  Ht 5\' 3"  (1.6 m)  Wt 137 lb (62.143 kg)  BMI 24.27 kg/m2  SpO2 100%  Opioid Risk Score:   Fall Risk Score: Low Fall Risk (0-5 points)   Review of Systems     Objective:   Physical Exam  She is alert and oriented to person, place, and time. She has normal reflexes. She displays no atrophy and no tremor. No cranial nerve deficit or sensory deficit. She exhibits normal muscle tone. Coordination and gait normal.  Motor strength 5/5 bilateral deltoid, bicep, tricep, grip, hip flexor, knee extensors, ankle dorsiflexor plantar flexor Sensation intact to pinprick in bilateral C5 C6-C7 C8, T 1 L2 L3-L4  L5-S1 dermatomal distribution No pinprick loss over the posterior thoracic paraspinal area bilaterally  Psychiatric: She has a normal mood and affect. Her behavior is normal.   Dextroconvex scoliosis thoraco lumbar       Assessment & Plan:  1. Right posterior rib and mid back pain. 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 The first step would be to perform T6 and T7 medial branch blocks. This is to cover the pain that seems to originate above the previous denervated levels. If this is helpful would proceed with radiofrequency ablation, if not would recommend intercostal nerve blocks Long discussion with patient about her diagnosis as well as her treatment options. She brought up  cold RF which may be better tolerated but would not necessarily give a larger lesion size  In the meantime will increase in gabapentin to 300 mg 4 times per day  Over half of the 25 min visit was spent counseling and coordinating care.

## 2014-07-06 ENCOUNTER — Other Ambulatory Visit: Payer: Self-pay | Admitting: Emergency Medicine

## 2014-07-06 DIAGNOSIS — M542 Cervicalgia: Secondary | ICD-10-CM

## 2014-07-06 DIAGNOSIS — M546 Pain in thoracic spine: Secondary | ICD-10-CM

## 2014-07-06 DIAGNOSIS — M4120 Other idiopathic scoliosis, site unspecified: Secondary | ICD-10-CM

## 2014-07-06 MED ORDER — HYDROCODONE-ACETAMINOPHEN 5-325 MG PO TABS
1.0000 | ORAL_TABLET | Freq: Four times a day (QID) | ORAL | Status: DC | PRN
Start: 2014-07-06 — End: 2014-07-25

## 2014-07-06 NOTE — Telephone Encounter (Signed)
Call prescription ready for pickup

## 2014-07-06 NOTE — Telephone Encounter (Signed)
Notified pt ready. 

## 2014-07-15 ENCOUNTER — Other Ambulatory Visit: Payer: Self-pay

## 2014-07-19 ENCOUNTER — Ambulatory Visit (INDEPENDENT_AMBULATORY_CARE_PROVIDER_SITE_OTHER): Payer: Medicare Other | Admitting: *Deleted

## 2014-07-19 DIAGNOSIS — Z23 Encounter for immunization: Secondary | ICD-10-CM

## 2014-07-19 DIAGNOSIS — M546 Pain in thoracic spine: Secondary | ICD-10-CM | POA: Diagnosis not present

## 2014-07-19 DIAGNOSIS — M5414 Radiculopathy, thoracic region: Secondary | ICD-10-CM | POA: Diagnosis not present

## 2014-07-19 DIAGNOSIS — M9902 Segmental and somatic dysfunction of thoracic region: Secondary | ICD-10-CM | POA: Diagnosis not present

## 2014-07-19 DIAGNOSIS — M62830 Muscle spasm of back: Secondary | ICD-10-CM | POA: Diagnosis not present

## 2014-07-20 ENCOUNTER — Other Ambulatory Visit (INDEPENDENT_AMBULATORY_CARE_PROVIDER_SITE_OTHER): Payer: Medicare Other | Admitting: Family Medicine

## 2014-07-20 ENCOUNTER — Telehealth: Payer: Self-pay | Admitting: Radiology

## 2014-07-20 DIAGNOSIS — D72819 Decreased white blood cell count, unspecified: Secondary | ICD-10-CM

## 2014-07-20 DIAGNOSIS — M546 Pain in thoracic spine: Secondary | ICD-10-CM | POA: Diagnosis not present

## 2014-07-20 DIAGNOSIS — R7989 Other specified abnormal findings of blood chemistry: Secondary | ICD-10-CM

## 2014-07-20 DIAGNOSIS — R109 Unspecified abdominal pain: Secondary | ICD-10-CM

## 2014-07-20 DIAGNOSIS — D649 Anemia, unspecified: Secondary | ICD-10-CM

## 2014-07-20 DIAGNOSIS — R1010 Upper abdominal pain, unspecified: Secondary | ICD-10-CM | POA: Diagnosis not present

## 2014-07-20 LAB — COMPLETE METABOLIC PANEL WITH GFR
ALT: 16 U/L (ref 0–35)
AST: 23 U/L (ref 0–37)
Albumin: 4.4 g/dL (ref 3.5–5.2)
Alkaline Phosphatase: 54 U/L (ref 39–117)
BUN: 15 mg/dL (ref 6–23)
CO2: 29 mEq/L (ref 19–32)
Calcium: 9.3 mg/dL (ref 8.4–10.5)
Chloride: 104 mEq/L (ref 96–112)
Creat: 0.81 mg/dL (ref 0.50–1.10)
GFR, Est African American: 85 mL/min
GFR, Est Non African American: 74 mL/min
Glucose, Bld: 107 mg/dL — ABNORMAL HIGH (ref 70–99)
Potassium: 4.4 mEq/L (ref 3.5–5.3)
Sodium: 142 mEq/L (ref 135–145)
Total Bilirubin: 0.6 mg/dL (ref 0.2–1.2)
Total Protein: 6.5 g/dL (ref 6.0–8.3)

## 2014-07-20 LAB — POCT CBC
Granulocyte percent: 72.9 %G (ref 37–80)
HCT, POC: 41.7 % (ref 37.7–47.9)
Hemoglobin: 13.6 g/dL (ref 12.2–16.2)
Lymph, poc: 1.1 (ref 0.6–3.4)
MCH, POC: 31.7 pg — AB (ref 27–31.2)
MCHC: 32.6 g/dL (ref 31.8–35.4)
MCV: 97.5 fL — AB (ref 80–97)
MID (cbc): 3 — AB (ref 0–0.9)
MPV: 8.1 fL (ref 0–99.8)
POC Granulocyte: 3.4 (ref 2–6.9)
POC LYMPH PERCENT: 24.1 %L (ref 10–50)
POC MID %: 3 %M (ref 0–12)
Platelet Count, POC: 208 10*3/uL (ref 142–424)
RBC: 4.27 M/uL (ref 4.04–5.48)
RDW, POC: 14.6 %
WBC: 4.6 10*3/uL (ref 4.6–10.2)

## 2014-07-20 NOTE — Telephone Encounter (Signed)
CBC results are in system, normal FYI

## 2014-07-20 NOTE — Telephone Encounter (Signed)
Patient will come in today for the labs, she indicates her last visit with Dr Mysinger was a year ago, she will call and make a follow up appointment.

## 2014-07-20 NOTE — Telephone Encounter (Signed)
I also need to ask her if she is still seeing Dr Mysinger, GYN yearly

## 2014-07-20 NOTE — Telephone Encounter (Signed)
Patient will come in today for the labs. I called her back, forgot to ask her about Dr Mysinger, left another message for her to call me back.

## 2014-07-20 NOTE — Telephone Encounter (Signed)
Dr Everlene Farrier wants patient to come in for labs before her visit on Monday. I called her and left message for her to call me back.

## 2014-07-20 NOTE — Telephone Encounter (Signed)
She has come by for the labs, and she indicates she is due for the appt with Dr Mysinger in November, she will schedule for November.

## 2014-07-21 DIAGNOSIS — H2513 Age-related nuclear cataract, bilateral: Secondary | ICD-10-CM | POA: Diagnosis not present

## 2014-07-21 DIAGNOSIS — H43813 Vitreous degeneration, bilateral: Secondary | ICD-10-CM | POA: Diagnosis not present

## 2014-07-21 LAB — CBC WITH DIFFERENTIAL/PLATELET
Basophils Absolute: 0 10*3/uL (ref 0.0–0.1)
Basophils Relative: 0 % (ref 0–1)
Eosinophils Absolute: 0.1 10*3/uL (ref 0.0–0.7)
Eosinophils Relative: 2 % (ref 0–5)
HCT: 39.3 % (ref 36.0–46.0)
Hemoglobin: 13.4 g/dL (ref 12.0–15.0)
Lymphocytes Relative: 21 % (ref 12–46)
Lymphs Abs: 0.9 10*3/uL (ref 0.7–4.0)
MCH: 31.9 pg (ref 26.0–34.0)
MCHC: 34.1 g/dL (ref 30.0–36.0)
MCV: 93.6 fL (ref 78.0–100.0)
Monocytes Absolute: 0.4 10*3/uL (ref 0.1–1.0)
Monocytes Relative: 8 % (ref 3–12)
Neutro Abs: 3.1 10*3/uL (ref 1.7–7.7)
Neutrophils Relative %: 69 % (ref 43–77)
Platelets: 229 10*3/uL (ref 150–400)
RBC: 4.2 MIL/uL (ref 3.87–5.11)
RDW: 13.9 % (ref 11.5–15.5)
WBC: 4.5 10*3/uL (ref 4.0–10.5)

## 2014-07-21 LAB — SEDIMENTATION RATE: Sed Rate: 10 mm/hr (ref 0–22)

## 2014-07-22 LAB — PROTEIN ELECTROPHORESIS, SERUM
Albumin ELP: 63.3 % (ref 55.8–66.1)
Alpha-1-Globulin: 4.7 % (ref 2.9–4.9)
Alpha-2-Globulin: 11.4 % (ref 7.1–11.8)
Beta 2: 4.2 % (ref 3.2–6.5)
Beta Globulin: 5.7 % (ref 4.7–7.2)
Gamma Globulin: 10.7 % — ABNORMAL LOW (ref 11.1–18.8)
Total Protein, Serum Electrophoresis: 6.5 g/dL (ref 6.0–8.3)

## 2014-07-22 LAB — IMMUNOFIXATION ELECTROPHORESIS
IgA: 137 mg/dL (ref 69–380)
IgG (Immunoglobin G), Serum: 660 mg/dL — ABNORMAL LOW (ref 690–1700)
IgM, Serum: 114 mg/dL (ref 52–322)
Total Protein, Serum Electrophoresis: 6.5 g/dL (ref 6.0–8.3)

## 2014-07-25 ENCOUNTER — Ambulatory Visit (INDEPENDENT_AMBULATORY_CARE_PROVIDER_SITE_OTHER): Payer: Medicare Other | Admitting: Emergency Medicine

## 2014-07-25 VITALS — BP 143/63 | HR 67 | Temp 97.9°F | Resp 17 | Ht 63.5 in | Wt 130.0 lb

## 2014-07-25 DIAGNOSIS — Z7189 Other specified counseling: Secondary | ICD-10-CM

## 2014-07-25 DIAGNOSIS — M4120 Other idiopathic scoliosis, site unspecified: Secondary | ICD-10-CM | POA: Diagnosis not present

## 2014-07-25 DIAGNOSIS — M546 Pain in thoracic spine: Secondary | ICD-10-CM | POA: Diagnosis not present

## 2014-07-25 DIAGNOSIS — M542 Cervicalgia: Secondary | ICD-10-CM | POA: Diagnosis not present

## 2014-07-25 MED ORDER — HYDROCODONE-ACETAMINOPHEN 5-325 MG PO TABS: 1.0000 | ORAL_TABLET | Freq: Four times a day (QID) | ORAL | Status: DC | PRN

## 2014-07-25 NOTE — Progress Notes (Signed)
Subjective:    Patient ID: Dawn Powers, female    DOB: 1946/01/24, 68 y.o.   MRN: [id number]  HPI  Mrs. Riffe is a 68 y.o. female with a pmh of chronic back pain, scoliosis and degenerative disk disease presenting for follow up on her back pain. Patient states that pain began 1.5 years ago without any precipitant, started out as a burning sensation in her midback. Pain was intermittent, varied in severity, relieved with laying down or elevating feet. As her pain became progressively worse she sought evaluation. Unfortunately, since then she failed multiple trials of treatment for her pain including NSAIDs, steroids, muscle relaxants, physical therapy, radioablation. A thorough evaluation has also been performed and failed to show a single source for her pain. However, MRI scans of her spine showed scoliosis and degenerative changes. Currently, Mrs. Brazeau pain is managed with Hydrocodone which provides moderate relief. Patient supplements that with ongoing PT, chiropractor visits and yoga. Today, she reports difficulty with her husband. She states that both have a difficult time coping with her back pain, occasionally both become overwhelmed with her circumstances and she feels guilty for what they are going through. Denies any other aggravating or relieving factors, no other questions or concerns.   Prior to Admission medications   Medication Sig Start Date End Date Taking? Authorizing Provider  gabapentin (NEURONTIN) 300 MG capsule Take one tablet in the morning one tablet early afternoon and one tablet at bed time 07/05/14  Yes Charlett Blake, MD  HYDROcodone-acetaminophen (NORCO) 5-325 MG per tablet Take 1 tablet by mouth every 6 (six) hours as needed for moderate pain. Take 1-1/2 tablets twice a day 07/06/14  Yes Darlyne Russian, MD  multivitamin-lutein [facility]) CAPS capsule Take 1 capsule by mouth daily.   Yes Historical Provider, MD  Propylene Glycol (SYSTANE BALANCE OP) Apply  to eye 3 (three) times daily.   Yes Historical Provider, MD    Allergies  Allergen Reactions  . Morphine And Related Hives    Past Medical History  Diagnosis Date  . Hyperlipidemia   . Arthritis   . Heart murmur     Past Surgical History  Procedure Laterality Date  . Fracture surgery      ankle    Family History  Problem Relation Age of Onset  . Cancer Mother   . Heart disease Mother   . Alcohol abuse Mother   . Dementia Mother   . Cancer Father   . Alcohol abuse Father   . Diabetes Brother   . Alcohol abuse Maternal Grandfather   . Alcohol abuse Sister     History  Substance Use Topics  . Smoking status: Former Smoker -- 4 years    Types: Cigarettes    Quit date: 09/30/2001  . Smokeless tobacco: Never Used  . Alcohol Use: 2.0 - 2.5 oz/week    4-5 drink(s) per week     Comment: WINE AND BOURBON - daily    Review of Systems As in subjective.    Objective:   Physical Exam  Vitals reviewed. Constitutional: She is oriented to person, place, and time. She appears well-developed and well-nourished. No distress.  BP 143/63  Pulse 67  Temp(Src) 97.9 F (36.6 C)  Resp 17  Ht 5' 3.5" (1.613 m)  Wt 130 lb (58.968 kg)  BMI 22.66 kg/m2  SpO2 97%   HENT:  Head: Normocephalic and atraumatic.  Neck: Normal range of motion. Neck supple.  Cardiovascular: Normal rate, regular rhythm, normal heart  sounds and intact distal pulses.  Exam reveals no gallop and no friction rub.   No murmur heard. Pulmonary/Chest: Effort normal and breath sounds normal. No respiratory distress. She has no wheezes.  Musculoskeletal: Normal range of motion. She exhibits no edema and no tenderness.  Neurological: She is alert and oriented to person, place, and time. She has normal reflexes. No cranial nerve deficit. Coordination normal.  Skin: Skin is warm and dry. No rash noted. She is not diaphoretic. No erythema.  Psychiatric:  Patient is standing in exam room, appears somewhat anxious  and restless.       Assessment & Plan:   1. Neck pain 2. Scoliosis (and kyphoscoliosis), idiopathic 3. Thoracic back pain, unspecified back pain laterality 4. Pain management counseling, encounter for Unchanged, continue pain management with Norco, Chiropractor visits, PT exercises and yoga, referred to Psychologist for counseling with/without husband for better coping with her chronic back pain. - HYDROcodone-acetaminophen (NORCO) 5-325 MG per tablet; Take 1 tablet by mouth every 6 (six) hours as needed for moderate pain. Take 1-1/2 tablets twice a day  Dispense: 120 tablet; Refill: 0 - Ambulatory referral to Psychology   Jaynee Eagles, PA-C [facility] [phone] 07/25/2014 12:51 PM

## 2014-07-26 DIAGNOSIS — M5414 Radiculopathy, thoracic region: Secondary | ICD-10-CM | POA: Diagnosis not present

## 2014-07-26 DIAGNOSIS — M546 Pain in thoracic spine: Secondary | ICD-10-CM | POA: Diagnosis not present

## 2014-07-26 DIAGNOSIS — M9902 Segmental and somatic dysfunction of thoracic region: Secondary | ICD-10-CM | POA: Diagnosis not present

## 2014-07-26 DIAGNOSIS — M62830 Muscle spasm of back: Secondary | ICD-10-CM | POA: Diagnosis not present

## 2014-07-29 DIAGNOSIS — M5414 Radiculopathy, thoracic region: Secondary | ICD-10-CM | POA: Diagnosis not present

## 2014-07-29 DIAGNOSIS — M546 Pain in thoracic spine: Secondary | ICD-10-CM | POA: Diagnosis not present

## 2014-07-29 DIAGNOSIS — M9902 Segmental and somatic dysfunction of thoracic region: Secondary | ICD-10-CM | POA: Diagnosis not present

## 2014-07-29 DIAGNOSIS — M62830 Muscle spasm of back: Secondary | ICD-10-CM | POA: Diagnosis not present

## 2014-08-04 ENCOUNTER — Encounter: Payer: Self-pay | Admitting: Physical Medicine & Rehabilitation

## 2014-08-04 ENCOUNTER — Encounter: Payer: Self-pay | Admitting: Emergency Medicine

## 2014-08-04 ENCOUNTER — Ambulatory Visit (HOSPITAL_BASED_OUTPATIENT_CLINIC_OR_DEPARTMENT_OTHER): Payer: Medicare Other | Admitting: Physical Medicine & Rehabilitation

## 2014-08-04 ENCOUNTER — Encounter: Payer: Medicare Other | Attending: Physical Medicine & Rehabilitation

## 2014-08-04 VITALS — BP 150/81 | HR 64 | Resp 14 | Ht 63.5 in | Wt 131.0 lb

## 2014-08-04 DIAGNOSIS — M25511 Pain in right shoulder: Secondary | ICD-10-CM | POA: Diagnosis present

## 2014-08-04 DIAGNOSIS — E785 Hyperlipidemia, unspecified: Secondary | ICD-10-CM | POA: Diagnosis not present

## 2014-08-04 DIAGNOSIS — M546 Pain in thoracic spine: Secondary | ICD-10-CM | POA: Diagnosis not present

## 2014-08-04 DIAGNOSIS — M47894 Other spondylosis, thoracic region: Secondary | ICD-10-CM

## 2014-08-04 DIAGNOSIS — M1990 Unspecified osteoarthritis, unspecified site: Secondary | ICD-10-CM | POA: Diagnosis not present

## 2014-08-04 DIAGNOSIS — M5384 Other specified dorsopathies, thoracic region: Secondary | ICD-10-CM

## 2014-08-04 DIAGNOSIS — G894 Chronic pain syndrome: Secondary | ICD-10-CM

## 2014-08-04 DIAGNOSIS — R0789 Other chest pain: Secondary | ICD-10-CM | POA: Diagnosis not present

## 2014-08-04 DIAGNOSIS — Z87891 Personal history of nicotine dependence: Secondary | ICD-10-CM | POA: Diagnosis not present

## 2014-08-04 DIAGNOSIS — M4120 Other idiopathic scoliosis, site unspecified: Secondary | ICD-10-CM | POA: Diagnosis not present

## 2014-08-04 DIAGNOSIS — M47814 Spondylosis without myelopathy or radiculopathy, thoracic region: Secondary | ICD-10-CM

## 2014-08-04 MED ORDER — LIDOCAINE 5 % EX OINT: 1.0000 "application " | TOPICAL_OINTMENT | Freq: Two times a day (BID) | CUTANEOUS | Status: DC | PRN

## 2014-08-04 MED ORDER — KETOROLAC TROMETHAMINE 30 MG/ML IJ SOLN: 30.0000 mg | Freq: Once | INTRAMUSCULAR | Status: DC

## 2014-08-04 MED ORDER — KETOROLAC TROMETHAMINE 60 MG/2ML IM SOLN
30.0000 mg | Freq: Once | INTRAMUSCULAR | Status: AC
  Administered 2014-08-04: 30 mg via INTRAMUSCULAR

## 2014-08-04 NOTE — Progress Notes (Signed)
Subjective:    Patient ID: Dawn Powers, female    DOB: 12-Sep-1946, 68 y.o.   MRN: [id number] Right-sided pain medial to lower area of the scapula. Pain does wrap around the ribs radiating toward the axilla HPI Variable pain level.  Hydrocodone 1/2 tab about 3 times a day  Baclofen in the past as well as cyclobenzaprine without Tried voltaren gel in past   Equivocal results from prior thoracic medial branch blocks at T8 T9 T10 levels.  Patient thinks that much of her pain is higher toward the scapular area  Seeing Chiropracter doing some adjustments as well as needling  Reaching forward and down with Right arm aggravates pain the most  Pain Inventory Average Pain 9 Pain Right Now 6 My pain is intermittent, sharp and stabbing  In the last 24 hours, has pain interfered with the following? General activity 7 Relation with others 4 Enjoyment of life 7 What TIME of day is your pain at its worst? morning, nigh Sleep (in general) Fair  Pain is worse with: bending and inactivity Pain improves with: rest, therapy/exercise, pacing activities and medication Relief from Meds: 7  Mobility walk without assistance how many minutes can you walk? 30 ability to climb steps?  yes do you drive?  yes Do you have any goals in this area?  yes  Function retired I need assistance with the following:  meal prep and household duties Do you have any goals in this area?  yes  Neuro/Psych numbness spasms depression  Prior Studies Any changes since last visit?  no  Physicians involved in your care Any changes since last visit?  no   Family History  Problem Relation Age of Onset  . Cancer Mother   . Heart disease Mother   . Alcohol abuse Mother   . Dementia Mother   . Cancer Father   . Alcohol abuse Father   . Diabetes Brother   . Alcohol abuse Maternal Grandfather   . Alcohol abuse Sister    History   Social History  . Marital Status: Married    Spouse Name: N/A   Number of Children: N/A  . Years of Education: N/A   Social History Main Topics  . Smoking status: Former Smoker -- 4 years    Types: Cigarettes    Quit date: 09/30/2001  . Smokeless tobacco: Never Used  . Alcohol Use: 2.0 - 2.5 oz/week    4-5 drink(s) per week     Comment: WINE AND BOURBON - daily  . Drug Use: No  . Sexual Activity:    Partners: Female    Museum/gallery curator: None   Other Topics Concern  . None   Social History Narrative   Married. Education: college and Other. Exercise 4 times a week for 50 minutes.   Past Surgical History  Procedure Laterality Date  . Fracture surgery      ankle   Past Medical History  Diagnosis Date  . Hyperlipidemia   . Arthritis   . Heart murmur    There were no vitals taken for this visit.  Opioid Risk Score:   Fall Risk Score: Low Fall Risk (0-5 points) Review of Systems     Objective:   Physical Exam  Constitutional: She is oriented to person, place, and time. She appears well-developed and well-nourished.  Musculoskeletal:       Cervical back: Normal.       Thoracic back: Normal.       Lumbar back: Normal.  Neurological: She is alert and oriented to person, place, and time. She has normal strength. Gait normal.  Skin: Skin is warm, dry and intact.  No lesions around the rib area on the right side  Psychiatric: Her mood appears anxious. She is agitated.  Vitals reviewed.  Pacing about room, stretching , changing position Full lumbar  And thoracic ROM  Mild dextroconvex thoracic scoliosis      Assessment & Plan:  1. Posterior chest wall pain. Some features suggest thoracic facet and other features suggesting intercostal neuralgia. Unable to reproduce this pain with either movement or during palpation. General region of pain is just lateral to the spinous process of T6, T7, T8, this has shown some variability as well. We discussed plan of action including repeat medial branch blocks by Dr. Ernestina Patches. Would  recommend T6, T7, T8 levels  2. If No response from thoracic medial branch blocks, would do intercostal nerve blocks to evaluate for intercostal neuralgia  We'll continue gabapentin for the time being Trial of lidocaine cream 4% placed on a area no more than 4 inches in diameter  We'll give Toradol injection 30 mg IM today

## 2014-08-04 NOTE — Patient Instructions (Signed)
Do not exceed a 4 inch diameter application  Call Dr Ernestina Patches for repeat Throacic medial branch block  He can consider T6,7,8,9 levels on the right

## 2014-08-10 DIAGNOSIS — M62830 Muscle spasm of back: Secondary | ICD-10-CM | POA: Diagnosis not present

## 2014-08-10 DIAGNOSIS — M5414 Radiculopathy, thoracic region: Secondary | ICD-10-CM | POA: Diagnosis not present

## 2014-08-10 DIAGNOSIS — M9902 Segmental and somatic dysfunction of thoracic region: Secondary | ICD-10-CM | POA: Diagnosis not present

## 2014-08-10 DIAGNOSIS — M546 Pain in thoracic spine: Secondary | ICD-10-CM | POA: Diagnosis not present

## 2014-08-15 DIAGNOSIS — M9902 Segmental and somatic dysfunction of thoracic region: Secondary | ICD-10-CM | POA: Diagnosis not present

## 2014-08-15 DIAGNOSIS — M62830 Muscle spasm of back: Secondary | ICD-10-CM | POA: Diagnosis not present

## 2014-08-15 DIAGNOSIS — M5414 Radiculopathy, thoracic region: Secondary | ICD-10-CM | POA: Diagnosis not present

## 2014-08-15 DIAGNOSIS — M546 Pain in thoracic spine: Secondary | ICD-10-CM | POA: Diagnosis not present

## 2014-08-18 DIAGNOSIS — M9902 Segmental and somatic dysfunction of thoracic region: Secondary | ICD-10-CM | POA: Diagnosis not present

## 2014-08-18 DIAGNOSIS — M5414 Radiculopathy, thoracic region: Secondary | ICD-10-CM | POA: Diagnosis not present

## 2014-08-18 DIAGNOSIS — M62830 Muscle spasm of back: Secondary | ICD-10-CM | POA: Diagnosis not present

## 2014-08-18 DIAGNOSIS — M546 Pain in thoracic spine: Secondary | ICD-10-CM | POA: Diagnosis not present

## 2014-08-22 ENCOUNTER — Telehealth: Payer: Self-pay | Admitting: Physical Medicine & Rehabilitation

## 2014-08-22 DIAGNOSIS — M47814 Spondylosis without myelopathy or radiculopathy, thoracic region: Secondary | ICD-10-CM

## 2014-08-22 DIAGNOSIS — M4120 Other idiopathic scoliosis, site unspecified: Secondary | ICD-10-CM

## 2014-08-22 DIAGNOSIS — M546 Pain in thoracic spine: Secondary | ICD-10-CM

## 2014-08-22 DIAGNOSIS — M47894 Other spondylosis, thoracic region: Secondary | ICD-10-CM

## 2014-08-22 NOTE — Telephone Encounter (Signed)
  Recommend repeat medial branch blocks by Dr. Ernestina Patches. Would recommend  Right T6, T7, T8 levels Please send referral

## 2014-08-22 NOTE — Telephone Encounter (Signed)
Dr. Letta Pate stated she needed to see Dr. Ernestina Patches. When she called Dr. Romona Curls office, they stated they needed to hear from Dr. Letta Pate or he could send a referral and notes.

## 2014-08-23 DIAGNOSIS — M9902 Segmental and somatic dysfunction of thoracic region: Secondary | ICD-10-CM | POA: Diagnosis not present

## 2014-08-23 DIAGNOSIS — M5414 Radiculopathy, thoracic region: Secondary | ICD-10-CM | POA: Diagnosis not present

## 2014-08-23 DIAGNOSIS — M62830 Muscle spasm of back: Secondary | ICD-10-CM | POA: Diagnosis not present

## 2014-08-23 DIAGNOSIS — M546 Pain in thoracic spine: Secondary | ICD-10-CM | POA: Diagnosis not present

## 2014-08-24 DIAGNOSIS — N841 Polyp of cervix uteri: Secondary | ICD-10-CM | POA: Diagnosis not present

## 2014-08-24 DIAGNOSIS — Z01419 Encounter for gynecological examination (general) (routine) without abnormal findings: Secondary | ICD-10-CM | POA: Diagnosis not present

## 2014-08-24 NOTE — Telephone Encounter (Signed)
Referral order placed.

## 2014-08-24 NOTE — Telephone Encounter (Deleted)
  Recommend repeat medial branch blocks by Dr. Ernestina Patches. Would recommend  Right T6, T7, T8 levels

## 2014-08-30 ENCOUNTER — Ambulatory Visit: Payer: Medicare Other | Admitting: Physical Medicine & Rehabilitation

## 2014-08-30 DIAGNOSIS — M9902 Segmental and somatic dysfunction of thoracic region: Secondary | ICD-10-CM | POA: Diagnosis not present

## 2014-08-30 DIAGNOSIS — M5414 Radiculopathy, thoracic region: Secondary | ICD-10-CM | POA: Diagnosis not present

## 2014-08-30 DIAGNOSIS — M62830 Muscle spasm of back: Secondary | ICD-10-CM | POA: Diagnosis not present

## 2014-08-30 DIAGNOSIS — M546 Pain in thoracic spine: Secondary | ICD-10-CM | POA: Diagnosis not present

## 2014-09-05 DIAGNOSIS — M546 Pain in thoracic spine: Secondary | ICD-10-CM | POA: Diagnosis not present

## 2014-09-05 DIAGNOSIS — G894 Chronic pain syndrome: Secondary | ICD-10-CM | POA: Diagnosis not present

## 2014-09-05 DIAGNOSIS — M47814 Spondylosis without myelopathy or radiculopathy, thoracic region: Secondary | ICD-10-CM | POA: Diagnosis not present

## 2014-09-06 DIAGNOSIS — M9902 Segmental and somatic dysfunction of thoracic region: Secondary | ICD-10-CM | POA: Diagnosis not present

## 2014-09-06 DIAGNOSIS — M546 Pain in thoracic spine: Secondary | ICD-10-CM | POA: Diagnosis not present

## 2014-09-06 DIAGNOSIS — M5414 Radiculopathy, thoracic region: Secondary | ICD-10-CM | POA: Diagnosis not present

## 2014-09-06 DIAGNOSIS — M62830 Muscle spasm of back: Secondary | ICD-10-CM | POA: Diagnosis not present

## 2014-09-09 DIAGNOSIS — M546 Pain in thoracic spine: Secondary | ICD-10-CM | POA: Diagnosis not present

## 2014-09-09 DIAGNOSIS — M9902 Segmental and somatic dysfunction of thoracic region: Secondary | ICD-10-CM | POA: Diagnosis not present

## 2014-09-09 DIAGNOSIS — M62830 Muscle spasm of back: Secondary | ICD-10-CM | POA: Diagnosis not present

## 2014-09-09 DIAGNOSIS — M5414 Radiculopathy, thoracic region: Secondary | ICD-10-CM | POA: Diagnosis not present

## 2014-09-15 DIAGNOSIS — M9902 Segmental and somatic dysfunction of thoracic region: Secondary | ICD-10-CM | POA: Diagnosis not present

## 2014-09-15 DIAGNOSIS — M5414 Radiculopathy, thoracic region: Secondary | ICD-10-CM | POA: Diagnosis not present

## 2014-09-15 DIAGNOSIS — M62830 Muscle spasm of back: Secondary | ICD-10-CM | POA: Diagnosis not present

## 2014-09-15 DIAGNOSIS — M546 Pain in thoracic spine: Secondary | ICD-10-CM | POA: Diagnosis not present

## 2014-09-21 DIAGNOSIS — M546 Pain in thoracic spine: Secondary | ICD-10-CM | POA: Diagnosis not present

## 2014-09-21 DIAGNOSIS — M5414 Radiculopathy, thoracic region: Secondary | ICD-10-CM | POA: Diagnosis not present

## 2014-09-21 DIAGNOSIS — M9902 Segmental and somatic dysfunction of thoracic region: Secondary | ICD-10-CM | POA: Diagnosis not present

## 2014-09-21 DIAGNOSIS — M62830 Muscle spasm of back: Secondary | ICD-10-CM | POA: Diagnosis not present

## 2014-10-04 DIAGNOSIS — M546 Pain in thoracic spine: Secondary | ICD-10-CM | POA: Diagnosis not present

## 2014-10-04 DIAGNOSIS — M62830 Muscle spasm of back: Secondary | ICD-10-CM | POA: Diagnosis not present

## 2014-10-04 DIAGNOSIS — M9902 Segmental and somatic dysfunction of thoracic region: Secondary | ICD-10-CM | POA: Diagnosis not present

## 2014-10-04 DIAGNOSIS — M5414 Radiculopathy, thoracic region: Secondary | ICD-10-CM | POA: Diagnosis not present

## 2014-10-18 DIAGNOSIS — M9902 Segmental and somatic dysfunction of thoracic region: Secondary | ICD-10-CM | POA: Diagnosis not present

## 2014-10-18 DIAGNOSIS — M5414 Radiculopathy, thoracic region: Secondary | ICD-10-CM | POA: Diagnosis not present

## 2014-10-18 DIAGNOSIS — M62830 Muscle spasm of back: Secondary | ICD-10-CM | POA: Diagnosis not present

## 2014-10-18 DIAGNOSIS — M546 Pain in thoracic spine: Secondary | ICD-10-CM | POA: Diagnosis not present

## 2014-10-25 DIAGNOSIS — M5414 Radiculopathy, thoracic region: Secondary | ICD-10-CM | POA: Diagnosis not present

## 2014-10-25 DIAGNOSIS — M9902 Segmental and somatic dysfunction of thoracic region: Secondary | ICD-10-CM | POA: Diagnosis not present

## 2014-10-25 DIAGNOSIS — M62830 Muscle spasm of back: Secondary | ICD-10-CM | POA: Diagnosis not present

## 2014-10-25 DIAGNOSIS — M546 Pain in thoracic spine: Secondary | ICD-10-CM | POA: Diagnosis not present

## 2014-11-02 ENCOUNTER — Encounter: Payer: Self-pay | Admitting: Emergency Medicine

## 2014-11-02 DIAGNOSIS — M5414 Radiculopathy, thoracic region: Secondary | ICD-10-CM | POA: Diagnosis not present

## 2014-11-02 DIAGNOSIS — M546 Pain in thoracic spine: Secondary | ICD-10-CM | POA: Diagnosis not present

## 2014-11-02 DIAGNOSIS — M9902 Segmental and somatic dysfunction of thoracic region: Secondary | ICD-10-CM | POA: Diagnosis not present

## 2014-11-02 DIAGNOSIS — M62830 Muscle spasm of back: Secondary | ICD-10-CM | POA: Diagnosis not present

## 2014-11-08 ENCOUNTER — Encounter: Payer: Self-pay | Admitting: Emergency Medicine

## 2014-11-08 ENCOUNTER — Ambulatory Visit (INDEPENDENT_AMBULATORY_CARE_PROVIDER_SITE_OTHER): Payer: Medicare Other | Admitting: Emergency Medicine

## 2014-11-08 VITALS — BP 143/81 | HR 57 | Temp 97.7°F | Resp 16 | Ht 63.75 in | Wt 131.6 lb

## 2014-11-08 DIAGNOSIS — G894 Chronic pain syndrome: Secondary | ICD-10-CM

## 2014-11-08 DIAGNOSIS — M4120 Other idiopathic scoliosis, site unspecified: Secondary | ICD-10-CM | POA: Diagnosis not present

## 2014-11-08 DIAGNOSIS — M542 Cervicalgia: Secondary | ICD-10-CM

## 2014-11-08 DIAGNOSIS — M546 Pain in thoracic spine: Secondary | ICD-10-CM | POA: Diagnosis not present

## 2014-11-08 DIAGNOSIS — M5030 Other cervical disc degeneration, unspecified cervical region: Secondary | ICD-10-CM | POA: Diagnosis not present

## 2014-11-08 DIAGNOSIS — M5134 Other intervertebral disc degeneration, thoracic region: Secondary | ICD-10-CM | POA: Diagnosis not present

## 2014-11-08 NOTE — Patient Instructions (Signed)
-   Let's try cutting down Neurontin to twice a day. Keep count of how much pain medication you are using. - For any constipation you experience, you can try Miralax or Dulcolax, both are over-the-counter.

## 2014-11-08 NOTE — Progress Notes (Signed)
    MRN: [id number] DOB: Jul 05, 1946  Subjective:   Dawn Powers is a 69 y.o. female presenting for chief complaint of Back Pain  At the end of Dec 2015, patient had an episode of back pain and anxiety. Husband was out of town, patient was snowed in and states that she had a difficult time coping, asked to schedule an appointment with Dr. Everlene Farrier. She presents today, stating that she is doing significantly better. She admits that the episode in Dec 2015 was isolated. At her last visit in 06/2014, patient was having pain daily, using Norco daily, Neurotin $RemoveBeforeD'900mg'[id number]$  QD. Since then she has increased her activity, is doing yoga more often, started tai chi, continued visits with chiropractor. She feels she is doing much better, uses Norco 1-2x weekly and has significant relief of pain with its use. Patient admits that yoga has helped her tremendously, states that she may start a session in pain but feels better afterwards. She is still going to chiropractor but does not need visits as often as before. Patient would like to discuss stopping Neurontin, asked about options for constipation which she experiences intermittently, Dulcolax has helped. Of note, patient's mood has significantly improved. She states that she never received a call to schedule psychiatrist visit. At this point would like to hold off on this. Denies any other aggravating or relieving factors, no other questions or concerns.  Dawn Powers has a current medication list which includes the following prescription(s): gabapentin, hydrocodone-acetaminophen, multivitamin-lutein, propylene glycol, and lidocaine.  She is allergic to morphine and related.  Dawn Powers  has a past medical history of Hyperlipidemia; Arthritis; and Heart murmur. Also  has past surgical history that includes Fracture surgery.  ROS As in subjective.  Objective:   Vitals: BP 143/81 mmHg  Pulse 57  Temp(Src) 97.7 F (36.5 C) (Oral)  Resp 16  Ht 5' 3.75" (1.619 m)  Wt 131 lb 9.6 oz  (59.693 kg)  BMI 22.77 kg/m2  SpO2 100%  Physical Exam  Constitutional: She is oriented to person, place, and time and well-developed, well-nourished, and in no distress.  Cardiovascular: Normal rate.   Pulmonary/Chest: Effort normal.  Neurological: She is alert and oriented to person, place, and time. She has normal reflexes.  Skin: Skin is warm and dry. No rash noted. No erythema.   Assessment and Plan :   1. Neck pain 2. Thoracic back pain, unspecified back pain laterality 3. Scoliosis (and kyphoscoliosis), idiopathic 4. 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 Chronic pain syndrome - Significantly improved pain symptoms, degenerative changes stable, will hold off on repeat imaging. - Will decrease Neurontin to twice daily ($RemoveBefo'600mg'ZVmvfJKEKUC$  total) - Advised to use Miralax and/or Dulcolax for intermittent constipation associated with opioid use. - Follow up in 6 months at annual physical exam.  Dawn Eagles, PA-C [facility] [phone] 11/08/2014 11:42 AM

## 2014-11-16 DIAGNOSIS — M9902 Segmental and somatic dysfunction of thoracic region: Secondary | ICD-10-CM | POA: Diagnosis not present

## 2014-11-16 DIAGNOSIS — M5414 Radiculopathy, thoracic region: Secondary | ICD-10-CM | POA: Diagnosis not present

## 2014-11-16 DIAGNOSIS — M62830 Muscle spasm of back: Secondary | ICD-10-CM | POA: Diagnosis not present

## 2014-11-16 DIAGNOSIS — M546 Pain in thoracic spine: Secondary | ICD-10-CM | POA: Diagnosis not present

## 2014-11-29 ENCOUNTER — Telehealth: Payer: Self-pay

## 2014-11-29 DIAGNOSIS — M546 Pain in thoracic spine: Secondary | ICD-10-CM

## 2014-11-29 NOTE — Telephone Encounter (Signed)
Referral in. She also wanted to let Dr. Everlene Farrier know she is going to see Adaline Sill for physical therapy also. FYI

## 2014-11-29 NOTE — Telephone Encounter (Signed)
Yes please call her and get all of the information and make that referral to the [facility] in [facility]

## 2014-11-29 NOTE — Telephone Encounter (Signed)
Pt states she is in a lot of pain,is thinking of going to [facility],wants dr Caren Griffins opinion  Best phone for pt is [phone]

## 2014-11-29 NOTE — Telephone Encounter (Signed)
Called pt to get more details, she states her back is hurting so bad that even Hydrocodone is not masking the pain. She wants to proceed to see [facility] in [place], [place]. Can we send in referral for her?

## 2014-11-30 DIAGNOSIS — M62830 Muscle spasm of back: Secondary | ICD-10-CM | POA: Diagnosis not present

## 2014-11-30 DIAGNOSIS — M9902 Segmental and somatic dysfunction of thoracic region: Secondary | ICD-10-CM | POA: Diagnosis not present

## 2014-11-30 DIAGNOSIS — M5414 Radiculopathy, thoracic region: Secondary | ICD-10-CM | POA: Diagnosis not present

## 2014-11-30 DIAGNOSIS — M546 Pain in thoracic spine: Secondary | ICD-10-CM | POA: Diagnosis not present

## 2014-12-02 DIAGNOSIS — G894 Chronic pain syndrome: Secondary | ICD-10-CM | POA: Diagnosis not present

## 2014-12-14 DIAGNOSIS — M5414 Radiculopathy, thoracic region: Secondary | ICD-10-CM | POA: Diagnosis not present

## 2014-12-14 DIAGNOSIS — M546 Pain in thoracic spine: Secondary | ICD-10-CM | POA: Diagnosis not present

## 2014-12-14 DIAGNOSIS — M9902 Segmental and somatic dysfunction of thoracic region: Secondary | ICD-10-CM | POA: Diagnosis not present

## 2014-12-14 DIAGNOSIS — M62830 Muscle spasm of back: Secondary | ICD-10-CM | POA: Diagnosis not present

## 2014-12-21 ENCOUNTER — Telehealth: Payer: Self-pay | Admitting: *Deleted

## 2014-12-21 ENCOUNTER — Ambulatory Visit (INDEPENDENT_AMBULATORY_CARE_PROVIDER_SITE_OTHER): Payer: Medicare Other | Admitting: Emergency Medicine

## 2014-12-21 ENCOUNTER — Other Ambulatory Visit: Payer: Self-pay | Admitting: Emergency Medicine

## 2014-12-21 ENCOUNTER — Ambulatory Visit (HOSPITAL_COMMUNITY)
Admission: RE | Admit: 2014-12-21 | Discharge: 2014-12-21 | Disposition: A | Discharge status: Home / Self Care | Payer: Medicare Other | Source: Ambulatory Visit | Attending: Emergency Medicine | Admitting: Emergency Medicine

## 2014-12-21 ENCOUNTER — Ambulatory Visit (INDEPENDENT_AMBULATORY_CARE_PROVIDER_SITE_OTHER): Payer: Medicare Other

## 2014-12-21 ENCOUNTER — Ambulatory Visit (HOSPITAL_COMMUNITY): Payer: No Typology Code available for payment source

## 2014-12-21 ENCOUNTER — Other Ambulatory Visit: Payer: Self-pay

## 2014-12-21 VITALS — BP 140/86 | HR 76 | Temp 98.4°F | Resp 18

## 2014-12-21 DIAGNOSIS — R0602 Shortness of breath: Secondary | ICD-10-CM | POA: Diagnosis not present

## 2014-12-21 DIAGNOSIS — R079 Chest pain, unspecified: Secondary | ICD-10-CM

## 2014-12-21 DIAGNOSIS — R0600 Dyspnea, unspecified: Secondary | ICD-10-CM | POA: Insufficient documentation

## 2014-12-21 DIAGNOSIS — I517 Cardiomegaly: Secondary | ICD-10-CM | POA: Diagnosis not present

## 2014-12-21 DIAGNOSIS — J9811 Atelectasis: Secondary | ICD-10-CM | POA: Diagnosis not present

## 2014-12-21 DIAGNOSIS — M25512 Pain in left shoulder: Secondary | ICD-10-CM

## 2014-12-21 LAB — POCT I-STAT, CHEM 8
BUN: 26 mg/dL — ABNORMAL HIGH (ref 6–23)
Calcium, Ion: 1.14 mmol/L (ref 1.13–1.30)
Chloride: 100 mmol/L (ref 96–112)
Creatinine, Ser: 1 mg/dL (ref 0.50–1.10)
Glucose, Bld: 97 mg/dL (ref 70–99)
HCT: 46 % (ref 36.0–46.0)
Hemoglobin: 15.6 g/dL — ABNORMAL HIGH (ref 12.0–15.0)
Potassium: 4.1 mmol/L (ref 3.5–5.1)
Sodium: 139 mmol/L (ref 135–145)
TCO2: 27 mmol/L (ref 0–100)

## 2014-12-21 LAB — POCT CBC
Granulocyte percent: 70.5 %G (ref 37–80)
HCT, POC: 43.4 % (ref 37.7–47.9)
Hemoglobin: 14.2 g/dL (ref 12.2–16.2)
Lymph, poc: 1.7 (ref 0.6–3.4)
MCH, POC: 31.7 pg — AB (ref 27–31.2)
MCHC: 32.7 g/dL (ref 31.8–35.4)
MCV: 96.9 fL (ref 80–97)
MID (cbc): 0.6 (ref 0–0.9)
MPV: 7 fL (ref 0–99.8)
POC Granulocyte: 5.3 (ref 2–6.9)
POC LYMPH PERCENT: 22.1 %L (ref 10–50)
POC MID %: 7.4 %M (ref 0–12)
Platelet Count, POC: 312 10*3/uL (ref 142–424)
RBC: 4.48 M/uL (ref 4.04–5.48)
RDW, POC: 12.7 %
WBC: 7.5 10*3/uL (ref 4.6–10.2)

## 2014-12-21 LAB — D-DIMER, QUANTITATIVE: D-Dimer, Quant: 0.79 ug/mL-FEU — ABNORMAL HIGH (ref 0.00–0.48)

## 2014-12-21 MED ORDER — ALPRAZOLAM 0.5 MG PO TABS: ORAL_TABLET | ORAL | Status: DC

## 2014-12-21 MED ORDER — IOHEXOL 350 MG/ML SOLN
100.0000 mL | Freq: Once | INTRAVENOUS | Status: AC | PRN
  Administered 2014-12-21: 100 mL via INTRAVENOUS

## 2014-12-21 NOTE — Progress Notes (Addendum)
Subjective:    Patient ID: Dawn Powers, female    DOB: [birth year], 69 y.o.   MRN: [id number] This chart was scribed for [name], MD by Zola Button, Medical Scribe. This patient was seen in room 6 and the patient's care was started at 12:16 PM.   HPI HPI Comments: Dawn Powers is a 69 y.o. female who presents to the [facility] complaining of gradual onset, sharp chest pain that started 2 days ago. Patient believes the pain may be due to musculoskeletal causes. She notes that she went to a yoga class 3 days ago as she does regularly and had done a lot of planks. The following night, she began experiencing SOB due to pain with breathing. Patient also reports having some left shoulder blade pain that has been going on for a while. Her chest pain was initially worsened when pressing on it, but not currently. She reports difficulty using her left arm due to the pain. Patient notes that she went to [facility] for physical therapy about 4-5 days ago; he performed a maneuver on her chest that she described as a "jiggle thing," but she did not have pain due to this. She also had a fall about 5-6 nights ago due to her legs giving out after taking Xanax; she hit her right knee and the left side of her body, but she did not have chest pain at this time.     Review of Systems  Respiratory: Positive for shortness of breath.   Cardiovascular: Positive for chest pain.       Objective:   Physical Exam CONSTITUTIONAL: Well developed/well nourished HEAD: Normocephalic/atraumatic EYES: EOM/PERRL ENMT: Mucous membranes moist NECK: supple no meningeal signs SPINE: entire spine nontender CV: S1/S2 noted, no murmurs/rubs/gallops noted. Cardiac exam normal. LUNGS: Lungs are clear to auscultation bilaterally, no apparent distress ABDOMEN: soft, nontender, no rebound or guarding GU: no cva tenderness NEURO: Pt is awake/alert, moves all extremitiesx4 EXTREMITIES: pulses normal, full  ROM SKIN: warm, color normal MUSCULOSKELETAL: There is tenderness over left pectoralis and anterior chest wall. There is pain with elevation of the left arm. PSYCH: no abnormalities of mood noted  [facility] reading (PRIMARY) by  Dr. Everlene Farrier  no fracture seen there is severe thoracolumbar scoliosis  Results for orders placed or performed in visit on 12/21/14  POCT CBC  Result Value Ref Range   WBC 7.5 4.6 - 10.2 K/uL   Lymph, poc 1.7 0.6 - 3.4   POC LYMPH PERCENT 22.1 10 - 50 %L   MID (cbc) 0.6 0 - 0.9   POC MID % 7.4 0 - 12 %M   POC Granulocyte 5.3 2 - 6.9   Granulocyte percent 70.5 37 - 80 %G   RBC 4.48 4.04 - 5.48 M/uL   Hemoglobin 14.2 12.2 - 16.2 g/dL   HCT, POC 43.4 37.7 - 47.9 %   MCV 96.9 80 - 97 fL   MCH, POC 31.7 (A) 27 - 31.2 pg   MCHC 32.7 31.8 - 35.4 g/dL   RDW, POC 12.7 %   Platelet Count, POC 312 142 - 424 K/uL   MPV 7.0 0 - 99.8 fL        Assessment & Plan:  Patient placed on Xanax to take half tablet at night. She will try an alternate ice and heat to her chest wall. I did perform a d-dimer I will call her with those results. Her EKG chest x-ray rib films and CBC  are unremarkable.I personally performed the services described in this documentation, which was scribed in my presence. The recorded information has been reviewed and is accurate.

## 2014-12-21 NOTE — Telephone Encounter (Signed)
Call Report came in regarding pt CT results.  Results are in Epic

## 2014-12-21 NOTE — Patient Instructions (Signed)

## 2014-12-22 ENCOUNTER — Other Ambulatory Visit: Payer: Self-pay | Admitting: Emergency Medicine

## 2014-12-22 DIAGNOSIS — I517 Cardiomegaly: Secondary | ICD-10-CM

## 2014-12-22 NOTE — Telephone Encounter (Signed)
I have notified patient of the results. She is being scheduled for a cardiac evaluation regarding her left ventricular hypertrophy seen on CT

## 2014-12-28 ENCOUNTER — Telehealth: Payer: Self-pay

## 2014-12-28 NOTE — Telephone Encounter (Signed)
Pt wants Dr. Everlene Farrier to give her a CB. Please advise at [phone]

## 2014-12-28 NOTE — Telephone Encounter (Signed)
Called pt.

## 2015-01-02 DIAGNOSIS — M62830 Muscle spasm of back: Secondary | ICD-10-CM | POA: Diagnosis not present

## 2015-01-02 DIAGNOSIS — M546 Pain in thoracic spine: Secondary | ICD-10-CM | POA: Diagnosis not present

## 2015-01-02 DIAGNOSIS — M5414 Radiculopathy, thoracic region: Secondary | ICD-10-CM | POA: Diagnosis not present

## 2015-01-02 DIAGNOSIS — M9902 Segmental and somatic dysfunction of thoracic region: Secondary | ICD-10-CM | POA: Diagnosis not present

## 2015-01-03 ENCOUNTER — Telehealth: Payer: Self-pay

## 2015-01-03 DIAGNOSIS — G8929 Other chronic pain: Secondary | ICD-10-CM | POA: Diagnosis not present

## 2015-01-03 DIAGNOSIS — M549 Dorsalgia, unspecified: Secondary | ICD-10-CM | POA: Diagnosis not present

## 2015-01-03 NOTE — Telephone Encounter (Signed)
Peggy called and requested x-ray of patient be faxed to [phone]  No phone number or Dr.'s office was given.

## 2015-01-04 DIAGNOSIS — M791 Myalgia: Secondary | ICD-10-CM | POA: Diagnosis not present

## 2015-01-06 ENCOUNTER — Encounter: Payer: Self-pay | Admitting: Emergency Medicine

## 2015-01-09 ENCOUNTER — Other Ambulatory Visit: Payer: Self-pay | Admitting: Emergency Medicine

## 2015-01-09 DIAGNOSIS — M546 Pain in thoracic spine: Secondary | ICD-10-CM

## 2015-01-09 DIAGNOSIS — M542 Cervicalgia: Secondary | ICD-10-CM

## 2015-01-09 DIAGNOSIS — M4120 Other idiopathic scoliosis, site unspecified: Secondary | ICD-10-CM

## 2015-01-09 MED ORDER — HYDROCODONE-ACETAMINOPHEN 5-325 MG PO TABS: 1.0000 | ORAL_TABLET | Freq: Four times a day (QID) | ORAL | Status: DC | PRN

## 2015-01-26 DIAGNOSIS — M5414 Radiculopathy, thoracic region: Secondary | ICD-10-CM | POA: Diagnosis not present

## 2015-01-26 DIAGNOSIS — M546 Pain in thoracic spine: Secondary | ICD-10-CM | POA: Diagnosis not present

## 2015-01-26 DIAGNOSIS — M9902 Segmental and somatic dysfunction of thoracic region: Secondary | ICD-10-CM | POA: Diagnosis not present

## 2015-01-26 DIAGNOSIS — M62830 Muscle spasm of back: Secondary | ICD-10-CM | POA: Diagnosis not present

## 2015-02-01 DIAGNOSIS — R0602 Shortness of breath: Secondary | ICD-10-CM | POA: Diagnosis not present

## 2015-02-01 DIAGNOSIS — I517 Cardiomegaly: Secondary | ICD-10-CM | POA: Diagnosis not present

## 2015-02-13 ENCOUNTER — Encounter: Payer: Self-pay | Admitting: Emergency Medicine

## 2015-03-21 DIAGNOSIS — G8929 Other chronic pain: Secondary | ICD-10-CM | POA: Diagnosis not present

## 2015-03-21 DIAGNOSIS — M5136 Other intervertebral disc degeneration, lumbar region: Secondary | ICD-10-CM | POA: Diagnosis not present

## 2015-03-21 DIAGNOSIS — M5414 Radiculopathy, thoracic region: Secondary | ICD-10-CM | POA: Diagnosis not present

## 2015-03-21 DIAGNOSIS — M549 Dorsalgia, unspecified: Secondary | ICD-10-CM | POA: Diagnosis not present

## 2015-03-28 ENCOUNTER — Encounter: Payer: Self-pay | Admitting: Emergency Medicine

## 2015-03-28 ENCOUNTER — Other Ambulatory Visit: Payer: Self-pay | Admitting: Physical Medicine and Rehabilitation

## 2015-03-28 DIAGNOSIS — M5414 Radiculopathy, thoracic region: Secondary | ICD-10-CM

## 2015-03-28 DIAGNOSIS — M545 Low back pain: Secondary | ICD-10-CM

## 2015-04-04 ENCOUNTER — Other Ambulatory Visit: Payer: Self-pay

## 2015-04-04 DIAGNOSIS — Z1231 Encounter for screening mammogram for malignant neoplasm of breast: Secondary | ICD-10-CM

## 2015-04-12 ENCOUNTER — Ambulatory Visit
Admission: RE | Admit: 2015-04-12 | Discharge: 2015-04-12 | Disposition: A | Discharge status: Home / Self Care | Payer: Medicare Other | Source: Ambulatory Visit | Attending: Physical Medicine and Rehabilitation | Admitting: Physical Medicine and Rehabilitation

## 2015-04-12 DIAGNOSIS — M47814 Spondylosis without myelopathy or radiculopathy, thoracic region: Secondary | ICD-10-CM | POA: Diagnosis not present

## 2015-04-12 DIAGNOSIS — M545 Low back pain: Secondary | ICD-10-CM

## 2015-04-12 DIAGNOSIS — M5124 Other intervertebral disc displacement, thoracic region: Secondary | ICD-10-CM | POA: Diagnosis not present

## 2015-04-12 DIAGNOSIS — M5127 Other intervertebral disc displacement, lumbosacral region: Secondary | ICD-10-CM | POA: Diagnosis not present

## 2015-04-12 DIAGNOSIS — M5414 Radiculopathy, thoracic region: Secondary | ICD-10-CM

## 2015-04-12 DIAGNOSIS — M47817 Spondylosis without myelopathy or radiculopathy, lumbosacral region: Secondary | ICD-10-CM | POA: Diagnosis not present

## 2015-04-12 DIAGNOSIS — M4186 Other forms of scoliosis, lumbar region: Secondary | ICD-10-CM | POA: Diagnosis not present

## 2015-04-13 ENCOUNTER — Telehealth: Payer: Self-pay

## 2015-04-13 NOTE — Telephone Encounter (Signed)
Patient calling to see if her MRI results have been reviewed. Please call back at [phone] after they have been reviewed.

## 2015-04-14 ENCOUNTER — Encounter: Payer: Self-pay | Admitting: Emergency Medicine

## 2015-04-14 NOTE — Telephone Encounter (Addendum)
Dr. Everlene Farrier,  I spoke to pt and told her since we did not order the MRI for her that we would not be able to release results to her. Is that correct? I told her she needs to go through Dr. Christena Flake office for the results. She said it is a lot of work to go through their office to get results since they are out of [place].  She really wants to get the results from you. Pt provided a number for Dr. Christena Flake office. [phone]  Please advise.

## 2015-04-15 NOTE — Telephone Encounter (Signed)
We are not allowed to give results on testing ordered by another provider. If she has not heard from Dr.Karvelasis by Monday please call me and I will call him directly and ask Dr. Thereasa Parkin is to call her

## 2015-04-17 NOTE — Telephone Encounter (Signed)
Sent pt a mychart message. 

## 2015-04-18 ENCOUNTER — Encounter: Payer: Self-pay | Admitting: Emergency Medicine

## 2015-05-02 ENCOUNTER — Other Ambulatory Visit: Payer: Self-pay | Admitting: Emergency Medicine

## 2015-05-02 ENCOUNTER — Encounter: Payer: Self-pay | Admitting: Emergency Medicine

## 2015-05-02 DIAGNOSIS — M546 Pain in thoracic spine: Secondary | ICD-10-CM

## 2015-05-02 DIAGNOSIS — M4120 Other idiopathic scoliosis, site unspecified: Secondary | ICD-10-CM

## 2015-05-02 DIAGNOSIS — M542 Cervicalgia: Secondary | ICD-10-CM

## 2015-05-02 MED ORDER — HYDROCODONE-ACETAMINOPHEN 5-325 MG PO TABS: 1.0000 | ORAL_TABLET | Freq: Four times a day (QID) | ORAL | Status: DC | PRN

## 2015-05-08 ENCOUNTER — Ambulatory Visit
Admission: RE | Admit: 2015-05-08 | Discharge: 2015-05-08 | Disposition: A | Discharge status: Home / Self Care | Payer: Medicare Other | Source: Ambulatory Visit

## 2015-05-08 DIAGNOSIS — Z1231 Encounter for screening mammogram for malignant neoplasm of breast: Secondary | ICD-10-CM | POA: Diagnosis not present

## 2015-05-17 DIAGNOSIS — F419 Anxiety disorder, unspecified: Secondary | ICD-10-CM | POA: Diagnosis not present

## 2015-05-17 DIAGNOSIS — M8580 Other specified disorders of bone density and structure, unspecified site: Secondary | ICD-10-CM | POA: Diagnosis not present

## 2015-05-17 DIAGNOSIS — M47892 Other spondylosis, cervical region: Secondary | ICD-10-CM | POA: Diagnosis not present

## 2015-05-17 DIAGNOSIS — M47894 Other spondylosis, thoracic region: Secondary | ICD-10-CM | POA: Diagnosis not present

## 2015-05-17 DIAGNOSIS — G894 Chronic pain syndrome: Secondary | ICD-10-CM | POA: Diagnosis not present

## 2015-05-17 DIAGNOSIS — M47896 Other spondylosis, lumbar region: Secondary | ICD-10-CM | POA: Diagnosis not present

## 2015-05-17 DIAGNOSIS — G8929 Other chronic pain: Secondary | ICD-10-CM | POA: Diagnosis not present

## 2015-05-17 DIAGNOSIS — M5136 Other intervertebral disc degeneration, lumbar region: Secondary | ICD-10-CM | POA: Diagnosis not present

## 2015-05-17 DIAGNOSIS — M549 Dorsalgia, unspecified: Secondary | ICD-10-CM | POA: Diagnosis not present

## 2015-05-22 ENCOUNTER — Telehealth: Payer: Self-pay

## 2015-05-22 NOTE — Telephone Encounter (Signed)
Patient is requesting test for magnessum b/f her appt tomorrow morning with Dr. Everlene Farrier.   [phone] (H)

## 2015-05-23 ENCOUNTER — Ambulatory Visit (INDEPENDENT_AMBULATORY_CARE_PROVIDER_SITE_OTHER): Payer: Medicare Other | Admitting: Emergency Medicine

## 2015-05-23 ENCOUNTER — Encounter: Payer: Self-pay | Admitting: Internal Medicine

## 2015-05-23 ENCOUNTER — Encounter: Payer: Self-pay | Admitting: Emergency Medicine

## 2015-05-23 VITALS — BP 169/66 | HR 57 | Temp 98.4°F | Resp 16 | Ht 63.5 in | Wt 131.0 lb

## 2015-05-23 DIAGNOSIS — M546 Pain in thoracic spine: Secondary | ICD-10-CM | POA: Diagnosis not present

## 2015-05-23 DIAGNOSIS — E785 Hyperlipidemia, unspecified: Secondary | ICD-10-CM | POA: Diagnosis not present

## 2015-05-23 DIAGNOSIS — Z1211 Encounter for screening for malignant neoplasm of colon: Secondary | ICD-10-CM

## 2015-05-23 DIAGNOSIS — Z0000 Encounter for general adult medical examination without abnormal findings: Secondary | ICD-10-CM | POA: Diagnosis not present

## 2015-05-23 DIAGNOSIS — I10 Essential (primary) hypertension: Secondary | ICD-10-CM

## 2015-05-23 DIAGNOSIS — Z23 Encounter for immunization: Secondary | ICD-10-CM | POA: Diagnosis not present

## 2015-05-23 DIAGNOSIS — M4120 Other idiopathic scoliosis, site unspecified: Secondary | ICD-10-CM

## 2015-05-23 DIAGNOSIS — D72819 Decreased white blood cell count, unspecified: Secondary | ICD-10-CM

## 2015-05-23 DIAGNOSIS — M5134 Other intervertebral disc degeneration, thoracic region: Secondary | ICD-10-CM | POA: Diagnosis not present

## 2015-05-23 DIAGNOSIS — G894 Chronic pain syndrome: Secondary | ICD-10-CM

## 2015-05-23 LAB — COMPLETE METABOLIC PANEL WITH GFR
ALT: 15 U/L (ref 6–29)
AST: 20 U/L (ref 10–35)
Albumin: 4.5 g/dL (ref 3.6–5.1)
Alkaline Phosphatase: 48 U/L (ref 33–130)
BUN: 16 mg/dL (ref 7–25)
CO2: 29 mmol/L (ref 20–31)
Calcium: 9.7 mg/dL (ref 8.6–10.4)
Chloride: 103 mmol/L (ref 98–110)
Creat: 0.88 mg/dL (ref 0.60–0.93)
GFR, Est African American: 76 mL/min (ref >=60)
GFR, Est Non African American: 66 mL/min (ref >=60)
Glucose, Bld: 79 mg/dL (ref 65–99)
Potassium: 4.9 mmol/L (ref 3.5–5.3)
Sodium: 139 mmol/L (ref 135–146)
Total Bilirubin: 0.8 mg/dL (ref 0.2–1.2)
Total Protein: 6.6 g/dL (ref 6.1–8.1)

## 2015-05-23 LAB — LIPID PANEL
Cholesterol: 210 mg/dL — ABNORMAL HIGH (ref 125–200)
HDL: 88 mg/dL (ref >=46)
LDL Cholesterol: 105 mg/dL (ref <130)
Total CHOL/HDL Ratio: 2.4 Ratio (ref <=5.0)
Triglycerides: 84 mg/dL (ref <150)
VLDL: 17 mg/dL (ref <30)

## 2015-05-23 LAB — POCT URINALYSIS DIPSTICK
Bilirubin, UA: NEGATIVE
Blood, UA: NEGATIVE
Glucose, UA: NEGATIVE
Ketones, UA: NEGATIVE
Leukocytes, UA: NEGATIVE
Nitrite, UA: NEGATIVE
Protein, UA: NEGATIVE
Spec Grav, UA: 1.02
Urobilinogen, UA: 0.2
pH, UA: 6

## 2015-05-23 LAB — CBC WITH DIFFERENTIAL/PLATELET
Basophils Absolute: 0 10*3/uL (ref 0.0–0.1)
Basophils Relative: 1 % (ref 0–1)
Eosinophils Absolute: 0.1 10*3/uL (ref 0.0–0.7)
Eosinophils Relative: 3 % (ref 0–5)
HCT: 41.4 % (ref 36.0–46.0)
Hemoglobin: 13.8 g/dL (ref 12.0–15.0)
Lymphocytes Relative: 50 % — ABNORMAL HIGH (ref 12–46)
Lymphs Abs: 1.8 10*3/uL (ref 0.7–4.0)
MCH: 31.8 pg (ref 26.0–34.0)
MCHC: 33.3 g/dL (ref 30.0–36.0)
MCV: 95.4 fL (ref 78.0–100.0)
MPV: 10.3 fL (ref 8.6–12.4)
Monocytes Absolute: 0.4 10*3/uL (ref 0.1–1.0)
Monocytes Relative: 10 % (ref 3–12)
Neutro Abs: 1.3 10*3/uL — ABNORMAL LOW (ref 1.7–7.7)
Neutrophils Relative %: 36 % — ABNORMAL LOW (ref 43–77)
Platelets: 225 10*3/uL (ref 150–400)
RBC: 4.34 MIL/uL (ref 3.87–5.11)
RDW: 13.3 % (ref 11.5–15.5)
WBC: 3.6 10*3/uL — ABNORMAL LOW (ref 4.0–10.5)

## 2015-05-23 LAB — CK: Total CK: 91 U/L (ref 7–177)

## 2015-05-23 LAB — POCT UA - MICROSCOPIC ONLY
Casts, Ur, LPF, POC: NEGATIVE
Crystals, Ur, HPF, POC: NEGATIVE
Epithelial cells, urine per micros: NEGATIVE
Mucus, UA: NEGATIVE
WBC, Ur, HPF, POC: NEGATIVE
Yeast, UA: NEGATIVE

## 2015-05-23 LAB — MAGNESIUM: Magnesium: 2.1 mg/dL (ref 1.5–2.5)

## 2015-05-23 MED ORDER — LOSARTAN POTASSIUM 25 MG PO TABS: 25.0000 mg | ORAL_TABLET | Freq: Every day | ORAL | Status: DC

## 2015-05-23 NOTE — Patient Instructions (Signed)
Please make an appointment to see your ophthalmologist. Please check your blood pressure every day. I have scheduled you for a study of your kidneys to be sure you have good vascular supply. Take your blood pressure medicine every day.

## 2015-05-23 NOTE — Telephone Encounter (Signed)
This can be ordered during her visit.

## 2015-05-23 NOTE — Telephone Encounter (Signed)
Pt got done during visit today

## 2015-05-23 NOTE — Progress Notes (Signed)
This chart was scribed for Lesle Chris, MD by Broadus John, Medical Scribe. This patient was seen in Room 29 and the patient's care was started at 9:52 AM.   Chief Complaint:  Chief Complaint  Patient presents with  . Annual Exam    HPI: Dawn Powers is a 69 y.o. female with history of scoliosis, and chronic pain syndrome who reports to [facility] today for a complete physical exam.  Pt reports that she had a sever episode of back pain yesterday. The pain has resolved today. Pt notes that she does tai chi and regular walking for exercise. Pt states that she did not find relief with Lyrica, which she reports experiencing side effects of weight gain, dizziness, and dry mouth; she plans to stop taking it. She also states that she did not find relief with acupuncture. Pt notes that she was pleased with her visit with Dr. Lyla Son, PhD neuropsychologist. She indicates that she found relief with the mediation sessions that she has.  Pt also reports that her muscle spasms do prevent her from going about her daily whereabouts such as driving.   Pt reports having numbness in the bottom of her feet and her right thump. She also indicates having a sharp shooting pain on the same thump on reflexes. She denies symptoms of weakness.   Pt shows concerns at being prescribed a blood pressure medications due to their side effects.   Pt reports that she is due for a colonoscopy this year. She notes that she needs to see an optometrist for vision problems. Pt is UTD with her mammogram.   Pt notes that she did follow up with Dr. Jacinto Halim, and was pleased with his service.   Pt has 2 grandchildren (19 and 110 years of age).     Past Medical History  Diagnosis Date  . Hyperlipidemia   . Arthritis   . Heart murmur    Past Surgical History  Procedure Laterality Date  . Fracture surgery      ankle   Social History   Social History  . Marital Status: Married    Spouse Name: N/A  . Number of  Children: N/A  . Years of Education: N/A   Social History Main Topics  . Smoking status: Former Smoker -- 4 years    Types: Cigarettes    Quit date: 09/30/2001  . Smokeless tobacco: Never Used  . Alcohol Use: 2.0 - 2.5 oz/week    4-5 drink(s) per week     Comment: WINE AND BOURBON - daily  . Drug Use: No  . Sexual Activity:    Partners: Female    Copy: None   Other Topics Concern  . None   Social History Narrative   Married. Education: college and Other. Exercise 4 times a week for 50 minutes.   Family History  Problem Relation Age of Onset  . Cancer Mother   . Heart disease Mother   . Alcohol abuse Mother   . Dementia Mother   . Cancer Father   . Alcohol abuse Father   . Diabetes Brother   . Alcohol abuse Maternal Grandfather   . Alcohol abuse Sister    Allergies  Allergen Reactions  . Morphine And Related Hives   Prior to Admission medications   Medication Sig Start Date End Date Taking? Authorizing Provider  ALPRAZolam Prudy Feeler) 0.5 MG tablet Take one half tablet at bedtime as needed for sleep 12/21/14   Collene Gobble,  MD  gabapentin (NEURONTIN) 300 MG capsule Take one tablet in the morning one tablet early afternoon and one tablet at bed time Patient not taking: Reported on 12/21/2014 07/05/14   Charlett Blake, MD  HYDROcodone-acetaminophen (NORCO) 5-325 MG per tablet Take 1 tablet by mouth every 6 (six) hours as needed for moderate pain. Take 1-1/2 tablets twice a day 05/02/15   Darlyne Russian, MD  magnesium 30 MG tablet Take 30 mg by mouth 2 (two) times daily.    Historical Provider, MD  Melatonin 3 MG CAPS Take by mouth.    Historical Provider, MD  multivitamin-lutein (OCUVITE-LUTEIN) CAPS capsule Take 1 capsule by mouth daily.    Historical Provider, MD  Propylene Glycol (SYSTANE BALANCE OP) Apply to eye 3 (three) times daily.    Historical Provider, MD     ROS: The patient has myalgia, numbness.  Pt denies fevers, chills, night sweats,  unintentional weight loss, chest pain, palpitations, wheezing, dyspnea on exertion, nausea, vomiting, abdominal pain, dysuria, hematuria, melena, weakness.   All other systems have been reviewed and were otherwise negative with the exception of those mentioned in the HPI and as above.    PHYSICAL EXAM: Filed Vitals:   05/23/15 0949  BP: 169/66  Pulse: 57  Temp: 98.4 F (36.9 C)  Resp: 16   Body mass index is 22.84 kg/(m^2).   General: Alert, no acute distress HEENT:  Normocephalic, atraumatic, oropharynx patent. Eye: Juliette Mangle [facility] Cardiovascular:  Regular rate and rhythm, no rubs murmurs or gallops.  No Carotid bruits, radial pulse intact. No pedal edema.  Respiratory: Clear to auscultation bilaterally.  No wheezes, rales, or rhonchi.  No cyanosis, no use of accessory musculature Abdominal: No organomegaly, abdomen is soft and non-tender, positive bowel sounds.  No masses. Musculoskeletal: Gait intact. No edema, tenderness Skin: No rashes. Neurologic: Facial musculature symmetric. Psychiatric: Patient acts appropriately throughout our interaction. Lymphatic: No cervical or submandibular lymphadenopathy Genitourinary/Anorectal: No acute findings  Blood pressure at 10:08am: left arm/resting- 170/70.  LABS: Results for orders placed or performed during the hospital encounter of 12/21/14  I-STAT, chem 8  Result Value Ref Range   Sodium 139 135 - 145 mmol/L   Potassium 4.1 3.5 - 5.1 mmol/L   Chloride 100 96 - 112 mmol/L   BUN 26 (H) 6 - 23 mg/dL   Creatinine, Ser 1.00 0.50 - 1.10 mg/dL   Glucose, Bld 97 70 - 99 mg/dL   Calcium, Ion 1.14 1.13 - 1.30 mmol/L   TCO2 27 0 - 100 mmol/L   Hemoglobin 15.6 (H) 12.0 - 15.0 g/dL   HCT 46.0 36.0 - 46.0 %     EKG/XRAY:   Primary read interpreted by Dr. Everlene Farrier at [facility].   ASSESSMENT/PLAN: 1. Annual physical exam  - POC Hemoccult Bld/Stl (3-Cd Home Screen); Future - POCT UA - Microscopic Only - POCT urinalysis dipstick - CBC with  Differential/Platelet - COMPLETE METABOLIC PANEL WITH GFR - Magnesium - Lipid panel - CK  2. Midline thoracic back pain She sees her back specialist from [place]. She is currently also going to therapy. She will continue tai chi. There could and acupuncture were not helpful. She continues to take hydrocodone on a when necessary basis  3. Scoliosis (and kyphoscoliosis), idiopathic As above  4. Degenerative disc disease, thoracic As above  5. Chronic pain syndrome Will monitor her kidney liver function. Have also checked a magnesium level. - POC Hemoccult Bld/Stl (3-Cd Home Screen); Future - POCT UA - Microscopic Only -  POCT urinalysis dipstick - CBC with Differential/Platelet - COMPLETE METABOLIC PANEL WITH GFR - Magnesium - Lipid panel - CK  6. Hyperlipidemia  - Lipid panel  7. Leucopenia  - CBC with Differential/Platelet  8. Need for prophylactic vaccination and inoculation against influenza  - Flu Vaccine QUAD 36+ mos IM  9. Essential hypertension Return to clinic 1 month for blood pressure check. She will check her blood pressure daily at home. - losartan (COZAAR) 25 MG tablet; Take 1 tablet (25 mg total) by mouth daily.  Dispense: 30 tablet; Refill: 11 - VAS US RENAL ARTERY DUPLEX; Future  10. Special screening for malignant neoplasms, colon  - Ambulatory referral to Gastroenterology   I personally performed the services described in this documentation, which was scribed in my presence. The recorded information has been reviewed and is accurate.I personally performed the services described in this documentation, which was scribed in my presence. The recorded information has been reviewed and is accurate.  [name], MD  [name], MD  [facility]  05/23/2015 10:50 AM    Gross sideeffects, risk and benefits, and alternatives of medications d/w patient. Patient is aware that all medications have  potential sideeffects and we are unable to predict every sideeffect or drug-drug interaction that may occur.  [name] MD 05/23/2015 9:56 AM

## 2015-05-26 ENCOUNTER — Ambulatory Visit (HOSPITAL_COMMUNITY)
Admission: RE | Admit: 2015-05-26 | Discharge: 2015-05-26 | Disposition: A | Discharge status: Home / Self Care | Payer: Medicare Other | Source: Ambulatory Visit | Attending: Emergency Medicine | Admitting: Emergency Medicine

## 2015-05-26 DIAGNOSIS — I10 Essential (primary) hypertension: Secondary | ICD-10-CM | POA: Insufficient documentation

## 2015-05-26 NOTE — Progress Notes (Signed)
*  PRELIMINARY RESULTS* Vascular Ultrasound Renal Artery Duplex has been completed.  Preliminary findings: No evidence of renal artery stenosis.   Landry Mellow, RDMS, RVT  05/26/2015, 10:34 AM

## 2015-05-30 ENCOUNTER — Telehealth: Payer: Self-pay

## 2015-05-30 NOTE — Telephone Encounter (Signed)
Referrals did you call pt.

## 2015-05-30 NOTE — Telephone Encounter (Signed)
Patient is returning a missed phone call. She stated to we have to let the phone ring longer so she has a chance to get to the phone. Please call!

## 2015-06-10 ENCOUNTER — Encounter: Payer: Self-pay | Admitting: Emergency Medicine

## 2015-06-11 ENCOUNTER — Telehealth: Payer: Self-pay | Admitting: Emergency Medicine

## 2015-06-11 ENCOUNTER — Other Ambulatory Visit: Payer: Self-pay | Admitting: Emergency Medicine

## 2015-06-11 MED ORDER — TRAZODONE HCL 50 MG PO TABS: 25.0000 mg | ORAL_TABLET | Freq: Every evening | ORAL | Status: DC | PRN

## 2015-06-11 NOTE — Telephone Encounter (Signed)
Patient requesting trazodone to try for sleep. This was called into the pharmacy.

## 2015-06-11 NOTE — Telephone Encounter (Signed)
GFR is the filtration rate of your kidneys it is a mathematical equation which uses your age, race, sex, and Serum creatinine level to determine you filtration rate.  We expect this number to be about 60 or above

## 2015-06-20 ENCOUNTER — Ambulatory Visit: Payer: Medicare Other | Admitting: Emergency Medicine

## 2015-06-27 ENCOUNTER — Ambulatory Visit (INDEPENDENT_AMBULATORY_CARE_PROVIDER_SITE_OTHER): Payer: Medicare Other | Admitting: Emergency Medicine

## 2015-06-27 VITALS — BP 154/84 | HR 64 | Temp 97.6°F | Resp 16 | Ht 64.0 in | Wt 128.0 lb

## 2015-06-27 DIAGNOSIS — M4120 Other idiopathic scoliosis, site unspecified: Secondary | ICD-10-CM

## 2015-06-27 DIAGNOSIS — M546 Pain in thoracic spine: Secondary | ICD-10-CM

## 2015-06-27 DIAGNOSIS — M542 Cervicalgia: Secondary | ICD-10-CM

## 2015-06-27 DIAGNOSIS — D72819 Decreased white blood cell count, unspecified: Secondary | ICD-10-CM

## 2015-06-27 MED ORDER — HYDROCODONE-ACETAMINOPHEN 5-325 MG PO TABS: 1.0000 | ORAL_TABLET | Freq: Four times a day (QID) | ORAL | Status: DC | PRN

## 2015-06-27 NOTE — Progress Notes (Signed)
Patient ID: Dawn Powers, female   DOB: 1946-02-22, 69 y.o.   MRN: [id number]     This chart was scribed for Dawn Queen, MD by Zola Button, Medical Scribe. This patient was seen in room 21 and the patient's care was started at 11:21 AM.   Chief Complaint:  Chief Complaint  Patient presents with  . Hyperlipidemia  . Hypertension    HPI: Dawn Powers is a 69 y.o. female with a history of chronic pain syndrome, scoliosis, hyperlipidemia and hypertension who reports to [facility] today for a follow-up. Patient has been taking her blood pressure at home which have been relatively normal. She agrees to bring in her blood pressure cuff at another time to verify its accuracy. She had the US renal artery duplex last month which was normal.  She has a colonoscopy scheduled in November with [facility].  Patient still has intermittent episodes of back pain. When she has these episodes, she rolls around on the ground.  Some of her family members have been dealing with lice, for which they hired a Water quality scientist. She did self-treat for lice with dimethicone, but she still has some concerns about lice and would like her scalp examined.  Patient plays the flute and 4 versions of the recorder.  Past Medical History  Diagnosis Date  . Hyperlipidemia   . Arthritis   . Heart murmur    Past Surgical History  Procedure Laterality Date  . Fracture surgery      ankle   Social History   Social History  . Marital Status: Married    Spouse Name: N/A  . Number of Children: N/A  . Years of Education: N/A   Social History Main Topics  . Smoking status: Former Smoker -- 4 years    Types: Cigarettes    Quit date: 09/30/2001  . Smokeless tobacco: Never Used  . Alcohol Use: 2.0 - 2.5 oz/week    4-5 drink(s) per week     Comment: WINE AND BOURBON - daily  . Drug Use: No  . Sexual Activity:    Partners: Female    Museum/gallery curator: None   Other Topics Concern  . Not on file   Social  History Narrative   Married. Education: college and Other. Exercise 4 times a week for 50 minutes.   Family History  Problem Relation Age of Onset  . Cancer Mother   . Heart disease Mother   . Alcohol abuse Mother   . Dementia Mother   . Cancer Father   . Alcohol abuse Father   . Diabetes Brother   . Alcohol abuse Maternal Grandfather   . Alcohol abuse Sister    Allergies  Allergen Reactions  . Morphine And Related Hives   Prior to Admission medications   Medication Sig Start Date End Date Taking? Authorizing Provider  HYDROcodone-acetaminophen (NORCO) 5-325 MG per tablet Take 1 tablet by mouth every 6 (six) hours as needed for moderate pain. Take 1-1/2 tablets twice a day 05/02/15  Yes Darlyne Russian, MD  Propylene Glycol (SYSTANE BALANCE OP) Apply to eye 3 (three) times daily.   Yes Historical Provider, MD  traZODone (DESYREL) 50 MG tablet Take 0.5-1 tablets (25-50 mg total) by mouth at bedtime as needed for sleep. 06/11/15  Yes Darlyne Russian, MD  losartan (COZAAR) 25 MG tablet Take 1 tablet (25 mg total) by mouth daily. Patient not taking: Reported on 06/27/2015 05/23/15   Darlyne Russian, MD  ROS: The patient denies fevers, chills, night sweats, unintentional weight loss, chest pain, palpitations, wheezing, dyspnea on exertion, nausea, vomiting, abdominal pain, dysuria, hematuria, melena, numbness, weakness, or tingling.   All other systems have been reviewed and were otherwise negative with the exception of those mentioned in the HPI and as above.    PHYSICAL EXAM: Filed Vitals:   06/27/15 1117  BP: 154/84  Pulse: 64  Temp:   Resp:    Body mass index is 21.96 kg/(m^2).   General: Alert, no acute distress HEENT:  Normocephalic, atraumatic, oropharynx patent. Eye: Juliette Mangle [facility] Cardiovascular:  Regular rate and rhythm, no rubs murmurs or gallops.  No Carotid bruits, radial pulse intact. No pedal edema. Repeat blood pressure: 170/80. Respiratory: Clear to auscultation  bilaterally.  No wheezes, rales, or rhonchi.  No cyanosis, no use of accessory musculature Abdominal: No organomegaly, abdomen is soft and non-tender, positive bowel sounds.  No masses. Musculoskeletal: Gait intact. No edema, tenderness Skin: No rashes. Neurologic: Facial musculature symmetric. Psychiatric: Patient acts appropriately throughout our interaction. Lymphatic: No cervical or submandibular lymphadenopathy    LABS:    EKG/XRAY:   Primary read interpreted by Dr. Everlene Farrier at [facility].   ASSESSMENT/PLAN: The trazodone may be helping her sleep at night. Her pain medications were refilled. She has been taking this much less. No change in her medical treatment program for her back. Her white count was low last time and this was repeated along with a path review and HIV test.  By signing my name below, I, Zola Button, attest that this documentation has been prepared under the direction and in the presence of Dawn Queen, MD.  Electronically Signed: Zola Button, Medical Scribe. 06/27/2015. 11:40 AM.  Johney Maine sideeffects, risk and benefits, and alternatives of medications d/w patient. Patient is aware that all medications have potential sideeffects and we are unable to predict every sideeffect or drug-drug interaction that may occur.  Dawn Queen MD 06/27/2015 11:40 AM

## 2015-06-27 NOTE — Progress Notes (Deleted)
   Subjective:    Patient ID: Dawn Powers, female    DOB: [birth year], 69 y.o.   MRN: [id number]  HPI    Review of Systems     Objective:   Physical Exam        Assessment & Plan:

## 2015-06-28 ENCOUNTER — Encounter: Payer: Self-pay | Admitting: Emergency Medicine

## 2015-06-28 LAB — CBC WITH DIFFERENTIAL/PLATELET
Basophils Absolute: 0 10*3/uL (ref 0.0–0.1)
Basophils Relative: 1 % (ref 0–1)
Eosinophils Absolute: 0 10*3/uL (ref 0.0–0.7)
Eosinophils Relative: 1 % (ref 0–5)
HCT: 42 % (ref 36.0–46.0)
Hemoglobin: 14.3 g/dL (ref 12.0–15.0)
Lymphocytes Relative: 36 % (ref 12–46)
Lymphs Abs: 1.4 10*3/uL (ref 0.7–4.0)
MCH: 32.1 pg (ref 26.0–34.0)
MCHC: 34 g/dL (ref 30.0–36.0)
MCV: 94.4 fL (ref 78.0–100.0)
MPV: 9.9 fL (ref 8.6–12.4)
Monocytes Absolute: 0.3 10*3/uL (ref 0.1–1.0)
Monocytes Relative: 7 % (ref 3–12)
Neutro Abs: 2.1 10*3/uL (ref 1.7–7.7)
Neutrophils Relative %: 55 % (ref 43–77)
Platelets: 228 10*3/uL (ref 150–400)
RBC: 4.45 MIL/uL (ref 3.87–5.11)
RDW: 14.1 % (ref 11.5–15.5)
WBC: 3.9 10*3/uL — ABNORMAL LOW (ref 4.0–10.5)

## 2015-06-28 LAB — PATHOLOGIST SMEAR REVIEW

## 2015-06-28 LAB — HIV ANTIBODY (ROUTINE TESTING W REFLEX): HIV 1&2 Ab, 4th Generation: NONREACTIVE

## 2015-07-04 ENCOUNTER — Encounter: Payer: Self-pay | Admitting: Emergency Medicine

## 2015-07-12 DIAGNOSIS — M546 Pain in thoracic spine: Secondary | ICD-10-CM | POA: Diagnosis not present

## 2015-07-12 DIAGNOSIS — G8929 Other chronic pain: Secondary | ICD-10-CM | POA: Diagnosis not present

## 2015-07-13 ENCOUNTER — Ambulatory Visit (AMBULATORY_SURGERY_CENTER): Payer: Self-pay

## 2015-07-13 VITALS — Ht 63.5 in | Wt 130.8 lb

## 2015-07-13 DIAGNOSIS — Z1211 Encounter for screening for malignant neoplasm of colon: Secondary | ICD-10-CM

## 2015-07-13 NOTE — Progress Notes (Signed)
No allergies to eggs or soy No diet/weight loss meds No home oxygen No past problems with anesthesia  Refused emmi 

## 2015-07-24 ENCOUNTER — Telehealth: Payer: Self-pay

## 2015-07-24 ENCOUNTER — Encounter: Payer: Self-pay | Admitting: Internal Medicine

## 2015-07-24 NOTE — Telephone Encounter (Signed)
Yes, OK for note

## 2015-07-24 NOTE — Telephone Encounter (Signed)
Pt requesting fax be sent to her gym [employer] @ [phone] stating she cant go to the gym the months of Nov,Dec due to back inj   Best phone for pt is [phone]

## 2015-07-24 NOTE — Telephone Encounter (Signed)
Can I write a note?

## 2015-07-25 NOTE — Telephone Encounter (Signed)
Note written and faxed 

## 2015-07-25 NOTE — Telephone Encounter (Signed)
Advised pt on voicemail. 

## 2015-07-26 ENCOUNTER — Telehealth: Payer: Self-pay | Admitting: Internal Medicine

## 2015-07-26 DIAGNOSIS — H16253 Phlyctenular keratoconjunctivitis, bilateral: Secondary | ICD-10-CM | POA: Diagnosis not present

## 2015-07-26 DIAGNOSIS — H52213 Irregular astigmatism, bilateral: Secondary | ICD-10-CM | POA: Diagnosis not present

## 2015-07-26 DIAGNOSIS — H2513 Age-related nuclear cataract, bilateral: Secondary | ICD-10-CM | POA: Diagnosis not present

## 2015-07-26 DIAGNOSIS — H18453 Nodular corneal degeneration, bilateral: Secondary | ICD-10-CM | POA: Diagnosis not present

## 2015-07-26 DIAGNOSIS — H16223 Keratoconjunctivitis sicca, not specified as Sjogren's, bilateral: Secondary | ICD-10-CM | POA: Diagnosis not present

## 2015-07-26 DIAGNOSIS — H04123 Dry eye syndrome of bilateral lacrimal glands: Secondary | ICD-10-CM | POA: Diagnosis not present

## 2015-07-26 DIAGNOSIS — H25813 Combined forms of age-related cataract, bilateral: Secondary | ICD-10-CM | POA: Diagnosis not present

## 2015-07-26 NOTE — Telephone Encounter (Signed)
Pt concerned abt prep because her friend had explosive diarrhea while taking prep. Explained how miralax/dulcolax work and discussed other prep options. Pt said she will stick with miralax and dulcolax prep.

## 2015-08-02 ENCOUNTER — Ambulatory Visit (AMBULATORY_SURGERY_CENTER): Payer: Medicare Other | Admitting: Internal Medicine

## 2015-08-02 ENCOUNTER — Encounter: Payer: Self-pay | Admitting: Internal Medicine

## 2015-08-02 VITALS — BP 135/72 | HR 53 | Temp 97.6°F | Resp 16 | Ht 64.0 in | Wt 128.0 lb

## 2015-08-02 DIAGNOSIS — Z1211 Encounter for screening for malignant neoplasm of colon: Secondary | ICD-10-CM | POA: Diagnosis not present

## 2015-08-02 DIAGNOSIS — F329 Major depressive disorder, single episode, unspecified: Secondary | ICD-10-CM | POA: Diagnosis not present

## 2015-08-02 MED ORDER — SODIUM CHLORIDE 0.9 % IV SOLN: 500.0000 mL | INTRAVENOUS | Status: DC

## 2015-08-02 NOTE — Patient Instructions (Addendum)
No polyps again!  I do not recommend future routine repeat colonoscopy for you at this time.  I appreciate the opportunity to care for you. Gatha Mayer, MD, FACG  YOU HAD AN ENDOSCOPIC PROCEDURE TODAY AT [facility]:   Refer to the procedure report that was given to you for any specific questions about what was found during the examination.  If the procedure report does not answer your questions, please call your gastroenterologist to clarify.  If you requested that your care partner not be given the details of your procedure findings, then the procedure report has been included in a sealed envelope for you to review at your convenience later.  YOU SHOULD EXPECT: Some feelings of bloating in the abdomen. Passage of more gas than usual.  Walking can help get rid of the air that was put into your GI tract during the procedure and reduce the bloating. If you had a lower endoscopy (such as a colonoscopy or flexible sigmoidoscopy) you may notice spotting of blood in your stool or on the toilet paper. If you underwent a bowel prep for your procedure, you may not have a normal bowel movement for a few days.  Please Note:  You might notice some irritation and congestion in your nose or some drainage.  This is from the oxygen used during your procedure.  There is no need for concern and it should clear up in a day or so.  SYMPTOMS TO REPORT IMMEDIATELY:   Following lower endoscopy (colonoscopy or flexible sigmoidoscopy):  Excessive amounts of blood in the stool  Significant tenderness or worsening of abdominal pains  Swelling of the abdomen that is new, acute  Fever of 100F or higher   For urgent or emergent issues, a gastroenterologist can be reached at any hour by calling [phone].   DIET: Your first meal following the procedure should be a small meal and then it is ok to progress to your normal diet. Heavy or fried foods are harder to digest and may make you  feel nauseous or bloated.  Likewise, meals heavy in dairy and vegetables can increase bloating.  Drink plenty of fluids but you should avoid alcoholic beverages for 24 hours.  ACTIVITY:  You should plan to take it easy for the rest of today and you should NOT DRIVE or use heavy machinery until tomorrow (because of the sedation medicines used during the test).    FOLLOW UP: Our staff will call the number listed on your records the next business day following your procedure to check on you and address any questions or concerns that you may have regarding the information given to you following your procedure. If we do not reach you, we will leave a message.  However, if you are feeling well and you are not experiencing any problems, there is no need to return our call.  We will assume that you have returned to your regular daily activities without incident.  If any biopsies were taken you will be contacted by phone or by letter within the next 1-3 weeks.  Please call us at [phone] if you have not heard about the biopsies in 3 weeks.    SIGNATURES/CONFIDENTIALITY: You and/or your care partner have signed paperwork which will be entered into your electronic medical record.  These signatures attest to the fact that that the information above on your After Visit Summary has been reviewed and is understood.  Full responsibility of the confidentiality of this  discharge information lies with you and/or your care-partner. 

## 2015-08-02 NOTE — Progress Notes (Signed)
Report to PACU, RN, vss, BBS= Clear.  

## 2015-08-02 NOTE — Op Note (Signed)
[place]  [address] [place], [id number]   COLONOSCOPY PROCEDURE REPORT  PATIENT: Dawn Powers, Dawn Powers  MR#: [id number] BIRTHDATE: [birth year] , 69  yrs. old GENDER: female ENDOSCOPIST: Gatha Mayer, MD, [facility] PROCEDURE DATE:  08/02/2015 PROCEDURE:   Colonoscopy, screening First Screening Colonoscopy - Avg.  risk and is 50 yrs.  old or older - No.  Prior Negative Screening - Now for repeat screening. 10 or more years since last screening  History of Adenoma - Now for follow-up colonoscopy & has been > or = to 3 yrs.  N/A  Polyps removed today? No Recommend repeat exam, <10 yrs? No ASA CLASS:   Class II INDICATIONS:Screening for colonic neoplasia and Colorectal Neoplasm Risk Assessment for this procedure is average risk. MEDICATIONS: Propofol 200 mg IV and Monitored anesthesia care  DESCRIPTION OF PROCEDURE:   After the risks benefits and alternatives of the procedure were thoroughly explained, informed consent was obtained.  The digital rectal exam revealed no abnormalities of the rectum.   The [id number]  endoscope was introduced through the anus and advanced to the cecum, which was identified by both the appendix and ileocecal valve. No adverse events experienced.   The quality of the prep was excellent. (MiraLax was used)  The instrument was then slowly withdrawn as the colon was fully examined. Estimated blood loss is zero unless otherwise noted in this procedure report.      COLON FINDINGS: There was diverticulosis noted in the sigmoid colon. The examination was otherwise normal.  Retroflexed views revealed no abnormalities. The time to cecum = 7.4 Withdrawal time = 10.1 The scope was withdrawn and the procedure completed. COMPLICATIONS: There were no immediate complications.  ENDOSCOPIC IMPRESSION: 1.   Diverticulosis was noted in the sigmoid colon 2.   The examination was otherwise normal - excellent prep  RECOMMENDATIONS: F/U GI prn -  no need for future routine repeat colonoscopy  eSigned:  Gatha Mayer, MD, [facility] 08/02/2015 12:03 PM   cc: The Patient

## 2015-08-03 ENCOUNTER — Telehealth: Payer: Self-pay | Admitting: Emergency Medicine

## 2015-08-03 NOTE — Telephone Encounter (Signed)
Left message, identifier present 

## 2015-08-18 DIAGNOSIS — C541 Malignant neoplasm of endometrium: Secondary | ICD-10-CM | POA: Diagnosis not present

## 2015-08-18 DIAGNOSIS — N950 Postmenopausal bleeding: Secondary | ICD-10-CM | POA: Diagnosis not present

## 2015-08-21 DIAGNOSIS — D1801 Hemangioma of skin and subcutaneous tissue: Secondary | ICD-10-CM | POA: Diagnosis not present

## 2015-08-21 DIAGNOSIS — L821 Other seborrheic keratosis: Secondary | ICD-10-CM | POA: Diagnosis not present

## 2015-08-21 DIAGNOSIS — L812 Freckles: Secondary | ICD-10-CM | POA: Diagnosis not present

## 2015-08-21 DIAGNOSIS — L218 Other seborrheic dermatitis: Secondary | ICD-10-CM | POA: Diagnosis not present

## 2015-08-21 DIAGNOSIS — L570 Actinic keratosis: Secondary | ICD-10-CM | POA: Diagnosis not present

## 2015-08-21 DIAGNOSIS — D225 Melanocytic nevi of trunk: Secondary | ICD-10-CM | POA: Diagnosis not present

## 2015-08-29 ENCOUNTER — Encounter: Payer: Self-pay | Admitting: Emergency Medicine

## 2015-08-29 DIAGNOSIS — C541 Malignant neoplasm of endometrium: Secondary | ICD-10-CM | POA: Diagnosis not present

## 2015-09-01 ENCOUNTER — Encounter: Payer: Self-pay | Admitting: Emergency Medicine

## 2015-09-08 ENCOUNTER — Ambulatory Visit: Payer: Medicare Other | Admitting: Emergency Medicine

## 2015-09-08 ENCOUNTER — Ambulatory Visit: Payer: Medicare Other | Attending: Gynecology | Admitting: Gynecology

## 2015-09-08 ENCOUNTER — Encounter: Payer: Self-pay | Admitting: Gynecology

## 2015-09-08 VITALS — BP 155/64 | HR 82 | Temp 98.2°F | Resp 18 | Ht 64.0 in | Wt 131.9 lb

## 2015-09-08 DIAGNOSIS — C541 Malignant neoplasm of endometrium: Secondary | ICD-10-CM | POA: Insufficient documentation

## 2015-09-08 NOTE — Progress Notes (Signed)
Consult Note: Gyn-Onc   GRAVIELA SHUBIN 69 y.o. female  Chief Complaint  Patient presents with  . endometrial cancer    New consult    Assessment : Grade 2 endometrial adenocarcinoma. The natural history of this disease and treatment were reviewed with the patient and her husband. Recommend she undergo a robotic-assisted hysterectomy and bilateral salpingo-oophorectomy and pelvic and aortic lymphadenectomy. Surgery scheduled for 09/26/2015. The risks of surgery including hemorrhage infection injury to adjacent viscera and thromboembolic complications as well as anesthetic risks were reviewed. Patient understands and Dr. Everitt Amber will be a primary surgeon. All the patient's questions are answered.  HPI: 69 year old white married female gravida 2 seen in consultation request of Dr. Sherren Mocha Meisinger regarding management of a newly diagnosed endometrial adenocarcinoma. The patient has had several weeks of spotting before seeing Dr. Phineas Douglas on 08/18/2015. An endometrial biopsy was obtained revealing a grade 2 endometrial carcinoma. The uterus sounded 7.5 cm.  The patient's had an unremarkable gynecologic history. She has had an endometrial polyp removed previously.  She denies any family history of gynecologic or breast cancer. Her father had prostate cancer.  Review of Systems:10 point review of systems is negative except as noted in interval history.   Vitals: Blood pressure 155/64, pulse 82, temperature 98.2 F (36.8 C), temperature source Oral, resp. rate 18, height 5\' 4"  (1.626 m), weight 131 lb 14.4 oz (59.829 kg), SpO2 100 %.  Physical Exam: General : The patient is a healthy woman in no acute distress.  HEENT: normocephalic, extraoccular movements normal; neck is supple without thyromegally  Lynphnodes: Supraclavicular and inguinal nodes not enlarged  Abdomen: Soft, non-tender, no ascites, no organomegally, no masses, no hernias  Pelvic:  EGBUS: Normal female  Vagina: Normal, no  lesions  Urethra and Bladder: Normal, non-tender  Cervix: Normal menopausal Uterus: Anterior normal shape size and consistency Bi-manual examination: Non-tender; no adenxal masses or nodularity  Rectal: normal sphincter tone, no masses, no blood  Lower extremities: No edema or varicosities. Normal range of motion      Allergies  Allergen Reactions  . Morphine And Related Hives    Past Medical History  Diagnosis Date  . Hyperlipidemia   . Arthritis   . Heart murmur     Past Surgical History  Procedure Laterality Date  . Fracture surgery      ankle  . Ganglion cyst excision    . Colonoscopy      Current Outpatient Prescriptions  Medication Sig Dispense Refill  . fluticasone (CUTIVATE) 0.05 % cream     . HYDROcodone-acetaminophen (NORCO) 5-325 MG tablet Take 1 tablet by mouth every 6 (six) hours as needed for moderate pain. Take 1-1/2 tablets twice a day 120 tablet 0  . LYSINE PO Take by mouth daily.    Marland Kitchen Propylene Glycol (SYSTANE BALANCE OP) Apply to eye daily.     . traZODone (DESYREL) 50 MG tablet Take 0.5-1 tablets (25-50 mg total) by mouth at bedtime as needed for sleep. 30 tablet 3   No current facility-administered medications for this visit.    Social History   Social History  . Marital Status: Married    Spouse Name: N/A  . Number of Children: N/A  . Years of Education: N/A   Occupational History  . Not on file.   Social History Main Topics  . Smoking status: Former Smoker -- 4 years    Types: Cigarettes    Quit date: 09/30/1998  . Smokeless tobacco: Never Used  . Alcohol  Use: 2.0 - 2.5 oz/week    4-5 Standard drinks or equivalent per week     Comment: WINE AND BOURBON - daily  . Drug Use: No  . Sexual Activity:    Partners: Female    Museum/gallery curator: None   Other Topics Concern  . Not on file   Social History Narrative   Married. Education: college and Other. Exercise 4 times a week for 50 minutes.    Family History  Problem  Relation Age of Onset  . Heart disease Mother   . Alcohol abuse Mother   . Dementia Mother   . Alcohol abuse Father   . Colon cancer Father 10    prostate and colon- unsure of primary   . Kidney cancer Brother   . Alcohol abuse Maternal Grandfather   . Alcohol abuse Sister   . Esophageal cancer Neg Hx   . Stomach cancer Neg Hx   . Rectal cancer Neg Hx       CLARKE-PEARSON,Annalise Mcdiarmid L, MD 09/08/2015, 2:15 PM       Consult Note: Gyn-Onc   [name] 69 y.o. female  Chief Complaint  Patient presents with  . endometrial cancer    New consult    Assessment :  Plan:  Interval History:   HPI:  Review of Systems:10 point review of systems is negative except as noted in interval history.   Vitals: Blood pressure 155/64, pulse 82, temperature 98.2 F (36.8 C), temperature source Oral, resp. rate 18, height 5\' 4"  (1.626 m), weight 131 lb 14.4 oz (59.829 kg), SpO2 100 %.  Physical Exam: General : The patient is a healthy woman in no acute distress.  HEENT: normocephalic, extraoccular movements normal; neck is supple without thyromegally  Lynphnodes: Supraclavicular and inguinal nodes not enlarged  Abdomen: Soft, non-tender, no ascites, no organomegally, no masses, no hernias  Pelvic:  EGBUS: Normal female  Vagina: Normal, no lesions  Urethra and Bladder: Normal, non-tender  Cervix: Surgically absent  Uterus: Surgically absent  Bi-manual examination: Non-tender; no adenxal masses or nodularity  Rectal: normal sphincter tone, no masses, no blood  Lower extremities: No edema or varicosities. Normal range of motion      Allergies  Allergen Reactions  . Morphine And Related Hives    Past Medical History  Diagnosis Date  . Hyperlipidemia   . Arthritis   . Heart murmur     Past Surgical History  Procedure Laterality Date  . Fracture surgery      ankle  . Ganglion cyst excision    . Colonoscopy      Current Outpatient Prescriptions  Medication Sig  Dispense Refill  . fluticasone (CUTIVATE) 0.05 % cream     . HYDROcodone-acetaminophen (NORCO) 5-325 MG tablet Take 1 tablet by mouth every 6 (six) hours as needed for moderate pain. Take 1-1/2 tablets twice a day 120 tablet 0  . LYSINE PO Take by mouth daily.    Marland Kitchen Propylene Glycol (SYSTANE BALANCE OP) Apply to eye daily.     . traZODone (DESYREL) 50 MG tablet Take 0.5-1 tablets (25-50 mg total) by mouth at bedtime as needed for sleep. 30 tablet 3   No current facility-administered medications for this visit.    Social History   Social History  . Marital Status: Married    Spouse Name: N/A  . Number of Children: N/A  . Years of Education: N/A   Occupational History  . Not on file.   Social History Main Topics  .  Smoking status: Former Smoker -- 4 years    Types: Cigarettes    Quit date: 09/30/1998  . Smokeless tobacco: Never Used  . Alcohol Use: 2.0 - 2.5 oz/week    4-5 Standard drinks or equivalent per week     Comment: WINE AND BOURBON - daily  . Drug Use: No  . Sexual Activity:    Partners: Female    Museum/gallery curator: None   Other Topics Concern  . Not on file   Social History Narrative   Married. Education: college and Other. Exercise 4 times a week for 50 minutes.    Family History  Problem Relation Age of Onset  . Heart disease Mother   . Alcohol abuse Mother   . Dementia Mother   . Alcohol abuse Father   . Colon cancer Father 69    prostate and colon- unsure of primary   . Kidney cancer Brother   . Alcohol abuse Maternal Grandfather   . Alcohol abuse Sister   . Esophageal cancer Neg Hx   . Stomach cancer Neg Hx   . Rectal cancer Neg Hx       CLARKE-PEARSON,Ilana Prezioso L, MD 09/08/2015, 2:15 PM

## 2015-09-08 NOTE — Patient Instructions (Signed)
Plan for surgery at [facility].  You will receive a phone call from Tresa Garter, [facility] scheduler for GYN ONC at [facility].  Please call for any questions or concerns.

## 2015-09-11 ENCOUNTER — Ambulatory Visit: Payer: Medicare Other | Admitting: Gynecologic Oncology

## 2015-09-19 ENCOUNTER — Encounter (HOSPITAL_COMMUNITY): Payer: Self-pay

## 2015-09-19 ENCOUNTER — Encounter (HOSPITAL_COMMUNITY)
Admission: RE | Admit: 2015-09-19 | Discharge: 2015-09-19 | Disposition: A | Discharge status: Home / Self Care | Payer: Medicare Other | Source: Ambulatory Visit | Attending: Gynecologic Oncology | Admitting: Gynecologic Oncology

## 2015-09-19 DIAGNOSIS — C541 Malignant neoplasm of endometrium: Secondary | ICD-10-CM | POA: Diagnosis not present

## 2015-09-19 DIAGNOSIS — Z01812 Encounter for preprocedural laboratory examination: Secondary | ICD-10-CM | POA: Diagnosis not present

## 2015-09-19 HISTORY — DX: Pain, unspecified: R52

## 2015-09-19 LAB — CBC
HCT: 41 % (ref 36.0–46.0)
Hemoglobin: 13.8 g/dL (ref 12.0–15.0)
MCH: 32.4 pg (ref 26.0–34.0)
MCHC: 33.7 g/dL (ref 30.0–36.0)
MCV: 96.2 fL (ref 78.0–100.0)
Platelets: 233 10*3/uL (ref 150–400)
RBC: 4.26 MIL/uL (ref 3.87–5.11)
RDW: 12.4 % (ref 11.5–15.5)
WBC: 5.9 10*3/uL (ref 4.0–10.5)

## 2015-09-19 LAB — COMPREHENSIVE METABOLIC PANEL
ALT: 19 U/L (ref 14–54)
AST: 24 U/L (ref 15–41)
Albumin: 4.7 g/dL (ref 3.5–5.0)
Alkaline Phosphatase: 56 U/L (ref 38–126)
Anion gap: 9 (ref 5–15)
BUN: 20 mg/dL (ref 6–20)
CO2: 28 mmol/L (ref 22–32)
Calcium: 9.9 mg/dL (ref 8.9–10.3)
Chloride: 104 mmol/L (ref 101–111)
Creatinine, Ser: 0.91 mg/dL (ref 0.44–1.00)
GFR calc Af Amer: >60 mL/min (ref >60)
GFR calc non Af Amer: >60 mL/min (ref >60)
Glucose, Bld: 106 mg/dL — ABNORMAL HIGH (ref 65–99)
Potassium: 4.9 mmol/L (ref 3.5–5.1)
Sodium: 141 mmol/L (ref 135–145)
Total Bilirubin: 0.8 mg/dL (ref 0.3–1.2)
Total Protein: 7.4 g/dL (ref 6.5–8.1)

## 2015-09-19 LAB — URINALYSIS, ROUTINE W REFLEX MICROSCOPIC
Bilirubin Urine: NEGATIVE
Glucose, UA: NEGATIVE mg/dL
Hgb urine dipstick: NEGATIVE
Ketones, ur: NEGATIVE mg/dL
Nitrite: NEGATIVE
Protein, ur: NEGATIVE mg/dL
Specific Gravity, Urine: 1.023 (ref 1.005–1.030)
pH: 7 (ref 5.0–8.0)

## 2015-09-19 LAB — URINE MICROSCOPIC-ADD ON
Bacteria, UA: NONE SEEN
Squamous Epithelial / LPF: NONE SEEN

## 2015-09-19 LAB — ABO/RH: ABO/RH(D): A POS

## 2015-09-19 NOTE — Progress Notes (Signed)
09-19-15 1620 note urinalysis result in Epic done today.

## 2015-09-19 NOTE — Pre-Procedure Instructions (Addendum)
02-01-15 EKG with chart. EKG/CXR , CT Chest 3'16 Epic. LOV note -Dr. Einar Gip- 02-01-15 with chart.

## 2015-09-19 NOTE — Patient Instructions (Addendum)
[place]  09/19/2015   Your procedure is scheduled on:   -12-27- 2016 Tuesday  Enter through [facility]  Entrance and follow signs to [facility] to [place]. Arrive at   1030     AM .  (Limit 1 person with you).  Call this number if you have problems the morning of surgery: [phone]  Or Presurgical Testing [phone] days before.   For Living Will and/or Health Care Power Attorney Forms: please provide copy for your medical record,may bring AM of surgery(Forms should be already notarized -we do not provide this service).(Yes/will bring  information the day of surgery ).  Remember: Follow any bowel prep instructions per MD office.(Clear Liquids only day before surgery-see instructions given)    Do not eat food/ or drink: After Midnight.  Exception: may have clear liquids:up to 6 Hours before arrival. Nothing after: 0700 AM   CLEAR LIQUID DIET   Foods Allowed                                                                     Foods Excluded  Coffee and tea, regular and decaf                             liquids that you cannot  Plain Jell-O in any flavor                                             see through such as: Fruit ices (not with fruit pulp)                                     milk, soups, orange juice  Iced Popsicles                                    All solid food Carbonated beverages, regular and diet                                    Cranberry, grape and apple juices Sports drinks like Gatorade Lightly seasoned clear broth or consume(fat free) Sugar, honey syrup  Sample Menu Breakfast                                Lunch                                     Supper Cranberry juice                    Beef broth                            Chicken  broth Jell-O                                     Grape juice                           Apple juice Coffee or tea                        Jell-O                                       Popsicle                                                Coffee or tea                        Coffee or tea  _____________________________________________________________________    Clear liquids include soda, tea, black coffee, apple or grape juice, broth.  Take these medicines the morning of surgery with A SIP OF WATER-   (DO NOT TAKE ANY DIABETIC MEDS AM OF SURGERY) :    Do not wear jewelry, make-up or nail polish.  Do not wear deodorant, lotions, powders, or perfumes.   Do not shave legs and under arms- 48 hours(2 days) prior to first CHG shower.(Shaving face and neck okay.)  Do not bring valuables to the hospital.(Hospital is not responsible for lost valuables).  Contacts, dentures or removable bridgework, body piercing, hair pins may not be worn into surgery.  Leave suitcase in the car. After surgery it may be brought to your room.  For patients admitted to the hospital, checkout time is 11:00 AM the day of discharge.(Restricted visitors-Any Persons displaying flu-like symptoms or illness).    Patients discharged the day of surgery will not be allowed to drive home. Must have responsible person with you x 24 hours once discharged.  Name and phone number of your driver: Anthony-spouse [phone] cell     Please read over the following fact sheets that you were given:  CHG(Chlorhexidine Gluconate 4% Surgical Soap) use, MRSA Information, Blood Transfusion fact sheet, Incentive Spirometry Instruction.  Remember : Type/Screen "Blue armbands" - may not be removed once applied(would result in being retested AM of surgery, if removed).         [place] - Preparing for Surgery Before surgery, you can play an important role.  Because skin is not sterile, your skin needs to be as free of germs as possible.  You can reduce the number of germs on your skin by washing with CHG (chlorahexidine gluconate) soap before surgery.  CHG is an antiseptic cleaner which kills germs and bonds with the  skin to continue killing germs even after washing. Please DO NOT use if you have an allergy to CHG or antibacterial soaps.  If your skin becomes reddened/irritated stop using the CHG and inform your nurse when you arrive at Short Stay. Do not shave (including legs and underarms) for at least 48 hours prior to the first CHG shower.  You may shave your face/neck. Please follow these instructions carefully:  1.  Shower  with CHG Soap the night before surgery and the  morning of Surgery.  2.  If you choose to wash your hair, wash your hair first as usual with your  normal  shampoo.  3.  After you shampoo, rinse your hair and body thoroughly to remove the  shampoo.                           4.  Use CHG as you would any other liquid soap.  You can apply chg directly  to the skin and wash                       Gently with a scrungie or clean washcloth.  5.  Apply the CHG Soap to your body ONLY FROM THE NECK DOWN.   Do not use on face/ open                           Wound or open sores. Avoid contact with eyes, ears mouth and genitals (private parts).                       Wash face,  Genitals (private parts) with your normal soap.             6.  Wash thoroughly, paying special attention to the area where your surgery  will be performed.  7.  Thoroughly rinse your body with warm water from the neck down.  8.  DO NOT shower/wash with your normal soap after using and rinsing off  the CHG Soap.                9.  Pat yourself dry with a clean towel.            10.  Wear clean pajamas.            11.  Place clean sheets on your bed the night of your first shower and do not  sleep with pets. Day of Surgery : Do not apply any lotions/deodorants the morning of surgery.  Please wear clean clothes to the hospital/surgery center.  FAILURE TO FOLLOW THESE INSTRUCTIONS MAY RESULT IN THE CANCELLATION OF YOUR SURGERY PATIENT SIGNATURE_________________________________  NURSE  SIGNATURE__________________________________  ________________________________________________________________________  WHAT IS A BLOOD TRANSFUSION? Blood Transfusion Information  A transfusion is the replacement of blood or some of its parts. Blood is made up of multiple cells which provide different functions.  Red blood cells carry oxygen and are used for blood loss replacement.  White blood cells fight against infection.  Platelets control bleeding.  Plasma helps clot blood.  Other blood products are available for specialized needs, such as hemophilia or other clotting disorders. BEFORE THE TRANSFUSION  Who gives blood for transfusions?   Healthy volunteers who are fully evaluated to make sure their blood is safe. This is blood bank blood. Transfusion therapy is the safest it has ever been in the practice of medicine. Before blood is taken from a donor, a complete history is taken to make sure that person has no history of diseases nor engages in risky social behavior (examples are intravenous drug use or sexual activity with multiple partners). The donor's travel history is screened to minimize risk of transmitting infections, such as malaria. The donated blood is tested for signs of infectious diseases, such as HIV and hepatitis. The blood is then tested to be  sure it is compatible with you in order to minimize the chance of a transfusion reaction. If you or a relative donates blood, this is often done in anticipation of surgery and is not appropriate for emergency situations. It takes many days to process the donated blood. RISKS AND COMPLICATIONS Although transfusion therapy is very safe and saves many lives, the main dangers of transfusion include:  1. Getting an infectious disease. 2. Developing a transfusion reaction. This is an allergic reaction to something in the blood you were given. Every precaution is taken to prevent this. The decision to have a blood transfusion has been  considered carefully by your caregiver before blood is given. Blood is not given unless the benefits outweigh the risks. AFTER THE TRANSFUSION  Right after receiving a blood transfusion, you will usually feel much better and more energetic. This is especially true if your red blood cells have gotten low (anemic). The transfusion raises the level of the red blood cells which carry oxygen, and this usually causes an energy increase.  The nurse administering the transfusion will monitor you carefully for complications. HOME CARE INSTRUCTIONS  No special instructions are needed after a transfusion. You may find your energy is better. Speak with your caregiver about any limitations on activity for underlying diseases you may have. SEEK MEDICAL CARE IF:   Your condition is not improving after your transfusion.  You develop redness or irritation at the intravenous (IV) site. SEEK IMMEDIATE MEDICAL CARE IF:  Any of the following symptoms occur over the next 12 hours:  Shaking chills.  You have a temperature by mouth above 102 F (38.9 C), not controlled by medicine.  Chest, back, or muscle pain.  People around you feel you are not acting correctly or are confused.  Shortness of breath or difficulty breathing.  Dizziness and fainting.  You get a rash or develop hives.  You have a decrease in urine output.  Your urine turns a dark color or changes to pink, red, or brown. Any of the following symptoms occur over the next 10 days:  You have a temperature by mouth above 102 F (38.9 C), not controlled by medicine.  Shortness of breath.  Weakness after normal activity.  The white part of the eye turns yellow (jaundice).  You have a decrease in the amount of urine or are urinating less often.  Your urine turns a dark color or changes to pink, red, or brown. Document Released: 09/13/2000 Document Revised: 12/09/2011 Document Reviewed: 05/02/2008 ExitCare Patient Information 2014  [place].  _______________________________________________________________________  Incentive Spirometer  An incentive spirometer is a tool that can help keep your lungs clear and active. This tool measures how well you are filling your lungs with each breath. Taking long deep breaths may help reverse or decrease the chance of developing breathing (pulmonary) problems (especially infection) following:  A long period of time when you are unable to move or be active. BEFORE THE PROCEDURE   If the spirometer includes an indicator to show your best effort, your nurse or respiratory therapist will set it to a desired goal.  If possible, sit up straight or lean slightly forward. Try not to slouch.  Hold the incentive spirometer in an upright position. INSTRUCTIONS FOR USE  3. Sit on the edge of your bed if possible, or sit up as far as you can in bed or on a chair. 4. Hold the incentive spirometer in an upright position. 5. Breathe out normally. 6. Place the mouthpiece  in your mouth and seal your lips tightly around it. 7. Breathe in slowly and as deeply as possible, raising the piston or the ball toward the top of the column. 8. Hold your breath for 3-5 seconds or for as long as possible. Allow the piston or ball to fall to the bottom of the column. 9. Remove the mouthpiece from your mouth and breathe out normally. 10. Rest for a few seconds and repeat Steps 1 through 7 at least 10 times every 1-2 hours when you are awake. Take your time and take a few normal breaths between deep breaths. 11. The spirometer may include an indicator to show your best effort. Use the indicator as a goal to work toward during each repetition. 12. After each set of 10 deep breaths, practice coughing to be sure your lungs are clear. If you have an incision (the cut made at the time of surgery), support your incision when coughing by placing a pillow or rolled up towels firmly against it. Once you are able to  get out of bed, walk around indoors and cough well. You may stop using the incentive spirometer when instructed by your caregiver.  RISKS AND COMPLICATIONS  Take your time so you do not get dizzy or light-headed.  If you are in pain, you may need to take or ask for pain medication before doing incentive spirometry. It is harder to take a deep breath if you are having pain. AFTER USE  Rest and breathe slowly and easily.  It can be helpful to keep track of a log of your progress. Your caregiver can provide you with a simple table to help with this. If you are using the spirometer at home, follow these instructions: [place] IF:   You are having difficultly using the spirometer.  You have trouble using the spirometer as often as instructed.  Your pain medication is not giving enough relief while using the spirometer.  You develop fever of 100.5 F (38.1 C) or higher. SEEK IMMEDIATE MEDICAL CARE IF:   You cough up bloody sputum that had not been present before.  You develop fever of 102 F (38.9 C) or greater.  You develop worsening pain at or near the incision site. MAKE SURE YOU:   Understand these instructions.  Will watch your condition.  Will get help right away if you are not doing well or get worse. Document Released: 01/27/2007 Document Revised: 12/09/2011 Document Reviewed: 03/30/2007 [facility] Patient Information 2014 [place], [place].   ________________________________________________________________________

## 2015-09-19 NOTE — Pre-Procedure Instructions (Signed)
09-19-15 1620 urinalysis results faxed note to Dr. Serita Grit office.

## 2015-09-20 ENCOUNTER — Telehealth: Payer: Self-pay | Admitting: Gynecologic Oncology

## 2015-09-20 NOTE — Progress Notes (Signed)
09-20-15 [id number] Urinalysis reported to "Maudie Mercury" - Dr. Serita Grit office to make [facility] aware.

## 2015-09-20 NOTE — Telephone Encounter (Signed)
Returned call to patient.  Patient had called earlier stating one of her grandchildren she will be seeing this weekend has congestion and she is concerned about catching a cold.  Advised to use proper handwashing and to avoid anyone with active cough or fever.  All questions answered.  Advised to call for any questions or concerns.

## 2015-09-26 ENCOUNTER — Ambulatory Visit (HOSPITAL_COMMUNITY): Payer: Medicare Other | Admitting: Anesthesiology

## 2015-09-26 ENCOUNTER — Encounter (HOSPITAL_COMMUNITY): Payer: Self-pay | Admitting: *Deleted

## 2015-09-26 ENCOUNTER — Encounter (HOSPITAL_COMMUNITY)
Admission: RE | Disposition: A | Discharge status: Home / Self Care | Payer: Self-pay | Source: Ambulatory Visit | Attending: Gynecologic Oncology

## 2015-09-26 ENCOUNTER — Ambulatory Visit (HOSPITAL_COMMUNITY)
Admission: RE | Admit: 2015-09-26 | Discharge: 2015-09-27 | Disposition: A | Discharge status: Home / Self Care | Payer: Medicare Other | Source: Ambulatory Visit | Attending: Gynecologic Oncology | Admitting: Gynecologic Oncology

## 2015-09-26 DIAGNOSIS — M542 Cervicalgia: Secondary | ICD-10-CM

## 2015-09-26 DIAGNOSIS — I10 Essential (primary) hypertension: Secondary | ICD-10-CM | POA: Insufficient documentation

## 2015-09-26 DIAGNOSIS — M4120 Other idiopathic scoliosis, site unspecified: Secondary | ICD-10-CM

## 2015-09-26 DIAGNOSIS — Z79899 Other long term (current) drug therapy: Secondary | ICD-10-CM | POA: Insufficient documentation

## 2015-09-26 DIAGNOSIS — E785 Hyperlipidemia, unspecified: Secondary | ICD-10-CM | POA: Insufficient documentation

## 2015-09-26 DIAGNOSIS — R011 Cardiac murmur, unspecified: Secondary | ICD-10-CM | POA: Diagnosis not present

## 2015-09-26 DIAGNOSIS — Z87891 Personal history of nicotine dependence: Secondary | ICD-10-CM | POA: Insufficient documentation

## 2015-09-26 DIAGNOSIS — C541 Malignant neoplasm of endometrium: Secondary | ICD-10-CM

## 2015-09-26 DIAGNOSIS — M546 Pain in thoracic spine: Secondary | ICD-10-CM

## 2015-09-26 DIAGNOSIS — M1990 Unspecified osteoarthritis, unspecified site: Secondary | ICD-10-CM | POA: Insufficient documentation

## 2015-09-26 HISTORY — DX: Malignant (primary) neoplasm, unspecified: C80.1

## 2015-09-26 HISTORY — PX: ROBOTIC ASSISTED TOTAL HYSTERECTOMY WITH BILATERAL SALPINGO OOPHERECTOMY: SHX6086

## 2015-09-26 HISTORY — PX: LYMPH NODE BIOPSY: SHX201

## 2015-09-26 LAB — TYPE AND SCREEN
ABO/RH(D): A POS
Antibody Screen: NEGATIVE

## 2015-09-26 SURGERY — ROBOTIC ASSISTED TOTAL HYSTERECTOMY WITH BILATERAL SALPINGO OOPHORECTOMY
Anesthesia: General

## 2015-09-26 MED ORDER — STERILE WATER FOR INJECTION IJ SOLN
INTRAMUSCULAR | Status: DC | PRN
  Administered 2015-09-26: 10 mL

## 2015-09-26 MED ORDER — LIDOCAINE HCL (CARDIAC) 20 MG/ML IV SOLN
INTRAVENOUS | Status: AC
  Filled 2015-09-26: qty 5

## 2015-09-26 MED ORDER — LACTATED RINGERS IV SOLN
INTRAVENOUS | Status: DC
  Administered 2015-09-26: 15:00:00 via INTRAVENOUS
  Administered 2015-09-26: 1000 mL via INTRAVENOUS

## 2015-09-26 MED ORDER — LACTATED RINGERS IV SOLN: INTRAVENOUS | Status: DC

## 2015-09-26 MED ORDER — LIDOCAINE HCL (CARDIAC) 20 MG/ML IV SOLN
INTRAVENOUS | Status: DC | PRN
  Administered 2015-09-26: 50 mg via INTRAVENOUS

## 2015-09-26 MED ORDER — HYDROMORPHONE HCL 1 MG/ML IJ SOLN
INTRAMUSCULAR | Status: AC
  Filled 2015-09-26: qty 1

## 2015-09-26 MED ORDER — KCL IN DEXTROSE-NACL 20-5-0.45 MEQ/L-%-% IV SOLN
INTRAVENOUS | Status: DC
  Administered 2015-09-26: 21:00:00 via INTRAVENOUS
  Filled 2015-09-26 (×2): qty 1000

## 2015-09-26 MED ORDER — PROPOFOL 10 MG/ML IV BOLUS
INTRAVENOUS | Status: DC | PRN
  Administered 2015-09-26: 120 mg via INTRAVENOUS

## 2015-09-26 MED ORDER — STERILE WATER FOR IRRIGATION IR SOLN
Status: DC | PRN
  Administered 2015-09-26: 1000 mL

## 2015-09-26 MED ORDER — ONDANSETRON HCL 4 MG PO TABS: 4.0000 mg | ORAL_TABLET | Freq: Four times a day (QID) | ORAL | Status: DC | PRN

## 2015-09-26 MED ORDER — TRAMADOL HCL 50 MG PO TABS
100.0000 mg | ORAL_TABLET | Freq: Two times a day (BID) | ORAL | Status: DC | PRN
  Administered 2015-09-27: 100 mg via ORAL
  Filled 2015-09-26: qty 2

## 2015-09-26 MED ORDER — LACTATED RINGERS IR SOLN
Status: DC | PRN
  Administered 2015-09-26: 1000 mL

## 2015-09-26 MED ORDER — STERILE WATER FOR INJECTION IJ SOLN
INTRAMUSCULAR | Status: AC
  Filled 2015-09-26: qty 10

## 2015-09-26 MED ORDER — ROCURONIUM BROMIDE 100 MG/10ML IV SOLN
INTRAVENOUS | Status: DC | PRN
  Administered 2015-09-26: 50 mg via INTRAVENOUS
  Administered 2015-09-26: 10 mg via INTRAVENOUS

## 2015-09-26 MED ORDER — SUGAMMADEX SODIUM 200 MG/2ML IV SOLN
INTRAVENOUS | Status: DC | PRN
  Administered 2015-09-26: 120 mg via INTRAVENOUS

## 2015-09-26 MED ORDER — DEXAMETHASONE SODIUM PHOSPHATE 10 MG/ML IJ SOLN
INTRAMUSCULAR | Status: AC
  Filled 2015-09-26: qty 1

## 2015-09-26 MED ORDER — SUFENTANIL CITRATE 50 MCG/ML IV SOLN
INTRAVENOUS | Status: DC | PRN
  Administered 2015-09-26: 15 ug via INTRAVENOUS
  Administered 2015-09-26: 5 ug via INTRAVENOUS
  Administered 2015-09-26 (×2): 10 ug via INTRAVENOUS
  Administered 2015-09-26: 5 ug via INTRAVENOUS

## 2015-09-26 MED ORDER — IBUPROFEN 800 MG PO TABS
800.0000 mg | ORAL_TABLET | Freq: Three times a day (TID) | ORAL | Status: DC | PRN
  Filled 2015-09-26: qty 1

## 2015-09-26 MED ORDER — ONDANSETRON HCL 4 MG/2ML IJ SOLN: 4.0000 mg | Freq: Four times a day (QID) | INTRAMUSCULAR | Status: DC | PRN

## 2015-09-26 MED ORDER — ROCURONIUM BROMIDE 100 MG/10ML IV SOLN
INTRAVENOUS | Status: AC
  Filled 2015-09-26: qty 1

## 2015-09-26 MED ORDER — LABETALOL HCL 5 MG/ML IV SOLN
INTRAVENOUS | Status: DC | PRN
  Administered 2015-09-26: 5 mg via INTRAVENOUS

## 2015-09-26 MED ORDER — PROPOFOL 10 MG/ML IV BOLUS
INTRAVENOUS | Status: AC
  Filled 2015-09-26: qty 20

## 2015-09-26 MED ORDER — ONDANSETRON HCL 4 MG/2ML IJ SOLN
INTRAMUSCULAR | Status: AC
  Filled 2015-09-26: qty 2

## 2015-09-26 MED ORDER — EPHEDRINE SULFATE 50 MG/ML IJ SOLN
INTRAMUSCULAR | Status: DC | PRN
  Administered 2015-09-26: 10 mg via INTRAVENOUS

## 2015-09-26 MED ORDER — CEFAZOLIN SODIUM-DEXTROSE 2-3 GM-% IV SOLR
INTRAVENOUS | Status: AC
  Filled 2015-09-26: qty 50

## 2015-09-26 MED ORDER — GABAPENTIN 400 MG PO CAPS
400.0000 mg | ORAL_CAPSULE | Freq: Every day | ORAL | Status: AC
  Administered 2015-09-26: 400 mg via ORAL
  Filled 2015-09-26 (×2): qty 1

## 2015-09-26 MED ORDER — ENOXAPARIN SODIUM 40 MG/0.4ML ~~LOC~~ SOLN
40.0000 mg | SUBCUTANEOUS | Status: AC
  Administered 2015-09-26: 40 mg via SUBCUTANEOUS
  Filled 2015-09-26: qty 0.4

## 2015-09-26 MED ORDER — SUFENTANIL CITRATE 50 MCG/ML IV SOLN
INTRAVENOUS | Status: AC
  Filled 2015-09-26: qty 1

## 2015-09-26 MED ORDER — HYDROMORPHONE HCL 1 MG/ML IJ SOLN
0.2000 mg | INTRAMUSCULAR | Status: AC | PRN
  Administered 2015-09-26 – 2015-09-27 (×2): 0.6 mg via INTRAVENOUS
  Filled 2015-09-26 (×2): qty 1

## 2015-09-26 MED ORDER — CEFAZOLIN SODIUM-DEXTROSE 2-3 GM-% IV SOLR
2.0000 g | INTRAVENOUS | Status: AC
  Administered 2015-09-26: 2 g via INTRAVENOUS

## 2015-09-26 MED ORDER — HYDROMORPHONE HCL 1 MG/ML IJ SOLN
0.2500 mg | INTRAMUSCULAR | Status: DC | PRN
  Administered 2015-09-26 (×2): 0.25 mg via INTRAVENOUS
  Administered 2015-09-26: 0.5 mg via INTRAVENOUS

## 2015-09-26 MED ORDER — DEXAMETHASONE SODIUM PHOSPHATE 10 MG/ML IJ SOLN
INTRAMUSCULAR | Status: DC | PRN
  Administered 2015-09-26: 10 mg via INTRAVENOUS

## 2015-09-26 MED ORDER — SODIUM CHLORIDE 0.9 % IJ SOLN
INTRAMUSCULAR | Status: AC
  Filled 2015-09-26: qty 10

## 2015-09-26 MED ORDER — ACETAMINOPHEN 500 MG PO TABS
1000.0000 mg | ORAL_TABLET | Freq: Two times a day (BID) | ORAL | Status: DC
  Administered 2015-09-26 – 2015-09-27 (×2): 1000 mg via ORAL
  Filled 2015-09-26 (×3): qty 2

## 2015-09-26 MED ORDER — ONDANSETRON HCL 4 MG/2ML IJ SOLN
INTRAMUSCULAR | Status: DC | PRN
  Administered 2015-09-26: 4 mg via INTRAVENOUS

## 2015-09-26 MED ORDER — ENOXAPARIN SODIUM 40 MG/0.4ML ~~LOC~~ SOLN
40.0000 mg | SUBCUTANEOUS | Status: DC
  Administered 2015-09-27: 40 mg via SUBCUTANEOUS
  Filled 2015-09-26 (×2): qty 0.4

## 2015-09-26 MED ORDER — SUGAMMADEX SODIUM 200 MG/2ML IV SOLN
INTRAVENOUS | Status: AC
  Filled 2015-09-26: qty 2

## 2015-09-26 SURGICAL SUPPLY — 57 items
BAG SPEC RTRVL LRG 6X4 10 (ENDOMECHANICALS)
CABLE HIGH FREQUENCY MONO STRZ (ELECTRODE) ×2 IMPLANT
CHLORAPREP W/TINT 26ML (MISCELLANEOUS) ×3 IMPLANT
CORDS BIPOLAR (ELECTRODE) ×2 IMPLANT
COVER SURGICAL LIGHT HANDLE (MISCELLANEOUS) ×3 IMPLANT
COVER TIP SHEARS 8 DVNC (MISCELLANEOUS) ×2 IMPLANT
COVER TIP SHEARS 8MM DA VINCI (MISCELLANEOUS) ×1
DRAPE ARM DVNC X/XI (DISPOSABLE) IMPLANT
DRAPE COLUMN DVNC XI (DISPOSABLE) IMPLANT
DRAPE DA VINCI XI ARM (DISPOSABLE) ×4
DRAPE DA VINCI XI COLUMN (DISPOSABLE) ×1
DRAPE SHEET LG 3/4 BI-LAMINATE (DRAPES) ×6 IMPLANT
DRAPE SURG IRRIG POUCH 19X23 (DRAPES) ×3 IMPLANT
DRAPE TABLE BACK 44X90 PK DISP (DRAPES) ×4 IMPLANT
DRAPE WARM FLUID 44X44 (DRAPE) ×2 IMPLANT
ELECT REM PT RETURN 9FT ADLT (ELECTROSURGICAL) ×3
ELECTRODE REM PT RTRN 9FT ADLT (ELECTROSURGICAL) ×2 IMPLANT
GLOVE BIO SURGEON STRL SZ 6 (GLOVE) ×12 IMPLANT
GLOVE BIO SURGEON STRL SZ 6.5 (GLOVE) ×6 IMPLANT
GOWN STRL REUS W/ TWL LRG LVL3 (GOWN DISPOSABLE) ×6 IMPLANT
GOWN STRL REUS W/TWL LRG LVL3 (GOWN DISPOSABLE) ×15
HOLDER FOLEY CATH W/STRAP (MISCELLANEOUS) ×3 IMPLANT
KIT BASIN OR (CUSTOM PROCEDURE TRAY) ×3 IMPLANT
KIT PROCEDURE DA VINCI SI (MISCELLANEOUS) ×1
KIT PROCEDURE DVNC SI (MISCELLANEOUS) IMPLANT
LIQUID BAND (GAUZE/BANDAGES/DRESSINGS) ×3 IMPLANT
MANIPULATOR UTERINE 4.5 ZUMI (MISCELLANEOUS) ×3 IMPLANT
MARKER SKIN DUAL TIP RULER LAB (MISCELLANEOUS) ×3 IMPLANT
NDL SAFETY ECLIPSE 18X1.5 (NEEDLE) IMPLANT
NDL SPNL 18GX3.5 QUINCKE PK (NEEDLE) IMPLANT
NEEDLE HYPO 18GX1.5 SHARP (NEEDLE) ×3
NEEDLE SPNL 18GX3.5 QUINCKE PK (NEEDLE) ×3 IMPLANT
OCCLUDER COLPOPNEUMO (BALLOONS) ×3 IMPLANT
PAD POSITIONING PINK XL (MISCELLANEOUS) ×1 IMPLANT
PORT ACCESS TROCAR AIRSEAL 12 (TROCAR) IMPLANT
PORT ACCESS TROCAR AIRSEAL 5M (TROCAR) ×1
POUCH SPECIMEN RETRIEVAL 10MM (ENDOMECHANICALS) IMPLANT
SEAL CANN UNIV 5-8 DVNC XI (MISCELLANEOUS) IMPLANT
SEAL XI 5MM-8MM UNIVERSAL (MISCELLANEOUS) ×4
SET TRI-LUMEN FLTR TB AIRSEAL (TUBING) ×3 IMPLANT
SET TUBE IRRIG SUCTION NO TIP (IRRIGATION / IRRIGATOR) ×3 IMPLANT
SHEET LAVH (DRAPES) ×3 IMPLANT
SOLUTION ELECTROLUBE (MISCELLANEOUS) ×3 IMPLANT
SUT VIC AB 0 CT1 27 (SUTURE) ×3
SUT VIC AB 0 CT1 27XBRD ANTBC (SUTURE) ×2 IMPLANT
SUT VIC AB 4-0 PS2 27 (SUTURE) ×6 IMPLANT
SYR 50ML LL SCALE MARK (SYRINGE) ×3 IMPLANT
SYRINGE 10CC LL (SYRINGE) ×1 IMPLANT
TOWEL OR 17X26 10 PK STRL BLUE (TOWEL DISPOSABLE) ×6 IMPLANT
TOWEL OR NON WOVEN STRL DISP B (DISPOSABLE) ×3 IMPLANT
TRAP SPECIMEN MUCOUS 40CC (MISCELLANEOUS) ×1 IMPLANT
TRAY FOLEY W/METER SILVER 14FR (SET/KITS/TRAYS/PACK) ×3 IMPLANT
TRAY LAPAROSCOPIC (CUSTOM PROCEDURE TRAY) ×3 IMPLANT
TROCAR 12M 150ML BLUNT (TROCAR) ×2 IMPLANT
TROCAR BLADELESS OPT 5 100 (ENDOMECHANICALS) ×3 IMPLANT
TROCAR PORT AIRSEAL 5X120 (TROCAR) IMPLANT
WATER STERILE IRR 1500ML POUR (IV SOLUTION) ×4 IMPLANT

## 2015-09-26 NOTE — Interval H&P Note (Signed)
History and Physical Interval Note:  09/26/2015 1:42 PM  Dawn Powers  has presented today for surgery, with the diagnosis of ENDOMETRIAL CANCER  The various methods of treatment have been discussed with the patient and family. After consideration of risks, benefits and other options for treatment, the patient has consented to  Procedure(s): ROBOTIC ASSISTED TOTAL HYSTERECTOMY WITH BILATERAL SALPINGO OOPHORECTOMY (Bilateral) SENTINEL LYMPH NODE BIOPSY (N/A) as a surgical intervention .  The patient's history has been reviewed, patient examined, no change in status, stable for surgery.  I have reviewed the patient's chart and labs.  Questions were answered to the patient's satisfaction.     Donaciano Eva

## 2015-09-26 NOTE — H&P (View-Only) (Signed)
Consult Note: Gyn-Onc   Dawn Powers 69 y.o. female  Chief Complaint  Patient presents with  . endometrial cancer    New consult    Assessment : Grade 2 endometrial adenocarcinoma. The natural history of this disease and treatment were reviewed with the patient and her husband. Recommend she undergo a robotic-assisted hysterectomy and bilateral salpingo-oophorectomy and pelvic and aortic lymphadenectomy. Surgery scheduled for 09/26/2015. The risks of surgery including hemorrhage infection injury to adjacent viscera and thromboembolic complications as well as anesthetic risks were reviewed. Patient understands and Dr. Everitt Amber will be a primary surgeon. All the patient's questions are answered.  HPI: 69 year old white married female gravida 2 seen in consultation request of Dr. Sherren Mocha Meisinger regarding management of a newly diagnosed endometrial adenocarcinoma. The patient has had several weeks of spotting before seeing Dr. Phineas Douglas on 08/18/2015. An endometrial biopsy was obtained revealing a grade 2 endometrial carcinoma. The uterus sounded 7.5 cm.  The patient's had an unremarkable gynecologic history. She has had an endometrial polyp removed previously.  She denies any family history of gynecologic or breast cancer. Her father had prostate cancer.  Review of Systems:10 point review of systems is negative except as noted in interval history.   Vitals: Blood pressure 155/64, pulse 82, temperature 98.2 F (36.8 C), temperature source Oral, resp. rate 18, height 5\' 4"  (1.626 m), weight 131 lb 14.4 oz (59.829 kg), SpO2 100 %.  Physical Exam: General : The patient is a healthy woman in no acute distress.  HEENT: normocephalic, extraoccular movements normal; neck is supple without thyromegally  Lynphnodes: Supraclavicular and inguinal nodes not enlarged  Abdomen: Soft, non-tender, no ascites, no organomegally, no masses, no hernias  Pelvic:  EGBUS: Normal female  Vagina: Normal, no  lesions  Urethra and Bladder: Normal, non-tender  Cervix: Normal menopausal Uterus: Anterior normal shape size and consistency Bi-manual examination: Non-tender; no adenxal masses or nodularity  Rectal: normal sphincter tone, no masses, no blood  Lower extremities: No edema or varicosities. Normal range of motion      Allergies  Allergen Reactions  . Morphine And Related Hives    Past Medical History  Diagnosis Date  . Hyperlipidemia   . Arthritis   . Heart murmur     Past Surgical History  Procedure Laterality Date  . Fracture surgery      ankle  . Ganglion cyst excision    . Colonoscopy      Current Outpatient Prescriptions  Medication Sig Dispense Refill  . fluticasone (CUTIVATE) 0.05 % cream     . HYDROcodone-acetaminophen (NORCO) 5-325 MG tablet Take 1 tablet by mouth every 6 (six) hours as needed for moderate pain. Take 1-1/2 tablets twice a day 120 tablet 0  . LYSINE PO Take by mouth daily.    Marland Kitchen Propylene Glycol (SYSTANE BALANCE OP) Apply to eye daily.     . traZODone (DESYREL) 50 MG tablet Take 0.5-1 tablets (25-50 mg total) by mouth at bedtime as needed for sleep. 30 tablet 3   No current facility-administered medications for this visit.    Social History   Social History  . Marital Status: Married    Spouse Name: N/A  . Number of Children: N/A  . Years of Education: N/A   Occupational History  . Not on file.   Social History Main Topics  . Smoking status: Former Smoker -- 4 years    Types: Cigarettes    Quit date: 09/30/1998  . Smokeless tobacco: Never Used  . Alcohol  Use: 2.0 - 2.5 oz/week    4-5 Standard drinks or equivalent per week     Comment: WINE AND BOURBON - daily  . Drug Use: No  . Sexual Activity:    Partners: Female    Museum/gallery curator: None   Other Topics Concern  . Not on file   Social History Narrative   Married. Education: college and Other. Exercise 4 times a week for 50 minutes.    Family History  Problem  Relation Age of Onset  . Heart disease Mother   . Alcohol abuse Mother   . Dementia Mother   . Alcohol abuse Father   . Colon cancer Father 83    prostate and colon- unsure of primary   . Kidney cancer Brother   . Alcohol abuse Maternal Grandfather   . Alcohol abuse Sister   . Esophageal cancer Neg Hx   . Stomach cancer Neg Hx   . Rectal cancer Neg Hx       CLARKE-PEARSON,Annaleia Pence L, MD 09/08/2015, 2:15 PM       Consult Note: Gyn-Onc   Dawn Powers 69 y.o. female  Chief Complaint  Patient presents with  . endometrial cancer    New consult    Assessment :  Plan:  Interval History:   HPI:  Review of Systems:10 point review of systems is negative except as noted in interval history.   Vitals: Blood pressure 155/64, pulse 82, temperature 98.2 F (36.8 C), temperature source Oral, resp. rate 18, height 5\' 4"  (1.626 m), weight 131 lb 14.4 oz (59.829 kg), SpO2 100 %.  Physical Exam: General : The patient is a healthy woman in no acute distress.  HEENT: normocephalic, extraoccular movements normal; neck is supple without thyromegally  Lynphnodes: Supraclavicular and inguinal nodes not enlarged  Abdomen: Soft, non-tender, no ascites, no organomegally, no masses, no hernias  Pelvic:  EGBUS: Normal female  Vagina: Normal, no lesions  Urethra and Bladder: Normal, non-tender  Cervix: Surgically absent  Uterus: Surgically absent  Bi-manual examination: Non-tender; no adenxal masses or nodularity  Rectal: normal sphincter tone, no masses, no blood  Lower extremities: No edema or varicosities. Normal range of motion      Allergies  Allergen Reactions  . Morphine And Related Hives    Past Medical History  Diagnosis Date  . Hyperlipidemia   . Arthritis   . Heart murmur     Past Surgical History  Procedure Laterality Date  . Fracture surgery      ankle  . Ganglion cyst excision    . Colonoscopy      Current Outpatient Prescriptions  Medication Sig  Dispense Refill  . fluticasone (CUTIVATE) 0.05 % cream     . HYDROcodone-acetaminophen (NORCO) 5-325 MG tablet Take 1 tablet by mouth every 6 (six) hours as needed for moderate pain. Take 1-1/2 tablets twice a day 120 tablet 0  . LYSINE PO Take by mouth daily.    Marland Kitchen Propylene Glycol (SYSTANE BALANCE OP) Apply to eye daily.     . traZODone (DESYREL) 50 MG tablet Take 0.5-1 tablets (25-50 mg total) by mouth at bedtime as needed for sleep. 30 tablet 3   No current facility-administered medications for this visit.    Social History   Social History  . Marital Status: Married    Spouse Name: N/A  . Number of Children: N/A  . Years of Education: N/A   Occupational History  . Not on file.   Social History Main Topics  .  Smoking status: Former Smoker -- 4 years    Types: Cigarettes    Quit date: 09/30/1998  . Smokeless tobacco: Never Used  . Alcohol Use: 2.0 - 2.5 oz/week    4-5 Standard drinks or equivalent per week     Comment: WINE AND BOURBON - daily  . Drug Use: No  . Sexual Activity:    Partners: Female    Museum/gallery curator: None   Other Topics Concern  . Not on file   Social History Narrative   Married. Education: college and Other. Exercise 4 times a week for 50 minutes.    Family History  Problem Relation Age of Onset  . Heart disease Mother   . Alcohol abuse Mother   . Dementia Mother   . Alcohol abuse Father   . Colon cancer Father 65    prostate and colon- unsure of primary   . Kidney cancer Brother   . Alcohol abuse Maternal Grandfather   . Alcohol abuse Sister   . Esophageal cancer Neg Hx   . Stomach cancer Neg Hx   . Rectal cancer Neg Hx       CLARKE-PEARSON,Agata Lucente L, MD 09/08/2015, 2:15 PM

## 2015-09-26 NOTE — Anesthesia Postprocedure Evaluation (Signed)
Anesthesia Post Note  Patient: Dawn Powers  Procedure(s) Performed: Procedure(s) (LRB): ROBOTIC ASSISTED TOTAL HYSTERECTOMY WITH BILATERAL SALPINGO OOPHORECTOMY (Bilateral) SENTINEL LYMPH NODE BIOPSY (N/A)  Patient location during evaluation: PACU Anesthesia Type: General Level of consciousness: awake and alert Pain management: pain level controlled Vital Signs Assessment: post-procedure vital signs reviewed and stable Respiratory status: spontaneous breathing, nonlabored ventilation, respiratory function stable and patient connected to nasal cannula oxygen Cardiovascular status: blood pressure returned to baseline and stable Postop Assessment: no signs of nausea or vomiting Anesthetic complications: no    Last Vitals:  Filed Vitals:   09/26/15 1942 09/26/15 2042  BP: 162/51 143/62  Pulse: 72 62  Temp: 36.8 C 36.9 C  Resp: 16 16    Last Pain:  Filed Vitals:   09/26/15 2054  PainSc: [name]

## 2015-09-26 NOTE — Op Note (Addendum)
OPERATIVE NOTE 09/26/15  Surgeon: Donaciano Eva   Assistants: Dr Lahoma Crocker (an MD assistant was necessary for tissue manipulation, management of robotic instrumentation, retraction and positioning due to the complexity of the case and hospital policies).   Anesthesia: General endotracheal anesthesia  ASA Class: 3   Pre-operative Diagnosis: grade 2 endometrial cancer  Post-operative Diagnosis: same  Operation: Robotic-assisted laparoscopic hysterectomy with bilateral salpingoophorectomy, bilateral SLN biopsy  Surgeon: Donaciano Eva  Assistant Surgeon: Lahoma Crocker MD  Anesthesia: GET  Urine Output: 200  Operative Findings:  : 6 week size normal appearing uterus, bilateral tubes and ovaries, no gross extrauterine disease  Estimated Blood Loss:  <20cc      Total IV Fluids: 400 ml         Specimens: uterus, bilateral tubes and ovaries, washings, right external iliac SLN, left obturator SLN         Complications:  None; patient tolerated the procedure well.         Disposition: PACU - hemodynamically stable.  Procedure Details  The patient was seen in the Holding Room. The risks, benefits, complications, treatment options, and expected outcomes were discussed with the patient.  The patient concurred with the proposed plan, giving informed consent.  The site of surgery properly noted/marked. The patient was identified as Dawn Powers and the procedure verified as a Robotic-assisted hysterectomy with bilateral salpingo oophorectomy. A Time Out was held and the above information confirmed.  After induction of anesthesia, the patient was draped and prepped in the usual sterile manner. Pt was placed in supine position after anesthesia and draped and prepped in the usual sterile manner. The abdominal drape was placed after the CholoraPrep had been allowed to dry for 3 minutes.  Her arms were tucked to her side with all appropriate precautions.  The shoulders  were stabilized with padded shoulder blocks.  The patient was placed in the semi-lithotomy position in [place].  The perineum was prepped with Betadine.  Foley catheter was placed.  A sterile speculum was placed in the vagina.  The cervix was grasped with a single-tooth tenaculum and dilated with Kennon Rounds dilators.  The ZUMI uterine manipulator with a medium colpotomizer ring was placed without difficulty.  A pneum occluder balloon was placed over the manipulator.  A second time-out was performed.  OG tube placement was confirmed and to suction.    Procedure:  The patient was brought to the operating room where general anesthesia was administered with no complications.  The patient was placed in the dorsal lithotomy position in padded Allen stirrups.  The arms were tucked at the sides with gel pads protecting the elbows and foam protecting the hands. The patient was then prepped.  A Foley was placed to gravity.  A medium size KOH ring was used to place around the cervix after the cervix had been dilated and then a RUMI manipulator was attached in the normal manner.  The patient was then draped in the normal manner.  Next, a 5 mm skin incision was made 1 cm below the subcostal margin in the midclavicular line.  The 5 mm Optiview port and scope was used for direct entry.  Opening pressure was under 10 mm CO2.  The abdomen was insufflated and the findings were noted as above.   At this point and all points during the procedure, the patient's intra-abdominal pressure did not exceed 15 mmHg. Next, a 10 mm skin incision was made in the umbilicus and a right and  left port was placed about 10 cm lateral to the robot port on the right and left side.  A fourth arm was placed in the left lower quadrant 2 cm above and superior and medial to the anterior superior iliac spine.  All ports were placed under direct visualization.  The patient was placed in steep Trendelenburg.  Bowel was away into the upper abdomen.  The  robot was docked in the normal manner.  The right and left peritoneum were opened parallel to the IP ligament to open the retroperitoneal spaces bilaterally. The SLN mapping was performed in bilateral pelvic basins. The para rectal and paravesical spaces were opened up. Lymphatic channels were identified travelling to the following visualized sentinel lymph node's: right external iliac SLN, left obturator SLN. These SLN's were separated from their surrounding lymphatic tissue, removed and sent for permanent pathology.  The hysterectomy was started after the round ligament on the right side was incised and the retroperitoneum was entered and the pararectal space was developed.  The ureter was noted to be on the medial leaf of the broad ligament.  The peritoneum above the ureter was incised and stretched and the infundibulopelvic ligament was skeletonized, cauterized and cut.  The posterior peritoneum was taken down to the level of the KOH ring.  The anterior peritoneum was also taken down.  The bladder flap was created to the level of the KOH ring.  The uterine artery on the right side was skeletonized, cauterized and cut in the normal manner.  A similar procedure was performed on the left.  The colpotomy was made and the uterus, cervix, bilateral ovaries and tubes were amputated and delivered through the vagina.  Pedicles were inspected and excellent hemostasis was achieved.    The colpotomy at the vaginal cuff was closed with Vicryl on a CT1 needle in a running manner.  Irrigation was used and excellent hemostasis was achieved.  At this point in the procedure was completed.  Robotic instruments were removed under direct visulaization.  The robot was undocked. The 10 mm ports were closed with Vicryl on a UR-5 needle and the fascia was closed with 0 Vicryl on a UR-5 needle.  The skin was closed with 4-0 Vicryl in a subcuticular manner.  Dermabond was applied.  Sponge, lap and needle counts correct x 2.  The  patient was taken to the recovery room in stable condition.  The vagina was swabbed with  minimal bleeding noted.   All instrument and needle counts were correct x  3.   The patient was transferred to the recovery room in a stable condition.   Donaciano Eva, MD

## 2015-09-26 NOTE — Anesthesia Preprocedure Evaluation (Signed)
Anesthesia Evaluation  Patient identified by MRN, date of birth, ID band Patient awake    Reviewed: Allergy & Precautions, H&P , NPO status , Patient's Chart, lab work & pertinent test results  Airway Mallampati: II  TM Distance: >3 FB Neck ROM: full    Dental no notable dental hx. (+) Dental Advisory Given, Teeth Intact   Pulmonary neg pulmonary ROS, former smoker,    Pulmonary exam normal breath sounds clear to auscultation       Cardiovascular Exercise Tolerance: Good hypertension, Normal cardiovascular exam Rhythm:regular Rate:Normal     Neuro/Psych negative neurological ROS  negative psych ROS   GI/Hepatic negative GI ROS, Neg liver ROS,   Endo/Other  negative endocrine ROS  Renal/GU negative Renal ROS  negative genitourinary   Musculoskeletal   Abdominal   Peds  Hematology negative hematology ROS (+)   Anesthesia Other Findings   Reproductive/Obstetrics negative OB ROS                             Anesthesia Physical Anesthesia Plan  ASA: II  Anesthesia Plan: General   Post-op Pain Management:    Induction: Intravenous  Airway Management Planned: Oral ETT  Additional Equipment:   Intra-op Plan:   Post-operative Plan: Extubation in OR  Informed Consent: I have reviewed the patients History and Physical, chart, labs and discussed the procedure including the risks, benefits and alternatives for the proposed anesthesia with the patient or authorized representative who has indicated his/her understanding and acceptance.   Dental Advisory Given  Plan Discussed with: CRNA and Surgeon  Anesthesia Plan Comments:         Anesthesia Quick Evaluation

## 2015-09-26 NOTE — Transfer of Care (Signed)
Immediate Anesthesia Transfer of Care Note  Patient: Dawn Powers  Procedure(s) Performed: Procedure(s): ROBOTIC ASSISTED TOTAL HYSTERECTOMY WITH BILATERAL SALPINGO OOPHORECTOMY (Bilateral) SENTINEL LYMPH NODE BIOPSY (N/A)  Patient Location: PACU  Anesthesia Type:General  Level of Consciousness: awake  Airway & Oxygen Therapy: Patient Spontanous Breathing and Patient connected to face mask oxygen  Post-op Assessment: Report given to RN and Post -op Vital signs reviewed and stable  Post vital signs: Reviewed and stable  Last Vitals:  Filed Vitals:   09/26/15 1030  BP: 140/77  Pulse: 92  Temp: 36.7 C  Resp: 18    Complications: No apparent anesthesia complications

## 2015-09-26 NOTE — Anesthesia Procedure Notes (Signed)
Procedure Name: Intubation Date/Time: 09/26/2015 2:32 PM Performed by: Danley Danker L Patient Re-evaluated:Patient Re-evaluated prior to inductionOxygen Delivery Method: Circle system utilized Preoxygenation: Pre-oxygenation with 100% oxygen Intubation Type: IV induction Ventilation: Mask ventilation without difficulty and Oral airway inserted - appropriate to patient size Laryngoscope Size: Sabra Heck and 2 Grade View: Grade I Tube type: Oral Tube size: 7.5 mm Number of attempts: 1 Airway Equipment and Method: Stylet Placement Confirmation: ETT inserted through vocal cords under direct vision,  positive ETCO2 and breath sounds checked- equal and bilateral Secured at: 21 cm Tube secured with: Tape Dental Injury: Teeth and Oropharynx as per pre-operative assessment

## 2015-09-27 ENCOUNTER — Encounter (HOSPITAL_COMMUNITY): Payer: Self-pay | Admitting: Gynecologic Oncology

## 2015-09-27 DIAGNOSIS — C541 Malignant neoplasm of endometrium: Secondary | ICD-10-CM | POA: Diagnosis not present

## 2015-09-27 LAB — BASIC METABOLIC PANEL
Anion gap: 13 (ref 5–15)
BUN: 10 mg/dL (ref 6–20)
CO2: 26 mmol/L (ref 22–32)
Calcium: 9.7 mg/dL (ref 8.9–10.3)
Chloride: 101 mmol/L (ref 101–111)
Creatinine, Ser: 0.81 mg/dL (ref 0.44–1.00)
GFR calc Af Amer: >60 mL/min (ref >60)
GFR calc non Af Amer: >60 mL/min (ref >60)
Glucose, Bld: 143 mg/dL — ABNORMAL HIGH (ref 65–99)
Potassium: 4.4 mmol/L (ref 3.5–5.1)
Sodium: 140 mmol/L (ref 135–145)

## 2015-09-27 LAB — CBC
HCT: 40.2 % (ref 36.0–46.0)
Hemoglobin: 13.6 g/dL (ref 12.0–15.0)
MCH: 31.8 pg (ref 26.0–34.0)
MCHC: 33.8 g/dL (ref 30.0–36.0)
MCV: 93.9 fL (ref 78.0–100.0)
Platelets: 241 10*3/uL (ref 150–400)
RBC: 4.28 MIL/uL (ref 3.87–5.11)
RDW: 12.3 % (ref 11.5–15.5)
WBC: 6.7 10*3/uL (ref 4.0–10.5)

## 2015-09-27 MED ORDER — HYDROMORPHONE HCL 2 MG PO TABS: 2.0000 mg | ORAL_TABLET | Freq: Four times a day (QID) | ORAL | Status: DC | PRN

## 2015-09-27 MED ORDER — HYDROCODONE-ACETAMINOPHEN 5-325 MG PO TABS: 1.0000 | ORAL_TABLET | ORAL | Status: DC | PRN

## 2015-09-27 NOTE — Discharge Summary (Signed)
Physician Discharge Summary  Patient ID: Dawn Powers MRN: [id number] DOB/AGE: [birth year] 71 y.o.  Admit date: 09/26/2015 Discharge date: 09/27/2015  Admission Diagnoses: Endometrial cancer [facility])  Discharge Diagnoses:  Principal Problem:   Endometrial cancer [facility] [place])   Discharged Condition:  The patient is in good condition and stable for discharge.    Hospital Course: On 09/26/2015, the patient underwent the following: Procedure(s): ROBOTIC ASSISTED TOTAL HYSTERECTOMY WITH BILATERAL SALPINGO OOPHORECTOMY SENTINEL LYMPH NODE BIOPSY.  The postoperative course was uneventful.  She was discharged to home on postoperative day 1 tolerating a regular diet, voiding without difficulty, passing flatus, with minimal pain.  Consults: None  Significant Diagnostic Studies: None  Treatments: surgery: see above  Discharge Exam: Blood pressure 161/73, pulse 62, temperature 97.8 F (36.6 C), temperature source Oral, resp. rate 16, height 5\' 4"  (1.626 m), weight 129 lb (58.514 kg), SpO2 100 %. General appearance: alert, cooperative and no distress Resp: clear to auscultation bilaterally Cardio: regular rate and rhythm, S1, S2 normal, no murmur, click, rub or gallop GI: soft, non-tender; bowel sounds normal; no masses,  no organomegaly Extremities: extremities normal, atraumatic, no cyanosis or edema Incision/Wound: Lap sites with dermabond without erythema or drainage, mild ecchymosis noted around the umbilicus incision and left upper abd incision.  Disposition: Home      Discharge Instructions    Call MD for:  difficulty breathing, headache or visual disturbances    Complete by:  As directed      Call MD for:  extreme fatigue    Complete by:  As directed      Call MD for:  hives    Complete by:  As directed      Call MD for:  persistant dizziness or light-headedness    Complete by:  As directed      Call MD for:  persistant nausea and vomiting    Complete by:  As directed      Call MD  for:  redness, tenderness, or signs of infection (pain, swelling, redness, odor or green/yellow discharge around incision site)    Complete by:  As directed      Call MD for:  severe uncontrolled pain    Complete by:  As directed      Call MD for:  temperature >100.4    Complete by:  As directed      Diet - low sodium heart healthy    Complete by:  As directed      Driving Restrictions    Complete by:  As directed   No driving for 1 week.  Do not take narcotics and drive.     Increase activity slowly    Complete by:  As directed      Lifting restrictions    Complete by:  As directed   No lifting greater than 10 lbs.     Sexual Activity Restrictions    Complete by:  As directed   No sexual activity, nothing in the vagina, for 6 weeks.            Medication List    TAKE these medications        fluticasone 0.05 % cream  Commonly known as:  CUTIVATE  Apply 1 application topically as directed. Takes for 1 week then stops for 1 week.  Apply to ears.     HYDROcodone-acetaminophen 5-325 MG tablet  Commonly known as:  NORCO  Take 1-2 tablets by mouth every 4 (four) hours as needed for moderate pain or  severe pain.     HYDROmorphone 2 MG tablet  Commonly known as:  DILAUDID  Take 1 tablet (2 mg total) by mouth every 6 (six) hours as needed for severe pain (Do not take with Hydrocodone.APAP).     magnesium oxide 400 MG tablet  Commonly known as:  MAG-OX  Take 400 mg by mouth daily.     multivitamin-lutein Caps capsule  Take 1 capsule by mouth daily.     SYSTANE BALANCE OP  Apply 1 drop to eye daily as needed (dry eyes).     traZODone 50 MG tablet  Commonly known as:  DESYREL  Take 0.5-1 tablets (25-50 mg total) by mouth at bedtime as needed for sleep.       Follow-up Information    Follow up with Donaciano Eva, MD On 10/18/2015.   Specialty:  Obstetrics and Gynecology   Why:  at 2pm at the [facility] for post-op follow up   Contact information:   [address] [place] [address] [phone]       Greater than thirty minutes were spend for face to face discharge instructions and discharge orders/summary in EPIC.   Signed: Laurissa Cowper DEAL 09/27/2015, 9:34 AM

## 2015-09-27 NOTE — Discharge Instructions (Signed)
09/27/2015  Return to work: 4-6 weeks if applicable  Activity: 1. Be up and out of the bed during the day.  Take a nap if needed.  You may walk up steps but be careful and use the hand rail.  Stair climbing will tire you more than you think, you may need to stop part way and rest.   2. No lifting or straining for 6 weeks.  3. No driving for 1 week(s).  Do not drive if you are taking narcotic pain medicine.  4. Shower daily.  Use soap and water on your incision and pat dry; don't rub.  No tub baths until cleared by your surgeon.   5. No sexual activity and nothing in the vagina for 6 weeks.  6. You may experience a small amount of clear drainage from your incisions, which is normal.  If the drainage persists or increases, please call the office.   Diet: 1. Low sodium Heart Healthy Diet is recommended.  2. It is safe to use a laxative, such as Miralax or Colace, if you have difficulty moving your bowels.   Wound Care: 1. Keep clean and dry.  Shower daily.  Reasons to call the Doctor:  Fever - Oral temperature greater than 100.4 degrees Fahrenheit  Foul-smelling vaginal discharge  Difficulty urinating  Nausea and vomiting  Increased pain at the site of the incision that is unrelieved with pain medicine.  Difficulty breathing with or without chest pain  New calf pain especially if only on one side  Sudden, continuing increased vaginal bleeding with or without clots.   Contacts: For questions or concerns you should contact:  Dr. Everitt Amber at [phone]  Joylene John, NP at [phone]  After Hours: call [phone] and have the GYN Oncologist paged/contacted  Abdominal Hysterectomy, Care After These instructions give you information on caring for yourself after your procedure. Your doctor may also give you more specific instructions. Call your doctor if you have any problems or questions after your procedure.  HOME CARE It takes 4-6 weeks to recover from this  surgery. Follow all of your doctor's instructions.   Only take medicines as told by your doctor.  Change your bandage as told by your doctor.  Return to your doctor to have your stitches taken out.  Take showers for 2-3 weeks. Ask your doctor when it is okay to shower.  Do not douche, use tampons, or have sex (intercourse) for at least 6 weeks or as told.  Follow your doctor's advice about exercise, lifting objects, driving, and general activities.  Get plenty of rest and sleep.  Do not lift anything heavier than a gallon of milk (about 10 pounds [4.5 kilograms]) for the first month after surgery.  Get back to your normal diet as told by your doctor.  Do not drink alcohol until your doctor says it is okay.  Take a medicine to help you poop (laxative) as told by your doctor.  Eating foods high in fiber may help you poop. Eat a lot of raw fruits and vegetables, whole grains, and beans.  Drink enough fluids to keep your pee (urine) clear or pale yellow.  Have someone help you at home for 1-2 weeks after your surgery.  Keep follow-up doctor visits as told. GET HELP IF:  You have chills or fever.  You have puffiness, redness, or pain in area of the cut (incision).  You have yellowish-white fluid (pus) coming from the cut.  You have a bad smell coming from  the cut or bandage.  Your cut pulls apart.  You feel dizzy or light-headed.  You have pain or bleeding when you pee.  You keep having watery poop (diarrhea).  You keep feeling sick to your stomach (nauseous) or keep throwing up (vomiting).  You have fluid (discharge) coming from your vagina.  You have a rash.  You have a reaction to your medicine.  You need stronger pain medicine. GET HELP RIGHT AWAY IF:   You have a fever and your symptoms suddenly get worse.  You have bad belly (abdominal) pain.  You have chest pain.  You are short of breath.  You pass out (faint).  You have pain, puffiness, or  redness of your leg.  You bleed a lot from your vagina and notice clumps of tissue (clots). MAKE SURE YOU:   Understand these instructions.  Will watch your condition.  Will get help right away if you are not doing well or get worse.   This information is not intended to replace advice given to you by your health care provider. Make sure you discuss any questions you have with your health care provider.   Document Released: 06/25/2008 Document Revised: 09/21/2013 Document Reviewed: 07/09/2013 Elsevier Interactive Patient Education 2016 Else[employer]  Acetaminophen; Hydrocodone tablets or capsules What is this medicine? ACETAMINOPHEN; HYDROCODONE (a set a MEE noe fen; hye droe KOE done) is a pain reliever. It is used to treat moderate to severe pain. This medicine may be used for other purposes; ask your health care provider or pharmacist if you have questions. What should I tell my health care provider before I take this medicine? They need to know if you have any of these conditions: -brain tumor -Crohn's disease, inflammatory bowel disease, or ulcerative colitis -drug abuse or addiction -head injury -heart or circulation problems -if you often drink alcohol -kidney disease or problems going to the bathroom -liver disease -lung disease, asthma, or breathing problems -an unusual or allergic reaction to acetaminophen, hydrocodone, other opioid analgesics, other medicines, foods, dyes, or preservatives -pregnant or trying to get pregnant -breast-feeding How should I use this medicine? Take this medicine by mouth. Swallow it with a full glass of water. Follow the directions on the prescription label. If the medicine upsets your stomach, take the medicine with food or milk. Do not take more than you are told to take. Talk to your pediatrician regarding the use of this medicine in children. This medicine is not approved for use in children. Patients over 65 years may have a stronger  reaction and need a smaller dose. Overdosage: If you think you have taken too much of this medicine contact a poison control center or emergency room at once. NOTE: This medicine is only for you. Do not share this medicine with others. What if I miss a dose? If you miss a dose, take it as soon as you can. If it is almost time for your next dose, take only that dose. Do not take double or extra doses. What may interact with this medicine? -alcohol -antihistamines -isoniazid -medicines for depression, anxiety, or psychotic disturbances -medicines for sleep -muscle relaxants -naltrexone -narcotic medicines (opiates) for pain -phenobarbital -ritonavir -tramadol This list may not describe all possible interactions. Give your health care provider a list of all the medicines, herbs, non-prescription drugs, or dietary supplements you use. Also tell them if you smoke, drink alcohol, or use illegal drugs. Some items may interact with your medicine. What should I watch for while using  this medicine? Tell your doctor or health care professional if your pain does not go away, if it gets worse, or if you have new or a different type of pain. You may develop tolerance to the medicine. Tolerance means that you will need a higher dose of the medicine for pain relief. Tolerance is normal and is expected if you take the medicine for a long time. Do not suddenly stop taking your medicine because you may develop a severe reaction. Your body becomes used to the medicine. This does NOT mean you are addicted. Addiction is a behavior related to getting and using a drug for a non-medical reason. If you have pain, you have a medical reason to take pain medicine. Your doctor will tell you how much medicine to take. If your doctor wants you to stop the medicine, the dose will be slowly lowered over time to avoid any side effects. You may get drowsy or dizzy when you first start taking the medicine or change doses. Do not  drive, use machinery, or do anything that may be dangerous until you know how the medicine affects you. Stand or sit up slowly. There are different types of narcotic medicines (opiates) for pain. If you take more than one type at the same time, you may have more side effects. Give your health care provider a list of all medicines you use. Your doctor will tell you how much medicine to take. Do not take more medicine than directed. Call emergency for help if you have problems breathing. The medicine will cause constipation. Try to have a bowel movement at least every 2 to 3 days. If you do not have a bowel movement for 3 days, call your doctor or health care professional. Too much acetaminophen can be very dangerous. Do not take Tylenol (acetaminophen) or medicines that contain acetaminophen with this medicine. Many non-prescription medicines contain acetaminophen. Always read the labels carefully. What side effects may I notice from receiving this medicine? Side effects that you should report to your doctor or health care professional as soon as possible: -allergic reactions like skin rash, itching or hives, swelling of the face, lips, or tongue -breathing problems -confusion -feeling faint or lightheaded, falls -stomach pain -yellowing of the eyes or skin Side effects that usually do not require medical attention (report to your doctor or health care professional if they continue or are bothersome): -nausea, vomiting -stomach upset This list may not describe all possible side effects. Call your doctor for medical advice about side effects. You may report side effects to FDA at 1-800-FDA-1088. Where should I keep my medicine? Keep out of the reach of children. This medicine can be abused. Keep your medicine in a safe place to protect it from theft. Do not share this medicine with anyone. Selling or giving away this medicine is dangerous and against the law. This medicine may cause accidental  overdose and death if it taken by other adults, children, or pets. Mix any unused medicine with a substance like cat litter or coffee grounds. Then throw the medicine away in a sealed container like a sealed bag or a coffee can with a lid. Do not use the medicine after the expiration date. Store at room temperature between 15 and 30 degrees C (59 and 86 degrees F). NOTE: This sheet is a summary. It may not cover all possible information. If you have questions about this medicine, talk to your doctor, pharmacist, or health care provider.    2016, Elsevier/Gold Standard. (  2014-08-17 15:29:20) ° ° °

## 2015-09-29 ENCOUNTER — Other Ambulatory Visit: Payer: Self-pay | Admitting: Gynecologic Oncology

## 2015-09-29 ENCOUNTER — Telehealth: Payer: Self-pay

## 2015-09-29 ENCOUNTER — Telehealth: Payer: Self-pay | Admitting: Gynecologic Oncology

## 2015-09-29 DIAGNOSIS — C541 Malignant neoplasm of endometrium: Secondary | ICD-10-CM

## 2015-09-29 NOTE — Telephone Encounter (Signed)
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 Recommend adjuvant vaginal radiation for high/intermediate risk factors to reduce risk of recurrence from 12-4%. Will schedule her to see Dr Sondra Come for consideration of vaginal brachytherapy.  Patient states she is otherwise doing well.  Donaciano Eva, MD

## 2015-09-29 NOTE — Telephone Encounter (Signed)
Orders received from [place] to contact the patient to update with recommendations for consultation with Dr Sondra Come for radiation per Dr Everitt Amber . Patient contacted and updated , patient states understanding and patient agreed to consultation with Dr Sondra Come on October 11, 2015 at 7:30 AM . Patient denies further questions at this time , Dr Denman George aware that the patient has scheduled consultation.

## 2015-10-03 ENCOUNTER — Telehealth: Payer: Self-pay | Admitting: Gynecologic Oncology

## 2015-10-03 ENCOUNTER — Ambulatory Visit: Payer: Medicare Other | Admitting: Emergency Medicine

## 2015-10-03 NOTE — Telephone Encounter (Signed)
Returned call to patient.  Left message and advised her to call the office.

## 2015-10-03 NOTE — Telephone Encounter (Signed)
Patient returned call to the office.  Patient stating today is her best day yet and she has "real" clothes on today.  Patient had significant back pain post-op but it has resolved.  Asking questions about radiation and her pathology.  Asking if she is ok to begin gentle exercises.  All questions answered.  Advised to call for any questions or concerns.  On the phone for 15 minutes.

## 2015-10-06 NOTE — Progress Notes (Signed)
GYN Location of Tumor / Histology: IB grade 2 endometrial cancer  Dawn Powers presented with symptoms of: several weeks of spotting before seeing Dr. Phineas Douglas on 08/18/2015.  Biopsies revealed:   09/26/15 Diagnosis 1. Lymph node, sentinel, biopsy, Right External iliac ONE BENIGN LYMPH NODE (0/1) 2. Lymph node, sentinel, biopsy, Left External iliac ONE BENIGN LYMPH NODE (0/1) 3. Uterus +/- tubes/ovaries, neoplastic, Bilateral Tubes and Ovaries ENDOMETRIOID ADENOCARCINOMA, FIGO GRADE 2 THE CARCINOMA INVADES THE OUTER HALF OF THE MYOMETRIUM (T1B) NO LYMPHOVASCULAR INFILTRATION IDENTIFIED MARGINS OF RESECTION ARE NEGATIVE FOR TUMOR CERVIX: HISTOLOGICAL UNREMARKABLE BILATERAL OVARIES AND FALLOPIAN TUBES: NO PATHOLOGICAL ALTERATIONS  Diagnosis PERITONEAL WASHING (SPECIMEN 1 OF 1 COLLECTED 09/26/15): REACTIVE MESOTHELIAL CELLS PRESENT.  Past/Anticipated interventions by Gyn/Onc surgery, if any: 09/26/15 - Procedure: ROBOTIC ASSISTED TOTAL HYSTERECTOMY WITH BILATERAL SALPINGO OOPHORECTOMY;  Surgeon: Everitt Amber, MD;  Location: [facility];  Service: Gynecology;  Laterality: Bilateral; andProcedure: SENTINEL LYMPH NODE BIOPSY;  Surgeon: Everitt Amber, MD;  Location: [facility];  Service: Gynecology;  Laterality: N/A;   Past/Anticipated interventions by medical oncology, if any: no  Weight changes, if any: no  Bowel/Bladder complaints, if any: no.  Denies having any bladder issues.  Reports having constipation from hydrocodone.  Last bm this morning.  Nausea/Vomiting, if any: no  Pain issues, if any:  Yes - has chronic mid back pain/spasms.  SAFETY ISSUES:  Prior radiation? no  Pacemaker/ICD? no  Possible current pregnancy? no  Is the patient on methotrexate? no  Current Complaints / other details:  Dr. Denman George is recommending vaginal brachytherapy.  Patient is here with her husband.  She has 2 children.  BP 129/48 mmHg  Pulse 56  Temp(Src) 98.5 F (36.9 C) (Oral)  Resp 16  Ht 5\' 4"   (1.626 m)  Wt 134 lb 1.6 oz (60.827 kg)  BMI 23.01 kg/m2  SpO2 100%

## 2015-10-09 ENCOUNTER — Telehealth: Payer: Self-pay | Admitting: Gynecologic Oncology

## 2015-10-09 NOTE — Telephone Encounter (Signed)
"  Now I have researched and I know more."  Asking about her pathology and radiation.  Reporting light pink spotting and advised this is a normal finding after surgery.  Asking about exercises.  All questions answered.  Phone call lasted 20 min.  Patient advised to call for any questions or concerns.

## 2015-10-11 ENCOUNTER — Ambulatory Visit
Admission: RE | Admit: 2015-10-11 | Discharge: 2015-10-11 | Disposition: A | Discharge status: Home / Self Care | Payer: Medicare Other | Source: Ambulatory Visit | Attending: Radiation Oncology | Admitting: Radiation Oncology

## 2015-10-11 ENCOUNTER — Encounter: Payer: Self-pay | Admitting: Radiation Oncology

## 2015-10-11 VITALS — BP 129/48 | HR 56 | Temp 98.5°F | Resp 16 | Ht 64.0 in | Wt 134.1 lb

## 2015-10-11 DIAGNOSIS — Z90722 Acquired absence of ovaries, bilateral: Secondary | ICD-10-CM | POA: Diagnosis not present

## 2015-10-11 DIAGNOSIS — Z90710 Acquired absence of both cervix and uterus: Secondary | ICD-10-CM | POA: Diagnosis not present

## 2015-10-11 DIAGNOSIS — E785 Hyperlipidemia, unspecified: Secondary | ICD-10-CM | POA: Diagnosis not present

## 2015-10-11 DIAGNOSIS — Z885 Allergy status to narcotic agent status: Secondary | ICD-10-CM | POA: Diagnosis not present

## 2015-10-11 DIAGNOSIS — C541 Malignant neoplasm of endometrium: Secondary | ICD-10-CM | POA: Diagnosis present

## 2015-10-11 DIAGNOSIS — Z87891 Personal history of nicotine dependence: Secondary | ICD-10-CM | POA: Diagnosis not present

## 2015-10-11 DIAGNOSIS — Z79899 Other long term (current) drug therapy: Secondary | ICD-10-CM | POA: Insufficient documentation

## 2015-10-11 NOTE — Progress Notes (Signed)
Please see the Nurse Progress Note in the MD Initial Consult Encounter for this patient. 

## 2015-10-11 NOTE — Addendum Note (Signed)
Encounter addended by: Jacqulyn Liner, RN on: 10/11/2015  2:25 PM<BR>     Documentation filed: Charges VN

## 2015-10-11 NOTE — Progress Notes (Signed)
Radiation Oncology         [phone] ________________________________  Initial Outpatient Consultation  Name: Dawn Powers MRN: [id number]  Date: 10/11/2015  DOB: [birth year]  [id number], Dawn Sayre, MD  Everitt Amber, MD   REFERRING PHYSICIAN: Everitt Amber, MD  DIAGNOSIS: Stage IB grade 2 endometrial cancer.  HISTORY OF PRESENT ILLNESS::Dawn Powers is a 70 y.o. female who presents to the clinic with grade 2 endometrial cancer. She is seen out of the courtesy of Dr. Denman George for an opinion concerning postop radiation therapy.  The patient presented with several weeks of spotting before seeing Dr. Willis Modena on 08/18/2015. An endometrial biopsy was obtained and revealed a grade 2 endometrial carcinoma. She had a robotic-assisted hysterectomy and bilateral salpingo-oophorectomy and pelvic and aortic lymphadenectomy on 09/26/2015, performed by Dr. Denman George. Pathology from this surgery revealed a endometrioid adenocarcinoma, FIGO grade 2. The tumor did invade the outer half of the myometrium but no spread outside the uterus. 2 benign lymph nodes were recovered from the pelvis area. Tumor extended to a depth of 1.2 cm where myometrium measured 1.5 cm in thickness. The tumor measured 2.2 cm in maximal dimension. No lymphovascular space invasion was noted.  She states that she feels good since the surgery. She denies urinary problems or discomfort. She still has some light spotting after the surgery.   PREVIOUS RADIATION THERAPY: No  PAST MEDICAL HISTORY:  has a past medical history of Hyperlipidemia; Arthritis; Heart murmur; Pain; and Cancer ([place]).    PAST SURGICAL HISTORY: Past Surgical History  Procedure Laterality Date  . Fracture surgery Right     ankle  . Ganglion cyst excision    . Colonoscopy    . Robotic assisted total hysterectomy with bilateral salpingo oopherectomy Bilateral 09/26/2015    Procedure: ROBOTIC ASSISTED TOTAL HYSTERECTOMY WITH BILATERAL SALPINGO OOPHORECTOMY;  Surgeon: Everitt Amber, MD;  Location: [facility];  Service: Gynecology;  Laterality: Bilateral;  . Lymph node biopsy N/A 09/26/2015    Procedure: SENTINEL LYMPH NODE BIOPSY;  Surgeon: Everitt Amber, MD;  Location: [facility];  Service: Gynecology;  Laterality: N/A;    FAMILY HISTORY: family history includes Alcohol abuse in her father, maternal grandfather, mother, and sister; Dementia in her mother; Heart disease in her mother; Kidney cancer in her brother; Prostate cancer (age of onset: 31) in her father. There is no history of Esophageal cancer, Stomach cancer, or Rectal cancer.  SOCIAL HISTORY:  reports that she quit smoking about 17 years ago. Her smoking use included Cigarettes. She quit after 4 years of use. She has never used smokeless tobacco. She reports that she drinks about 1.8 oz of alcohol per week. She reports that she does not use illicit drugs.  ALLERGIES: Morphine and related  MEDICATIONS:  Current Outpatient Prescriptions  Medication Sig Dispense Refill  . fluticasone (CUTIVATE) 0.05 % cream Apply 1 application topically as directed. Takes for 1 week then stops for 1 week.  Apply to ears.    Marland Kitchen HYDROcodone-acetaminophen (NORCO) 5-325 MG tablet Take 1-2 tablets by mouth every 4 (four) hours as needed for moderate pain or severe pain. 60 tablet 0  . magnesium oxide (MAG-OX) 400 MG tablet Take 400 mg by mouth daily.    . multivitamin-lutein (OCUVITE-LUTEIN) CAPS capsule Take 1 capsule by mouth daily.    Marland Kitchen Propylene Glycol (SYSTANE BALANCE OP) Apply 1 drop to eye daily as needed (dry eyes).     . traZODone (DESYREL) 50 MG tablet Take 0.5-1 tablets (25-50 mg total) by  mouth at bedtime as needed for sleep. 30 tablet 3  . HYDROmorphone (DILAUDID) 2 MG tablet Take 1 tablet (2 mg total) by mouth every 6 (six) hours as needed for severe pain (Do not take with Hydrocodone.APAP). (Patient not taking: Reported on 10/11/2015) 5 tablet 0   No current facility-administered medications for this encounter.    REVIEW OF  SYSTEMS:  A 15 point review of systems is documented in the electronic medical record. This was obtained by the nursing staff. However, I reviewed this with the patient to discuss relevant findings and make appropriate changes.  Pertinent items are noted in HPI.   PHYSICAL EXAM:  height is 5\' 4"  (1.626 m) and weight is 134 lb 1.6 oz (60.827 kg). Her oral temperature is 98.5 F (36.9 C). Her blood pressure is 129/48 and her pulse is 56. Her respiration is 16 and oxygen saturation is 100%.   BP 129/48 mmHg  Pulse 56  Temp(Src) 98.5 F (36.9 C) (Oral)  Resp 16  Ht 5\' 4"  (1.626 m)  Wt 134 lb 1.6 oz (60.827 kg)  BMI 23.01 kg/m2  SpO2 100%   General: Alert and oriented, in no acute distress HEENT: Head is normocephalic. Extraocular movements are intact. Oropharynx is clear. Neck: Neck is supple, no palpable cervical or supraclavicular lymphadenopathy. Heart: Regular in rate and rhythm with no murmurs, rubs, or gallops. Chest: Clear to auscultation bilaterally, with no rhonchi, wheezes, or rales. Abdomen: Soft, nontender, nondistended, with no rigidity or guarding. Extremities: No cyanosis or edema. Lymphatics: see Neck Exam Skin: No concerning lesions. Musculoskeletal: symmetric strength and muscle tone throughout. Neurologic: Cranial nerves II through XII are grossly intact. No obvious focalities. Speech is fluent. Coordination is intact. Psychiatric: Judgment and insight are intact. Affect is appropriate  She has 5 small scars along the abdomen from laparoscopic procedure two weeks ago. She is healing well. Some bruising noted in these areas. Pelvic examination deferred in light of recent surgery.  ECOG = 1    LABORATORY DATA:  Lab Results  Component Value Date   WBC 6.7 09/27/2015   HGB 13.6 09/27/2015   HCT 40.2 09/27/2015   MCV 93.9 09/27/2015   PLT 241 09/27/2015   NEUTROABS 2.1 06/27/2015   Lab Results  Component Value Date   NA 140 09/27/2015   K 4.4 09/27/2015   CL 101  09/27/2015   CO2 26 09/27/2015   GLUCOSE 143* 09/27/2015   CREATININE 0.81 09/27/2015   CALCIUM 9.7 09/27/2015      RADIOGRAPHY: No results found.    IMPRESSION: Dawn Powers is a 70 yo woman with IB grade 2 endometrial cancer. Pathology shows high/intermediate risk factors for recurrence She is a good candidate for vaginal vault radiation therapy. I discussed treatment course side effects and potential long-term toxicities of vaginal brachytherapy with the patient and her husband. She appears to understand and wishes to proceed with planned course of treatment.  PLAN: She has an appointment with Dr. Denman George next week.  She will be scheduled for a planning appointment in February.  I anticipate 5 high-dose rate treatments directed at the vaginal cuff region    ------------------------------------------------  Blair Promise, PhD, MD         This document serves as a record of services personally performed by Gery Pray, MD. It was created on his behalf by Lendon Collar, a trained medical scribe. The creation of this record is based on the scribe's personal observations and the provider's statements to them. This  document has been checked and approved by the attending provider.

## 2015-10-16 ENCOUNTER — Telehealth: Payer: Self-pay | Admitting: *Deleted

## 2015-10-16 NOTE — Telephone Encounter (Signed)
CALLED PATIENT TO INFORM OF NEW HDR VAG. CUFF CASE, SPOKE WITH PATIENT AND SHE IS AWARE OF THESE APPTS. 

## 2015-10-17 ENCOUNTER — Telehealth: Payer: Self-pay | Admitting: Oncology

## 2015-10-17 NOTE — Telephone Encounter (Signed)
Dawn Powers called and said she has a Engineer, drilling for work scheduled on 2/15 - the same day as her first [place] treatment.  She asked if she will feel well enough to go to the luncheon.  Advised her that she will be OK to attend her luncheon.  Dawn Powers verbalized agreement.

## 2015-10-18 ENCOUNTER — Ambulatory Visit: Payer: Medicare Other | Attending: Gynecologic Oncology | Admitting: Gynecologic Oncology

## 2015-10-18 ENCOUNTER — Encounter: Payer: Self-pay | Admitting: Gynecologic Oncology

## 2015-10-18 VITALS — BP 153/69 | HR 86 | Temp 97.5°F | Resp 18 | Ht 64.0 in | Wt 130.9 lb

## 2015-10-18 DIAGNOSIS — C541 Malignant neoplasm of endometrium: Secondary | ICD-10-CM | POA: Insufficient documentation

## 2015-10-18 NOTE — Patient Instructions (Signed)
Plan to proceed with radiation and follow up with Dr. Denman George after completion of radiation.  Please call for any questions or concerns.

## 2015-10-18 NOTE — Progress Notes (Signed)
FOLLOW UP VISIT  Assessment:    70 y.o. 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   S/p robotic hysterectomy, BSO, sentinel lymph node biopsy on 09/26/15. negative LVSI, 80% myometrial invasion, negative pelvic washings and negative sentinel lymph nodes.   Plan: 1) Pathology reports reviewed today 2) Treatment counseling - the patient is at high intermediate risk for recurrence, and according to NCCN guidelines should be offered vaginal brachytherapy to reduce her risk for local recurrence. We discussed this and the limitations of radiation therapy, and potential for recurrence, and need for ongoing surveillance after therapy. She was given the opportunity to ask questions, which were answered to her satisfaction, and she is agreement with the above mentioned plan of care.  3)  Return to clinic after completing radiation after which time we will begin 3 monthly surveillance visits.  HPI:  Dawn Powers is a 70 y.o. year old initially seen in consultation on 09/08/15 referred by Dr Meissinger for grade 2 endometrial cancer.  She then underwent a robotic hysterectomy, BSO and bilateral SLN biopsy on [id number] without complications.  Her postoperative course was uncomplicated.  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  She is seen today for a postoperative check and to discuss her pathology results and treatment plan.  Since discharge from the hospital, she is feeling well.  She has improving appetite, normal bowel and bladder function, and pain controlled with minimal PO medication. She has no other complaints today.  Past Medical History  Diagnosis Date  . Hyperlipidemia   . Arthritis   . Heart murmur   . Pain     lower back -hx "DDD"  . Cancer [facility])     Endometrial    Past Surgical History  Procedure Laterality Date   . Fracture surgery Right     ankle  . Ganglion cyst excision    . Colonoscopy    . Robotic assisted total hysterectomy with bilateral salpingo oopherectomy Bilateral 09/26/2015    Procedure: ROBOTIC ASSISTED TOTAL HYSTERECTOMY WITH BILATERAL SALPINGO OOPHORECTOMY;  Surgeon: Everitt Amber, MD;  Location: [facility];  Service: Gynecology;  Laterality: Bilateral;  . Lymph node biopsy N/A 09/26/2015    Procedure: SENTINEL LYMPH NODE BIOPSY;  Surgeon: Everitt Amber, MD;  Location: [facility];  Service: Gynecology;  Laterality: N/A;   Family History  Problem Relation Age of Onset  . Heart disease Mother   . Alcohol abuse Mother     breast cancer - questionable   . Dementia Mother   . Alcohol abuse Father   . Prostate cancer Father 7    prostate and colon- unsure of primary   . Kidney cancer Brother   . Alcohol abuse Maternal Grandfather   . Alcohol abuse Sister   . Esophageal cancer Neg Hx   . Stomach cancer Neg Hx   . Rectal cancer Neg Hx    Social History   Social History  . Marital Status: Married    Spouse Name: N/A  . Number of Children: 2  . Years of Education: N/A   Occupational History  . Not on file.   Social History Main Topics  . Smoking status: Former Smoker -- 4 years    Types: Cigarettes    Quit date: 09/30/1998  . Smokeless tobacco: Never Used  . Alcohol Use: 1.8 oz/week    3 Standard drinks or  equivalent per week     Comment: [employer] - daily  . Drug Use: No  . Sexual Activity:    Partners: Female    Museum/gallery curator: None   Other Topics Concern  . Not on file   Social History Narrative   Married. Education: college and Other. Exercise 4 times a week for 50 minutes.   Current Outpatient Prescriptions on File Prior to Visit  Medication Sig Dispense Refill  . fluticasone (CUTIVATE) 0.05 % cream Apply 1 application topically as directed. Takes for 1 week then stops for 1 week.  Apply to ears.    Marland Kitchen HYDROcodone-acetaminophen (NORCO) 5-325 MG tablet  Take 1-2 tablets by mouth every 4 (four) hours as needed for moderate pain or severe pain. 60 tablet 0  . magnesium oxide (MAG-OX) 400 MG tablet Take 400 mg by mouth daily.    . multivitamin-lutein (OCUVITE-LUTEIN) CAPS capsule Take 1 capsule by mouth daily.    Marland Kitchen Propylene Glycol (SYSTANE BALANCE OP) Apply 1 drop to eye daily as needed (dry eyes).     . traZODone (DESYREL) 50 MG tablet Take 0.5-1 tablets (25-50 mg total) by mouth at bedtime as needed for sleep. 30 tablet 3  . HYDROmorphone (DILAUDID) 2 MG tablet Take 1 tablet (2 mg total) by mouth every 6 (six) hours as needed for severe pain (Do not take with Hydrocodone.APAP). (Patient not taking: Reported on 10/11/2015) 5 tablet 0   No current facility-administered medications on file prior to visit.   Allergies  Allergen Reactions  . Morphine And Related Hives      Review of systems: Constitutional:  She has no weight gain or weight loss. She has no fever or chills. Eyes: No blurred vision Ears, Nose, Mouth, Throat: No dizziness, headaches or changes in hearing. No mouth sores. Cardiovascular: No chest pain, palpitations or edema. Respiratory:  No shortness of breath, wheezing or cough Gastrointestinal: She has normal bowel movements without diarrhea or constipation. She denies any nausea or vomiting. She denies blood in her stool or heart burn. Genitourinary:  She denies pelvic pain, pelvic pressure or changes in her urinary function. She has no hematuria, dysuria, or incontinence. She has no irregular vaginal bleeding or vaginal discharge Musculoskeletal: Denies muscle weakness or joint pains.  Skin:  She has no skin changes, rashes or itching Neurological:  Denies dizziness or headaches. No neuropathy, no numbness or tingling. Psychiatric:  She denies depression or anxiety. Hematologic/Lymphatic:   No easy bruising or bleeding   Physical Exam: Blood pressure 153/69, pulse 86, temperature 97.5 F (36.4 C), temperature source  Oral, resp. rate 18, height 5\' 4"  (1.626 m), weight 130 lb 14.4 oz (59.376 kg), SpO2 99 %. General: Well dressed, well nourished in no apparent distress.   HEENT:  Normocephalic and atraumatic, no lesions.  Extraocular muscles intact. Sclerae anicteric. Pupils equal, round, reactive. No mouth sores or ulcers. Thyroid is normal size, not nodular, midline. Abdomen:  Soft, nontender, nondistended.  No palpable masses.  No hepatosplenomegaly.  No ascites. Normal bowel sounds.  No hernias.  Incisions are well healed Genitourinary: Normal EGBUS  Vaginal cuff intact.  No bleeding or discharge.  No cul de sac fullness. Extremities: No cyanosis, clubbing or edema.  No calf tenderness or erythema. No palpable cords. Psychiatric: Mood and affect are appropriate. Neurological: Awake, alert and oriented x 3. Sensation is intact, no neuropathy.  Musculoskeletal: No pain, normal strength and range of motion.  30 minutes of direct face to face counseling  time was spent with the patient. This included discussion about prognosis, therapy recommendations and postoperative side effects and are beyond the scope of routine postoperative care.  Donaciano Eva, MD

## 2015-10-26 ENCOUNTER — Telehealth: Payer: Self-pay | Admitting: Oncology

## 2015-10-26 NOTE — Telephone Encounter (Signed)
Dawn Powers called and has some questions regarding side effects of brachytherapy.  She has read a lot on line and has talked to a friend who is a gynecologist and is now very scared about treatment.  She is worried about bladder and bowel side effects as well as vaginal bleeding/discharge.  She is also worried that her vaginal area will "colapse."  Advised her that the bladder/bowel/vaginal bleeding should be mild.  Also that we will give her a vaginal dilator after treatment to avoid vaginal issues.  She would like to discuss these issues with Dr. Sondra Come and would like him to call her if possible.

## 2015-11-14 ENCOUNTER — Telehealth: Payer: Self-pay | Admitting: Oncology

## 2015-11-14 ENCOUNTER — Telehealth: Payer: Self-pay | Admitting: *Deleted

## 2015-11-14 NOTE — Telephone Encounter (Signed)
CALLED PATIENT TO REMIND OF HDR CASE FOR 11-15-15, SPOKE WITH PATIENT AND SHE IS AWARE OF THESE APPTS.

## 2015-11-14 NOTE — Telephone Encounter (Signed)
Dawn Powers called and said that she has muscle pain/spasms in her back that comes and goes.  She said she is in pain today and needs to lay flat on her back and is wondering if this will interfere with the treatment tomorrow.  She is also wondering if it is OK to take hydrocodone/acetaminophen before her appointment tomorrow.  Advised her that she will need to lay flat for the exam/CT SIM and treatment so she should be fine.  Also advised her that she can take a hydrocodone/acetaminophen before her appointment.  Dawn Powers then asked about the late side effects to her vagina.  Advised her that we will be giving her a vaginal dilator after treatment which will help with any side effects that she may experience.  Dawn Powers verbalized agreement and understanding.

## 2015-11-15 ENCOUNTER — Ambulatory Visit: Payer: Medicare Other | Admitting: Radiation Oncology

## 2015-11-15 ENCOUNTER — Encounter: Payer: Self-pay | Admitting: Radiation Oncology

## 2015-11-15 ENCOUNTER — Ambulatory Visit
Admission: RE | Admit: 2015-11-15 | Discharge: 2015-11-15 | Disposition: A | Discharge status: Home / Self Care | Payer: Medicare Other | Source: Ambulatory Visit | Attending: Radiation Oncology | Admitting: Radiation Oncology

## 2015-11-15 VITALS — BP 135/59 | HR 72 | Temp 98.1°F | Resp 16 | Ht 64.0 in | Wt 131.7 lb

## 2015-11-15 DIAGNOSIS — C541 Malignant neoplasm of endometrium: Secondary | ICD-10-CM

## 2015-11-15 DIAGNOSIS — Z87891 Personal history of nicotine dependence: Secondary | ICD-10-CM | POA: Diagnosis not present

## 2015-11-15 DIAGNOSIS — Z90722 Acquired absence of ovaries, bilateral: Secondary | ICD-10-CM | POA: Diagnosis not present

## 2015-11-15 DIAGNOSIS — E785 Hyperlipidemia, unspecified: Secondary | ICD-10-CM | POA: Diagnosis not present

## 2015-11-15 DIAGNOSIS — Z90710 Acquired absence of both cervix and uterus: Secondary | ICD-10-CM | POA: Diagnosis not present

## 2015-11-15 DIAGNOSIS — Z79899 Other long term (current) drug therapy: Secondary | ICD-10-CM | POA: Diagnosis not present

## 2015-11-15 NOTE — Progress Notes (Signed)
  Radiation Oncology         [phone] ________________________________  Name: Dawn Powers MRN: [id number]  Date: 11/15/2015  DOB: 10-31-1945  CC: [name], MD  Everitt Amber, MD  HDR BRACHYTHERAPY NOTE  DIAGNOSIS: Stage IB grade 2 endometrial cancer   Simple treatment device note: Patient had construction of her custom vaginal cylinder. She will be treated with a 3 cm diameter segmented cylinder. This conforms to her anatomy without undue discomfort.  NARRATIVE:  The patient was brought to the HDR suite. Identity was confirmed. All relevant records and images related to the planned course of therapy were reviewed. The patient freely provided informed written consent to proceed with treatment after reviewing the details related to the planned course of therapy. The consent form was witnessed and verified by the simulation staff. Then, the patient was set-up in a stable reproducible supine position for radiation therapy. The patient's custom vaginal cylinder was placed in the proximal vagina. This was affixed to the CT/MR stabilization plate to prevent slippage. Patient tolerated the placement well.  Verification simulation note:  A fiducial marker was placed within the vaginal cylinder. An AP and lateral film was then obtained through the pelvis area. This documented accurate position of the vaginal cylinder for treatment.  HDR BRACHYTHERAPY TREATMENT  The remote afterloading device was affixed to the vaginal cylinder by catheter. Patient then proceeded to undergo her first high-dose-rate treatment directed at the proximal vagina. The patient was prescribed a dose of 6 gray to be delivered to the mucosal surface. Treatment length was 4 cm. Prescription was 6 gray to the mucosal surface. Patient was treated with 1 channel using 8 dwell positions. Treatment time was 258.5 seconds. Iridium 192 was the high-dose-rate source for treatment. The patient tolerated the treatment well. After  completion of her therapy, a radiation survey was performed documenting return of the iridium source into the GammaMed safe.   PLAN: The patient will return next week for her second high-dose-rate treatment. ________________________________  Blair Promise, PhD, MD   This document serves as a record of services personally performed by Gery Pray, MD. It was created on his behalf by Darcus Austin, a trained medical scribe. The creation of this record is based on the scribe's personal observations and the provider's statements to them. This document has been checked and approved by the attending provider.

## 2015-11-15 NOTE — Progress Notes (Signed)
Please see the Nurse Progress Note in the MD Initial Consult Encounter for this patient. 

## 2015-11-15 NOTE — Progress Notes (Signed)
  Radiation Oncology         [phone] ________________________________  Name: Dawn Powers MRN: [id number]  Date: 11/15/2015  DOB: 02-Dec-1945  Vaginal Brachytherapy Procedure Note  CC: DAUB, [name], MD  Everitt Amber, MD   Diagnosis: Stage IB grade 2 endometrial cancer   Narrative:  The patient returns today for routine follow-up. Since her initial consultation with myself on 10/11/15, she saw Dr. Denman George on 10/18/15 who performed a postoperative check and discussed her pathology results and treatment plan.  ALLERGIES:  is allergic to morphine and related.  Meds: Current Outpatient Prescriptions  Medication Sig Dispense Refill  . fluticasone (CUTIVATE) 0.05 % cream Apply 1 application topically as directed. Takes for 1 week then stops for 1 week.  Apply to ears.    Marland Kitchen HYDROcodone-acetaminophen (NORCO) 5-325 MG tablet Take 1-2 tablets by mouth every 4 (four) hours as needed for moderate pain or severe pain. 60 tablet 0  . magnesium oxide (MAG-OX) 400 MG tablet Take 400 mg by mouth daily.    . multivitamin-lutein (OCUVITE-LUTEIN) CAPS capsule Take 1 capsule by mouth daily.    Marland Kitchen Propylene Glycol (SYSTANE BALANCE OP) Apply 1 drop to eye daily as needed (dry eyes).     . traZODone (DESYREL) 50 MG tablet Take 0.5-1 tablets (25-50 mg total) by mouth at bedtime as needed for sleep. 30 tablet 3  . HYDROmorphone (DILAUDID) 2 MG tablet Take 1 tablet (2 mg total) by mouth every 6 (six) hours as needed for severe pain (Do not take with Hydrocodone.APAP). (Patient not taking: Reported on 10/11/2015) 5 tablet 0   No current facility-administered medications for this encounter.    Physical Findings: The patient is in no acute distress. Patient is alert and oriented.  height is 5\' 4"  (1.626 m) and weight is 131 lb 11.2 oz (59.739 kg). Her oral temperature is 98.1 F (36.7 C). Her blood pressure is 135/59 and her pulse is 72. Her respiration is 16.     vaginal brachytherapy procedure note: The  patient was placed in the dorsal lithotomy position. A speculum exam was performed. No mucosal lesions noted. On bimanual examination the vaginal cuff was noted to be intact. No pelvic masses appreciated. The patient proceeded to undergo fitting for her vaginal cylinder. The optimal diameter to distend the vaginal vault was a 3 cm segmented cylinder. The patient tolerated the procedure well.  Lab Findings: Lab Results  Component Value Date   WBC 6.7 09/27/2015   HGB 13.6 09/27/2015   HCT 40.2 09/27/2015   MCV 93.9 09/27/2015   PLT 241 09/27/2015    Simple treatment device note:   Patient had construction of her custom vaginal cylinder. She will be treated with a 3 cm segmented cylinder. This  distends the vaginal vault without undue discomfort.   PLAN: She has been scheduled for CT simulation after this encounter.  I anticipate 5 high-dose rate treatments directed at the vaginal cuff region.  ____________________________________  Blair Promise, PhD, MD  This document serves as a record of services personally performed by Gery Pray, MD. It was created on his behalf by Darcus Austin, a trained medical scribe. The creation of this record is based on the scribe's personal observations and the provider's statements to them. This document has been checked and approved by the attending provider.

## 2015-11-15 NOTE — Progress Notes (Signed)
  Radiation Oncology         [phone] ________________________________  Name: Dawn Powers MRN: [id number]  Date: 11/15/2015  DOB: May 25, 1946  SIMULATION AND TREATMENT PLANNING NOTE HDR BRACHYTHERAPY  DIAGNOSIS: Stage IB grade 2 endometrial cancer  NARRATIVE:  The patient was brought to the [facility] suite.  Identity was confirmed.  All relevant records and images related to the planned course of therapy were reviewed.  The patient freely provided informed written consent to proceed with treatment after reviewing the details related to the planned course of therapy. The consent form was witnessed and verified by the simulation staff.  Then, the patient was set-up in a stable reproducible  supine position for radiation therapy.  CT images were obtained.  Surface markings were placed.  The CT images were loaded into the planning software.  Then the target and avoidance structures were contoured.  Treatment planning then occurred.  The radiation prescription was entered and confirmed.   I have requested : Brachytherapy Isodose Plan and Dosimetry Calculations to plan the radiation distribution.    PLAN:  The patient will receive 30 Gy in 5 fractions. The patient will be treated with a 3 cm diameter segmented cylinder. Treatment length will be 4 cm. Prescription will be to the vaginal mucosal surface. Iridium 192 with the high-dose-rate source.    ________________________________  Blair Promise, PhD, MD  This document serves as a record of services personally performed by Gery Pray, MD. It was created on his behalf by Darcus Austin, a trained medical scribe. The creation of this record is based on the scribe's personal observations and the provider's statements to them. This document has been checked and approved by the attending provider.

## 2015-11-15 NOTE — Progress Notes (Signed)
Dawn Powers here for follow up.  She reports having upper back pain today at a 2/3 out of 10.  She takes hydrocodone/acetaminophen as needed.  She denies having vaginal/rectal bleeding or discharge.  She denies having any bladder issues.  She reports having constipation with her last bowel movement yesterday.  BP 135/59 mmHg  Pulse 72  Temp(Src) 98.1 F (36.7 C) (Oral)  Resp 16  Ht 5\' 4"  (1.626 m)  Wt 131 lb 11.2 oz (59.739 kg)  BMI 22.60 kg/m2

## 2015-11-22 ENCOUNTER — Telehealth: Payer: Self-pay | Admitting: *Deleted

## 2015-11-22 NOTE — Telephone Encounter (Signed)
Called patient to remind of HDR Tx. For 11-23-15 @ 1 pm, lvm for a return call

## 2015-11-23 ENCOUNTER — Ambulatory Visit
Admission: RE | Admit: 2015-11-23 | Discharge: 2015-11-23 | Disposition: A | Discharge status: Home / Self Care | Payer: Medicare Other | Source: Ambulatory Visit | Attending: Radiation Oncology | Admitting: Radiation Oncology

## 2015-11-23 DIAGNOSIS — E785 Hyperlipidemia, unspecified: Secondary | ICD-10-CM | POA: Diagnosis not present

## 2015-11-23 DIAGNOSIS — Z90710 Acquired absence of both cervix and uterus: Secondary | ICD-10-CM | POA: Diagnosis not present

## 2015-11-23 DIAGNOSIS — C541 Malignant neoplasm of endometrium: Secondary | ICD-10-CM

## 2015-11-23 DIAGNOSIS — Z79899 Other long term (current) drug therapy: Secondary | ICD-10-CM | POA: Diagnosis not present

## 2015-11-23 DIAGNOSIS — Z87891 Personal history of nicotine dependence: Secondary | ICD-10-CM | POA: Diagnosis not present

## 2015-11-23 DIAGNOSIS — Z90722 Acquired absence of ovaries, bilateral: Secondary | ICD-10-CM | POA: Diagnosis not present

## 2015-11-23 NOTE — Progress Notes (Signed)
  Radiation Oncology         [phone] ________________________________  Name: Dawn Powers MRN: [id number]  Date: 11/23/2015  DOB: Jun 07, 1946  CC: [name], MD  Everitt Amber, MD  HDR BRACHYTHERAPY NOTE  DIAGNOSIS: Stage IB grade 2 endometrial cancer   Simple treatment device note: Patient had construction of her custom vaginal cylinder. She will be treated with a 3 cm diameter segmented cylinder. This conforms to her anatomy without undue discomfort.  NARRATIVE:  The patient was brought to the HDR suite. Identity was confirmed. All relevant records and images related to the planned course of therapy were reviewed. The patient freely provided informed written consent to proceed with treatment after reviewing the details related to the planned course of therapy. The consent form was witnessed and verified by the simulation staff. Then, the patient was set-up in a stable reproducible supine position for radiation therapy. The patient's custom vaginal cylinder was placed in the proximal vagina. This was affixed to the CT/MR stabilization plate to prevent slippage. Patient tolerated the placement well.  Verification simulation note:  A fiducial marker was placed within the vaginal cylinder. An AP and lateral film was then obtained through the pelvis area. This documented accurate position of the vaginal cylinder for treatment.  HDR BRACHYTHERAPY TREATMENT  The remote afterloading device was affixed to the vaginal cylinder by catheter. Patient then proceeded to undergo her second high-dose-rate treatment directed at the proximal vagina. The patient was prescribed a dose of 6 gray to be delivered to the mucosal surface. Treatment length was 4 cm. Prescription was 6 gray to the mucosal surface. Patient was treated with 1 channel using 8 dwell positions. Treatment time was 278.7 seconds. Iridium 192 was the high-dose-rate source for treatment. The patient tolerated the treatment well. After  completion of her therapy, a radiation survey was performed documenting return of the iridium source into the GammaMed safe.   PLAN: The patient will return next week for her third high-dose-rate treatment. ________________________________  Blair Promise, PhD, MD   This document serves as a record of services personally performed by Gery Pray, MD. It was created on his behalf by Darcus Austin, a trained medical scribe. The creation of this record is based on the scribe's personal observations and the provider's statements to them. This document has been checked and approved by the attending provider.

## 2015-11-29 ENCOUNTER — Telehealth: Payer: Self-pay | Admitting: *Deleted

## 2015-11-29 NOTE — Telephone Encounter (Signed)
Called patient to remind of HDR Tx. For 11-30-15 @ 10 am, lvm for a return call

## 2015-11-30 ENCOUNTER — Ambulatory Visit
Admission: RE | Admit: 2015-11-30 | Discharge: 2015-11-30 | Disposition: A | Discharge status: Home / Self Care | Payer: Medicare Other | Source: Ambulatory Visit | Attending: Radiation Oncology | Admitting: Radiation Oncology

## 2015-11-30 DIAGNOSIS — Z90710 Acquired absence of both cervix and uterus: Secondary | ICD-10-CM | POA: Diagnosis not present

## 2015-11-30 DIAGNOSIS — C541 Malignant neoplasm of endometrium: Secondary | ICD-10-CM

## 2015-11-30 DIAGNOSIS — Z90722 Acquired absence of ovaries, bilateral: Secondary | ICD-10-CM | POA: Diagnosis not present

## 2015-11-30 DIAGNOSIS — Z79899 Other long term (current) drug therapy: Secondary | ICD-10-CM | POA: Diagnosis not present

## 2015-11-30 DIAGNOSIS — E785 Hyperlipidemia, unspecified: Secondary | ICD-10-CM | POA: Diagnosis not present

## 2015-11-30 DIAGNOSIS — Z87891 Personal history of nicotine dependence: Secondary | ICD-10-CM | POA: Diagnosis not present

## 2015-11-30 NOTE — Progress Notes (Signed)
  Radiation Oncology         [phone] ________________________________  Name: Dawn Powers MRN: [id number]  Date: 11/30/2015  DOB: [birth year]  CC: [name], MD  Everitt Amber, MD  HDR BRACHYTHERAPY NOTE  DIAGNOSIS: Stage IB grade 2 endometrial cancer   Simple treatment device note: Patient had construction of her custom vaginal cylinder. She will be treated with a 3 cm diameter segmented cylinder. This conforms to her anatomy without undue discomfort.  NARRATIVE:  The patient was brought to the HDR suite. Identity was confirmed. All relevant records and images related to the planned course of therapy were reviewed. The patient freely provided informed written consent to proceed with treatment after reviewing the details related to the planned course of therapy. The consent form was witnessed and verified by the simulation staff. Then, the patient was set-up in a stable reproducible supine position for radiation therapy. The patient's custom vaginal cylinder was placed in the proximal vagina. This was affixed to the CT/MR stabilization plate to prevent slippage. Patient tolerated the placement well.  Verification simulation note:  A fiducial marker was placed within the vaginal cylinder. An AP and lateral film was then obtained through the pelvis area. This documented accurate position of the vaginal cylinder for treatment.  HDR BRACHYTHERAPY TREATMENT  The remote afterloading device was affixed to the vaginal cylinder by catheter. Patient then proceeded to undergo her third high-dose-rate treatment directed at the proximal vagina. The patient was prescribed a dose of 6 gray to be delivered to the mucosal surface. Treatment length was 4 cm. Prescription was 6 gray to the mucosal surface. Patient was treated with 1 channel using 8 dwell positions. Treatment time was 297.7 seconds. Iridium 192 was the high-dose-rate source for treatment. The patient tolerated the treatment well. After  completion of her therapy, a radiation survey was performed documenting return of the iridium source into the GammaMed safe.   PLAN: The patient will return next week for her fourth high-dose-rate treatment. ________________________________  Blair Promise, PhD, MD   This document serves as a record of services personally performed by Gery Pray, MD. It was created on his behalf by Derek Mound, a trained medical scribe. The creation of this record is based on the scribe's personal observations and the provider's statements to them. This document has been checked and approved by the attending provider.

## 2015-12-06 ENCOUNTER — Telehealth: Payer: Self-pay | Admitting: *Deleted

## 2015-12-06 NOTE — Telephone Encounter (Signed)
Called patient to remind of HDR Tx. For 12-07-15 @ 10 am, lvm for a return call

## 2015-12-07 ENCOUNTER — Ambulatory Visit
Admission: RE | Admit: 2015-12-07 | Discharge: 2015-12-07 | Disposition: A | Discharge status: Home / Self Care | Payer: Medicare Other | Source: Ambulatory Visit | Attending: Radiation Oncology | Admitting: Radiation Oncology

## 2015-12-07 DIAGNOSIS — C541 Malignant neoplasm of endometrium: Secondary | ICD-10-CM

## 2015-12-07 DIAGNOSIS — Z90722 Acquired absence of ovaries, bilateral: Secondary | ICD-10-CM | POA: Diagnosis not present

## 2015-12-07 DIAGNOSIS — Z79899 Other long term (current) drug therapy: Secondary | ICD-10-CM | POA: Diagnosis not present

## 2015-12-07 DIAGNOSIS — Z87891 Personal history of nicotine dependence: Secondary | ICD-10-CM | POA: Diagnosis not present

## 2015-12-07 DIAGNOSIS — E785 Hyperlipidemia, unspecified: Secondary | ICD-10-CM | POA: Diagnosis not present

## 2015-12-07 DIAGNOSIS — Z90710 Acquired absence of both cervix and uterus: Secondary | ICD-10-CM | POA: Diagnosis not present

## 2015-12-07 NOTE — Progress Notes (Signed)
  Radiation Oncology         [phone]) [phone] ________________________________  Name: Dawn Powers MRN: [id number]  Date: 12/07/2015  DOB: [birth year]  CC: [name], MD  Everitt Amber, MD  HDR BRACHYTHERAPY NOTE  DIAGNOSIS: Stage IB grade 2 endometrial cancer   Simple treatment device note: Patient had construction of her custom vaginal cylinder. She will be treated with a 3 cm diameter segmented cylinder. This conforms to her anatomy without undue discomfort.  Vaginal brachytherapy procedure: The patient was brought to the [place] suite. Identity was confirmed. All relevant records and images related to the planned course of therapy were reviewed. The patient freely provided informed written consent to proceed with treatment after reviewing the details related to the planned course of therapy. The consent form was witnessed and verified by the simulation staff. Then, the patient was set-up in a stable reproducible supine position for radiation therapy. A pelvic exam was performed. There are no pelvic masses palpated. The vaginal cuff was noted to be intact.The patient's custom vaginal cylinder was placed in the proximal vagina. This was affixed to the CT/MR stabilization plate to prevent slippage. Patient tolerated the placement well.  Verification simulation note:  A fiducial marker was placed within the vaginal cylinder. An AP and lateral film was then obtained through the pelvis area. This documented accurate position of the vaginal cylinder for treatment.  HDR BRACHYTHERAPY TREATMENT  The remote afterloading device was affixed to the vaginal cylinder by catheter. Patient then proceeded to undergo her fourth high-dose-rate treatment directed at the proximal vagina. The patient was prescribed a dose of 6 gray to be delivered to the mucosal surface. Treatment length was 4 cm. Prescription was 6 gray to the mucosal surface. Patient was treated with 1 channel using 8 dwell positions. Treatment time  was 317.8 seconds. Iridium 192 was the high-dose-rate source for treatment. The patient tolerated the treatment well. After completion of her therapy, a radiation survey was performed documenting return of the iridium source into the Gamma[employer] safe.   PLAN: The patient will return next week for her fifth high-dose-rate treatment. ________________________________  Blair Promise, PhD, MD   This document serves as a record of services personally performed by Gery Pray, MD. It was created on his behalf by Derek Mound, a trained medical scribe. The creation of this record is based on the scribe's personal observations and the provider's statements to them. This document has been checked and approved by the attending provider.

## 2015-12-13 ENCOUNTER — Telehealth: Payer: Self-pay | Admitting: *Deleted

## 2015-12-13 NOTE — Telephone Encounter (Signed)
CALLED PATIENT TO REMIND OF HDR TX. FOR 12-14-15 @ 11 AM, LVM FOR A RETURN CALL

## 2015-12-14 ENCOUNTER — Ambulatory Visit
Admission: RE | Admit: 2015-12-14 | Discharge: 2015-12-14 | Disposition: A | Discharge status: Home / Self Care | Payer: Medicare Other | Source: Ambulatory Visit | Attending: Radiation Oncology | Admitting: Radiation Oncology

## 2015-12-14 ENCOUNTER — Encounter: Payer: Self-pay | Admitting: Radiation Oncology

## 2015-12-14 DIAGNOSIS — Z90710 Acquired absence of both cervix and uterus: Secondary | ICD-10-CM | POA: Diagnosis not present

## 2015-12-14 DIAGNOSIS — C541 Malignant neoplasm of endometrium: Secondary | ICD-10-CM

## 2015-12-14 DIAGNOSIS — Z79899 Other long term (current) drug therapy: Secondary | ICD-10-CM | POA: Diagnosis not present

## 2015-12-14 DIAGNOSIS — Z87891 Personal history of nicotine dependence: Secondary | ICD-10-CM | POA: Diagnosis not present

## 2015-12-14 DIAGNOSIS — Z90722 Acquired absence of ovaries, bilateral: Secondary | ICD-10-CM | POA: Diagnosis not present

## 2015-12-14 DIAGNOSIS — E785 Hyperlipidemia, unspecified: Secondary | ICD-10-CM | POA: Diagnosis not present

## 2015-12-14 NOTE — Progress Notes (Signed)
  Radiation Oncology         (336) [phone] ________________________________  Name: Dawn Powers MRN: [id number]  Date: 12/14/2015  DOB: October 06, 1945  CC: [name], MD  Everitt Amber, MD  HDR BRACHYTHERAPY NOTE  DIAGNOSIS: Stage IB grade 2 endometrial cancer   Simple treatment device note: Patient had construction of her custom vaginal cylinder. She will be treated with a 3 cm diameter segmented cylinder. This conforms to her anatomy without undue discomfort.  Vaginal brachytherapy procedure: The patient was brought to the [place] suite. Identity was confirmed. All relevant records and images related to the planned course of therapy were reviewed. The patient freely provided informed written consent to proceed with treatment after reviewing the details related to the planned course of therapy. The consent form was witnessed and verified by the simulation staff. Then, the patient was set-up in a stable reproducible supine position for radiation therapy. A pelvic exam was performed. There are no pelvic masses palpated. The vaginal cuff was noted to be intact.The patient's custom vaginal cylinder was placed in the proximal vagina. This was affixed to the CT/MR stabilization plate to prevent slippage. Patient tolerated the placement well.  Verification simulation note:  A fiducial marker was placed within the vaginal cylinder. An AP and lateral film was then obtained through the pelvis area. This documented accurate position of the vaginal cylinder for treatment.  HDR BRACHYTHERAPY TREATMENT  The remote afterloading device was affixed to the vaginal cylinder by catheter. Patient then proceeded to undergo her fifth high-dose-rate treatment directed at the proximal vagina. The patient was prescribed a dose of 6 gray to be delivered to the mucosal surface. Treatment length was 4 cm. Prescription was 6 gray to the mucosal surface. Patient was treated with 1 channel using 8 dwell positions. Treatment time  was 339.5 seconds. Iridium 192 was the high-dose-rate source for treatment. The patient tolerated the treatment well. After completion of her therapy, a radiation survey was performed documenting return of the iridium source into the GammaMed safe.   PLAN: Routine follow up in 1 month. ________________________________  Blair Promise, PhD, MD   This document serves as a record of services personally performed by Gery Pray, MD. It was created on his behalf by Jenell Milliner, a trained medical scribe. The creation of this record is based on the scribe's personal observations and the provider's statements to them. This document has been checked and approved by the attending provider.

## 2015-12-24 NOTE — Progress Notes (Signed)
°  Radiation Oncology         [phone] ________________________________  Name: Dawn Powers MRN: [id number]  Date: 12/14/2015  DOB: 1946/02/22  End of Treatment Note  DIAGNOSIS: Stage IB grade 2 endometrial cancer  Indication for treatment:  Postop, risk for vaginal cuff recurrence       Radiation treatment dates:  11/15/2015-12/14/2015  Site/dose:   Vaginal cuff 30 gray in 5 fractions area the patient was treated with a 3 cm diameter segmented cylinder. Treatment length 4 cm with prescription to the mucosal surface. Iridium 192 was the high-dose-rate source    Narrative: The patient tolerated radiation treatment relatively well.   Minimal discomfort within the vaginal area and with urination  Plan: The patient has completed radiation treatment. The patient will return to radiation oncology clinic for routine followup in one month. I advised them to call or return sooner if they have any questions or concerns related to their recovery or treatment.  -----------------------------------  Blair Promise, PhD, MD

## 2016-01-02 ENCOUNTER — Other Ambulatory Visit: Payer: Self-pay | Admitting: Emergency Medicine

## 2016-01-02 DIAGNOSIS — M542 Cervicalgia: Secondary | ICD-10-CM

## 2016-01-02 DIAGNOSIS — M4120 Other idiopathic scoliosis, site unspecified: Secondary | ICD-10-CM

## 2016-01-02 DIAGNOSIS — M546 Pain in thoracic spine: Secondary | ICD-10-CM

## 2016-01-02 MED ORDER — HYDROCODONE-ACETAMINOPHEN 5-325 MG PO TABS: 1.0000 | ORAL_TABLET | ORAL | Status: DC | PRN

## 2016-01-08 ENCOUNTER — Encounter: Payer: Self-pay | Admitting: Oncology

## 2016-01-10 ENCOUNTER — Ambulatory Visit
Admission: RE | Admit: 2016-01-10 | Discharge: 2016-01-10 | Disposition: A | Discharge status: Home / Self Care | Payer: Medicare Other | Source: Ambulatory Visit | Attending: Radiation Oncology | Admitting: Radiation Oncology

## 2016-01-10 ENCOUNTER — Encounter: Payer: Self-pay | Admitting: Radiation Oncology

## 2016-01-10 VITALS — BP 135/61 | HR 65 | Temp 98.1°F | Ht 64.0 in | Wt 131.7 lb

## 2016-01-10 DIAGNOSIS — C541 Malignant neoplasm of endometrium: Secondary | ICD-10-CM

## 2016-01-10 NOTE — Progress Notes (Signed)
Patient was given a size S+ and M Vaginal dilator and surgilube.  Patient was educated in teach back mode to apply surgilube to the dilator and to insert for 10 minutes 3 times a week (Monday, Wednesday, Friday) and to wash with soap and water after use.  Patient verbalized understanding and agreement. 

## 2016-01-10 NOTE — Progress Notes (Addendum)
Dawn Powers here for follow up.  She continues to report lower and upper back pain.  She fell in the rain a week and half ago and her lower back has been hurting since.  She takes norco as needed.  She denies having bowel or bladder issues.  She denies having vaginal/rectal bleeding.  She reports her energy level is down.    BP 135/61 mmHg  Pulse 65  Temp(Src) 98.1 F (36.7 C) (Oral)  Ht 5\' 4"  (1.626 m)  Wt 131 lb 11.2 oz (59.739 kg)  BMI 22.60 kg/m2   Wt Readings from Last 3 Encounters:  01/10/16 131 lb 11.2 oz (59.739 kg)  11/15/15 131 lb 11.2 oz (59.739 kg)  10/18/15 130 lb 14.4 oz (59.376 kg)

## 2016-01-10 NOTE — Progress Notes (Signed)
Radiation Oncology         [phone]) [phone] ________________________________  Name: Dawn Powers MRN: [id number]  Date: 01/10/2016  DOB: 11-07-1945  Follow-Up Visit Note  CC: Dawn Reichmann, MD  Dawn Amber, MD    ICD-9-CM ICD-10-CM   1. Endometrial cancer (HCC) 182.0 C54.1     Diagnosis:   Stage IB grade 2 endometrial cancer  Interval Since Last Radiation:  1  months  Narrative:  The patient returns today for routine follow-up.  Dawn Powers here for follow up. She continues to report lower and upper back pain. She fell in the rain a week and half ago and her lower back has been hurting since. She takes norco as needed. She denies having bowel or bladder issues. She denies having vaginal/rectal bleeding. She reports her energy level is down and she has trouble sleeping. Reports while completing treatment she missed her routine breast exam with her gynecologist, Dr. Willis Modena. She is up to date on her screening mammogram at the breast center. Denies any nipple discharge or breast pain.                               ALLERGIES:  is allergic to morphine and related.  Meds: Current Outpatient Prescriptions  Medication Sig Dispense Refill  . HYDROcodone-acetaminophen (NORCO) 5-325 MG tablet Take 1-2 tablets by mouth every 4 (four) hours as needed for moderate pain or severe pain. 60 tablet 0  . magnesium oxide (MAG-OX) 400 MG tablet Take 400 mg by mouth daily.    . multivitamin-lutein (OCUVITE-LUTEIN) CAPS capsule Take 1 capsule by mouth daily.    Marland Kitchen Propylene Glycol (SYSTANE BALANCE OP) Apply 1 drop to eye daily as needed (dry eyes).     . psyllium (METAMUCIL) 58.6 % powder Take 1 packet by mouth daily.    . traZODone (DESYREL) 50 MG tablet Take 0.5-1 tablets (25-50 mg total) by mouth at bedtime as needed for sleep. 30 tablet 3  . fluticasone (CUTIVATE) 0.05 % cream Apply 1 application topically as directed. Reported on 01/10/2016    . HYDROmorphone (DILAUDID) 2 MG tablet Take 1 tablet (2 mg  total) by mouth every 6 (six) hours as needed for severe pain (Do not take with Hydrocodone.APAP). (Patient not taking: Reported on 10/11/2015) 5 tablet 0   No current facility-administered medications for this encounter.    Physical Findings: The patient is in no acute distress. Patient is alert and oriented.  height is 5\' 4"  (1.626 m) and weight is 131 lb 11.2 oz (59.739 kg). Her oral temperature is 98.1 F (36.7 C). Her blood pressure is 135/61 and her pulse is 65. Marland Kitchen   No palpable subclavicular or axillary adenopathy. The lungs are clear to auscultation. The heart has a regular rhythm and rate. The abdomen is soft and nontender with normal bowel sounds.  Breast exam revealed no nipple discharge or bleeding with no palpable mass appreciated.   Lab Findings: Lab Results  Component Value Date   WBC 6.7 09/27/2015   HGB 13.6 09/27/2015   HCT 40.2 09/27/2015   MCV 93.9 09/27/2015   PLT 241 09/27/2015    Radiographic Findings: No results found.  Impression:    Patient is doing well after her vaginal brachytherapy. No appreciable side effects. Patient did undergo a breast exam at her request since she missed this with her gynecologist.   Plan:  She will follow up with Dr. Denman George in 2  months. She will follow up in radiation oncology in 5 months. Patient was given a vaginal dilator and instructions on its use today.  -----------------------------------  Blair Promise, PhD, MD  This document serves as a record of services personally performed by Gery Pray, MD. It was created on his behalf by Arlyce Harman, a trained medical scribe. The creation of this record is based on the scribe's personal observations and the provider's statements to them. This document has been checked and approved by the attending provider.

## 2016-03-06 ENCOUNTER — Other Ambulatory Visit: Payer: Self-pay | Admitting: Emergency Medicine

## 2016-03-18 ENCOUNTER — Ambulatory Visit: Payer: Medicare Other | Attending: Gynecologic Oncology | Admitting: Gynecologic Oncology

## 2016-03-18 ENCOUNTER — Encounter: Payer: Self-pay | Admitting: Gynecologic Oncology

## 2016-03-18 VITALS — BP 151/64 | HR 63 | Temp 98.3°F | Resp 18 | Ht 64.0 in | Wt 130.6 lb

## 2016-03-18 DIAGNOSIS — Z885 Allergy status to narcotic agent status: Secondary | ICD-10-CM | POA: Insufficient documentation

## 2016-03-18 DIAGNOSIS — Z8051 Family history of malignant neoplasm of kidney: Secondary | ICD-10-CM | POA: Insufficient documentation

## 2016-03-18 DIAGNOSIS — E785 Hyperlipidemia, unspecified: Secondary | ICD-10-CM | POA: Diagnosis not present

## 2016-03-18 DIAGNOSIS — Z8042 Family history of malignant neoplasm of prostate: Secondary | ICD-10-CM | POA: Insufficient documentation

## 2016-03-18 DIAGNOSIS — Z9889 Other specified postprocedural states: Secondary | ICD-10-CM | POA: Insufficient documentation

## 2016-03-18 DIAGNOSIS — Z923 Personal history of irradiation: Secondary | ICD-10-CM | POA: Diagnosis not present

## 2016-03-18 DIAGNOSIS — R011 Cardiac murmur, unspecified: Secondary | ICD-10-CM | POA: Insufficient documentation

## 2016-03-18 DIAGNOSIS — Z87891 Personal history of nicotine dependence: Secondary | ICD-10-CM | POA: Diagnosis not present

## 2016-03-18 DIAGNOSIS — C541 Malignant neoplasm of endometrium: Secondary | ICD-10-CM | POA: Insufficient documentation

## 2016-03-18 DIAGNOSIS — Z90710 Acquired absence of both cervix and uterus: Secondary | ICD-10-CM | POA: Diagnosis not present

## 2016-03-18 DIAGNOSIS — Z800 Family history of malignant neoplasm of digestive organs: Secondary | ICD-10-CM | POA: Insufficient documentation

## 2016-03-18 DIAGNOSIS — M545 Low back pain: Secondary | ICD-10-CM | POA: Diagnosis not present

## 2016-03-18 DIAGNOSIS — Z9221 Personal history of antineoplastic chemotherapy: Secondary | ICD-10-CM

## 2016-03-18 DIAGNOSIS — Z8542 Personal history of malignant neoplasm of other parts of uterus: Secondary | ICD-10-CM

## 2016-03-18 DIAGNOSIS — M1990 Unspecified osteoarthritis, unspecified site: Secondary | ICD-10-CM | POA: Insufficient documentation

## 2016-03-18 DIAGNOSIS — F1010 Alcohol abuse, uncomplicated: Secondary | ICD-10-CM | POA: Diagnosis not present

## 2016-03-18 NOTE — Progress Notes (Signed)
FOLLOW UP VISIT  Assessment:    70 y.o. 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   S/p robotic hysterectomy, BSO, sentinel lymph node biopsy on 09/26/15 with high intermediate risk factors for recurrence, s/p vaginal brachytherapy adjuvant therapy completed in March, 2017.  No evidence of recurrence on today's eam Plan: Discussed symptoms concerning for recurrence and recommendation to return to see [place] immediately should they develop.  Return to clinic in 3 months to see Dr Sondra Come and in 6 months to see myself.  HPI:  Dawn Powers is a 70 y.o. year old initially seen in consultation on 09/08/15 referred by Dr Meissinger for grade 2 endometrial cancer.  She then underwent a robotic hysterectomy, BSO and bilateral SLN biopsy on [id number] without complications.  Her postoperative course was uncomplicated.  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 She was recommended adjuvant vaginal brachytherapy for high/intermediate risk factors.  Interval Hx:  She completed vaginal brachytherapy in March, 2017.  She is seen today for a routine surveillance visit. She has no complaints concerning for recurrence. She is using her vaginal dilator but is not sexually active. No bleeding.  Past Medical History  Diagnosis Date  . Hyperlipidemia   . Arthritis   . Heart murmur   . Pain     lower back -hx "DDD"  . Cancer [facility])     Endometrial   . Radiation 11/15/15, 11/23/15, 11/30/15, 12/07/15, 12/14/15    HDR brachytherapy   Past Surgical History  Procedure Laterality Date  . Fracture surgery Right     ankle  . Ganglion cyst excision    . Colonoscopy    . Robotic assisted total hysterectomy with bilateral salpingo oopherectomy Bilateral 09/26/2015    Procedure: ROBOTIC ASSISTED TOTAL HYSTERECTOMY WITH BILATERAL SALPINGO OOPHORECTOMY;   Surgeon: Everitt Amber, MD;  Location: [facility] ORS;  Service: Gynecology;  Laterality: Bilateral;  . Lymph node biopsy N/A 09/26/2015    Procedure: SENTINEL LYMPH NODE BIOPSY;  Surgeon: Everitt Amber, MD;  Location: [facility] OR[facility];  Service: Gynecology;  Laterality: N/A;   Family History  Problem Relation Age of Onset  . Heart disease Mother   . Alcohol abuse Mother     breast cancer - questionable   . Dementia Mother   . Alcohol abuse Father   . Prostate cancer Father 19    prostate and colon- unsure of primary   . Kidney cancer Brother   . Alcohol abuse Maternal Grandfather   . Alcohol abuse Sister   . Esophageal cancer Neg Hx   . Stomach cancer Neg Hx   . Rectal cancer Neg Hx    Social History   Social History  . Marital Status: Married    Spouse Name: N/A  . Number of Children: 2  . Years of Education: N/A   Occupational History  . Not on file.   Social History Main Topics  . Smoking status: Former Smoker -- 4 years    Types: Cigarettes    Quit date: 09/30/1998  . Smokeless tobacco: Never Used  . Alcohol Use: 1.8 oz/week    3 Standard drinks or equivalent per week     Comment: [employer] - daily  . Drug Use: No  . Sexual Activity:    Partners: Female    Museum/gallery curator: None   Other Topics Concern  . Not on  file   Social History Narrative   Married. Education: college and Other. Exercise 4 times a week for 50 minutes.   Current Outpatient Prescriptions on File Prior to Visit  Medication Sig Dispense Refill  . HYDROmorphone (DILAUDID) 2 MG tablet Take 1 tablet (2 mg total) by mouth every 6 (six) hours as needed for severe pain (Do not take with Hydrocodone.APAP). 5 tablet 0  . magnesium oxide (MAG-OX) 400 MG tablet Take 400 mg by mouth daily.    . multivitamin-lutein (OCUVITE-LUTEIN) CAPS capsule Take 1 capsule by mouth daily.    Marland Kitchen Propylene Glycol (SYSTANE BALANCE OP) Apply 1 drop to eye daily as needed (dry eyes).     . psyllium (METAMUCIL) 58.6 % powder Take  1 packet by mouth daily.    . traZODone (DESYREL) 50 MG tablet TAKE 1/2 TO 1 TABLET AT BEDTIME AS NEEDED FOR SLEEP. 30 tablet 0  . HYDROcodone-acetaminophen (NORCO) 5-325 MG tablet Take 1-2 tablets by mouth every 4 (four) hours as needed for moderate pain or severe pain. 60 tablet 0   No current facility-administered medications on file prior to visit.   Allergies  Allergen Reactions  . Morphine And Related Hives      Review of systems: Constitutional:  She has no weight gain or weight loss. She has no fever or chills. Eyes: No blurred vision Ears, Nose, Mouth, Throat: No dizziness, headaches or changes in hearing. No mouth sores. Cardiovascular: No chest pain, palpitations or edema. Respiratory:  No shortness of breath, wheezing or cough Gastrointestinal: She has normal bowel movements without diarrhea or constipation. She denies any nausea or vomiting. She denies blood in her stool or heart burn. Genitourinary:  She denies pelvic pain, pelvic pressure or changes in her urinary function. She has no hematuria, dysuria, or incontinence. She has no irregular vaginal bleeding or vaginal discharge Musculoskeletal: Denies muscle weakness or joint pains.  Skin:  She has no skin changes, rashes or itching Neurological:  Denies dizziness or headaches. No neuropathy, no numbness or tingling. Psychiatric:  She denies depression or anxiety. Hematologic/Lymphatic:   No easy bruising or bleeding   Physical Exam: Blood pressure 151/64, pulse 63, temperature 98.3 F (36.8 C), temperature source Oral, resp. rate 18, height 5\' 4"  (1.626 m), weight 130 lb 9.6 oz (59.24 kg), SpO2 99 %. General: Well dressed, well nourished in no apparent distress.   HEENT:  Normocephalic and atraumatic, no lesions.  Extraocular muscles intact. Sclerae anicteric. Pupils equal, round, reactive. No mouth sores or ulcers. Thyroid is normal size, not nodular, midline. Abdomen:  Soft, nontender, nondistended.  No palpable  masses.  No hepatosplenomegaly.  No ascites. Normal bowel sounds.  No hernias.  Incisions are well healed Genitourinary: Normal EGBUS  Vaginal cuff intact.  No bleeding or discharge.  No cul de sac fullness. Mild radiation changes to vaginal mucosa. Extremities: No cyanosis, clubbing or edema.  No calf tenderness or erythema. No palpable cords. Psychiatric: Mood and affect are appropriate. Neurological: Awake, alert and oriented x 3. Sensation is intact, no neuropathy.  Musculoskeletal: No pain, normal strength and range of motion.  Donaciano Eva, MD

## 2016-03-18 NOTE — Patient Instructions (Signed)
Plan to follow up with Dr. Denman George in December and Dr. Sondra Come as scheduled for Sept 2017.  Please call for any questions, concerns, or new symptoms such as vaginal bleeding.

## 2016-04-11 ENCOUNTER — Encounter: Payer: Self-pay | Admitting: Emergency Medicine

## 2016-04-11 ENCOUNTER — Ambulatory Visit (INDEPENDENT_AMBULATORY_CARE_PROVIDER_SITE_OTHER): Payer: Medicare Other | Admitting: Emergency Medicine

## 2016-04-11 VITALS — BP 120/70 | HR 68 | Temp 98.4°F | Resp 16 | Ht 63.0 in | Wt 132.6 lb

## 2016-04-11 DIAGNOSIS — M4120 Other idiopathic scoliosis, site unspecified: Secondary | ICD-10-CM | POA: Diagnosis not present

## 2016-04-11 DIAGNOSIS — E785 Hyperlipidemia, unspecified: Secondary | ICD-10-CM

## 2016-04-11 DIAGNOSIS — M542 Cervicalgia: Secondary | ICD-10-CM | POA: Diagnosis not present

## 2016-04-11 DIAGNOSIS — D72819 Decreased white blood cell count, unspecified: Secondary | ICD-10-CM | POA: Diagnosis not present

## 2016-04-11 DIAGNOSIS — M546 Pain in thoracic spine: Secondary | ICD-10-CM

## 2016-04-11 DIAGNOSIS — G894 Chronic pain syndrome: Secondary | ICD-10-CM | POA: Diagnosis not present

## 2016-04-11 LAB — COMPLETE METABOLIC PANEL WITH GFR
ALT: 13 U/L (ref 6–29)
AST: 18 U/L (ref 10–35)
Albumin: 4.4 g/dL (ref 3.6–5.1)
Alkaline Phosphatase: 59 U/L (ref 33–130)
BUN: 17 mg/dL (ref 7–25)
CO2: 24 mmol/L (ref 20–31)
Calcium: 9.2 mg/dL (ref 8.6–10.4)
Chloride: 105 mmol/L (ref 98–110)
Creat: 0.84 mg/dL (ref 0.60–0.93)
GFR, Est African American: 80 mL/min (ref >=60)
GFR, Est Non African American: 70 mL/min (ref >=60)
Glucose, Bld: 88 mg/dL (ref 65–99)
Potassium: 4.3 mmol/L (ref 3.5–5.3)
Sodium: 140 mmol/L (ref 135–146)
Total Bilirubin: 0.5 mg/dL (ref 0.2–1.2)
Total Protein: 6.4 g/dL (ref 6.1–8.1)

## 2016-04-11 LAB — POCT CBC
Granulocyte percent: 72.6 %G (ref 37–80)
HCT, POC: 39.3 % (ref 37.7–47.9)
Hemoglobin: 14.1 g/dL (ref 12.2–16.2)
Lymph, poc: 1 (ref 0.6–3.4)
MCH, POC: 33.4 pg — AB (ref 27–31.2)
MCHC: 35.8 g/dL — AB (ref 31.8–35.4)
MCV: 93.3 fL (ref 80–97)
MID (cbc): 0.4 (ref 0–0.9)
MPV: 7.1 fL (ref 0–99.8)
POC Granulocyte: 3.6 (ref 2–6.9)
POC LYMPH PERCENT: 19.4 %L (ref 10–50)
POC MID %: 8 %M (ref 0–12)
Platelet Count, POC: 212 10*3/uL (ref 142–424)
RBC: 4.21 M/uL (ref 4.04–5.48)
RDW, POC: 13.2 %
WBC: 4.9 10*3/uL (ref 4.6–10.2)

## 2016-04-11 LAB — LIPID PANEL
Cholesterol: 209 mg/dL — ABNORMAL HIGH (ref 125–200)
HDL: 90 mg/dL (ref >=46)
LDL Cholesterol: 102 mg/dL (ref <130)
Total CHOL/HDL Ratio: 2.3 Ratio (ref <=5.0)
Triglycerides: 85 mg/dL (ref <150)
VLDL: 17 mg/dL (ref <30)

## 2016-04-11 MED ORDER — TRAZODONE HCL 50 MG PO TABS: ORAL_TABLET | ORAL | Status: DC

## 2016-04-11 MED ORDER — HYDROCODONE-ACETAMINOPHEN 5-325 MG PO TABS: 1.0000 | ORAL_TABLET | ORAL | Status: DC | PRN

## 2016-04-11 NOTE — Progress Notes (Addendum)
Patient ID: Dawn Powers, female   DOB: December 17, 1945, 70 y.o.   MRN: [id number]    By signing my name below, I, Dawn Powers, attest that this documentation has been prepared under the direction and in the presence of Dawn Russian, MD Electronically Signed: Ladene Artist, ED Scribe 04/11/2016 at 11:39 AM.  Chief Complaint:  Chief Complaint  Patient presents with  . hip pain    right x 6 wkis  . Back Pain    lower back   HPI: Dawn Powers is a 70 y.o. female, with a h/o lumbar DDD, who reports to [facility] today complaining of persistent low back pain. Pt describes back pain as spasms that is exacerbated as movement. She currently treats back pain with at least 10 minutes of heat, tai chi and lying flat for a few moments. No medications tried PTA. She no longer follows up with a chiropractor.   Right Hip Pain Pt also reports a slip and fall that occurred outdoors in the rain approximately 6 weeks ago. She states that she twisted her back and grabbed onto a gate to try to prevent her fall. Pt states that she has had persistent right hip pain since the fall. No treatments tried PTA.   Right Breast Pain  Pt also presents with intermittent right breast pain for several weeks. She describes breast pain as spasms, similar to her back pain, which wraps around from her upper back. Pt's mammogram is UTD.   Endometrial CA Pt has finished her 5 weeks of radiation for endometrial CA. She denies hair loss and any other complications.  Past Medical History  Diagnosis Date  . Hyperlipidemia   . Arthritis   . Heart murmur   . Pain     lower back -hx "DDD"  . Cancer [facility])     Endometrial   . Radiation 11/15/15, 11/23/15, 11/30/15, 12/07/15, 12/14/15    HDR brachytherapy   Past Surgical History  Procedure Laterality Date  . Fracture surgery Right     ankle  . Ganglion cyst excision    . Colonoscopy    . Robotic assisted total hysterectomy with bilateral salpingo oopherectomy Bilateral 09/26/2015   Procedure: ROBOTIC ASSISTED TOTAL HYSTERECTOMY WITH BILATERAL SALPINGO OOPHORECTOMY;  Surgeon: Everitt Amber, MD;  Location: [facility];  Service: Gynecology;  Laterality: Bilateral;  . Lymph node biopsy N/A 09/26/2015    Procedure: SENTINEL LYMPH NODE BIOPSY;  Surgeon: Everitt Amber, MD;  Location: [facility];  Service: Gynecology;  Laterality: N/A;   Social History   Social History  . Marital Status: Married    Spouse Name: N/A  . Number of Children: 2  . Years of Education: N/A   Social History Main Topics  . Smoking status: Former Smoker -- 4 years    Types: Cigarettes    Quit date: 09/30/1998  . Smokeless tobacco: Never Used  . Alcohol Use: 1.8 oz/week    3 Standard drinks or equivalent per week     Comment: WINE AND BOURBON - daily  . Drug Use: No  . Sexual Activity:    Partners: Female    Museum/gallery curator: None   Other Topics Concern  . None   Social History Narrative   Married. Education: college and Other. Exercise 4 times a week for 50 minutes.   Family History  Problem Relation Age of Onset  . Heart disease Mother   . Alcohol abuse Mother     breast cancer - questionable   . Dementia  Mother   . Alcohol abuse Father   . Prostate cancer Father 38    prostate and colon- unsure of primary   . Kidney cancer Brother   . Alcohol abuse Maternal Grandfather   . Alcohol abuse Sister   . Esophageal cancer Neg Hx   . Stomach cancer Neg Hx   . Rectal cancer Neg Hx    Allergies  Allergen Reactions  . Morphine And Related Hives   Prior to Admission medications   Medication Sig Start Date End Date Taking? Authorizing Provider  HYDROcodone-acetaminophen (NORCO) 5-325 MG tablet Take 1-2 tablets by mouth every 4 (four) hours as needed for moderate pain or severe pain. 01/02/16   Dawn Russian, MD  HYDROmorphone (DILAUDID) 2 MG tablet Take 1 tablet (2 mg total) by mouth every 6 (six) hours as needed for severe pain (Do not take with Hydrocodone.APAP). 09/27/15   Melissa D Cross,  NP  magnesium oxide (MAG-OX) 400 MG tablet Take 400 mg by mouth daily.    Historical Provider, MD  multivitamin-lutein (OCUVITE-LUTEIN) CAPS capsule Take 1 capsule by mouth daily.    Historical Provider, MD  Propylene Glycol (SYSTANE BALANCE OP) Apply 1 drop to eye daily as needed (dry eyes).     Historical Provider, MD  psyllium (METAMUCIL) 58.6 % powder Take 1 packet by mouth daily.    Historical Provider, MD  traZODone (DESYREL) 50 MG tablet TAKE 1/2 TO 1 TABLET AT BEDTIME AS NEEDED FOR SLEEP. 03/06/16   Dawn Russian, MD   ROS: The patient denies fevers, chills, night sweats, unintentional weight loss, chest pain, palpitations, wheezing, dyspnea on exertion, nausea, vomiting, abdominal pain, dysuria, hematuria, melena, numbness, weakness, or tingling.  All other systems have been reviewed and were otherwise negative with the exception of those mentioned in the HPI and as above.    PHYSICAL EXAM: Filed Vitals:   04/11/16 1131  BP: 120/70  Pulse: 68  Temp: 98.4 F (36.9 C)  Resp: 16   Body mass index is 23.49 kg/(m^2).  General: Alert, no acute distress HEENT:  Normocephalic, atraumatic, oropharynx patent. Eye: Juliette Mangle [facility] Cardiovascular:  Regular rate and rhythm, no rubs murmurs or gallops. No Carotid bruits, radial pulse intact. No pedal edema.  Respiratory: Clear to auscultation bilaterally. No wheezes, rales, or rhonchi. No cyanosis, no use of accessory musculature Abdominal: No organomegaly, abdomen is soft and non-tender, positive bowel sounds. No masses. Musculoskeletal: Gait intact. No edema. Tenderness  Skin: No rashes. Neurologic: Facial musculature symmetric. Psychiatric: Patient acts appropriately throughout our interaction. Lymphatic: No cervical or submandibular lymphadenopathy  LABS: Results for orders placed or performed in visit on 04/11/16  POCT CBC  Result Value Ref Range   WBC 4.9 4.6 - 10.2 K/uL   Lymph, poc 1.0 0.6 - 3.4   POC LYMPH PERCENT 19.4 10 - 50  %L   MID (cbc) 0.4 0 - 0.9   POC MID % 8.0 0 - 12 %M   POC Granulocyte 3.6 2 - 6.9   Granulocyte percent 72.6 37 - 80 %G   RBC 4.21 4.04 - 5.48 M/uL   Hemoglobin 14.1 12.2 - 16.2 g/dL   HCT, POC 39.3 37.7 - 47.9 %   MCV 93.3 80 - 97 fL   MCH, POC 33.4 (A) 27 - 31.2 pg   MCHC 35.8 (A) 31.8 - 35.4 g/dL   RDW, POC 13.2 %   Platelet Count, POC 212 142 - 424 K/uL   MPV 7.1 0 - 99.8 fL  EKG/XRAY:   Primary read interpreted by Dr. Everlene Farrier at [facility].  ASSESSMENT/PLAN: And looks great. Her immunizations are up-to-date. I advised her against any overseas travel. Her tai chi has helped her significantly. She rarely takes a hydrocodone. She is not currently going to any type of physical therapy or back therapy.I personally performed the services described in this documentation, which was scribed in my presence. The recorded information has been reviewed and is accurate.    Gross sideeffects, risk and benefits, and alternatives of medications d/w patient. Patient is aware that all medications have potential sideeffects and we are unable to predict every sideeffect or drug-drug interaction that may occur.  [name] MD 04/11/2016 11:39 AM

## 2016-04-11 NOTE — Patient Instructions (Signed)
     IF you received an x-ray today, you will receive an invoice from [facility]. Please contact [facility] at [phone] with questions or concerns regarding your invoice.   IF you received labwork today, you will receive an invoice from [facility]. Please contact [facility] at [phone] with questions or concerns regarding your invoice.   Our billing staff will not be able to assist you with questions regarding bills from these companies.  You will be contacted with the lab results as soon as they are available. The fastest way to get your results is to activate your My Chart account. Instructions are located on the last page of this paperwork. If you have not heard from us regarding the results in 2 weeks, please contact this office.      

## 2016-05-03 ENCOUNTER — Other Ambulatory Visit: Payer: Self-pay | Admitting: Gynecologic Oncology

## 2016-05-03 DIAGNOSIS — Z1231 Encounter for screening mammogram for malignant neoplasm of breast: Secondary | ICD-10-CM

## 2016-05-15 ENCOUNTER — Ambulatory Visit: Payer: Medicare Other

## 2016-05-22 ENCOUNTER — Ambulatory Visit
Admission: RE | Admit: 2016-05-22 | Discharge: 2016-05-22 | Disposition: A | Discharge status: Home / Self Care | Payer: Medicare Other | Source: Ambulatory Visit | Attending: Gynecologic Oncology | Admitting: Gynecologic Oncology

## 2016-05-22 DIAGNOSIS — Z1231 Encounter for screening mammogram for malignant neoplasm of breast: Secondary | ICD-10-CM

## 2016-06-05 ENCOUNTER — Ambulatory Visit (INDEPENDENT_AMBULATORY_CARE_PROVIDER_SITE_OTHER): Payer: Medicare Other | Admitting: Family Medicine

## 2016-06-05 ENCOUNTER — Encounter: Payer: Self-pay | Admitting: Family Medicine

## 2016-06-05 DIAGNOSIS — Z23 Encounter for immunization: Secondary | ICD-10-CM | POA: Diagnosis not present

## 2016-06-05 DIAGNOSIS — M546 Pain in thoracic spine: Secondary | ICD-10-CM | POA: Diagnosis not present

## 2016-06-05 DIAGNOSIS — M4120 Other idiopathic scoliosis, site unspecified: Secondary | ICD-10-CM | POA: Diagnosis not present

## 2016-06-05 DIAGNOSIS — M542 Cervicalgia: Secondary | ICD-10-CM

## 2016-06-05 DIAGNOSIS — Z79891 Long term (current) use of opiate analgesic: Secondary | ICD-10-CM | POA: Diagnosis not present

## 2016-06-05 MED ORDER — HYDROCODONE-ACETAMINOPHEN 5-325 MG PO TABS: 1.0000 | ORAL_TABLET | ORAL | 0 refills | Status: DC | PRN

## 2016-06-05 NOTE — Progress Notes (Signed)
Pre visit review using our clinic review tool, if applicable. No additional management support is needed unless otherwise documented below in the visit note. 

## 2016-06-05 NOTE — Patient Instructions (Signed)
It was very nice to see you today It is ok to take 75 mg of trazodone at bedtime if needed for sleep Please do our urine drug screen at the lab on your way out today.   You do NOT have to pay any invoice that comes to you- if questions call [facility] at [phone]  Please come and see me in 4 months to check on how you are doing- sooner if you have any concerns.  Let me know when you need more hydrocodone- if we have a little lead time I can mail you the rx to save a trip

## 2016-06-05 NOTE — Progress Notes (Signed)
[place] [facility] [address] [place], [place] [address] [phone]  Date:  06/05/2016   Name:  Dawn Powers   DOB:  [birth year]   MRN:  [id number]  PCP:  [name], MD    Chief Complaint: Establish Care (c/o clicking sounds in ears x 1 week. would like to discuss pain management. Trouble falling asleep and waking up every 44mns would like to possbily up dose of Trazodone. )   History of Present Illness:  ADENAISHA SWANGOis a 70y.o. very pleasant female patient who presents with the following:  Seen by myself about 4 years ago.  Here today to establish care and discuss a few concerns.  She was dx with endometrial cancer last fall. She finished radiation, and is currently followed every 3 months by gyn and gyn oncology.  Her outlook is good    She did surgery and then radiation, no chemo.  She is frustrated as she has gained some weight since this treatment but understands having a few extra lbs during illness can be a good thing  She notes that she is having a hard time sleeping.  She will try and "rock myself to sleep," and can get to sleep but then will wake up frequently.  She has an rx for up to 50 mg of trazodone; she increased to 75 mg which worked for her.  She just wanted to be sure that this is ok to take She has a history of chronic back pain and "it spasms so bad that I can't function."  She has had this issue for 3.5 years.   She has had an MRI, and has seen ortho, done epidural injections, acupuncture.    unfortunately nothing has helped her that much. Her pain will wax and wane; she is often ok, but then can be struck but a sudden painful spasm Tai chi and heat do help her some, and she sometimes uses hydrocodone.   She may take 0 - 3 hydrocodone a day.   Would like to know if we can continue to rx this for her as her PCP Dr. DEverlene Farrieris retiring.  She is worried that she will not be able to find anyone willing to rx  her hydrocodone  [facility] review- she filled 60 hydrocodone '5mg'$  4/21, and another 60 8/15.  No other entries  She has noted a "tapping sensation" in her bilateral ears over the last week or so, more so on the right Hearing seems ok, no pain. She has not noted this sound today however   Patient Active Problem List   Diagnosis Date Noted  . Endometrial cancer ([facility] 09/26/2015  . Essential hypertension 05/23/2015  . Acquired scoliosis 04/27/2014  . Thoracic facet joint syndrome 04/27/2014  . Back pain 03/24/2014  . Chronic pain syndrome 03/24/2014  . Scoliosis (and kyphoscoliosis), idiopathic 12/22/2012  . Osteopenia 12/22/2012  . Other and unspecified hyperlipidemia 12/22/2012    Past Medical History:  Diagnosis Date  . Arthritis   . Cancer ([facility]    Endometrial   . Heart murmur   . Hyperlipidemia   . Pain    lower back -hx "DDD"  . Radiation 11/15/15, 11/23/15, 11/30/15, 12/07/15, 12/14/15   HDR brachytherapy    Past Surgical History:  Procedure Laterality Date  . COLONOSCOPY    . FRACTURE SURGERY Right    ankle  . GANGLION CYST EXCISION    . LYMPH  NODE BIOPSY N/A 09/26/2015   Procedure: SENTINEL LYMPH NODE BIOPSY;  Surgeon: Adolphus Birchwood, MD;  Location: [facility];  Service: Gynecology;  Laterality: N/A;  . ROBOTIC ASSISTED TOTAL HYSTERECTOMY WITH BILATERAL SALPINGO OOPHERECTOMY Bilateral 09/26/2015   Procedure: ROBOTIC ASSISTED TOTAL HYSTERECTOMY WITH BILATERAL SALPINGO OOPHORECTOMY;  Surgeon: Adolphus Birchwood, MD;  Location: [facility];  Service: Gynecology;  Laterality: Bilateral;    Social History  Substance Use Topics  . Smoking status: Former Smoker    Years: 4.00    Types: Cigarettes    Quit date: 09/30/1998  . Smokeless tobacco: Never Used  . Alcohol use 1.8 oz/week    3 Standard drinks or equivalent per week     Comment: WINE AND BOURBON - daily    Family History  Problem Relation Age of Onset  . Heart disease Mother   . Alcohol abuse Mother     breast cancer - questionable    . Dementia Mother   . Alcohol abuse Father   . Prostate cancer Father 101    prostate and colon- unsure of primary   . Kidney cancer Brother   . Alcohol abuse Maternal Grandfather   . Alcohol abuse Sister   . Esophageal cancer Neg Hx   . Stomach cancer Neg Hx   . Rectal cancer Neg Hx     Allergies  Allergen Reactions  . Morphine And Related Hives    Medication list has been reviewed and updated.  Current Outpatient Prescriptions on File Prior to Visit  Medication Sig Dispense Refill  . HYDROcodone-acetaminophen (NORCO) 5-325 MG tablet Take 1-2 tablets by mouth every 4 (four) hours as needed for moderate pain or severe pain. 60 tablet 0  . magnesium oxide (MAG-OX) 400 MG tablet Take 400 mg by mouth daily.    . multivitamin-lutein (OCUVITE-LUTEIN) CAPS capsule Take 1 capsule by mouth daily.    Marland Kitchen Propylene Glycol (SYSTANE BALANCE OP) Apply 1 drop to eye daily as needed (dry eyes).     . psyllium (METAMUCIL) 58.6 % powder Take 1 packet by mouth daily.    . traZODone (DESYREL) 50 MG tablet TAKE 1/2 TO 1 TABLET AT BEDTIME AS NEEDED FOR SLEEP. 30 tablet 11   No current facility-administered medications on file prior to visit.     Review of Systems:  As per HPI- otherwise negative.   Physical Examination: Vitals:   06/05/16 1322  BP: 132/72  Pulse: 63  Temp: 98.4 F (36.9 C)   Vitals:   06/05/16 1322  Weight: 132 lb 6.4 oz (60.1 kg)  Height: 5\' 3"  (1.6 m)   Body mass index is 23.45 kg/m. Ideal Body Weight: Weight in (lb) to have BMI = 25: 140.8  GEN: WDWN, NAD, Non-toxic, A & O x 3, normal weight, looks well HEENT: Atraumatic, Normocephalic. Neck supple. No masses, No LAD.  Bilateral TM wnl, oropharynx normal.  PEERL,EOMI.   Ears and Nose: No external deformity. CV: RRR, No M/G/R. No JVD. No thrill. No extra heart sounds. PULM: CTA B, no wheezes, crackles, rhonchi. No retractions. No resp. distress. No accessory muscle use. ABD: S, NT, ND, +BS. No rebound. No  HSM. EXTR: No c/c/e NEURO Normal gait.  PSYCH: Normally interactive. Conversant. Not depressed or anxious appearing.  Calm demeanor.    Assessment and Plan: Neck pain - Plan: HYDROcodone-acetaminophen (NORCO) 5-325 MG tablet  Scoliosis (and kyphoscoliosis), idiopathic - Plan: HYDROcodone-acetaminophen (NORCO) 5-325 MG tablet  Thoracic back pain, unspecified back pain laterality - Plan: HYDROcodone-acetaminophen (NORCO) 5-325 MG tablet  Encounter for immunization - Plan: Flu vaccine HIGH DOSE PF  Here today to establish care and discuss a few concerns  reassured that her current weight is just fine Refilled her hydrocodone to use for persistent back pain which has not been otherwise controlled Did a contract and UDS today Reassured that is it ok to take 75 mg of trazodone for sleep if needed- she does not need a RF of this today  See patient instructions for more details.      Signed Lamar Blinks, MD

## 2016-06-13 ENCOUNTER — Ambulatory Visit
Admission: RE | Admit: 2016-06-13 | Discharge: 2016-06-13 | Disposition: A | Discharge status: Home / Self Care | Payer: Medicare Other | Source: Ambulatory Visit | Attending: Radiation Oncology | Admitting: Radiation Oncology

## 2016-06-13 ENCOUNTER — Encounter: Payer: Self-pay | Admitting: Radiation Oncology

## 2016-06-13 VITALS — BP 143/79 | HR 61 | Temp 98.5°F | Ht 63.0 in | Wt 132.8 lb

## 2016-06-13 DIAGNOSIS — Z923 Personal history of irradiation: Secondary | ICD-10-CM | POA: Diagnosis not present

## 2016-06-13 DIAGNOSIS — Z8542 Personal history of malignant neoplasm of other parts of uterus: Secondary | ICD-10-CM | POA: Diagnosis not present

## 2016-06-13 DIAGNOSIS — Z08 Encounter for follow-up examination after completed treatment for malignant neoplasm: Secondary | ICD-10-CM | POA: Diagnosis not present

## 2016-06-13 DIAGNOSIS — Z885 Allergy status to narcotic agent status: Secondary | ICD-10-CM | POA: Insufficient documentation

## 2016-06-13 DIAGNOSIS — C541 Malignant neoplasm of endometrium: Secondary | ICD-10-CM

## 2016-06-13 NOTE — Progress Notes (Signed)
Dawn Powers here for follow up.  She denies having pain.  She continues to report having nocturia 3 times per night.  She denies having dysuria or incontinence.  She denies having any bowel issues.  She denies having vaginal/rectal bleeding or discharge.  She is using a vaginal dilator 1-2 times a week.  She reports feeling more tired now than she used to be.  BP (!) 143/79 (BP Location: Right Arm, Patient Position: Sitting)   Pulse 61   Temp 98.5 F (36.9 C) (Oral)   Ht 5\' 3"  (1.6 m)   Wt 132 lb 12.8 oz (60.2 kg)   SpO2 98%   BMI 23.52 kg/m    Wt Readings from Last 3 Encounters:  06/13/16 132 lb 12.8 oz (60.2 kg)  06/05/16 132 lb 6.4 oz (60.1 kg)  04/11/16 132 lb 9.6 oz (60.1 kg)

## 2016-06-13 NOTE — Progress Notes (Signed)
Radiation Oncology         [phone] ________________________________  Name: Dawn Powers MRN: [id number]  Date: 06/13/2016  DOB: 03/22/1946  Follow-Up Visit Note  CC: [name], MD  Everitt Amber, MD    ICD-9-CM ICD-10-CM   1. Endometrial cancer (HCC) 182.0 C54.1     Diagnosis:   Stage IB grade 2 endometrial cancer  Interval Since Last Radiation:  6  months   11/15/2015-12/14/2015: Vaginal cuff 30 gray in 5 fractions area the patient was treated with a 3 cm diameter segmented cylinder. Treatment length 4 cm with prescription to the mucosal surface. Iridium 192 was the high-dose-rate source  Narrative:  The patient returns today for routine follow-up. The patient followed up with Dr. Denman George on 03/18/16 who noted mild radiation changes to the vaginal mucosa on clinical exam.  She denies having pain, dysuria, incontinence, vaginal/rectal bleeding/discharge, or bowel issues. She continues to report having nocturia 3 times per night. She is using a vaginal dilator 1-2 times a week.  She reports feeling more tired now than she used to be.  ALLERGIES:  is allergic to morphine and related.  Meds: Current Outpatient Prescriptions  Medication Sig Dispense Refill  . HYDROcodone-acetaminophen (NORCO) 5-325 MG tablet Take 1-2 tablets by mouth every 4 (four) hours as needed for moderate pain or severe pain. 60 tablet 0  . magnesium oxide (MAG-OX) 400 MG tablet Take 400 mg by mouth daily.    . multivitamin-lutein (OCUVITE-LUTEIN) CAPS capsule Take 1 capsule by mouth daily.    Marland Kitchen Propylene Glycol (SYSTANE BALANCE OP) Apply 1 drop to eye daily as needed (dry eyes).     . psyllium (METAMUCIL) 58.6 % powder Take 1 packet by mouth daily.    . traZODone (DESYREL) 50 MG tablet TAKE 1/2 TO 1 TABLET AT BEDTIME AS NEEDED FOR SLEEP. 30 tablet 11   No current facility-administered medications for this encounter.     Physical Findings: The patient is in no acute distress. Patient is alert and  oriented.  height is 5\' 3"  (1.6 m) and weight is 132 lb 12.8 oz (60.2 kg). Her oral temperature is 98.5 F (36.9 C). Her blood pressure is 143/79 (abnormal) and her pulse is 61. Her oxygen saturation is 98%.   Lungs are clear to auscultation bilaterally. Heart has regular rate and rhythm. No palpable cervical, supraclavicular, or axillary adenopathy. Abdomen soft, non-tender, normal bowel sounds. The patient's port sites is clear without sign of recurrence. No inguinal adenopathy.  On pelvic examination the external genitalia were unremarkable. A speculum exam was performed. Mild radiation changes noted in the proximal vaginal. There are no mucosal lesions noted in the vaginal vault. On bimanual and rectovaginal examination there were no pelvic masses appreciated. Normal sphincter tone.  Lab Findings: Lab Results  Component Value Date   WBC 4.9 04/11/2016   HGB 14.1 04/11/2016   HCT 39.3 04/11/2016   MCV 93.3 04/11/2016   PLT 241 09/27/2015    Radiographic Findings: Mm Screening Breast Tomo Bilateral  Result Date: 05/22/2016 CLINICAL DATA:  Screening. EXAM: 2D DIGITAL SCREENING BILATERAL MAMMOGRAM WITH CAD AND ADJUNCT TOMO COMPARISON:  Previous exam(s). ACR Breast Density Category b: There are scattered areas of fibroglandular density. FINDINGS: There are no findings suspicious for malignancy. Images were processed with CAD. IMPRESSION: No mammographic evidence of malignancy. A result letter of this screening mammogram will be mailed directly to the patient. RECOMMENDATION: Screening mammogram in one year. (Code:SM-B-01Y) BI-RADS CATEGORY  1: Negative. Electronically Signed  By: Dorise Bullion III M.D   On: 05/22/2016 16:33    Impression:    Patient is doing well after her vaginal brachytherapy. No appreciable side effects or toxicities No evidence of recurrence on clinical exam.  Plan:  She will follow up with Dr. Denman George in December. She will follow up in radiation oncology in  March. -----------------------------------  Blair Promise, PhD, MD  This document serves as a record of services personally performed by Gery Pray, MD. It was created on his behalf by Darcus Austin, a trained medical scribe. The creation of this record is based on the scribe's personal observations and the provider's statements to them. This document has been checked and approved by the attending provider.

## 2016-06-19 ENCOUNTER — Ambulatory Visit: Payer: Medicare Other | Admitting: Gynecologic Oncology

## 2016-06-25 ENCOUNTER — Other Ambulatory Visit: Payer: Self-pay | Admitting: Emergency Medicine

## 2016-06-25 NOTE — Telephone Encounter (Signed)
Patient Relation: Self Patient Phone: [phone] Pharmacy:  [facility], [place]  Patient is requesting a refill of traZODone (DESYREL) 50 MG tablet She established care with Dr. Lorelei Pont on 06/05/16. Patient states that she is currently taking 1.5 pills and had wondered if Dr. Lorelei Pont could up the prescription dosage?

## 2016-06-28 MED ORDER — TRAZODONE HCL 50 MG PO TABS: ORAL_TABLET | ORAL | 11 refills | Status: DC

## 2016-06-28 NOTE — Telephone Encounter (Signed)
Called pt- that is fine, will refil. Sorry for delay in reply Tried to update PCP to myself but this will not work for some reason

## 2016-08-16 DIAGNOSIS — L814 Other melanin hyperpigmentation: Secondary | ICD-10-CM | POA: Diagnosis not present

## 2016-08-16 DIAGNOSIS — D235 Other benign neoplasm of skin of trunk: Secondary | ICD-10-CM | POA: Diagnosis not present

## 2016-08-16 DIAGNOSIS — L918 Other hypertrophic disorders of the skin: Secondary | ICD-10-CM | POA: Diagnosis not present

## 2016-08-16 DIAGNOSIS — L821 Other seborrheic keratosis: Secondary | ICD-10-CM | POA: Diagnosis not present

## 2016-08-16 DIAGNOSIS — D1801 Hemangioma of skin and subcutaneous tissue: Secondary | ICD-10-CM | POA: Diagnosis not present

## 2016-08-16 DIAGNOSIS — L219 Seborrheic dermatitis, unspecified: Secondary | ICD-10-CM | POA: Diagnosis not present

## 2016-08-27 ENCOUNTER — Other Ambulatory Visit: Payer: Self-pay | Admitting: Family Medicine

## 2016-08-27 ENCOUNTER — Other Ambulatory Visit: Payer: Self-pay | Admitting: *Deleted

## 2016-08-27 DIAGNOSIS — M4120 Other idiopathic scoliosis, site unspecified: Secondary | ICD-10-CM

## 2016-08-27 DIAGNOSIS — G8929 Other chronic pain: Secondary | ICD-10-CM

## 2016-08-27 DIAGNOSIS — M542 Cervicalgia: Secondary | ICD-10-CM

## 2016-08-27 DIAGNOSIS — M546 Pain in thoracic spine: Secondary | ICD-10-CM

## 2016-08-27 NOTE — Telephone Encounter (Signed)
Pt requesting Hydrocodone refill.

## 2016-08-27 NOTE — Telephone Encounter (Addendum)
Caller: Self  Reason for call: Pt requesting results from UDS in September. Pt notified of results and verbalized understanding. Also requesting refills of Norco and trazodone. Would like Norco refill mailed to her (per last AVS). Pt states it is cheaper for her if provider can rx #90 tablets.  Last OV: 06/05/16 UDS: 06/05/16, positive for Norco, low risk  NORCO Last filled: 06/05/16, #60, 0 RF Sig: Take 1-2 tablets by mouth every 4 (four) hours as needed for moderate pain or severe pain.    TRAZODONE Last filled: 06/28/16, #45, 11 RF Sig: Take 50- 75 mg at bedtime as needed for sleep

## 2016-08-28 MED ORDER — HYDROCODONE-ACETAMINOPHEN 5-325 MG PO TABS: 1.0000 | ORAL_TABLET | Freq: Four times a day (QID) | ORAL | 0 refills | Status: DC | PRN

## 2016-08-28 NOTE — Telephone Encounter (Signed)
Called pt to let her know she can pick up rx- we will mail it to her.  She continues to have back pain

## 2016-09-10 NOTE — Progress Notes (Signed)
FOLLOW UP VISIT  Assessment:    70 y.o. 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   S/p robotic hysterectomy, BSO, sentinel lymph node biopsy on 09/26/15 with high intermediate risk factors for recurrence, s/p vaginal brachytherapy adjuvant therapy completed in March, 2017.  No evidence of recurrence on today's eam Plan: Discussed symptoms concerning for recurrence and recommendation to return to see [place] immediately should they develop.  Return to clinic in 3 months to see Dr Sondra Come and in 6 months to see Dr Willis Modena and in 9 months to see me.  HPI:  Dawn Powers is a 70 y.o. year old initially seen in consultation on 09/08/15 referred by Dr Meissinger for grade 2 endometrial cancer.  She then underwent a robotic hysterectomy, BSO and bilateral SLN biopsy on [id number] without complications.  Her postoperative course was uncomplicated.  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 She was recommended adjuvant vaginal brachytherapy for high/intermediate risk factors.  Interval Hx:  She completed vaginal brachytherapy in March, 2017.  She is seen today for a routine surveillance visit. She has no complaints concerning for recurrence. She is using her vaginal dilator but is not sexually active. No bleeding.  Past Medical History:  Diagnosis Date  . Arthritis   . Cancer [facility])    Endometrial   . Heart murmur   . Hyperlipidemia   . Pain    lower back -hx "DDD"  . Radiation 11/15/15, 11/23/15, 11/30/15, 12/07/15, 12/14/15   HDR brachytherapy   Past Surgical History:  Procedure Laterality Date  . COLONOSCOPY    . FRACTURE SURGERY Right    ankle  . GANGLION CYST EXCISION    . LYMPH NODE BIOPSY N/A 09/26/2015   Procedure: SENTINEL LYMPH NODE BIOPSY;  Surgeon: Everitt Amber, MD;  Location: [facility];  Service: Gynecology;  Laterality:  N/A;  . ROBOTIC ASSISTED TOTAL HYSTERECTOMY WITH BILATERAL SALPINGO OOPHERECTOMY Bilateral 09/26/2015   Procedure: ROBOTIC ASSISTED TOTAL HYSTERECTOMY WITH BILATERAL SALPINGO OOPHORECTOMY;  Surgeon: Everitt Amber, MD;  Location: [facility];  Service: Gynecology;  Laterality: Bilateral;   Family History  Problem Relation Age of Onset  . Heart disease Mother   . Alcohol abuse Mother     breast cancer - questionable   . Dementia Mother   . Alcohol abuse Father   . Prostate cancer Father 20    prostate and colon- unsure of primary   . Kidney cancer Brother   . Alcohol abuse Maternal Grandfather   . Alcohol abuse Sister   . Esophageal cancer Neg Hx   . Stomach cancer Neg Hx   . Rectal cancer Neg Hx    Social History   Social History  . Marital status: Married    Spouse name: N/A  . Number of children: 2  . Years of education: N/A   Occupational History  . Not on file.   Social History Main Topics  . Smoking status: Former Smoker    Years: 4.00    Types: Cigarettes    Quit date: 09/30/1998  . Smokeless tobacco: Never Used  . Alcohol use 1.8 oz/week    3 Standard drinks or equivalent per week     Comment: [place] - daily  . Drug use: No  . Sexual activity: Yes    Partners: Female    Birth control/ protection: None   Other Topics  Concern  . Not on file   Social History Narrative   Married. Education: college and Other. Exercise 4 times a week for 50 minutes.   Current Outpatient Prescriptions on File Prior to Visit  Medication Sig Dispense Refill  . HYDROcodone-acetaminophen (NORCO) 5-325 MG tablet Take 1-2 tablets by mouth every 6 (six) hours as needed for moderate pain or severe pain. 90 tablet 0  . magnesium oxide (MAG-OX) 400 MG tablet Take 400 mg by mouth daily.    Marland Kitchen Propylene Glycol (SYSTANE BALANCE OP) Apply 1 drop to eye daily as needed (dry eyes).     . traZODone (DESYREL) 50 MG tablet Take 50- 75 mg at bedtime as needed for sleep 45 tablet 11  .  multivitamin-lutein (OCUVITE-LUTEIN) CAPS capsule Take 1 capsule by mouth daily.    . psyllium (METAMUCIL) 58.6 % powder Take 1 packet by mouth daily.     No current facility-administered medications on file prior to visit.    Allergies  Allergen Reactions  . Morphine And Related Hives      Review of systems: Constitutional:  She has no weight gain or weight loss. She has no fever or chills. Eyes: No blurred vision Ears, Nose, Mouth, Throat: No dizziness, headaches or changes in hearing. No mouth sores. Cardiovascular: No chest pain, palpitations or edema. Respiratory:  No shortness of breath, wheezing or cough Gastrointestinal: She has normal bowel movements without diarrhea or constipation. She denies any nausea or vomiting. She denies blood in her stool or heart burn. Genitourinary:  She denies pelvic pain, pelvic pressure or changes in her urinary function. She has no hematuria, dysuria, or incontinence. She has no irregular vaginal bleeding or vaginal discharge Musculoskeletal: Denies muscle weakness or joint pains.  Skin:  She has no skin changes, rashes or itching Neurological: + chronic neuropathic back pain. Psychiatric:  She denies depression or anxiety. Hematologic/Lymphatic:   No easy bruising or bleeding   Physical Exam: Blood pressure (!) 155/65, pulse 64, temperature 98 F (36.7 C), temperature source Oral, resp. rate 18, height 5\' 3"  (1.6 m), weight 131 lb 8 oz (59.6 kg), SpO2 100 %. General: Well dressed, well nourished in no apparent distress.   HEENT:  Normocephalic and atraumatic, no lesions.  Extraocular muscles intact. Sclerae anicteric. Pupils equal, round, reactive. No mouth sores or ulcers. Thyroid is normal size, not nodular, midline. Abdomen:  Soft, nontender, nondistended.  No palpable masses.  No hepatosplenomegaly.  No ascites. Normal bowel sounds.  No hernias.  Incisions are well healed Genitourinary: Normal EGBUS  Vaginal cuff intact.  No bleeding or  discharge.  No cul de sac fullness. Mild radiation changes to vaginal mucosa. Extremities: No cyanosis, clubbing or edema.  No calf tenderness or erythema. No palpable cords. Psychiatric: Mood and affect are appropriate. Neurological: Awake, alert and oriented x 3. Sensation is intact, no neuropathy.  Musculoskeletal: No pain, normal strength and range of motion.  Donaciano Eva, MD

## 2016-09-11 ENCOUNTER — Ambulatory Visit: Payer: Medicare Other | Attending: Gynecologic Oncology | Admitting: Gynecologic Oncology

## 2016-09-11 ENCOUNTER — Encounter: Payer: Self-pay | Admitting: Gynecologic Oncology

## 2016-09-11 VITALS — BP 155/65 | HR 64 | Temp 98.0°F | Resp 18 | Ht 63.0 in | Wt 131.5 lb

## 2016-09-11 DIAGNOSIS — Z8542 Personal history of malignant neoplasm of other parts of uterus: Secondary | ICD-10-CM | POA: Diagnosis not present

## 2016-09-11 DIAGNOSIS — M1990 Unspecified osteoarthritis, unspecified site: Secondary | ICD-10-CM | POA: Diagnosis not present

## 2016-09-11 DIAGNOSIS — E785 Hyperlipidemia, unspecified: Secondary | ICD-10-CM | POA: Insufficient documentation

## 2016-09-11 DIAGNOSIS — C541 Malignant neoplasm of endometrium: Secondary | ICD-10-CM | POA: Diagnosis not present

## 2016-09-11 DIAGNOSIS — R011 Cardiac murmur, unspecified: Secondary | ICD-10-CM | POA: Insufficient documentation

## 2016-09-11 DIAGNOSIS — Z87891 Personal history of nicotine dependence: Secondary | ICD-10-CM | POA: Diagnosis not present

## 2016-09-11 DIAGNOSIS — Z90710 Acquired absence of both cervix and uterus: Secondary | ICD-10-CM | POA: Diagnosis not present

## 2016-09-11 NOTE — Patient Instructions (Addendum)
You will follow up with your healthcare team ever 3 months for 2 years after treatment. You will see Dr. Sondra Come in 3 months. Please call our clinic sooner if you have any new questions or concerns.

## 2016-10-02 DIAGNOSIS — H43813 Vitreous degeneration, bilateral: Secondary | ICD-10-CM | POA: Diagnosis not present

## 2016-10-02 DIAGNOSIS — H18453 Nodular corneal degeneration, bilateral: Secondary | ICD-10-CM | POA: Diagnosis not present

## 2016-10-02 DIAGNOSIS — H2513 Age-related nuclear cataract, bilateral: Secondary | ICD-10-CM | POA: Diagnosis not present

## 2016-10-02 DIAGNOSIS — H04123 Dry eye syndrome of bilateral lacrimal glands: Secondary | ICD-10-CM | POA: Diagnosis not present

## 2016-10-09 ENCOUNTER — Encounter: Payer: Medicare Other | Admitting: Family Medicine

## 2016-10-11 ENCOUNTER — Telehealth: Payer: Self-pay | Admitting: Family Medicine

## 2016-10-11 NOTE — Telephone Encounter (Signed)
Patient called stating that she is currently in [place] and she thinks she picked something up on the plane ride. She has a tickle in her throat, pressure in her head and behind her eyes. She is coughing and is unable to sleep. The cough is painful. She has a plane ride home on Monday. She is curious as to what she should take OTC, especially what is good for the headache. Please advise  Phone: [phone] (can leave a message)

## 2016-10-11 NOTE — Telephone Encounter (Signed)
Called her back- she is in [place] and has what sounds like a cold or other viral illness.  They have tried some OTC meds that helped.  Ad[facility] if she needs anything further but she feels like she is doing ok

## 2016-11-06 ENCOUNTER — Ambulatory Visit (INDEPENDENT_AMBULATORY_CARE_PROVIDER_SITE_OTHER): Payer: Medicare Other | Admitting: Family Medicine

## 2016-11-06 ENCOUNTER — Encounter: Payer: Self-pay | Admitting: Family Medicine

## 2016-11-06 VITALS — BP 138/70 | HR 60 | Temp 97.6°F | Ht 63.0 in | Wt 128.8 lb

## 2016-11-06 DIAGNOSIS — M542 Cervicalgia: Secondary | ICD-10-CM | POA: Diagnosis not present

## 2016-11-06 DIAGNOSIS — E785 Hyperlipidemia, unspecified: Secondary | ICD-10-CM

## 2016-11-06 DIAGNOSIS — M8580 Other specified disorders of bone density and structure, unspecified site: Secondary | ICD-10-CM | POA: Diagnosis not present

## 2016-11-06 DIAGNOSIS — M546 Pain in thoracic spine: Secondary | ICD-10-CM

## 2016-11-06 DIAGNOSIS — G8929 Other chronic pain: Secondary | ICD-10-CM | POA: Diagnosis not present

## 2016-11-06 DIAGNOSIS — M4120 Other idiopathic scoliosis, site unspecified: Secondary | ICD-10-CM | POA: Diagnosis not present

## 2016-11-06 DIAGNOSIS — Z0000 Encounter for general adult medical examination without abnormal findings: Secondary | ICD-10-CM | POA: Diagnosis not present

## 2016-11-06 DIAGNOSIS — Z131 Encounter for screening for diabetes mellitus: Secondary | ICD-10-CM

## 2016-11-06 DIAGNOSIS — Z5181 Encounter for therapeutic drug level monitoring: Secondary | ICD-10-CM | POA: Diagnosis not present

## 2016-11-06 DIAGNOSIS — E2839 Other primary ovarian failure: Secondary | ICD-10-CM

## 2016-11-06 DIAGNOSIS — Z130 Encounter for screening for diseases of the blood and blood-forming organs and certain disorders involving the immune mechanism: Secondary | ICD-10-CM | POA: Diagnosis not present

## 2016-11-06 DIAGNOSIS — G4700 Insomnia, unspecified: Secondary | ICD-10-CM

## 2016-11-06 LAB — POCT URINALYSIS DIP (MANUAL ENTRY)
Bilirubin, UA: NEGATIVE
Blood, UA: NEGATIVE
Glucose, UA: NEGATIVE
Ketones, POC UA: NEGATIVE
Leukocytes, UA: NEGATIVE
Nitrite, UA: NEGATIVE
Protein Ur, POC: NEGATIVE
Spec Grav, UA: 1.015
Urobilinogen, UA: NEGATIVE
pH, UA: 6

## 2016-11-06 MED ORDER — HYDROCODONE-ACETAMINOPHEN 5-325 MG PO TABS: 1.0000 | ORAL_TABLET | Freq: Four times a day (QID) | ORAL | 0 refills | Status: DC | PRN

## 2016-11-06 MED ORDER — LIDOCAINE 5 % EX PTCH: 1.0000 | MEDICATED_PATCH | CUTANEOUS | 0 refills | Status: DC

## 2016-11-06 MED ORDER — TRAZODONE HCL 50 MG PO TABS: ORAL_TABLET | ORAL | 11 refills | Status: DC

## 2016-11-06 NOTE — Progress Notes (Signed)
Pre visit review using our clinic review tool, if applicable. No additional management support is needed unless otherwise documented below in the visit note. 

## 2016-11-06 NOTE — Patient Instructions (Addendum)
Try the lidocaine patches for your back pain I refilled your pain medication and trazodone today We will get you set up for a bone density scan  I will be in touch with your labs asap  Dr. Tamera Punt is still at [facility]- if you would like to see him about your shoulder Address: [address] [place], [place] [address]  Phone: [phone]

## 2016-11-06 NOTE — Progress Notes (Signed)
[facility] [place], [place], [place] [address] [phone]  Date:  11/06/2016   Name:  Dawn Powers   DOB:  1946-05-03   MRN:  [id number]  PCP:  Lamar Blinks, MD    Chief Complaint: Annual Exam (Pt here for CPE and fasting labs. )   History of Present Illness:  Dawn Powers is a 71 y.o. very pleasant female patient who presents with the following:  Here today for a CPE History of endometrial cancer s/p hysterectomy 12/16, HTN, osteopenia, chronic pain, hyperlipidemia She last saw GYN onc in December- all stable   Last labs in July of last year - CMP, CBC, cholesterol  She notes that she continues to have a lot of trouble with her back spasms The pain is in her mid back on the right side, medial to the shoulder blade.  Always in the same plase She will use narcotic pain medications as needed- hydrocodone This has affected her life a lot- she is not able to travel, cannot keep plans some of the time due to her pain She did NOT  see DThereasa Parkin with [facility] through [facility]- she never got an appt Rolling her back on a hard surface can stop the pain.   She does use hydrocodone as needed  The pain is just in one particular spot- she has not tried a lidocaine patch yet  Last mammo was in August of 2017- ok She did have a bone density in 2010  She had a bad GI illness a couple of weeks ago- now resolved  Indication for chronic opioid: chronic back pain Medication and dose: 5 mg hydrcodone 1-2 every 6 hours as needed # pills per month: 90 but does not fill every month Last UDS date: 06/05/16 Pain contract signed (Y/N): yes Date narcotic database last reviewed (include red flags): today Last fill of hydrocone #90 on 12/05, prior to that 10/04  She did see [facility]- Tamera Punt- about 5 years ago for her shoulder.  She would like to follow-up and see him again for persistent left shoulder pain  She has mildly  elevated total cholesterol but very good HDL at last check  Patient Active Problem List   Diagnosis Date Noted  . Endometrial cancer ([facility]) 09/26/2015  . Essential hypertension 05/23/2015  . Acquired scoliosis 04/27/2014  . Thoracic facet joint syndrome 04/27/2014  . Back pain 03/24/2014  . Chronic pain syndrome 03/24/2014  . Scoliosis (and kyphoscoliosis), idiopathic 12/22/2012  . Osteopenia 12/22/2012  . Other and unspecified hyperlipidemia 12/22/2012    Past Medical History:  Diagnosis Date  . Arthritis   . Cancer [facility])    Endometrial   . Heart murmur   . Hyperlipidemia   . Pain    lower back -hx "DDD"  . Radiation 11/15/15, 11/23/15, 11/30/15, 12/07/15, 12/14/15   HDR brachytherapy    Past Surgical History:  Procedure Laterality Date  . COLONOSCOPY    . FRACTURE SURGERY Right    ankle  . GANGLION CYST EXCISION    . LYMPH NODE BIOPSY N/A 09/26/2015   Procedure: SENTINEL LYMPH NODE BIOPSY;  Surgeon: Everitt Amber, MD;  Location: [facility];  Service: Gynecology;  Laterality: N/A;  . ROBOTIC ASSISTED TOTAL HYSTERECTOMY WITH BILATERAL SALPINGO OOPHERECTOMY Bilateral 09/26/2015   Procedure: ROBOTIC ASSISTED TOTAL HYSTERECTOMY WITH BILATERAL SALPINGO OOPHORECTOMY;  Surgeon: Everitt Amber, MD;  Location: [facility];  Service: Gynecology;  Laterality: Bilateral;  Social History  Substance Use Topics  . Smoking status: Former Smoker    Years: 4.00    Types: Cigarettes    Quit date: 09/30/1998  . Smokeless tobacco: Never Used  . Alcohol use 1.8 oz/week    3 Standard drinks or equivalent per week     Comment: [place] - daily    Family History  Problem Relation Age of Onset  . Heart disease Mother   . Alcohol abuse Mother     breast cancer - questionable   . Dementia Mother   . Alcohol abuse Father   . Prostate cancer Father 64    prostate and colon- unsure of primary   . Kidney cancer Brother   . Alcohol abuse Maternal Grandfather   . Alcohol abuse Sister   . Esophageal  cancer Neg Hx   . Stomach cancer Neg Hx   . Rectal cancer Neg Hx     Allergies  Allergen Reactions  . Morphine And Related Hives    Medication list has been reviewed and updated.  Current Outpatient Prescriptions on File Prior to Visit  Medication Sig Dispense Refill  . HYDROcodone-acetaminophen (NORCO) 5-325 MG tablet Take 1-2 tablets by mouth every 6 (six) hours as needed for moderate pain or severe pain. 90 tablet 0  . magnesium oxide (MAG-OX) 400 MG tablet Take 400 mg by mouth daily.    . multivitamin-lutein (OCUVITE-LUTEIN) CAPS capsule Take 1 capsule by mouth daily.    Marland Kitchen Propylene Glycol (SYSTANE BALANCE OP) Apply 1 drop to eye daily as needed (dry eyes).     . psyllium (METAMUCIL) 58.6 % powder Take 1 packet by mouth daily.    . traZODone (DESYREL) 50 MG tablet Take 50- 75 mg at bedtime as needed for sleep 45 tablet 11   No current facility-administered medications on file prior to visit.     Review of Systems:  As per HPI- otherwise negative.   Physical Examination: Vitals:   11/06/16 1400  BP: (!) 149/69  Pulse: 60  Temp: 97.6 F (36.4 C)   Vitals:   11/06/16 1400  Weight: 128 lb 12.8 oz (58.4 kg)  Height: 5\' 3"  (1.6 m)   Body mass index is 22.82 kg/m. Ideal Body Weight: Weight in (lb) to have BMI = 25: 140.8  GEN: WDWN, NAD, Non-toxic, A & O x 3, slim build, looks well HEENT: Atraumatic, Normocephalic. Neck supple. No masses, No LAD.  Bilateral TM wnl, oropharynx normal.  PEERL,EOMI.   Ears and Nose: No external deformity. CV: RRR, No M/G/R. No JVD. No thrill. No extra heart sounds. PULM: CTA B, no wheezes, crackles, rhonchi. No retractions. No resp. distress. No accessory muscle use. ABD: S, NT, ND EXTR: No c/c/e NEURO Normal gait.  PSYCH: Normally interactive. Conversant. Not depressed or anxious appearing.  Calm demeanor.    Assessment and Plan: Physical exam - Plan: POCT urinalysis dipstick  Neck pain - Plan: HYDROcodone-acetaminophen (NORCO)  5-325 MG tablet, lidocaine (LIDODERM) 5 %  Scoliosis (and kyphoscoliosis), idiopathic - Plan: HYDROcodone-acetaminophen (NORCO) 5-325 MG tablet  Chronic bilateral thoracic back pain - Plan: HYDROcodone-acetaminophen (NORCO) 5-325 MG tablet  Insomnia, unspecified type - Plan: traZODone (DESYREL) 50 MG tablet  Dyslipidemia - Plan: Lipid panel  Osteopenia, unspecified location - Plan: DG Bone Density  Medication monitoring encounter - Plan: CBC, Comprehensive metabolic panel  Screening for deficiency anemia - Plan: CBC  Screening for diabetes mellitus - Plan: Comprehensive metabolic panel  Estrogen deficiency - Plan: DG Bone Density  Here today to discuss several concerns She continues to have back pain that is really bothersome to her.  She has not tried lidocaine patches- she will try these and let me know how they work for her.  Discussed how to use these Refills as above Labs pending- will follow-up with her pending results See patient instructions for more details.     Signed Lamar Blinks, MD

## 2016-11-07 LAB — COMPREHENSIVE METABOLIC PANEL
ALT: 13 U/L (ref 0–35)
AST: 21 U/L (ref 0–37)
Albumin: 4.5 g/dL (ref 3.5–5.2)
Alkaline Phosphatase: 51 U/L (ref 39–117)
BUN: 13 mg/dL (ref 6–23)
CO2: 30 mEq/L (ref 19–32)
Calcium: 9.5 mg/dL (ref 8.4–10.5)
Chloride: 104 mEq/L (ref 96–112)
Creatinine, Ser: 0.83 mg/dL (ref 0.40–1.20)
GFR: 71.7 mL/min (ref >60.00)
Glucose, Bld: 82 mg/dL (ref 70–99)
Potassium: 4.1 mEq/L (ref 3.5–5.1)
Sodium: 140 mEq/L (ref 135–145)
Total Bilirubin: 0.6 mg/dL (ref 0.2–1.2)
Total Protein: 7.1 g/dL (ref 6.0–8.3)

## 2016-11-07 LAB — LIPID PANEL
Cholesterol: 205 mg/dL — ABNORMAL HIGH (ref 0–200)
HDL: 74.6 mg/dL (ref >39.00)
LDL Cholesterol: 116 mg/dL — ABNORMAL HIGH (ref 0–99)
NonHDL: 129.95
Total CHOL/HDL Ratio: 3
Triglycerides: 69 mg/dL (ref 0.0–149.0)
VLDL: 13.8 mg/dL (ref 0.0–40.0)

## 2016-11-07 LAB — CBC
HCT: 40.2 % (ref 36.0–46.0)
Hemoglobin: 13.4 g/dL (ref 12.0–15.0)
MCHC: 33.4 g/dL (ref 30.0–36.0)
MCV: 97 fl (ref 78.0–100.0)
Platelets: 237 10*3/uL (ref 150.0–400.0)
RBC: 4.14 Mil/uL (ref 3.87–5.11)
RDW: 13.3 % (ref 11.5–15.5)
WBC: 4.5 10*3/uL (ref 4.0–10.5)

## 2016-11-14 ENCOUNTER — Encounter: Payer: Self-pay | Admitting: Family Medicine

## 2016-11-14 ENCOUNTER — Telehealth: Payer: Self-pay | Admitting: Family Medicine

## 2016-11-14 NOTE — Telephone Encounter (Signed)
Caller name: Relationship to patient: Self Can be reached: [phone] Pharmacy:  Reason for call: Patient has follow up questions about the Bone Density test she had last week.

## 2016-11-14 NOTE — Telephone Encounter (Signed)
Sent pt a [employer] as I am out of town

## 2016-11-19 ENCOUNTER — Telehealth: Payer: Self-pay | Admitting: Emergency Medicine

## 2016-11-19 NOTE — Telephone Encounter (Signed)
Dawn Powers ([name]) - [id number] Lidocaine 5% patches Status: PA Response - Pending Created: February 20th, 2018 Sent: February 20th, 2018  Awaiting determination.

## 2016-11-26 ENCOUNTER — Encounter: Payer: Self-pay | Admitting: Family Medicine

## 2016-11-27 ENCOUNTER — Encounter: Payer: Self-pay | Admitting: Family Medicine

## 2016-11-29 ENCOUNTER — Encounter: Payer: Self-pay | Admitting: Family Medicine

## 2016-11-29 NOTE — Telephone Encounter (Signed)
Called her back- she had several questions/ concerns about her recent physical including why I didn't check her vitamin D, do a clinical breast exam or do a skin exam, or check her reflexes.  She was also upset that I did not go over what the plan would be if she did have an abnormal dexa scan including going over possible treatment options and the various risks and benefits of these treatments.    Of note we spent a good portion of her recent CPE visit discussing her chronic neck and back pain for which she uses chronic hydrocodone and trying to find a new solution for her pain.    Explained to her that routine vitamin D testing, clinical breast exam (beyond mammo) and routine skin exam by PCP are not recommended by the USPSTF, and I will send her more information about this in the mail.

## 2016-12-04 DIAGNOSIS — M19012 Primary osteoarthritis, left shoulder: Secondary | ICD-10-CM | POA: Diagnosis not present

## 2016-12-09 ENCOUNTER — Encounter: Payer: Self-pay | Admitting: Family Medicine

## 2016-12-09 ENCOUNTER — Ambulatory Visit: Payer: Medicare Other | Admitting: Radiation Oncology

## 2016-12-09 DIAGNOSIS — M549 Dorsalgia, unspecified: Principal | ICD-10-CM

## 2016-12-09 DIAGNOSIS — G8929 Other chronic pain: Secondary | ICD-10-CM

## 2016-12-11 ENCOUNTER — Ambulatory Visit: Payer: Medicare Other | Admitting: Gynecologic Oncology

## 2016-12-12 ENCOUNTER — Ambulatory Visit: Payer: Self-pay | Admitting: Radiation Oncology

## 2016-12-12 ENCOUNTER — Encounter: Payer: Self-pay | Admitting: Radiation Oncology

## 2016-12-12 ENCOUNTER — Ambulatory Visit
Admission: RE | Admit: 2016-12-12 | Discharge: 2016-12-12 | Disposition: A | Discharge status: Home / Self Care | Payer: Medicare Other | Source: Ambulatory Visit | Attending: Radiation Oncology | Admitting: Radiation Oncology

## 2016-12-12 VITALS — BP 141/60 | HR 71 | Temp 98.5°F | Ht 63.0 in | Wt 129.4 lb

## 2016-12-12 DIAGNOSIS — Z8542 Personal history of malignant neoplasm of other parts of uterus: Secondary | ICD-10-CM | POA: Diagnosis not present

## 2016-12-12 DIAGNOSIS — Z08 Encounter for follow-up examination after completed treatment for malignant neoplasm: Secondary | ICD-10-CM | POA: Diagnosis not present

## 2016-12-12 DIAGNOSIS — C541 Malignant neoplasm of endometrium: Secondary | ICD-10-CM

## 2016-12-12 NOTE — Progress Notes (Signed)
Dawn Powers is here for follow up.  She reports she has chronic middle back pain and will have to have her left shoulder replaced.  She denies having any bladder/bowel issues or vaginal bleeding.  She reports having a good appetite and said her energy level is not what it used to be.  BP (!) 141/60 (BP Location: Right Arm, Patient Position: Sitting)   Pulse 71   Temp 98.5 F (36.9 C) (Oral)   Ht 5\' 3"  (1.6 m)   Wt 129 lb 6.4 oz (58.7 kg)   SpO2 98%   BMI 22.92 kg/m    Wt Readings from Last 3 Encounters:  12/12/16 129 lb 6.4 oz (58.7 kg)  11/06/16 128 lb 12.8 oz (58.4 kg)  09/11/16 131 lb 8 oz (59.6 kg)

## 2016-12-12 NOTE — Progress Notes (Signed)
  Radiation Oncology         [phone] ________________________________  Name: Dawn Powers MRN: [id number]  Date: 12/12/2016  DOB: June 13, 1946  Follow-Up Visit Note  CC: Lamar Blinks, MD  Everitt Amber, MD    ICD-9-CM ICD-10-CM   1. Endometrial cancer (HCC) 182.0 C54.1     Diagnosis:   Stage IB grade 2 endometrial cancer  Interval Since Last Radiation:  1 year   11/15/2015-12/14/2015: Vaginal cuff 30 gray in 5 fractions area the patient was treated with a 3 cm diameter segmented cylinder. Treatment length 4 cm with prescription to the mucosal surface. Iridium 192 was the high-dose-rate source  Narrative:  The patient returns today for routine follow-up. The patient followed up with Dr. Denman George on 09/11/16 who noted mild radiation changes to the vaginal mucosa on clinical exam.  She denies having pain, dysuria, incontinence, vaginal/rectal bleeding/discharge, or bowel issues. She continues to report having nocturia 3 times per night. She is using a vaginal dilator 1-2 times a week.  She reports feeling more tired now than she used to be.  ALLERGIES:  is allergic to morphine and related.  Meds: Current Outpatient Prescriptions  Medication Sig Dispense Refill  . HYDROcodone-acetaminophen (NORCO) 5-325 MG tablet Take 1-2 tablets by mouth every 6 (six) hours as needed for moderate pain or severe pain. 90 tablet 0  . multivitamin-lutein (OCUVITE-LUTEIN) CAPS capsule Take 1 capsule by mouth daily.    Marland Kitchen Propylene Glycol (SYSTANE BALANCE OP) Apply 1 drop to eye daily as needed (dry eyes).     . traZODone (DESYREL) 50 MG tablet Take 50- 75 mg at bedtime as needed for sleep 45 tablet 11   No current facility-administered medications for this encounter.     Physical Findings: The patient is in no acute distress. Patient is alert and oriented.  height is 5\' 3"  (1.6 m) and weight is 129 lb 6.4 oz (58.7 kg). Her oral temperature is 98.5 F (36.9 C). Her blood pressure is 141/60 (abnormal)  and her pulse is 71. Her oxygen saturation is 98%.   Lungs are clear to auscultation bilaterally. Heart has regular rate and rhythm. No palpable cervical, supraclavicular, or axillary adenopathy. Abdomen soft, non-tender, normal bowel sounds. On pelvic examination the external genitalia were unremarkable. A speculum exam was performed. Mild radiation changes noted in the proximal vaginal. There are no mucosal lesions noted in the vaginal vault. On bimanual and rectovaginal examination there were no pelvic masses appreciated. Normal sphincter tone.  Lab Findings: Lab Results  Component Value Date   WBC 4.5 11/06/2016   HGB 13.4 11/06/2016   HCT 40.2 11/06/2016   MCV 97.0 11/06/2016   PLT 237.0 11/06/2016    Radiographic Findings: No results found.  Impression:    Patient is doing well after her vaginal brachytherapy. No appreciable side effects or toxicities No evidence of recurrence on clinical exam.  Plan:  She will follow up with Dr Willis Modena In June. She will see Dr. Denman George in September and will follow-up in our department in December of this year -----------------------------------  Blair Promise, PhD, MD  This document serves as a record of services personally performed by Gery Pray, MD. It was created on his behalf by Bethann Humble, a trained medical scribe. The creation of this record is based on the scribe's personal observations and the provider's statements to them. This document has been checked and approved by the attending provider.

## 2016-12-19 DIAGNOSIS — M545 Low back pain: Secondary | ICD-10-CM | POA: Diagnosis not present

## 2016-12-19 DIAGNOSIS — M419 Scoliosis, unspecified: Secondary | ICD-10-CM | POA: Diagnosis not present

## 2016-12-24 DIAGNOSIS — M545 Low back pain: Secondary | ICD-10-CM | POA: Diagnosis not present

## 2016-12-24 DIAGNOSIS — M419 Scoliosis, unspecified: Secondary | ICD-10-CM | POA: Diagnosis not present

## 2017-01-02 DIAGNOSIS — M545 Low back pain: Secondary | ICD-10-CM | POA: Diagnosis not present

## 2017-01-02 DIAGNOSIS — M419 Scoliosis, unspecified: Secondary | ICD-10-CM | POA: Diagnosis not present

## 2017-01-09 DIAGNOSIS — M545 Low back pain: Secondary | ICD-10-CM | POA: Diagnosis not present

## 2017-01-09 DIAGNOSIS — M419 Scoliosis, unspecified: Secondary | ICD-10-CM | POA: Diagnosis not present

## 2017-01-15 DIAGNOSIS — M545 Low back pain: Secondary | ICD-10-CM | POA: Diagnosis not present

## 2017-01-15 DIAGNOSIS — M419 Scoliosis, unspecified: Secondary | ICD-10-CM | POA: Diagnosis not present

## 2017-01-21 DIAGNOSIS — M419 Scoliosis, unspecified: Secondary | ICD-10-CM | POA: Diagnosis not present

## 2017-01-21 DIAGNOSIS — M545 Low back pain: Secondary | ICD-10-CM | POA: Diagnosis not present

## 2017-02-06 DIAGNOSIS — M545 Low back pain: Secondary | ICD-10-CM | POA: Diagnosis not present

## 2017-02-06 DIAGNOSIS — M419 Scoliosis, unspecified: Secondary | ICD-10-CM | POA: Diagnosis not present

## 2017-02-14 DIAGNOSIS — M419 Scoliosis, unspecified: Secondary | ICD-10-CM | POA: Diagnosis not present

## 2017-02-14 DIAGNOSIS — M545 Low back pain: Secondary | ICD-10-CM | POA: Diagnosis not present

## 2017-02-27 DIAGNOSIS — M419 Scoliosis, unspecified: Secondary | ICD-10-CM | POA: Diagnosis not present

## 2017-02-27 DIAGNOSIS — M545 Low back pain: Secondary | ICD-10-CM | POA: Diagnosis not present

## 2017-03-05 ENCOUNTER — Telehealth: Payer: Self-pay | Admitting: Family Medicine

## 2017-03-05 NOTE — Telephone Encounter (Signed)
Called patient to schedule awv. Pt stated that she would like to schedule an annual wellness later this year.

## 2017-03-06 ENCOUNTER — Telehealth: Payer: Self-pay | Admitting: Family Medicine

## 2017-03-06 DIAGNOSIS — G8929 Other chronic pain: Secondary | ICD-10-CM

## 2017-03-06 DIAGNOSIS — M546 Pain in thoracic spine: Secondary | ICD-10-CM

## 2017-03-06 DIAGNOSIS — M542 Cervicalgia: Secondary | ICD-10-CM

## 2017-03-06 DIAGNOSIS — M4120 Other idiopathic scoliosis, site unspecified: Secondary | ICD-10-CM

## 2017-03-06 MED ORDER — HYDROCODONE-ACETAMINOPHEN 5-325 MG PO TABS: 1.0000 | ORAL_TABLET | Freq: Four times a day (QID) | ORAL | 0 refills | Status: DC | PRN

## 2017-03-06 NOTE — Telephone Encounter (Signed)
Pt req mail script to her home address hydrocodone. Please call pt to confirm it has been mailed.

## 2017-03-06 NOTE — Telephone Encounter (Signed)
Reviewed NCCSR: only entry is the 90 hydrocodone I gave her on 11/06/16 Will refill for her Called and let her know- we will mail toher

## 2017-03-10 ENCOUNTER — Telehealth: Payer: Self-pay | Admitting: *Deleted

## 2017-03-10 ENCOUNTER — Telehealth: Payer: Self-pay

## 2017-03-10 DIAGNOSIS — Z8542 Personal history of malignant neoplasm of other parts of uterus: Secondary | ICD-10-CM | POA: Diagnosis not present

## 2017-03-10 NOTE — Telephone Encounter (Signed)
Returned patient's call and scheduled appt for follow up. Patient aware of date/time.

## 2017-03-10 NOTE — Telephone Encounter (Signed)
Faxed Dr. Serita Grit office note from 09-11-16 to Dr. Willis Modena for his office visit 03-28-17.

## 2017-03-13 DIAGNOSIS — M419 Scoliosis, unspecified: Secondary | ICD-10-CM | POA: Diagnosis not present

## 2017-03-13 DIAGNOSIS — M545 Low back pain: Secondary | ICD-10-CM | POA: Diagnosis not present

## 2017-04-25 ENCOUNTER — Telehealth: Payer: Self-pay | Admitting: Family Medicine

## 2017-04-25 NOTE — Telephone Encounter (Signed)
Caller name: Eugene Zeiders Relationship to patient: self Can be reached: [phone]  Reason for call: DOS 11/06/16 was denied by pt ins and she got a bill. Pt was billed for cpe and has MCR primary. Pt should have been scheduled for wellness visit. Pt requesting call back.

## 2017-04-25 NOTE — Telephone Encounter (Signed)
Can you look into this one please? Patient is saying she was denied for 11/06/16

## 2017-04-28 NOTE — Telephone Encounter (Signed)
She was not billed for a cpe on 11/06/16.  She was billed for an office visit level 4.  The dx associated was incorrect.  I will send for charge correction.

## 2017-04-29 NOTE — Telephone Encounter (Signed)
Called patient and left her a messages to call back for update

## 2017-06-16 ENCOUNTER — Ambulatory Visit: Payer: Medicare Other | Attending: Gynecologic Oncology | Admitting: Gynecologic Oncology

## 2017-06-16 ENCOUNTER — Encounter: Payer: Self-pay | Admitting: Gynecologic Oncology

## 2017-06-16 VITALS — BP 137/60 | HR 70 | Temp 97.9°F | Resp 18 | Wt 128.8 lb

## 2017-06-16 DIAGNOSIS — Z811 Family history of alcohol abuse and dependence: Secondary | ICD-10-CM | POA: Insufficient documentation

## 2017-06-16 DIAGNOSIS — Z79891 Long term (current) use of opiate analgesic: Secondary | ICD-10-CM | POA: Diagnosis not present

## 2017-06-16 DIAGNOSIS — Z8051 Family history of malignant neoplasm of kidney: Secondary | ICD-10-CM | POA: Insufficient documentation

## 2017-06-16 DIAGNOSIS — Z803 Family history of malignant neoplasm of breast: Secondary | ICD-10-CM | POA: Diagnosis not present

## 2017-06-16 DIAGNOSIS — Z8542 Personal history of malignant neoplasm of other parts of uterus: Secondary | ICD-10-CM | POA: Diagnosis not present

## 2017-06-16 DIAGNOSIS — Z8042 Family history of malignant neoplasm of prostate: Secondary | ICD-10-CM | POA: Insufficient documentation

## 2017-06-16 DIAGNOSIS — Z885 Allergy status to narcotic agent status: Secondary | ICD-10-CM | POA: Diagnosis not present

## 2017-06-16 DIAGNOSIS — Z9889 Other specified postprocedural states: Secondary | ICD-10-CM | POA: Diagnosis not present

## 2017-06-16 DIAGNOSIS — Z79899 Other long term (current) drug therapy: Secondary | ICD-10-CM | POA: Insufficient documentation

## 2017-06-16 DIAGNOSIS — Z90710 Acquired absence of both cervix and uterus: Secondary | ICD-10-CM | POA: Diagnosis not present

## 2017-06-16 DIAGNOSIS — E785 Hyperlipidemia, unspecified: Secondary | ICD-10-CM | POA: Insufficient documentation

## 2017-06-16 DIAGNOSIS — C541 Malignant neoplasm of endometrium: Secondary | ICD-10-CM | POA: Insufficient documentation

## 2017-06-16 DIAGNOSIS — Z8249 Family history of ischemic heart disease and other diseases of the circulatory system: Secondary | ICD-10-CM | POA: Insufficient documentation

## 2017-06-16 DIAGNOSIS — Z87891 Personal history of nicotine dependence: Secondary | ICD-10-CM | POA: Diagnosis not present

## 2017-06-16 NOTE — Addendum Note (Signed)
Addended by: Baruch Merl on: 06/16/2017 02:23 PM   Modules accepted: Orders

## 2017-06-16 NOTE — Progress Notes (Signed)
FOLLOW UP VISIT  Assessment:    71 y.o. 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   S/p robotic hysterectomy, BSO, sentinel lymph node biopsy on 09/26/15 with high intermediate risk factors for recurrence, s/p vaginal brachytherapy adjuvant therapy completed in March, 2017.  No evidence of recurrence on today's eam Plan: Discussed symptoms concerning for recurrence and recommendation to return to see [place] immediately should they develop.  Return to clinic in 3 months to me. We will transition to 6 monthly exams in March, 2019  HPI:  Dawn Powers is a 70 y.o. year old initially seen in consultation on 09/08/15 referred by Dr Meissinger for grade 2 endometrial cancer.  She then underwent a robotic hysterectomy, BSO and bilateral SLN biopsy on 12/45/80 without complications.  Her postoperative course was uncomplicated.  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 She was recommended adjuvant vaginal brachytherapy for high/intermediate risk factors.  Interval Hx:  She completed vaginal brachytherapy in March, 2017.  She is seen today for a routine surveillance visit. She has no complaints concerning for recurrence. She is using her vaginal dilator but is not sexually active. No bleeding.  Past Medical History:  Diagnosis Date  . Arthritis   . Cancer [facility])    Endometrial   . Heart murmur   . Hyperlipidemia   . Pain    lower back -hx "DDD"  . Radiation 11/15/15, 11/23/15, 11/30/15, 12/07/15, 12/14/15   HDR brachytherapy   Past Surgical History:  Procedure Laterality Date  . COLONOSCOPY    . FRACTURE SURGERY Right    ankle  . GANGLION CYST EXCISION    . LYMPH NODE BIOPSY N/A 09/26/2015   Procedure: SENTINEL LYMPH NODE BIOPSY;  Surgeon: Everitt Amber, MD;  Location: [facility];  Service: Gynecology;  Laterality: N/A;  . ROBOTIC  ASSISTED TOTAL HYSTERECTOMY WITH BILATERAL SALPINGO OOPHERECTOMY Bilateral 09/26/2015   Procedure: ROBOTIC ASSISTED TOTAL HYSTERECTOMY WITH BILATERAL SALPINGO OOPHORECTOMY;  Surgeon: Everitt Amber, MD;  Location: [facility];  Service: Gynecology;  Laterality: Bilateral;   Family History  Problem Relation Age of Onset  . Heart disease Mother   . Alcohol abuse Mother        breast cancer - questionable   . Dementia Mother   . Alcohol abuse Father   . Prostate cancer Father 61       prostate and colon- unsure of primary   . Kidney cancer Brother   . Alcohol abuse Maternal Grandfather   . Alcohol abuse Sister   . Esophageal cancer Neg Hx   . Stomach cancer Neg Hx   . Rectal cancer Neg Hx    Social History   Social History  . Marital status: Married    Spouse name: N/A  . Number of children: 2  . Years of education: N/A   Occupational History  . Not on file.   Social History Main Topics  . Smoking status: Former Smoker    Years: 4.00    Types: Cigarettes    Quit date: 09/30/1998  . Smokeless tobacco: Never Used  . Alcohol use 1.8 oz/week    3 Standard drinks or equivalent per week     Comment: WINE AND BOURBON - daily  . Drug use: No  . Sexual activity: Yes    Partners: Female    Birth control/ protection: None   Other Topics Concern  .  Not on file   Social History Narrative   Married. Education: college and Other. Exercise 4 times a week for 50 minutes.   Current Outpatient Prescriptions on File Prior to Visit  Medication Sig Dispense Refill  . HYDROcodone-acetaminophen (NORCO) 5-325 MG tablet Take 1-2 tablets by mouth every 6 (six) hours as needed for moderate pain or severe pain. 90 tablet 0  . multivitamin-lutein (OCUVITE-LUTEIN) CAPS capsule Take 1 capsule by mouth daily.    Marland Kitchen Propylene Glycol (SYSTANE BALANCE OP) Apply 1 drop to eye daily as needed (dry eyes).     . traZODone (DESYREL) 50 MG tablet Take 50- 75 mg at bedtime as needed for sleep 45 tablet 11   No  current facility-administered medications on file prior to visit.    Allergies  Allergen Reactions  . Morphine And Related Hives      Review of systems: Constitutional:  She has no weight gain or weight loss. She has no fever or chills. Eyes: No blurred vision Ears, Nose, Mouth, Throat: No dizziness, headaches or changes in hearing. No mouth sores. Cardiovascular: No chest pain, palpitations or edema. Respiratory:  No shortness of breath, wheezing or cough Gastrointestinal: She has normal bowel movements without diarrhea or constipation. She denies any nausea or vomiting. She denies blood in her stool or heart burn. Genitourinary:  She denies pelvic pain, pelvic pressure or changes in her urinary function. She has no hematuria, dysuria, or incontinence. She has no irregular vaginal bleeding or vaginal discharge Musculoskeletal: Denies muscle weakness or joint pains.  Skin:  She has no skin changes, rashes or itching Neurological: + chronic neuropathic back pain. Psychiatric:  She denies depression or anxiety. Hematologic/Lymphatic:   No easy bruising or bleeding   Physical Exam: Blood pressure 137/60, pulse 70, temperature 97.9 F (36.6 C), temperature source Oral, resp. rate 18, weight 128 lb 12.8 oz (58.4 kg), SpO2 99 %. General: Well dressed, well nourished in no apparent distress.   HEENT:  Normocephalic and atraumatic, no lesions.  Extraocular muscles intact. Sclerae anicteric. Pupils equal, round, reactive. No mouth sores or ulcers. Thyroid is normal size, not nodular, midline. Abdomen:  Soft, nontender, nondistended.  No palpable masses.  No hepatosplenomegaly.  No ascites. Normal bowel sounds.  No hernias.  Incisions are well healed Genitourinary: Normal EGBUS  Vaginal cuff intact.  No bleeding or discharge.  No cul de sac fullness. Mild radiation changes to vaginal mucosa. Extremities: No cyanosis, clubbing or edema.  No calf tenderness or erythema. No palpable  cords. Psychiatric: Mood and affect are appropriate. Neurological: Awake, alert and oriented x 3. Sensation is intact, no neuropathy.  Musculoskeletal: No pain, normal strength and range of motion.  Donaciano Eva, MD

## 2017-06-16 NOTE — Patient Instructions (Signed)
Please notify Dr Denman George at phone number [phone] if you notice vaginal bleeding, new pelvic or abdominal pains, bloating, feeling full easy, or a change in bladder or bowel function.   Please return to see Dr Denman George as scheduled in December, 2018.

## 2017-06-27 ENCOUNTER — Telehealth: Payer: Self-pay | Admitting: Family Medicine

## 2017-06-27 NOTE — Telephone Encounter (Signed)
Self   Refill for HYDROcodone   Pt says that provider mails Rx to her at her home address.    CB: [phone]

## 2017-06-30 ENCOUNTER — Other Ambulatory Visit: Payer: Self-pay | Admitting: Emergency Medicine

## 2017-06-30 ENCOUNTER — Encounter: Payer: Self-pay | Admitting: Family Medicine

## 2017-06-30 ENCOUNTER — Ambulatory Visit (INDEPENDENT_AMBULATORY_CARE_PROVIDER_SITE_OTHER): Payer: Medicare Other | Admitting: Family Medicine

## 2017-06-30 VITALS — BP 146/81 | HR 61 | Temp 97.7°F | Ht 63.5 in | Wt 129.0 lb

## 2017-06-30 DIAGNOSIS — Z23 Encounter for immunization: Secondary | ICD-10-CM

## 2017-06-30 DIAGNOSIS — Z79899 Other long term (current) drug therapy: Secondary | ICD-10-CM | POA: Diagnosis not present

## 2017-06-30 DIAGNOSIS — M542 Cervicalgia: Secondary | ICD-10-CM | POA: Diagnosis not present

## 2017-06-30 DIAGNOSIS — G8929 Other chronic pain: Secondary | ICD-10-CM | POA: Diagnosis not present

## 2017-06-30 DIAGNOSIS — H9201 Otalgia, right ear: Secondary | ICD-10-CM

## 2017-06-30 DIAGNOSIS — M546 Pain in thoracic spine: Secondary | ICD-10-CM | POA: Diagnosis not present

## 2017-06-30 DIAGNOSIS — H6981 Other specified disorders of Eustachian tube, right ear: Secondary | ICD-10-CM | POA: Diagnosis not present

## 2017-06-30 DIAGNOSIS — M4120 Other idiopathic scoliosis, site unspecified: Secondary | ICD-10-CM

## 2017-06-30 MED ORDER — HYDROCODONE-ACETAMINOPHEN 5-325 MG PO TABS: 1.0000 | ORAL_TABLET | Freq: Four times a day (QID) | ORAL | 0 refills | Status: DC | PRN

## 2017-06-30 NOTE — Patient Instructions (Signed)
It was good to see you today!  I am glad that your back is doing better Please stop by the lab for your annual urine drug screen today  You might also try some OTC ear wax softening drops such as Debrox- these can help to remove the wax from your ear canal  For eustachian tube dysfunction which may cause your ears to pop and crack- please try the nasacort spray that I gave you today

## 2017-06-30 NOTE — Progress Notes (Signed)
[place] at [facility] [address] [place], [place] [address] [phone]  Date:  06/30/2017   Name:  Dawn Powers   DOB:  1945/11/13   MRN:  [id number]  PCP:  Darreld Mclean, MD    Chief Complaint: Ear Pain (x 1 month off and on)   History of Present Illness:  Dawn Powers is a 71 y.o. very pleasant female patient who presents with the following:  History of HTN, endometrial cancer and back pain However she notes that her back is much better!   I have not filled her hydrocodone in a few months.  She would like a RF now as she is nearly out, but her use is much decreased  Here today with concern of ear pain- she has noted this off an on, only on the RIGHT side, for 1-2 weeks.  The ear feels full and congested, and may pop/ crack when she yawns or opens her mouth.  Her hearing is intermittently a bit muffled  Indication for chronic opioid: chronic back pain Medication and dose: hydrocodone 5 # pills per month: 90 Last UDS date: 9/17 Pain contract signed (Y/N): 9/17 Date narcotic database last reviewed (include red flags): today  Notes some sneezing and nasal congestion No fever or chills No CP or SOB No rash No ST, no nausea, vomiting or diarrhea She has a history of endometrial cancer and wonders how often she should have breast cancer screening- I encouraged annual screening for her  Patient Active Problem List   Diagnosis Date Noted  . Endometrial cancer ([place]) 09/26/2015  . Essential hypertension 05/23/2015  . Acquired scoliosis 04/27/2014  . Thoracic facet joint syndrome ([place]) 04/27/2014  . Back pain 03/24/2014  . Chronic pain syndrome 03/24/2014  . Scoliosis (and kyphoscoliosis), idiopathic 12/22/2012  . Osteopenia 12/22/2012  . Other and unspecified hyperlipidemia 12/22/2012    Past Medical History:  Diagnosis Date  . Arthritis   . Cancer [facility])    Endometrial   . Heart murmur   . Hyperlipidemia    . Pain    lower back -hx "DDD"  . Radiation 11/15/15, 11/23/15, 11/30/15, 12/07/15, 12/14/15   HDR brachytherapy    Past Surgical History:  Procedure Laterality Date  . COLONOSCOPY    . FRACTURE SURGERY Right    ankle  . GANGLION CYST EXCISION    . LYMPH NODE BIOPSY N/A 09/26/2015   Procedure: SENTINEL LYMPH NODE BIOPSY;  Surgeon: Everitt Amber, MD;  [facility];  Service: Gynecology;  Laterality: N/A;  . ROBOTIC ASSISTED TOTAL HYSTERECTOMY WITH BILATERAL SALPINGO OOPHERECTOMY Bilateral 09/26/2015   Procedure: ROBOTIC ASSISTED TOTAL HYSTERECTOMY WITH BILATERAL SALPINGO OOPHORECTOMY;  Surgeon: Everitt Amber, MD;  [facility];  Service: Gynecology;  Laterality: Bilateral;    Social History  Substance Use Topics  . Smoking status: Former Smoker    Years: 4.00    Types: Cigarettes    Quit date: 09/30/1998  . Smokeless tobacco: Never Used  . Alcohol use 1.8 oz/week    3 Standard drinks or equivalent per week     Comment: [place] - daily    Family History  Problem Relation Age of Onset  . Heart disease Mother   . Alcohol abuse Mother        breast cancer - questionable   . Dementia Mother   . Alcohol abuse Father   . Prostate cancer Father 9  prostate and colon- unsure of primary   . Kidney cancer Brother   . Alcohol abuse Maternal Grandfather   . Alcohol abuse Sister   . Esophageal cancer Neg Hx   . Stomach cancer Neg Hx   . Rectal cancer Neg Hx     Allergies  Allergen Reactions  . Morphine And Related Hives    Medication list has been reviewed and updated.  Current Outpatient Prescriptions on File Prior to Visit  Medication Sig Dispense Refill  . Propylene Glycol (SYSTANE BALANCE OP) Apply 1 drop to eye daily as needed (dry eyes).     . traZODone (DESYREL) 50 MG tablet Take 50- 75 mg at bedtime as needed for sleep 45 tablet 11   No current facility-administered medications on file prior to visit.     Review of Systems:  As per HPI- otherwise  negative.   Physical Examination: Vitals:   06/30/17 1548  BP: (!) 146/81  Pulse: 61  Temp: 97.7 F (36.5 C)  SpO2: 100%   Vitals:   06/30/17 1548  Weight: 129 lb (58.5 kg)  Height: 5' 3.5" (1.613 m)   Body mass index is 22.49 kg/m. Ideal Body Weight: Weight in (lb) to have BMI = 25: 143.1  GEN: WDWN, NAD, Non-toxic, A & O x 3   HEENT: Atraumatic, Normocephalic. Neck supple. No masses, No LAD.  There is some wax in the left ear canal.  The right ear canal is more occluded- although not totally- with wax.  Ears and Nose: No external deformity. CV: RRR, No M/G/R. No JVD. No thrill. No extra heart sounds. PULM: CTA B, no wheezes, crackles, rhonchi. No retractions. No resp. distress. No accessory muscle use. ABD: S, NT, ND, +BS. No rebound. No HSM. EXTR: No c/c/e NEURO Normal gait.  PSYCH: Normally interactive. Conversant. Not depressed or anxious appearing.  Calm demeanor.  Looks well, slight build  VC obtained. Used warm water irrigation and curette to remove quite a bit of wax from right ear- pt felt better Bilateral TM wnl, oropharynx normal.  PEERL,EOMI.   Nasal cavity is congested and inflamed, more on the right side   Assessment and Plan: Dysfunction of right eustachian tube  Neck pain - Plan: HYDROcodone-acetaminophen (NORCO) 5-325 MG tablet, Pain Mgmt, Profile 8 w/Conf, U  Scoliosis (and kyphoscoliosis), idiopathic - Plan: HYDROcodone-acetaminophen (NORCO) 5-325 MG tablet  Chronic bilateral thoracic back pain - Plan: HYDROcodone-acetaminophen (NORCO) 5-325 MG tablet  Right ear pain  Need for influenza vaccination - Plan: Flu vaccine HIGH DOSE PF  High risk medication use - Plan: Pain Mgmt, Profile 8 w/Conf, U  Flu shot today Medication refills as above, she will get a UDS today Removed wax from her ear and she felt much better Gave sample of nasacort to use for ETD   Signed Lamar Blinks, MD

## 2017-06-30 NOTE — Telephone Encounter (Signed)
[facility]: last filled her hydrocodone in June.  Ok to refill but she will need to come in for a visit soon

## 2017-06-30 NOTE — Telephone Encounter (Signed)
Refill for HYDROcodone   Pt says that provider mails Rx to her at her home address.    CB: [phone]

## 2017-07-02 LAB — PAIN MGMT, PROFILE 8 W/CONF, U
6 Acetylmorphine: NEGATIVE ng/mL (ref <10)
Alcohol Metabolites: NEGATIVE ng/mL (ref <500)
Amphetamines: NEGATIVE ng/mL (ref <500)
Benzodiazepines: NEGATIVE ng/mL (ref <100)
Buprenorphine, Urine: NEGATIVE ng/mL (ref <5)
Cocaine Metabolite: NEGATIVE ng/mL (ref <150)
Creatinine: 27.9 mg/dL
MDMA: NEGATIVE ng/mL (ref <500)
Marijuana Metabolite: NEGATIVE ng/mL (ref <20)
Opiates: NEGATIVE ng/mL (ref <100)
Oxidant: NEGATIVE ug/mL (ref <200)
Oxycodone: NEGATIVE ng/mL (ref <100)
pH: 6.43 (ref 4.5–9.0)

## 2017-07-02 NOTE — Telephone Encounter (Signed)
Rx mailed to pt

## 2017-07-10 ENCOUNTER — Encounter: Payer: Self-pay | Admitting: Family Medicine

## 2017-07-30 ENCOUNTER — Telehealth: Payer: Self-pay | Admitting: Family Medicine

## 2017-07-30 NOTE — Telephone Encounter (Signed)
Pt called states script needed for shingles double dose shot to [facility].

## 2017-08-01 MED ORDER — ZOSTER VAC RECOMB ADJUVANTED 50 MCG/0.5ML IM SUSR: 0.5000 mL | Freq: Once | INTRAMUSCULAR | 1 refills | Status: DC

## 2017-08-01 NOTE — Telephone Encounter (Signed)
rx sent to [facility]

## 2017-08-27 DIAGNOSIS — L821 Other seborrheic keratosis: Secondary | ICD-10-CM | POA: Diagnosis not present

## 2017-08-27 DIAGNOSIS — L218 Other seborrheic dermatitis: Secondary | ICD-10-CM | POA: Diagnosis not present

## 2017-08-27 DIAGNOSIS — D229 Melanocytic nevi, unspecified: Secondary | ICD-10-CM | POA: Diagnosis not present

## 2017-08-27 DIAGNOSIS — L853 Xerosis cutis: Secondary | ICD-10-CM | POA: Diagnosis not present

## 2017-08-27 DIAGNOSIS — L658 Other specified nonscarring hair loss: Secondary | ICD-10-CM | POA: Diagnosis not present

## 2017-09-05 ENCOUNTER — Telehealth: Payer: Self-pay | Admitting: Family Medicine

## 2017-09-05 NOTE — Telephone Encounter (Signed)
Spoke with Mrs. Olthoff, she declined to schedule an annual wellness visit at this time.

## 2017-09-13 ENCOUNTER — Other Ambulatory Visit: Payer: Self-pay | Admitting: Family Medicine

## 2017-09-13 DIAGNOSIS — M542 Cervicalgia: Secondary | ICD-10-CM

## 2017-09-13 DIAGNOSIS — M4120 Other idiopathic scoliosis, site unspecified: Secondary | ICD-10-CM

## 2017-09-13 DIAGNOSIS — G8929 Other chronic pain: Secondary | ICD-10-CM

## 2017-09-13 DIAGNOSIS — M546 Pain in thoracic spine: Secondary | ICD-10-CM

## 2017-09-15 ENCOUNTER — Telehealth: Payer: Self-pay | Admitting: Family Medicine

## 2017-09-15 MED ORDER — HYDROCODONE-ACETAMINOPHEN 5-325 MG PO TABS: 1.0000 | ORAL_TABLET | Freq: Four times a day (QID) | ORAL | 0 refills | Status: DC | PRN

## 2017-09-15 NOTE — Telephone Encounter (Signed)
Pt requesting refill on hydrocodone.

## 2017-09-15 NOTE — Telephone Encounter (Signed)
Copied from [place] [id number]. Topic: Quick Communication - Rx Refill/Question >> Sep 15, 2017  2:55 PM Lennox Solders wrote: Has the patient contacted their pharmacy? no  (Agent: If no, request that the patient contact the pharmacy for the refill.)   Preferred Pharmacy (with phone number or street name): pt would like new rx hydrocodone. Pt stated dr copland mail her rx to home address  Agent: Please be advised that RX refills may take up to 3 business days. We ask that you follow-up with your pharmacy.

## 2017-09-15 NOTE — Telephone Encounter (Signed)
Called her and let her know that I sent her rx to [facility]

## 2017-09-15 NOTE — Telephone Encounter (Signed)
Pt is requesting refill on hydrocodone-acetaminophen 5-325mg .

## 2017-09-17 ENCOUNTER — Ambulatory Visit: Payer: Medicare Other | Attending: Gynecologic Oncology | Admitting: Gynecologic Oncology

## 2017-09-17 ENCOUNTER — Encounter: Payer: Self-pay | Admitting: Gynecologic Oncology

## 2017-09-17 VITALS — BP 119/55 | HR 72 | Temp 97.7°F | Resp 18 | Ht 63.5 in | Wt 130.5 lb

## 2017-09-17 DIAGNOSIS — Z87891 Personal history of nicotine dependence: Secondary | ICD-10-CM | POA: Diagnosis not present

## 2017-09-17 DIAGNOSIS — K5901 Slow transit constipation: Secondary | ICD-10-CM | POA: Diagnosis not present

## 2017-09-17 DIAGNOSIS — C541 Malignant neoplasm of endometrium: Secondary | ICD-10-CM

## 2017-09-17 DIAGNOSIS — E785 Hyperlipidemia, unspecified: Secondary | ICD-10-CM | POA: Diagnosis not present

## 2017-09-17 DIAGNOSIS — Z8542 Personal history of malignant neoplasm of other parts of uterus: Secondary | ICD-10-CM | POA: Diagnosis not present

## 2017-09-17 DIAGNOSIS — Z923 Personal history of irradiation: Secondary | ICD-10-CM

## 2017-09-17 DIAGNOSIS — Z79899 Other long term (current) drug therapy: Secondary | ICD-10-CM | POA: Insufficient documentation

## 2017-09-17 DIAGNOSIS — K5900 Constipation, unspecified: Secondary | ICD-10-CM | POA: Insufficient documentation

## 2017-09-17 DIAGNOSIS — F5101 Primary insomnia: Secondary | ICD-10-CM

## 2017-09-17 DIAGNOSIS — Z90710 Acquired absence of both cervix and uterus: Secondary | ICD-10-CM | POA: Insufficient documentation

## 2017-09-17 DIAGNOSIS — G4700 Insomnia, unspecified: Secondary | ICD-10-CM | POA: Insufficient documentation

## 2017-09-17 NOTE — Patient Instructions (Signed)
Please notify Dr Denman George at phone number [phone] if you notice vaginal bleeding, new pelvic or abdominal pains, bloating, feeling full easy, or a change in bladder or bowel function.   Please return to see Dr Denman George in 3 months at the [facility].  For your constipation try using Metamucil 1 spoonful in the morning. Or Miralax (same dose).  Melatonin dosing is 5mg  at night.

## 2017-09-17 NOTE — Progress Notes (Signed)
FOLLOW UP VISIT  Assessment:    71 y.o. 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   S/p robotic hysterectomy, BSO, sentinel lymph node biopsy on 09/26/15 with high intermediate risk factors for recurrence, s/p vaginal brachytherapy adjuvant therapy completed in March, 2017.  No evidence of recurrence on today's exam.  Sleep difficulties/insomnia. Constipation.  Plan: Discussed symptoms concerning for recurrence and recommendation to return to see [place] immediately should they develop. Melatonin for sleep disturbance. Constipation - try miralax or metamucil. Instructions given.  Return to clinic in 3 months to me. We will transition to 6 monthly exams in March, 2019  HPI:  Dawn Powers Powers is a 71 y.o. year old initially seen in consultation on 09/08/15 referred by Dr Meissinger for grade 2 endometrial cancer.  She then underwent a robotic hysterectomy, BSO and bilateral SLN biopsy on 79/02/40 without complications.  Her postoperative course was uncomplicated.  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 She was recommended adjuvant vaginal brachytherapy for high/intermediate risk factors.  Interval Hx:  She completed vaginal brachytherapy in March, 2017.  She is seen today for a routine surveillance visit. She has no complaints concerning for recurrence. She is using her vaginal dilator but is not sexually active. No bleeding. She reports sleep disturbance (insomnia and uses trazadone). She reports constipation.  Past Medical History:  Diagnosis Date  . Arthritis   . Cancer [facility])    Endometrial   . Heart murmur   . Hyperlipidemia   . Pain    lower back -hx "DDD"  . Radiation 11/15/15, 11/23/15, 11/30/15, 12/07/15, 12/14/15   HDR brachytherapy   Past Surgical History:  Procedure Laterality Date  . COLONOSCOPY    .  FRACTURE SURGERY Right    ankle  . GANGLION CYST EXCISION    . LYMPH NODE BIOPSY N/A 09/26/2015   Procedure: SENTINEL LYMPH NODE BIOPSY;  Surgeon: Everitt Amber, MD;  [facility];  Service: Gynecology;  Laterality: N/A;  . ROBOTIC ASSISTED TOTAL HYSTERECTOMY WITH BILATERAL SALPINGO OOPHERECTOMY Bilateral 09/26/2015   Procedure: ROBOTIC ASSISTED TOTAL HYSTERECTOMY WITH BILATERAL SALPINGO OOPHORECTOMY;  Surgeon: Everitt Amber, MD;  [facility];  Service: Gynecology;  Laterality: Bilateral;   Family History  Problem Relation Age of Onset  . Heart disease Mother   . Alcohol abuse Mother        breast cancer - questionable   . Dementia Mother   . Alcohol abuse Father   . Prostate cancer Father 44       prostate and colon- unsure of primary   . Kidney cancer Brother   . Alcohol abuse Maternal Grandfather   . Alcohol abuse Sister   . Esophageal cancer Neg Hx   . Stomach cancer Neg Hx   . Rectal cancer Neg Hx    Social History   Socioeconomic History  . Marital status: Married    Spouse name: Not on file  . Number of children: 2  . Years of education: Not on file  . Highest education level: Not on file  Social Needs  . Financial resource strain: Not on file  . Food insecurity - worry: Not on file  . Food insecurity - inability: Not on file  . Transportation needs - medical: Not on file  . Transportation needs - non-medical: Not on file  Occupational History  . Not on file  Tobacco Use  . Smoking status: Former Smoker    Years: 4.00    Types: Cigarettes    Last attempt to quit: 09/30/1998    Years since quitting: 18.9  . Smokeless tobacco: Never Used  Substance and Sexual Activity  . Alcohol use: Yes    Alcohol/week: 1.8 oz    Types: 3 Standard drinks or equivalent per week    Comment: WINE AND BOURBON - daily  . Drug use: No  . Sexual activity: Yes    Partners: Female    Birth control/protection: None  Other Topics Concern  . Not on file  Social History Narrative    Married. Education: college and Other. Exercise 4 times a week for 50 minutes.   Current Outpatient Medications on File Prior to Visit  Medication Sig Dispense Refill  . HYDROcodone-acetaminophen (NORCO) 5-325 MG tablet Take 1-2 tablets by mouth every 6 (six) hours as needed for moderate pain or severe pain. 90 tablet 0  . Propylene Glycol (SYSTANE BALANCE OP) Apply 1 drop to eye daily as needed (dry eyes).     . traZODone (DESYREL) 50 MG tablet Take 50- 75 mg at bedtime as needed for sleep 45 tablet 11   No current facility-administered medications on file prior to visit.    Allergies  Allergen Reactions  . Morphine And Related Hives      Review of systems: Constitutional:  She has no weight gain or weight loss. She has no fever or chills. Eyes: No blurred vision Ears, Nose, Mouth, Throat: No dizziness, headaches or changes in hearing. No mouth sores. Cardiovascular: No chest pain, palpitations or edema. Respiratory:  No shortness of breath, wheezing or cough Gastrointestinal: She has normal bowel movements without diarrhea or constipation. She denies any nausea or vomiting. She denies blood in her stool or heart burn. Genitourinary:  She denies pelvic pain, pelvic pressure or changes in her urinary function. She has no hematuria, dysuria, or incontinence. She has no irregular vaginal bleeding or vaginal discharge Musculoskeletal: Denies muscle weakness or joint pains.  Skin:  She has no skin changes, rashes or itching Neurological: + chronic neuropathic back pain. Psychiatric:  She denies depression or anxiety. Hematologic/Lymphatic:   No easy bruising or bleeding   Physical Exam: Blood pressure (!) 119/55, pulse 72, temperature 97.7 F (36.5 C), temperature source Oral, resp. rate 18, height 5' 3.5" (1.613 m), weight 130 lb 8 oz (59.2 kg), SpO2 99 %. General: Well dressed, well nourished in no apparent distress.   HEENT:  Normocephalic and atraumatic, no lesions.   Extraocular muscles intact. Sclerae anicteric. Pupils equal, round, reactive. No mouth sores or ulcers. Thyroid is normal size, not nodular, midline. Abdomen:  Soft, nontender, nondistended.  No palpable masses.  No hepatosplenomegaly.  No ascites. Normal bowel sounds.  No hernias.  Incisions are well healed Genitourinary: Normal EGBUS  Vaginal cuff intact.  No bleeding or discharge.  No cul de sac fullness. Mild radiation changes to vaginal mucosa. Extremities: No cyanosis, clubbing or edema.  No calf tenderness or erythema. No palpable cords. Psychiatric: Mood and affect are appropriate. Neurological: Awake, alert and oriented x 3. Sensation is intact, no neuropathy.  Musculoskeletal: No pain, normal strength and range of motion.  Donaciano Eva, MD

## 2017-10-01 ENCOUNTER — Encounter: Payer: Self-pay | Admitting: Family Medicine

## 2017-10-01 ENCOUNTER — Telehealth: Payer: Self-pay | Admitting: Family Medicine

## 2017-10-01 DIAGNOSIS — R0982 Postnasal drip: Secondary | ICD-10-CM

## 2017-10-01 MED ORDER — IPRATROPIUM BROMIDE 0.03 % NA SOLN: 2.0000 | Freq: Four times a day (QID) | NASAL | 6 refills | Status: DC

## 2017-10-01 NOTE — Telephone Encounter (Signed)
Copied from [place] [id number]. Topic: Quick Communication - See Telephone Encounter >> Oct 01, 2017  8:51 AM Bea Graff, NT wrote: CRM for notification. See Telephone encounter for: Pt would like to see if a Z Pac can be called in for a sinus infection. Coughing, facial pressure, and drainage. No fever. Uses [employer].  10/01/17.

## 2017-10-01 NOTE — Telephone Encounter (Signed)
Called pt- she reports that a week ago she had a sore throat and then a stuffy nose.  She feels like there is a drip in the back of her throat and she is coughing esp at night.  No fever noted, but she did feel chilled a couple of times No history of glaucoma We will try some atrovent nasal for her, and she will let me know if not better in the next few days

## 2017-10-02 ENCOUNTER — Telehealth: Payer: Self-pay | Admitting: Family Medicine

## 2017-10-02 ENCOUNTER — Ambulatory Visit: Payer: Medicare Other | Admitting: Family Medicine

## 2017-10-02 DIAGNOSIS — H01024 Squamous blepharitis left upper eyelid: Secondary | ICD-10-CM | POA: Diagnosis not present

## 2017-10-02 DIAGNOSIS — H01021 Squamous blepharitis right upper eyelid: Secondary | ICD-10-CM | POA: Diagnosis not present

## 2017-10-02 DIAGNOSIS — H01025 Squamous blepharitis left lower eyelid: Secondary | ICD-10-CM | POA: Diagnosis not present

## 2017-10-02 DIAGNOSIS — H01022 Squamous blepharitis right lower eyelid: Secondary | ICD-10-CM | POA: Diagnosis not present

## 2017-10-02 DIAGNOSIS — H2513 Age-related nuclear cataract, bilateral: Secondary | ICD-10-CM | POA: Diagnosis not present

## 2017-10-02 DIAGNOSIS — H18453 Nodular corneal degeneration, bilateral: Secondary | ICD-10-CM | POA: Diagnosis not present

## 2017-10-02 DIAGNOSIS — H04123 Dry eye syndrome of bilateral lacrimal glands: Secondary | ICD-10-CM | POA: Diagnosis not present

## 2017-10-02 MED ORDER — DICLOFENAC SODIUM 1 % TD GEL: TRANSDERMAL | 3 refills | Status: DC

## 2017-10-02 NOTE — Telephone Encounter (Signed)
Copied from [place]. Topic: Quick Communication - See Telephone Encounter >> Oct 02, 2017  9:52 AM Synthia Innocent wrote: CRM for notification. See Telephone encounter for:  Patient would like Dr Lorelei Pont to call in Voltaren Gel 1% for left shoulder pain. [employer] 10/02/17.

## 2017-11-27 ENCOUNTER — Telehealth: Payer: Self-pay | Admitting: Family Medicine

## 2017-11-27 DIAGNOSIS — M4120 Other idiopathic scoliosis, site unspecified: Secondary | ICD-10-CM

## 2017-11-27 DIAGNOSIS — M546 Pain in thoracic spine: Secondary | ICD-10-CM

## 2017-11-27 DIAGNOSIS — M542 Cervicalgia: Secondary | ICD-10-CM

## 2017-11-27 DIAGNOSIS — G8929 Other chronic pain: Secondary | ICD-10-CM

## 2017-11-27 NOTE — Telephone Encounter (Signed)
Reviewed [place]- time for office visit, reminded pt

## 2017-11-27 NOTE — Telephone Encounter (Signed)
Pt is requesting refill on hydrocodone

## 2017-11-27 NOTE — Telephone Encounter (Signed)
Forwarding to Dawn Powers's CMA- he is covering for PCP today in her absence.

## 2017-11-27 NOTE — Telephone Encounter (Signed)
Copied from [place]. Topic: Quick Communication - See Telephone Encounter >> Nov 27, 2017  8:43 AM [place], Laban Emperor wrote: CRM for notification. See Telephone encounter for:  Pt states that she is having back spasms soon as she wakes up in the morning, about 1-2 hours. And is having to take 1 1/2 of the HYDROcodone-acetaminophen (NORCO) 5-325 MG tablet [[id number]]. Pt also states while she is laying on a heat pad she is fine but the pain kicks in when she gets up.  11/27/17.

## 2017-12-01 ENCOUNTER — Other Ambulatory Visit: Payer: Self-pay | Admitting: Family Medicine

## 2017-12-01 DIAGNOSIS — M542 Cervicalgia: Secondary | ICD-10-CM

## 2017-12-01 DIAGNOSIS — M4120 Other idiopathic scoliosis, site unspecified: Secondary | ICD-10-CM

## 2017-12-01 DIAGNOSIS — G8929 Other chronic pain: Secondary | ICD-10-CM

## 2017-12-01 DIAGNOSIS — M546 Pain in thoracic spine: Secondary | ICD-10-CM

## 2017-12-02 ENCOUNTER — Encounter: Payer: Self-pay | Admitting: Family Medicine

## 2017-12-03 ENCOUNTER — Other Ambulatory Visit: Payer: Self-pay | Admitting: Family Medicine

## 2017-12-03 DIAGNOSIS — G8929 Other chronic pain: Secondary | ICD-10-CM

## 2017-12-03 DIAGNOSIS — M542 Cervicalgia: Secondary | ICD-10-CM

## 2017-12-03 DIAGNOSIS — M4120 Other idiopathic scoliosis, site unspecified: Secondary | ICD-10-CM

## 2017-12-03 DIAGNOSIS — M546 Pain in thoracic spine: Secondary | ICD-10-CM

## 2017-12-19 ENCOUNTER — Encounter: Payer: Self-pay | Admitting: Gynecologic Oncology

## 2017-12-19 ENCOUNTER — Inpatient Hospital Stay: Payer: Medicare Other | Attending: Gynecologic Oncology | Admitting: Gynecologic Oncology

## 2017-12-19 VITALS — BP 140/65 | HR 62 | Temp 98.6°F | Resp 18 | Ht 64.0 in | Wt 125.6 lb

## 2017-12-19 DIAGNOSIS — Z90722 Acquired absence of ovaries, bilateral: Secondary | ICD-10-CM | POA: Diagnosis not present

## 2017-12-19 DIAGNOSIS — Z923 Personal history of irradiation: Secondary | ICD-10-CM | POA: Diagnosis not present

## 2017-12-19 DIAGNOSIS — Z8542 Personal history of malignant neoplasm of other parts of uterus: Secondary | ICD-10-CM | POA: Diagnosis not present

## 2017-12-19 DIAGNOSIS — Z87891 Personal history of nicotine dependence: Secondary | ICD-10-CM

## 2017-12-19 DIAGNOSIS — Z90710 Acquired absence of both cervix and uterus: Secondary | ICD-10-CM

## 2017-12-19 DIAGNOSIS — C541 Malignant neoplasm of endometrium: Secondary | ICD-10-CM

## 2017-12-19 NOTE — Patient Instructions (Signed)
Please notify Dr Denman George at phone number [phone] if you notice vaginal bleeding, new pelvic or abdominal pains, bloating, feeling full easy, or a change in bladder or bowel function.   Dr Denman George is sending your endometrial cancer tissue for MSI (microsatellite instability testing). If positive, she will have you evaluated by the genetics specialists to determine if you carry a cancer gene.   Please call Dr Serita Grit office in the late summer to schedule an appointment with her for September, 2019.

## 2017-12-19 NOTE — Progress Notes (Signed)
FOLLOW UP VISIT  Assessment:    72 y.o. 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   S/p robotic hysterectomy, BSO, sentinel lymph node biopsy on 09/26/15 with high intermediate risk factors for recurrence, s/p vaginal brachytherapy adjuvant therapy completed in March, 2017.  No evidence of recurrence on today's exam.  Strong family history of malignancy, MSI status unknown in her hysterectomy specimen.   Plan: Discussed symptoms concerning for recurrence and recommendation to return to see [place] immediately should they develop. Will test her tumor for MSI and refer to genetics if MSI is present.   Return to clinic in 6 months.  HPI:  Dawn Powers is a 72 y.o. year old initially seen in consultation on 09/08/15 referred by Dr Meissinger for grade 2 endometrial cancer.  She then underwent a robotic hysterectomy, BSO and bilateral SLN biopsy on 76/72/09 without complications.  Her postoperative course was uncomplicated.  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 She was recommended adjuvant vaginal brachytherapy for high/intermediate risk factors.  Interval Hx:  She completed vaginal brachytherapy in March, 2017.  She is seen today for a routine surveillance visit. She has no complaints concerning for recurrence. She is using her vaginal dilator but is not sexually active. No bleeding.  She reports constipation.  Her brothers have been recently diagnosed with prostate and renal cancer. MSI testing had not been performed on her original hysterectomy specimen.   Past Medical History:  Diagnosis Date  . Arthritis   . Cancer [facility])    Endometrial   . Heart murmur   . Hyperlipidemia   . Pain    lower back -hx "DDD"  . Radiation 11/15/15, 11/23/15, 11/30/15, 12/07/15, 12/14/15   HDR brachytherapy   Past Surgical History:   Procedure Laterality Date  . COLONOSCOPY    . FRACTURE SURGERY Right    ankle  . GANGLION CYST EXCISION    . LYMPH NODE BIOPSY N/A 09/26/2015   Procedure: SENTINEL LYMPH NODE BIOPSY;  Surgeon: Everitt Amber, MD;  Location: [facility];  Service: Gynecology;  Laterality: N/A;  . ROBOTIC ASSISTED TOTAL HYSTERECTOMY WITH BILATERAL SALPINGO OOPHERECTOMY Bilateral 09/26/2015   Procedure: ROBOTIC ASSISTED TOTAL HYSTERECTOMY WITH BILATERAL SALPINGO OOPHORECTOMY;  Surgeon: Everitt Amber, MD;  Location: [facility];  Service: Gynecology;  Laterality: Bilateral;   Family History  Problem Relation Age of Onset  . Heart disease Mother   . Alcohol abuse Mother        breast cancer - questionable   . Dementia Mother   . Alcohol abuse Father   . Prostate cancer Father 60       prostate and colon- unsure of primary   . Kidney cancer Brother   . Alcohol abuse Maternal Grandfather   . Alcohol abuse Sister   . Esophageal cancer Neg Hx   . Stomach cancer Neg Hx   . Rectal cancer Neg Hx    Social History   Socioeconomic History  . Marital status: Married    Spouse name: Not on file  . Number of children: 2  . Years of education: Not on file  . Highest education level: Not on file  Occupational History  . Not on file  Social Needs  . Financial resource strain: Not on file  . Food insecurity:    Worry: Not on file    Inability: Not on file  .  Transportation needs:    Medical: Not on file    Non-medical: Not on file  Tobacco Use  . Smoking status: Former Smoker    Years: 4.00    Types: Cigarettes    Last attempt to quit: 09/30/1998    Years since quitting: 19.2  . Smokeless tobacco: Never Used  Substance and Sexual Activity  . Alcohol use: Yes    Alcohol/week: 1.8 oz    Types: 3 Standard drinks or equivalent per week    Comment: WINE AND BOURBON - daily  . Drug use: No  . Sexual activity: Yes    Partners: Female    Birth control/protection: None  Lifestyle  . Physical activity:    Days per  week: Not on file    Minutes per session: Not on file  . Stress: Not on file  Relationships  . Social connections:    Talks on phone: Not on file    Gets together: Not on file    Attends religious service: Not on file    Active member of club or organization: Not on file    Attends meetings of clubs or organizations: Not on file    Relationship status: Not on file  . Intimate partner violence:    Fear of current or ex partner: Not on file    Emotionally abused: Not on file    Physically abused: Not on file    Forced sexual activity: Not on file  Other Topics Concern  . Not on file  Social History Narrative   Married. Education: college and Other. Exercise 4 times a week for 50 minutes.   Current Outpatient Medications on File Prior to Visit  Medication Sig Dispense Refill  . HYDROcodone-acetaminophen (NORCO/VICODIN) 5-325 MG tablet Take 1-2 tablets by mouth every 6 hours as needed for pain.  Time for office visit please 90 tablet 0  . Propylene Glycol (SYSTANE BALANCE OP) Apply 1 drop to eye daily as needed (dry eyes).     . traZODone (DESYREL) 50 MG tablet Take 50- 75 mg at bedtime as needed for sleep 45 tablet 11  . diclofenac sodium (VOLTAREN) 1 % GEL Apply 2 grams to shoulder joint up to 4x daily as needed for pain (Patient not taking: Reported on 12/19/2017) 100 g 3  . ipratropium (ATROVENT) 0.03 % nasal spray Place 2 sprays into the nose 4 (four) times daily. (Patient not taking: Reported on 12/19/2017) 30 mL 6   No current facility-administered medications on file prior to visit.    Allergies  Allergen Reactions  . Morphine And Related Hives      Review of systems: Constitutional:  She has no weight gain or weight loss. She has no fever or chills. Eyes: No blurred vision Ears, Nose, Mouth, Throat: No dizziness, headaches or changes in hearing. No mouth sores. Cardiovascular: No chest pain, palpitations or edema. Respiratory:  No shortness of breath, wheezing or  cough Gastrointestinal: She has normal bowel movements without diarrhea or constipation. She denies any nausea or vomiting. She denies blood in her stool or heart burn. Genitourinary:  She denies pelvic pain, pelvic pressure or changes in her urinary function. She has no hematuria, dysuria, or incontinence. She has no irregular vaginal bleeding or vaginal discharge Musculoskeletal: Denies muscle weakness or joint pains.  Skin:  She has no skin changes, rashes or itching Neurological: + chronic neuropathic back pain. Psychiatric:  She denies depression or anxiety. Hematologic/Lymphatic:   No easy bruising or bleeding   Physical Exam:  Blood pressure 140/65, pulse 62, temperature 98.6 F (37 C), resp. rate 18, height '5\' 4"'$  (1.626 m), weight 125 lb 9.6 oz (57 kg), SpO2 99 %. General: Well dressed, well nourished in no apparent distress.   HEENT:  Normocephalic and atraumatic, no lesions.  Extraocular muscles intact. Sclerae anicteric. Pupils equal, round, reactive. No mouth sores or ulcers. Thyroid is normal size, not nodular, midline. Abdomen:  Soft, nontender, nondistended.  No palpable masses.  No hepatosplenomegaly.  No ascites. Normal bowel sounds.  No hernias.  Incisions are well healed Genitourinary: Normal EGBUS  Vaginal cuff intact.  No bleeding or discharge.  No cul de sac fullness. Mild radiation changes to vaginal mucosa. Extremities: No cyanosis, clubbing or edema.  No calf tenderness or erythema. No palpable cords. Psychiatric: Mood and affect are appropriate. Neurological: Awake, alert and oriented x 3. Sensation is intact, no neuropathy.  Musculoskeletal: No pain, normal strength and range of motion.  Thereasa Solo, MD

## 2017-12-19 NOTE — Progress Notes (Signed)
MSI ordered on hysterectomy specimen with Suanne Marker at [facility] path.

## 2017-12-22 ENCOUNTER — Other Ambulatory Visit (HOSPITAL_COMMUNITY)
Admission: RE | Admit: 2017-12-22 | Discharge: 2017-12-22 | Disposition: A | Discharge status: Home / Self Care | Payer: Medicare Other | Source: Ambulatory Visit | Attending: Gynecologic Oncology | Admitting: Gynecologic Oncology

## 2017-12-22 DIAGNOSIS — C55 Malignant neoplasm of uterus, part unspecified: Secondary | ICD-10-CM | POA: Diagnosis not present

## 2017-12-29 ENCOUNTER — Telehealth: Payer: Self-pay | Admitting: *Deleted

## 2017-12-29 NOTE — Telephone Encounter (Signed)
Received Declaration of a Desire For A Natural Death and [place] for patient via Ladell Heads, L.L.P; forwarded to provider/SLS 04/01

## 2017-12-31 DIAGNOSIS — M13812 Other specified arthritis, left shoulder: Secondary | ICD-10-CM | POA: Diagnosis not present

## 2017-12-31 DIAGNOSIS — M25512 Pain in left shoulder: Secondary | ICD-10-CM | POA: Diagnosis not present

## 2018-01-05 ENCOUNTER — Telehealth: Payer: Self-pay

## 2018-01-05 NOTE — Telephone Encounter (Signed)
Pt left VM regarding results of MSI.  Notified Joylene John NP - MSI is "normal" and no referral to genetics at this time.  Notified pt via phone, no other needs per pt at this time.

## 2018-01-05 NOTE — Telephone Encounter (Signed)
Received VM from pt in regards to what the name of the test results was that she received from last call this morning and if 'mychart' would be updated so she would have a paper copy.  I notified Melissa NP and she released the results and added a note regarding what MSI is so pt could read on her my chart account. Notified pt via phone, she indicated she thinks she just received those results.  I let her know if she had any further questions/concerns regarding the results she could call back.  She voiced understanding.

## 2018-01-30 ENCOUNTER — Other Ambulatory Visit: Payer: Self-pay | Admitting: Family Medicine

## 2018-01-30 DIAGNOSIS — G4700 Insomnia, unspecified: Secondary | ICD-10-CM

## 2018-02-02 ENCOUNTER — Other Ambulatory Visit: Payer: Self-pay | Admitting: Family Medicine

## 2018-02-02 DIAGNOSIS — G4700 Insomnia, unspecified: Secondary | ICD-10-CM

## 2018-02-02 NOTE — Telephone Encounter (Signed)
Received refill request for traZODone (DESYREL) 50 MG tablet. Last office visit 06/30/17 and last refill 11/06/16.

## 2018-02-03 NOTE — Telephone Encounter (Deleted)
Received refill request for traZODone (DESYREL) 50 MG tablet. Last office visit 06/30/2017 and last refill

## 2018-03-03 ENCOUNTER — Telehealth: Payer: Self-pay

## 2018-03-03 NOTE — Telephone Encounter (Signed)
Returned pt's call regarding making appt with Dr Denman George- no answer at pt's number, left her VM with our contact info.

## 2018-03-03 NOTE — Telephone Encounter (Signed)
Pt called and would like to set up 6 month f/u appt with Dr Denman George - appt made for Sept 13th at 2pm.  Pt also asked when should she see regular Ob/Gyn Dr Willis Modena again- per Lenna Sciara NP- pt should call Dr Meisinger's office for appt within at year of last yearly visit for them to manage her breast health, mammograms, bone density- pt voiced understanding. No other needs per pt at this time.

## 2018-03-10 NOTE — Progress Notes (Signed)
[place] at [facility] [address], [place], [place] [address] [phone]  Date:  03/11/2018   Name:  Dawn Powers   DOB:  November 28, 1945   MRN:  [id number]  PCP:  Darreld Mclean, MD    Chief Complaint: Medication Refill; Ear Fullness (makes "funny noises when bending over", fullness); and Poison Ivy   History of Present Illness:  Dawn Powers is a 72 y.o. very pleasant female patient who presents with the following:  Here today for a follow-up visit Last seen here by myself in October of last year: History of HTN, endometrial cancer and back pain However she notes that her back is much better!   I have not filled her hydrocodone in a few months.  She would like a RF now as she is nearly out, but her use is much decreased  Here today with concern of ear pain- she has noted this off an on, only on the RIGHT side, for 1-2 weeks.  The ear feels full and congested, and may pop/ crack when she yawns or opens her mouth.  Her hearing is intermittently a bit muffled  Indication for chronic opioid: chronic back pain Medication and dose: hydrocodone 5 # pills per month: 90 Last UDS date: 9/17 Pain contract signed (Y/N): 9/17 Date narcotic database last reviewed (include red flags): today  Notes some sneezing and nasal congestion No fever or chills No CP or SOB No rash No ST, no nausea, vomiting or diarrhea She has a history of endometrial cancer and wonders how often she should have breast cancer screening- I encouraged annual screening for her  She continue to follow-up with Gyn/onc, Dr. 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 Orpah Greek She will see her eye doctor and dentist as well soon She had oatmeal only this am  [place] reviewed today 11/27/2017  1  11/27/2017   Hydrocodone-Acetamin 5-325 Mg  90 12 Je Cop  [id number]  Gat (0700)  0 37.50 MME Comm Ins  [place]  09/15/2017  1  09/15/2017  Hydrocodone-Acetamin 5-325 Mg  90 15 Je Cop  [id number]  Gat (0700)  0 30.00 MME Comm Ins  [place]  07/18/2017  1  06/30/2017  Hydrocodone-Acetamin 5-325 Mg  90 15 Je Cop  [id number]  Gat (0700)  0       She does have chronic back pain and uses hydrocodone if she cannot get her back spasm to go away- she does not need this daily.  She generally tries stretching and heat first, and then will resort to her pills is needed  Wt Readings from Last 3 Encounters:  03/11/18 130 lb 6.4 oz (59.1 kg)  12/19/17 125 lb 9.6 oz (57 kg)  09/17/17 130 lb 8 oz (59.2 kg)   Dr. Onnie Graham recommends that they to do a shoulder replacement for her at some point- she is afraid of having surgery and hopes to put this off as long as she can She has found however that her hydrocodone helps her shoulder as well   Patient Active Problem List   Diagnosis Date Noted  . Endometrial cancer ([place]) 09/26/2015  . Essential hypertension 05/23/2015  . Thoracic facet joint syndrome 04/27/2014  . Back pain 03/24/2014  . Chronic pain syndrome  03/24/2014  . Scoliosis (and kyphoscoliosis), idiopathic 12/22/2012  . Osteopenia 12/22/2012  . Other and unspecified hyperlipidemia 12/22/2012    Past Medical History:  Diagnosis Date  . Arthritis   . Cancer [facility])    Endometrial   . Heart murmur   . Hyperlipidemia   . Pain    lower back -hx "DDD"  . Radiation 11/15/15, 11/23/15, 11/30/15, 12/07/15, 12/14/15   HDR brachytherapy    Past Surgical History:  Procedure Laterality Date  . COLONOSCOPY    . FRACTURE SURGERY Right    ankle  . GANGLION CYST EXCISION    . LYMPH NODE BIOPSY N/A 09/26/2015   Procedure: SENTINEL LYMPH NODE BIOPSY;  Surgeon: Everitt Amber, MD;  Location: [facility];  Service: Gynecology;  Laterality: N/A;  . ROBOTIC ASSISTED TOTAL HYSTERECTOMY WITH BILATERAL SALPINGO OOPHERECTOMY Bilateral 09/26/2015    Procedure: ROBOTIC ASSISTED TOTAL HYSTERECTOMY WITH BILATERAL SALPINGO OOPHORECTOMY;  Surgeon: Everitt Amber, MD;  Location: [facility];  Service: Gynecology;  Laterality: Bilateral;    Social History   Tobacco Use  . Smoking status: Former Smoker    Years: 4.00    Types: Cigarettes    Last attempt to quit: 09/30/1998    Years since quitting: 19.4  . Smokeless tobacco: Never Used  Substance Use Topics  . Alcohol use: Yes    Alcohol/week: 1.8 oz    Types: 3 Standard drinks or equivalent per week    Comment: WINE AND BOURBON - daily  . Drug use: No    Family History  Problem Relation Age of Onset  . Heart disease Mother   . Alcohol abuse Mother        breast cancer - questionable   . Dementia Mother   . Alcohol abuse Father   . Prostate cancer Father 81       prostate and colon- unsure of primary   . Kidney cancer Brother   . Alcohol abuse Maternal Grandfather   . Alcohol abuse Sister   . Esophageal cancer Neg Hx   . Stomach cancer Neg Hx   . Rectal cancer Neg Hx     Allergies  Allergen Reactions  . Morphine And Related Hives    Medication list has been reviewed and updated.  Current Outpatient Medications on File Prior to Visit  Medication Sig Dispense Refill  . HYDROcodone-acetaminophen (NORCO/VICODIN) 5-325 MG tablet Take 1-2 tablets by mouth every 6 hours as needed for pain.  Time for office visit please 90 tablet 0  . Propylene Glycol (SYSTANE BALANCE OP) Apply 1 drop to eye daily as needed (dry eyes).     . traZODone (DESYREL) 50 MG tablet TAKE 1-1&1/2 TABLETS AT BEDTIME AS NEEDED FOR SLEEP. 45 tablet 2   No current facility-administered medications on file prior to visit.     Review of Systems:  As per HPI- otherwise negative. No fever or chills No CP or SOB   Physical Examination: Vitals:   03/11/18 1138  BP: 128/64  Pulse: 63  Resp: 16  SpO2: 99%   Vitals:   03/11/18 1138  Weight: 130 lb 6.4 oz (59.1 kg)  Height: 5\' 4"  (1.626 m)   Body mass index  is 22.38 kg/m. Ideal Body Weight: Weight in (lb) to have BMI = 25: 145.3  GEN: WDWN, NAD, Non-toxic, A & O x 3  Bilateral TM wnl, oropharynx normal.  PEERL,EOMI.   No significant cerumen in her ears today HEENT: Atraumatic, Normocephalic. Neck supple. No masses, No LAD. Ears and Nose: No  external deformity. CV: RRR, No M/G/R. No JVD. No thrill. No extra heart sounds. PULM: CTA B, no wheezes, crackles, rhonchi. No retractions. No resp. distress. No accessory muscle use. EXTR: No c/c/e NEURO Normal gait.  PSYCH: Normally interactive. Conversant. Not depressed or anxious appearing.  Calm demeanor.  Mild rhus derm on her bilateral forearms today.  Suggested calamine, otc steroid creams    Assessment and Plan: Screening for deficiency anemia - Plan: CBC  Screening for diabetes mellitus - Plan: Comprehensive metabolic panel  Dyslipidemia - Plan: Lipid panel  Chronic back pain, unspecified back location, unspecified back pain laterality - Plan: Pain Mgmt, Profile 8 w/Conf, U  Neck pain - Plan: HYDROcodone-acetaminophen (NORCO/VICODIN) 5-325 MG tablet  Scoliosis (and kyphoscoliosis), idiopathic - Plan: HYDROcodone-acetaminophen (NORCO/VICODIN) 5-325 MG tablet  Chronic bilateral thoracic back pain - Plan: HYDROcodone-acetaminophen (NORCO/VICODIN) 5-325 MG tablet  Following up today Await her labs She uses hydrocodone as needed for pain in her neck and back- refilled today. Will do annual UDS today Reassured that there is no earwax impacted today  Signed Lamar Blinks, MD  Received her labs Results for orders placed or performed in visit on 03/11/18  CBC  Result Value Ref Range   WBC 5.3 4.0 - 10.5 K/uL   RBC 4.11 3.87 - 5.11 Mil/uL   Platelets 238.0 150.0 - 400.0 K/uL   Hemoglobin 13.7 12.0 - 15.0 g/dL   HCT 40.1 36.0 - 46.0 %   MCV 97.6 78.0 - 100.0 fl   MCHC 34.1 30.0 - 36.0 g/dL   RDW 12.8 11.5 - 15.5 %  Comprehensive metabolic panel  Result Value Ref Range   Sodium  140 135 - 145 mEq/L   Potassium 5.9 (H) 3.5 - 5.1 mEq/L   Chloride 104 96 - 112 mEq/L   CO2 31 19 - 32 mEq/L   Glucose, Bld 96 70 - 99 mg/dL   BUN 17 6 - 23 mg/dL   Creatinine, Ser 0.83 0.40 - 1.20 mg/dL   Total Bilirubin 0.7 0.2 - 1.2 mg/dL   Alkaline Phosphatase 58 39 - 117 U/L   AST 19 0 - 37 U/L   ALT 14 0 - 35 U/L   Total Protein 6.7 6.0 - 8.3 g/dL   Albumin 4.5 3.5 - 5.2 g/dL   Calcium 9.9 8.4 - 10.5 mg/dL   GFR 71.44 >60.00 mL/min  Lipid panel  Result Value Ref Range   Cholesterol 227 (H) 0 - 200 mg/dL   Triglycerides 88.0 0.0 - 149.0 mg/dL   HDL 73.30 >39.00 mg/dL   VLDL 17.6 0.0 - 40.0 mg/dL   LDL Cholesterol 136 (H) 0 - 99 mg/dL   Total CHOL/HDL Ratio 3    NonHDL 153.72    Cholesterol is overall good Noted high K-likely spurious Message to pt: Your blood counts are normal Cholesterol is overall very good Metabolic profile is normal except for elevated potassium.  I suspect this is due to trauma to the red blood cells during processing which has caused your potassium level to be falsely elevated.  However, we do need to make sure of this as high potassium needs to be treated.  I will order a repeat potassium level for you; please come by the clinic at your convenience within the week and we will recheck this for you.  Otherwise we can plan to visit in 6 months Let me know if any questions!

## 2018-03-11 ENCOUNTER — Encounter: Payer: Self-pay | Admitting: Family Medicine

## 2018-03-11 ENCOUNTER — Ambulatory Visit (INDEPENDENT_AMBULATORY_CARE_PROVIDER_SITE_OTHER): Payer: Medicare Other | Admitting: Family Medicine

## 2018-03-11 VITALS — BP 128/64 | HR 63 | Resp 16 | Ht 64.0 in | Wt 130.4 lb

## 2018-03-11 DIAGNOSIS — Z130 Encounter for screening for diseases of the blood and blood-forming organs and certain disorders involving the immune mechanism: Secondary | ICD-10-CM | POA: Diagnosis not present

## 2018-03-11 DIAGNOSIS — E875 Hyperkalemia: Secondary | ICD-10-CM

## 2018-03-11 DIAGNOSIS — M549 Dorsalgia, unspecified: Secondary | ICD-10-CM

## 2018-03-11 DIAGNOSIS — M4120 Other idiopathic scoliosis, site unspecified: Secondary | ICD-10-CM

## 2018-03-11 DIAGNOSIS — G8929 Other chronic pain: Secondary | ICD-10-CM

## 2018-03-11 DIAGNOSIS — M542 Cervicalgia: Secondary | ICD-10-CM

## 2018-03-11 DIAGNOSIS — Z131 Encounter for screening for diabetes mellitus: Secondary | ICD-10-CM

## 2018-03-11 DIAGNOSIS — E785 Hyperlipidemia, unspecified: Secondary | ICD-10-CM | POA: Diagnosis not present

## 2018-03-11 DIAGNOSIS — M546 Pain in thoracic spine: Secondary | ICD-10-CM

## 2018-03-11 LAB — COMPREHENSIVE METABOLIC PANEL
ALT: 14 U/L (ref 0–35)
AST: 19 U/L (ref 0–37)
Albumin: 4.5 g/dL (ref 3.5–5.2)
Alkaline Phosphatase: 58 U/L (ref 39–117)
BUN: 17 mg/dL (ref 6–23)
CO2: 31 mEq/L (ref 19–32)
Calcium: 9.9 mg/dL (ref 8.4–10.5)
Chloride: 104 mEq/L (ref 96–112)
Creatinine, Ser: 0.83 mg/dL (ref 0.40–1.20)
GFR: 71.44 mL/min (ref >60.00)
Glucose, Bld: 96 mg/dL (ref 70–99)
Potassium: 5.9 mEq/L — ABNORMAL HIGH (ref 3.5–5.1)
Sodium: 140 mEq/L (ref 135–145)
Total Bilirubin: 0.7 mg/dL (ref 0.2–1.2)
Total Protein: 6.7 g/dL (ref 6.0–8.3)

## 2018-03-11 LAB — LIPID PANEL
Cholesterol: 227 mg/dL — ABNORMAL HIGH (ref 0–200)
HDL: 73.3 mg/dL (ref >39.00)
LDL Cholesterol: 136 mg/dL — ABNORMAL HIGH (ref 0–99)
NonHDL: 153.72
Total CHOL/HDL Ratio: 3
Triglycerides: 88 mg/dL (ref 0.0–149.0)
VLDL: 17.6 mg/dL (ref 0.0–40.0)

## 2018-03-11 LAB — CBC
HCT: 40.1 % (ref 36.0–46.0)
Hemoglobin: 13.7 g/dL (ref 12.0–15.0)
MCHC: 34.1 g/dL (ref 30.0–36.0)
MCV: 97.6 fl (ref 78.0–100.0)
Platelets: 238 10*3/uL (ref 150.0–400.0)
RBC: 4.11 Mil/uL (ref 3.87–5.11)
RDW: 12.8 % (ref 11.5–15.5)
WBC: 5.3 10*3/uL (ref 4.0–10.5)

## 2018-03-11 MED ORDER — HYDROCODONE-ACETAMINOPHEN 5-325 MG PO TABS: ORAL_TABLET | ORAL | 0 refills | Status: DC

## 2018-03-11 NOTE — Patient Instructions (Signed)
It was very nice to see you today - take care and best of luck with your shoulder!  I will be in touch with your labs asap  Please see me in about 6 months for a recheck

## 2018-03-14 LAB — PAIN MGMT, PROFILE 8 W/CONF, U
6 Acetylmorphine: NEGATIVE ng/mL (ref <10)
Alcohol Metabolites: POSITIVE ng/mL — AB (ref <500)
Amphetamines: NEGATIVE ng/mL (ref <500)
Benzodiazepines: NEGATIVE ng/mL (ref <100)
Buprenorphine, Urine: NEGATIVE ng/mL (ref <5)
Cocaine Metabolite: NEGATIVE ng/mL (ref <150)
Codeine: NEGATIVE ng/mL (ref <50)
Creatinine: 68.8 mg/dL
Ethyl Glucuronide (ETG): NEGATIVE ng/mL (ref <500)
Ethyl Sulfate (ETS): 312 ng/mL — ABNORMAL HIGH (ref <100)
Hydrocodone: NEGATIVE ng/mL (ref <50)
Hydromorphone: 167 ng/mL — ABNORMAL HIGH (ref <50)
MDMA: NEGATIVE ng/mL (ref <500)
Marijuana Metabolite: NEGATIVE ng/mL (ref <20)
Morphine: NEGATIVE ng/mL (ref <50)
Norhydrocodone: 129 ng/mL — ABNORMAL HIGH (ref <50)
Opiates: POSITIVE ng/mL — AB (ref <100)
Oxidant: NEGATIVE ug/mL (ref <200)
Oxycodone: NEGATIVE ng/mL (ref <100)
pH: 7.33 (ref 4.5–9.0)

## 2018-03-17 ENCOUNTER — Encounter: Payer: Self-pay | Admitting: Family Medicine

## 2018-03-17 DIAGNOSIS — Z1231 Encounter for screening mammogram for malignant neoplasm of breast: Secondary | ICD-10-CM | POA: Diagnosis not present

## 2018-03-17 DIAGNOSIS — Z8542 Personal history of malignant neoplasm of other parts of uterus: Secondary | ICD-10-CM | POA: Diagnosis not present

## 2018-03-17 DIAGNOSIS — Z01419 Encounter for gynecological examination (general) (routine) without abnormal findings: Secondary | ICD-10-CM | POA: Diagnosis not present

## 2018-03-21 DIAGNOSIS — S8001XA Contusion of right knee, initial encounter: Secondary | ICD-10-CM | POA: Diagnosis not present

## 2018-03-21 DIAGNOSIS — M25561 Pain in right knee: Secondary | ICD-10-CM | POA: Diagnosis not present

## 2018-04-07 DIAGNOSIS — H2513 Age-related nuclear cataract, bilateral: Secondary | ICD-10-CM | POA: Diagnosis not present

## 2018-04-07 DIAGNOSIS — H10413 Chronic giant papillary conjunctivitis, bilateral: Secondary | ICD-10-CM | POA: Diagnosis not present

## 2018-04-07 DIAGNOSIS — H18453 Nodular corneal degeneration, bilateral: Secondary | ICD-10-CM | POA: Diagnosis not present

## 2018-04-07 DIAGNOSIS — H04123 Dry eye syndrome of bilateral lacrimal glands: Secondary | ICD-10-CM | POA: Diagnosis not present

## 2018-04-07 DIAGNOSIS — H01025 Squamous blepharitis left lower eyelid: Secondary | ICD-10-CM | POA: Diagnosis not present

## 2018-04-07 DIAGNOSIS — H01024 Squamous blepharitis left upper eyelid: Secondary | ICD-10-CM | POA: Diagnosis not present

## 2018-04-07 DIAGNOSIS — H01021 Squamous blepharitis right upper eyelid: Secondary | ICD-10-CM | POA: Diagnosis not present

## 2018-04-07 DIAGNOSIS — H01022 Squamous blepharitis right lower eyelid: Secondary | ICD-10-CM | POA: Diagnosis not present

## 2018-04-16 ENCOUNTER — Telehealth: Payer: Self-pay | Admitting: Family Medicine

## 2018-04-16 NOTE — Telephone Encounter (Signed)
Copied from [place] [id number]. Topic: Quick Communication - See Telephone Encounter >> Apr 16, 2018 10:03 AM Burchel, Abbi R wrote: CRM for notification. See Telephone encounter for: 04/16/18.  Pt spoke to billing about some labs that were not covered by medicare.  Pt states she was instructed to call the office and have them take a look at the codes used to see if we can get her insurance to pay for the Metabolic Panel.  Please advise.   Pt: [phone]

## 2018-04-16 NOTE — Telephone Encounter (Signed)
Sent for coding review

## 2018-04-18 DIAGNOSIS — A499 Bacterial infection, unspecified: Secondary | ICD-10-CM | POA: Diagnosis not present

## 2018-04-18 DIAGNOSIS — R3915 Urgency of urination: Secondary | ICD-10-CM | POA: Diagnosis not present

## 2018-04-18 DIAGNOSIS — N390 Urinary tract infection, site not specified: Secondary | ICD-10-CM | POA: Diagnosis not present

## 2018-04-21 DIAGNOSIS — L218 Other seborrheic dermatitis: Secondary | ICD-10-CM | POA: Diagnosis not present

## 2018-04-21 DIAGNOSIS — K130 Diseases of lips: Secondary | ICD-10-CM | POA: Diagnosis not present

## 2018-05-01 ENCOUNTER — Other Ambulatory Visit: Payer: Self-pay

## 2018-06-12 ENCOUNTER — Ambulatory Visit: Payer: Medicare Other | Admitting: Gynecologic Oncology

## 2018-06-16 DIAGNOSIS — L821 Other seborrheic keratosis: Secondary | ICD-10-CM | POA: Diagnosis not present

## 2018-06-16 DIAGNOSIS — K130 Diseases of lips: Secondary | ICD-10-CM | POA: Diagnosis not present

## 2018-06-16 DIAGNOSIS — L570 Actinic keratosis: Secondary | ICD-10-CM | POA: Diagnosis not present

## 2018-07-08 ENCOUNTER — Encounter: Payer: Self-pay | Admitting: Family Medicine

## 2018-07-10 ENCOUNTER — Inpatient Hospital Stay: Payer: Medicare Other | Attending: Gynecologic Oncology | Admitting: Gynecologic Oncology

## 2018-07-10 ENCOUNTER — Encounter: Payer: Self-pay | Admitting: Gynecologic Oncology

## 2018-07-10 VITALS — BP 143/62 | HR 58 | Temp 97.9°F | Resp 20 | Ht 63.5 in | Wt 131.0 lb

## 2018-07-10 DIAGNOSIS — Z90710 Acquired absence of both cervix and uterus: Secondary | ICD-10-CM

## 2018-07-10 DIAGNOSIS — Z87891 Personal history of nicotine dependence: Secondary | ICD-10-CM

## 2018-07-10 DIAGNOSIS — C541 Malignant neoplasm of endometrium: Secondary | ICD-10-CM

## 2018-07-10 DIAGNOSIS — Z90722 Acquired absence of ovaries, bilateral: Secondary | ICD-10-CM

## 2018-07-10 DIAGNOSIS — Z923 Personal history of irradiation: Secondary | ICD-10-CM

## 2018-07-10 NOTE — Progress Notes (Signed)
FOLLOW UP VISIT  Assessment:    72 y.o. 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   S/p robotic hysterectomy, BSO, sentinel lymph node biopsy on 09/26/15 with high intermediate risk factors for recurrence, s/p vaginal brachytherapy adjuvant therapy completed in March, 2017.  No evidence of recurrence on today's exam.  Occasional deep pelvic pains, no associated mass on exam.   Plan: Discussed symptoms concerning for recurrence and recommendation to return to see us immediately should they develop. If her abdominal pains persist or become worse, she will notify us and we will order a CT scan.  Return to clinic in 6 months.  HPI:  Dawn Powers is a 72 y.o. year old initially seen in consultation on 09/08/15 referred by Dr Meissinger for grade 2 endometrial cancer.  She then underwent a robotic hysterectomy, BSO and bilateral SLN biopsy on 09/26/15 without complications.  Her postoperative course was uncomplicated.  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 MSI stable. She was recommended adjuvant vaginal brachytherapy for high/intermediate risk factors.  She completed vaginal brachytherapy in March, 2017.  She is seen today for a routine surveillance visit. She has no complaints concerning for recurrence. She is using her vaginal dilator but is not sexually active. No bleeding.  Her brothers have been diagnosed with prostate and renal cancer.   Interval Hx:  She reports intermittent deep pelvic central "twinges" that she notices some nights. Not of increased frequency.   Past Medical History:  Diagnosis Date  . Arthritis   . Cancer (HCC)    Endometrial   . Heart murmur   . Hyperlipidemia   . Pain    lower back -hx "DDD"  . Radiation 11/15/15, 11/23/15, 11/30/15, 12/07/15, 12/14/15   HDR brachytherapy    Past Surgical History:  Procedure Laterality Date  . COLONOSCOPY    . FRACTURE SURGERY Right    ankle  . GANGLION CYST EXCISION    . LYMPH NODE BIOPSY N/A 09/26/2015   Procedure: SENTINEL LYMPH NODE BIOPSY;  Surgeon: Emma Rossi, MD;  Location: [facility];  Service: Gynecology;  Laterality: N/A;  . ROBOTIC ASSISTED TOTAL HYSTERECTOMY WITH BILATERAL SALPINGO OOPHERECTOMY Bilateral 09/26/2015   Procedure: ROBOTIC ASSISTED TOTAL HYSTERECTOMY WITH BILATERAL SALPINGO OOPHORECTOMY;  Surgeon: Emma Rossi, MD;  Location: [facility];  Service: Gynecology;  Laterality: Bilateral;   Family History  Problem Relation Age of Onset  . Heart disease Mother   . Alcohol abuse Mother        breast cancer - questionable   . Dementia Mother   . Alcohol abuse Father   . Prostate cancer Father 67       prostate and colon- unsure of primary   . Kidney cancer Brother   . Alcohol abuse Maternal Grandfather   . Alcohol abuse Sister   . Esophageal cancer Neg Hx   . Stomach cancer Neg Hx   . Rectal cancer Neg Hx    Social History   Socioeconomic History  . Marital status: Married    Spouse name: Not on file  . Number of children: 2  . Years of education: Not on file  . Highest education level: Not on file  Occupational History  . Not on file  Social Needs  . Financial resource strain: Not on file  . Food insecurity:    Worry: Not on file      Inability: Not on file  . Transportation needs:    Medical: Not on file    Non-medical: Not on file  Tobacco Use  . Smoking status: Former Smoker    Years: 4.00    Types: Cigarettes    Last attempt to quit: 09/30/1998    Years since quitting: 19.7  . Smokeless tobacco: Never Used  Substance and Sexual Activity  . Alcohol use: Yes    Alcohol/week: 3.0 standard drinks    Types: 3 Standard drinks or equivalent per week    Comment: WINE AND BOURBON - daily  . Drug use: No  . Sexual activity: Yes    Partners: Female    Birth control/protection: None  Lifestyle   . Physical activity:    Days per week: Not on file    Minutes per session: Not on file  . Stress: Not on file  Relationships  . Social connections:    Talks on phone: Not on file    Gets together: Not on file    Attends religious service: Not on file    Active member of club or organization: Not on file    Attends meetings of clubs or organizations: Not on file    Relationship status: Not on file  . Intimate partner violence:    Fear of current or ex partner: Not on file    Emotionally abused: Not on file    Physically abused: Not on file    Forced sexual activity: Not on file  Other Topics Concern  . Not on file  Social History Narrative   Married. Education: college and Other. Exercise 4 times a week for 50 minutes.   Current Outpatient Medications on File Prior to Visit  Medication Sig Dispense Refill  . HYDROcodone-acetaminophen (NORCO/VICODIN) 5-325 MG tablet Take 1-2 tablets by mouth every 6 hours as needed for pain. 90 tablet 0  . Propylene Glycol (SYSTANE BALANCE OP) Apply 1 drop to eye daily as needed (dry eyes).     . traZODone (DESYREL) 50 MG tablet TAKE 1-1&1/2 TABLETS AT BEDTIME AS NEEDED FOR SLEEP. 45 tablet 2   No current facility-administered medications on file prior to visit.    Allergies  Allergen Reactions  . Morphine And Related Hives      Review of systems: Constitutional:  She has no weight gain or weight loss. She has no fever or chills. Eyes: No blurred vision Ears, Nose, Mouth, Throat: No dizziness, headaches or changes in hearing. No mouth sores. Cardiovascular: No chest pain, palpitations or edema. Respiratory:  No shortness of breath, wheezing or cough Gastrointestinal: She has normal bowel movements without diarrhea or constipation. She denies any nausea or vomiting. She denies blood in her stool or heart burn. Occasional pelvic twinges. Genitourinary:  She denies pelvic pain, pelvic pressure or changes in her urinary function. She has no  hematuria, dysuria, or incontinence. She has no irregular vaginal bleeding or vaginal discharge Musculoskeletal: Denies muscle weakness or joint pains.  Skin:  She has no skin changes, rashes or itching Neurological: resolved neuropathic back pain. Psychiatric:  She denies depression or anxiety. Hematologic/Lymphatic:   No easy bruising or bleeding   Physical Exam: Blood pressure (!) 143/62, pulse (!) 58, temperature 97.9 F (36.6 C), temperature source Oral, resp. rate 20, height 5' 3.5" (1.613 m), weight 131 lb (59.4 kg), SpO2 98 %. General: Well dressed, well nourished in no apparent distress.   HEENT:  Normocephalic and atraumatic, no lesions.  Extraocular muscles intact. Sclerae anicteric. Pupils   equal, round, reactive. No mouth sores or ulcers. Thyroid is normal size, not nodular, midline. Abdomen:  Soft, nontender, nondistended.  No palpable masses.  No hepatosplenomegaly.  No ascites. Normal bowel sounds.  No hernias.  Incisions are well healed Genitourinary: Normal EGBUS  Vaginal cuff intact.  No bleeding or discharge.  No cul de sac fullness. Mild radiation changes to vaginal mucosa. Extremities: No cyanosis, clubbing or edema.  No calf tenderness or erythema. No palpable cords. Psychiatric: Mood and affect are appropriate. Neurological: Awake, alert and oriented x 3. Sensation is intact, no neuropathy.  Musculoskeletal: No pain, normal strength and range of motion.  Emma C Rossi, MD   

## 2018-07-10 NOTE — Patient Instructions (Signed)
Please notify Dr Denman George at phone number [phone] if you notice vaginal bleeding, new pelvic or abdominal pains, bloating, feeling full easy, or a change in bladder or bowel function.   If you have concerns about the abdominal cramps you feel, please notify Dr Serita Grit office and she will order a CT of your abdomen and pelvis.   Please contact her office at the above number to schedule follow up with her in April of 2020.

## 2018-07-15 DIAGNOSIS — Z23 Encounter for immunization: Secondary | ICD-10-CM | POA: Diagnosis not present

## 2018-08-14 ENCOUNTER — Other Ambulatory Visit (INDEPENDENT_AMBULATORY_CARE_PROVIDER_SITE_OTHER): Payer: Medicare Other

## 2018-08-14 ENCOUNTER — Encounter: Payer: Self-pay | Admitting: Family

## 2018-08-14 ENCOUNTER — Ambulatory Visit: Payer: Self-pay | Admitting: *Deleted

## 2018-08-14 ENCOUNTER — Ambulatory Visit (INDEPENDENT_AMBULATORY_CARE_PROVIDER_SITE_OTHER): Payer: Medicare Other | Admitting: Family

## 2018-08-14 ENCOUNTER — Ambulatory Visit (INDEPENDENT_AMBULATORY_CARE_PROVIDER_SITE_OTHER)
Admission: RE | Admit: 2018-08-14 | Discharge: 2018-08-14 | Disposition: A | Discharge status: Home / Self Care | Payer: Medicare Other | Source: Ambulatory Visit | Attending: Family | Admitting: Family

## 2018-08-14 VITALS — BP 150/78 | HR 79 | Temp 98.1°F | Ht 63.5 in | Wt 135.1 lb

## 2018-08-14 DIAGNOSIS — R635 Abnormal weight gain: Secondary | ICD-10-CM

## 2018-08-14 DIAGNOSIS — R0609 Other forms of dyspnea: Secondary | ICD-10-CM

## 2018-08-14 DIAGNOSIS — R0600 Dyspnea, unspecified: Secondary | ICD-10-CM

## 2018-08-14 DIAGNOSIS — R42 Dizziness and giddiness: Secondary | ICD-10-CM

## 2018-08-14 DIAGNOSIS — R030 Elevated blood-pressure reading, without diagnosis of hypertension: Secondary | ICD-10-CM | POA: Diagnosis not present

## 2018-08-14 DIAGNOSIS — R0602 Shortness of breath: Secondary | ICD-10-CM | POA: Diagnosis not present

## 2018-08-14 LAB — CBC WITH DIFFERENTIAL/PLATELET
Basophils Absolute: 0 10*3/uL (ref 0.0–0.1)
Basophils Relative: 1.1 % (ref 0.0–3.0)
Eosinophils Absolute: 0.1 10*3/uL (ref 0.0–0.7)
Eosinophils Relative: 3.1 % (ref 0.0–5.0)
HCT: 40.1 % (ref 36.0–46.0)
Hemoglobin: 13.6 g/dL (ref 12.0–15.0)
Lymphocytes Relative: 27.3 % (ref 12.0–46.0)
Lymphs Abs: 1.2 10*3/uL (ref 0.7–4.0)
MCHC: 33.9 g/dL (ref 30.0–36.0)
MCV: 95.5 fl (ref 78.0–100.0)
Monocytes Absolute: 0.4 10*3/uL (ref 0.1–1.0)
Monocytes Relative: 9.1 % (ref 3.0–12.0)
Neutro Abs: 2.5 10*3/uL (ref 1.4–7.7)
Neutrophils Relative %: 59.4 % (ref 43.0–77.0)
Platelets: 243 10*3/uL (ref 150.0–400.0)
RBC: 4.21 Mil/uL (ref 3.87–5.11)
RDW: 13.2 % (ref 11.5–15.5)
WBC: 4.3 10*3/uL (ref 4.0–10.5)

## 2018-08-14 LAB — COMPREHENSIVE METABOLIC PANEL
ALT: 20 U/L (ref 0–35)
AST: 26 U/L (ref 0–37)
Albumin: 4.5 g/dL (ref 3.5–5.2)
Alkaline Phosphatase: 63 U/L (ref 39–117)
BUN: 18 mg/dL (ref 6–23)
CO2: 29 mEq/L (ref 19–32)
Calcium: 9.7 mg/dL (ref 8.4–10.5)
Chloride: 104 mEq/L (ref 96–112)
Creatinine, Ser: 0.86 mg/dL (ref 0.40–1.20)
GFR: 68.49 mL/min (ref >60.00)
Glucose, Bld: 132 mg/dL — ABNORMAL HIGH (ref 70–99)
Potassium: 3.6 mEq/L (ref 3.5–5.1)
Sodium: 142 mEq/L (ref 135–145)
Total Bilirubin: 0.4 mg/dL (ref 0.2–1.2)
Total Protein: 7.2 g/dL (ref 6.0–8.3)

## 2018-08-14 LAB — TSH: TSH: 3.36 u[IU]/mL (ref 0.35–4.50)

## 2018-08-14 MED ORDER — HYDROCHLOROTHIAZIDE 12.5 MG PO CAPS: 12.5000 mg | ORAL_CAPSULE | Freq: Every day | ORAL | 0 refills | Status: DC

## 2018-08-14 NOTE — Patient Instructions (Signed)
Please schedule follow-up with Dr. Lorelei Pont for early next week

## 2018-08-14 NOTE — Telephone Encounter (Signed)
Pt calling because she is concerned about her blood pressure. States that at home machine is reading 177/70 - she is going to go to a local pharmacy to see if there is a machine there she can test on. Pt not complaining of any other symptoms. States she is getting ready to have shoulder repair surgery and wants to know if that could raise blood pressure.          Called patient regarding the message above. She is on her was to the pharmacy now to recheck her b/p and asking for a call back in about 30 mins.  Will check back with her at that point.

## 2018-08-14 NOTE — Progress Notes (Signed)
Dawn Powers is a 72 y.o. female with the following history as recorded in [employer]:  Patient Active Problem List   Diagnosis Date Noted  . Endometrial cancer ([place]) 09/26/2015  . Essential hypertension 05/23/2015  . Thoracic facet joint syndrome 04/27/2014  . Back pain 03/24/2014  . Chronic pain syndrome 03/24/2014  . Scoliosis (and kyphoscoliosis), idiopathic 12/22/2012  . Osteopenia 12/22/2012  . Other and unspecified hyperlipidemia 12/22/2012    Current Outpatient Medications  Medication Sig Dispense Refill  . Propylene Glycol (SYSTANE BALANCE OP) Apply 1 drop to eye daily as needed (dry eyes).     . traZODone (DESYREL) 50 MG tablet TAKE 1-1&1/2 TABLETS AT BEDTIME AS NEEDED FOR SLEEP. 45 tablet 2  . FLUAD 0.5 ML SUSY TO BE ADMINISTERED BY PHARMACIST FOR IMMUNIZATION  0  . hydrochlorothiazide (MICROZIDE) 12.5 MG capsule Take 1 capsule (12.5 mg total) by mouth daily. 30 capsule 0  . HYDROcodone-acetaminophen (NORCO/VICODIN) 5-325 MG tablet Take 1-2 tablets by mouth every 6 hours as needed for pain. (Patient not taking: Reported on 08/14/2018) 90 tablet 0  . Influenza vac split quadrivalent PF (FLUZONE HIGH-DOSE) 0.5 ML injection Fluzone High-Dose 2014-15 (PF) 180 mcg/0.5 mL intramuscular syringe  TO BE ADMINISTERED BY PHARMACIST FOR IMMUNIZATION     No current facility-administered medications for this visit.     Allergies: Morphine and related  Past Medical History:  Diagnosis Date  . Arthritis   . Cancer [facility])    Endometrial   . Heart murmur   . Hyperlipidemia   . Pain    lower back -hx "DDD"  . Radiation 11/15/15, 11/23/15, 11/30/15, 12/07/15, 12/14/15   HDR brachytherapy    Past Surgical History:  Procedure Laterality Date  . COLONOSCOPY    . FRACTURE SURGERY Right    ankle  . GANGLION CYST EXCISION    . LYMPH NODE BIOPSY N/A 09/26/2015   Procedure: SENTINEL LYMPH NODE BIOPSY;  Surgeon: Everitt Amber, MD;  Location: [facility];  Service: Gynecology;  Laterality: N/A;  .  ROBOTIC ASSISTED TOTAL HYSTERECTOMY WITH BILATERAL SALPINGO OOPHERECTOMY Bilateral 09/26/2015   Procedure: ROBOTIC ASSISTED TOTAL HYSTERECTOMY WITH BILATERAL SALPINGO OOPHORECTOMY;  Surgeon: Everitt Amber, MD;  Location: [facility];  Service: Gynecology;  Laterality: Bilateral;    Family History  Problem Relation Age of Onset  . Heart disease Mother   . Alcohol abuse Mother        breast cancer - questionable   . Dementia Mother   . Alcohol abuse Father   . Prostate cancer Father 93       prostate and colon- unsure of primary   . Kidney cancer Brother   . Alcohol abuse Maternal Grandfather   . Alcohol abuse Sister   . Esophageal cancer Neg Hx   . Stomach cancer Neg Hx   . Rectal cancer Neg Hx     Social History   Tobacco Use  . Smoking status: Former Smoker    Years: 4.00    Types: Cigarettes    Last attempt to quit: 09/30/1998    Years since quitting: 19.8  . Smokeless tobacco: Never Used  Substance Use Topics  . Alcohol use: Yes    Alcohol/week: 3.0 standard drinks    Types: 3 Standard drinks or equivalent per week    Comment: WINE AND BOURB[place] - daily    Subjective:  Patient presents with concerns for elevated blood pressure today; was apparently very high when she went to the pharmacy earlier today- readings noted to be 184/100;  has been watching her blood pressure recently- notes that has been averaging 150-170 at home; today, her blood pressure cuff would not register- gave error messages and opted to go to pharmacy; admits was very anxious/ upset by the time she got to the pharmacy- feels this could explain some of the extreme elevation seen today; denies any chest pain, shortness of breath or blurred vision; mentions that she has had problems with dizziness "for a while"- history of vertigo; feels that she has gained 10 pounds in the past 4 months- exercising is daily/ may be eating more junk food recently; does have occasional sensation of palpitations- notes that these symptoms  have been present "x years." Has noticed recently that she feels more winded with activity;      Objective:  Vitals:   08/14/18 1508  BP: (!) 150/78  Pulse: 79  Temp: 98.1 F (36.7 C)  TempSrc: Oral  SpO2: 97%  Weight: 135 lb 1.3 oz (61.3 kg)  Height: 5' 3.5" (1.613 m)    General: Well developed, well nourished, in no acute distress  Skin : Warm and dry.  Head: Normocephalic and atraumatic  Eyes: Sclera and conjunctiva clear; pupils round and reactive to light; extraocular movements intact  Ears: External normal; canals clear; tympanic membranes normal  Oropharynx: Pink, supple. No suspicious lesions  Neck: Supple without thyromegaly, adenopathy  Lungs: Respirations unlabored; clear to auscultation bilaterally without wheeze, rales, rhonchi  CVS exam: normal rate and regular rhythm.  Neurologic: Alert and oriented; speech intact; face symmetrical; moves all extremities well; CNII-XII intact without focal deficit   Assessment:  1. Dizziness   2. Weight gain   3. Elevated blood pressure reading   4. Dyspnea on exertion     Plan:  Patient's history indicates that she has had history of hypertension but patient notes she has never taken blood pressure medication; will update EKG today-NSR; will update labs and CXR today; will give trial of HCTZ 12.5 mg daily to see how blood pressure responds.   Have asked patient to plan to see her PCP in follow-up and take her blood pressure log/ cuff the follow-up visit.   No follow-ups on file.  Orders Placed This Encounter  Procedures  . DG Chest 2 View    Standing Status:   Future    Standing Expiration Date:   10/15/2019    Order Specific Question:   Reason for Exam (SYMPTOM  OR DIAGNOSIS REQUIRED)    Answer:   dyspnea on exertion    Order Specific Question:   Preferred imaging location?    Answer:   [facility]    Order Specific Question:   Radiology Contrast Protocol - do NOT remove file path    Answer:    [email or web address]  . CBC w/Diff    Standing Status:   Future    Standing Expiration Date:   08/14/2019  . Comp Met (CMET)    Standing Status:   Future    Standing Expiration Date:   08/14/2019  . TSH    Standing Status:   Future    Standing Expiration Date:   08/14/2019  . EKG 12-Lead    Requested Prescriptions   Signed Prescriptions Disp Refills  . hydrochlorothiazide (MICROZIDE) 12.5 MG capsule 30 capsule 0    Sig: Take 1 capsule (12.5 mg total) by mouth daily.

## 2018-08-14 NOTE — Telephone Encounter (Signed)
Pt calling to report that BP taken by pharmacist today was 184/100 and 193/93 in both arms today. Pt denies chest pain, headache, dizziness weakness, change in vision or weakness. Pt states she is not on any BP medications. Pt states that her BP was in the 170s a couple of weeks ago. Pt states she does monitor her BP at home and is not on any medications and has not been diagnosed with HTN. Pt states she is going to have surgery on her shoulder but denies any increase in pain recently.Pt states when she was taking her BP at home today her BP machine was reading an error which is why she went to the pharmacy to recheck her BP. No appt availability with PCP or other providers at that location. Advised that she may need to seek treatment in the ED but pt states she will not go to the ED. Pt states she lives in [place] and pt offered to make appt at [facility] location due to it being close to her home. Pt scheduled for appt at 3 pm at [facility] location.  Reason for Disposition . Systolic BP  >= 419 OR Diastolic >= 379  Answer Assessment - Initial Assessment Questions 1. BLOOD PRESSURE: "What is the blood pressure?" "Did you take at least two measurements 5 minutes apart?"     184/100 and 193/93 2. ONSET: "When did you take your blood pressure?"     today 3. HOW: "How did you obtain the blood pressure?" (e.g., visiting nurse, automatic home BP monitor)     BP taken by pharmacist 4. HISTORY: "Do you have a history of high blood pressure?"     no 5. MEDICATIONS: "Are you taking any medications for blood pressure?" "Have you missed any doses recently?"     No 6. OTHER SYMPTOMS: "Do you have any symptoms?" (e.g., headache, chest pain, blurred vision, difficulty breathing, weakness)    No 7. PREGNANCY: "Is there any chance you are pregnant?" "When was your last menstrual period?"     N/a  Protocols used: HIGH BLOOD PRESSURE-A-AH

## 2018-08-17 ENCOUNTER — Other Ambulatory Visit: Payer: Self-pay

## 2018-08-18 NOTE — Progress Notes (Signed)
[facility] [place], [place], [place] [address] [phone]  Date:  08/19/2018   Name:  Dawn Powers   DOB:  [birth year]   MRN:  [id number]  PCP:  Darreld Mclean, MD    Chief Complaint: Dizziness (follow up, seen at [facility] 11/15, hypertension, 193/93 at pharmacy, brought readings, shoulder replacement in 3 weeks, vertigo at night, worse when  laying down at ) and Ear Issue (right ear, making noises at night)   History of Present Illness:  Dawn Powers is a 72 y.o. very pleasant female patient who presents with the following:  History of endometrial cancer,borderline HTN, chronic pain and back problems She saw her GYN/ONC, Dr. Denman George, last month: Assessment:    72 y.o. 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   S/p robotic hysterectomy, BSO, sentinel lymph node biopsy on 09/26/15 with high intermediate risk factors for recurrence, s/p vaginal brachytherapy adjuvant therapy completed in March, 2017. No evidence of recurrence on today's exam. Occasional deep pelvic pains, no associated mass on exam.  Plan: Discussed symptoms concerning for recurrence and recommendation to return to see [place] immediately should they develop. If her abdominal pains persist or become worse, she will notify us and we will order a CT scan. Return to clinic in 6 months.  She also recently presented to [facility] and was started on HCTZ for elevated BP - note from 11/15;  Patient presents with concerns for elevated blood pressure today; was apparently very high when she went to the pharmacy earlier today- readings noted to be 184/100; has been watching her blood pressure recently- notes that has been averaging 150-170 at home; today, her blood pressure cuff would not register- gave error messages and opted to go to pharmacy; admits was very anxious/ upset by the time she got to the pharmacy- feels this could explain  some of the extreme elevation seen today; denies any chest pain, shortness of breath or blurred vision; mentions that she has had problems with dizziness "for a while"- history of vertigo; feels that she has gained 10 pounds in the past 4 months- exercising is daily/ may be eating more junk food recently; does have occasional sensation of palpitations- notes that these symptoms have been present "x years." Has noticed recently that she feels more winded with activity//////////////////////// 1. Dizziness   2. Weight gain   3. Elevated blood pressure reading   4. Dyspnea on exertion    Plan:  Patient's history indicates that she has had history of hypertension but patient notes she has never taken blood pressure medication; will update EKG today-NSR; will update labs and CXR today; will give trial of HCTZ 12.5 mg daily to see how blood pressure responds.  Have asked patient to plan to see her PCP in follow-up and take her blood pressure log/ cuff the follow-up visit.   Chest film and labs were ok from that visit  The highest BP she got was 193/93 She is tolerating the hctz 12.5just fine,no SE noted She is having shoulder surgery -replacement- next month per Dr. Onnie Graham She is continuing to monitor her BP at home and her cuff seems accurate measured against ours today Her home readings are still running slightly high   She did smoke briefly  BP Readings from Last 3 Encounters:  08/19/18 (!) 150/90  08/14/18 (!) 150/78  07/10/18 (!) 143/62     Patient Active  Problem List   Diagnosis Date Noted  . Endometrial cancer ([place]) 09/26/2015  . Essential hypertension 05/23/2015  . Thoracic facet joint syndrome 04/27/2014  . Back pain 03/24/2014  . Chronic pain syndrome 03/24/2014  . Scoliosis (and kyphoscoliosis), idiopathic 12/22/2012  . Osteopenia 12/22/2012  . Other and unspecified hyperlipidemia 12/22/2012    Past Medical History:  Diagnosis Date  . Arthritis   . Cancer [facility])     Endometrial   . Heart murmur   . Hyperlipidemia   . Pain    lower back -hx "DDD"  . Radiation 11/15/15, 11/23/15, 11/30/15, 12/07/15, 12/14/15   HDR brachytherapy    Past Surgical History:  Procedure Laterality Date  . COLONOSCOPY    . FRACTURE SURGERY Right    ankle  . GANGLION CYST EXCISION    . LYMPH NODE BIOPSY N/A 09/26/2015   Procedure: SENTINEL LYMPH NODE BIOPSY;  Surgeon: Everitt Amber, MD;  Location: [facility];  Service: Gynecology;  Laterality: N/A;  . ROBOTIC ASSISTED TOTAL HYSTERECTOMY WITH BILATERAL SALPINGO OOPHERECTOMY Bilateral 09/26/2015   Procedure: ROBOTIC ASSISTED TOTAL HYSTERECTOMY WITH BILATERAL SALPINGO OOPHORECTOMY;  Surgeon: Everitt Amber, MD;  Location: [facility];  Service: Gynecology;  Laterality: Bilateral;    Social History   Tobacco Use  . Smoking status: Former Smoker    Years: 4.00    Types: Cigarettes    Last attempt to quit: 09/30/1998    Years since quitting: 19.8  . Smokeless tobacco: Never Used  Substance Use Topics  . Alcohol use: Yes    Alcohol/week: 3.0 standard drinks    Types: 3 Standard drinks or equivalent per week    Comment: WINE [employer] [place]OURB[place] - daily  . Drug use: No    Family History  Problem Relation Age of Onset  . Heart disease Mother   . Alcohol abuse Mother        breast cancer - questionable   . Dementia Mother   . Alcohol abuse Father   . Prostate cancer Father 48       prostate and colon- unsure of primary   . Kidney cancer Brother   . Alcohol abuse Maternal Grandfather   . Alcohol abuse Sister   . Esophageal cancer Neg Hx   . Stomach cancer Neg Hx   . Rectal cancer Neg Hx     Allergies  Allergen Reactions  . Morphine And Related Hives    Medication list has been reviewed and updated.  Current Outpatient Medications on File Prior to Visit  Medication Sig Dispense Refill  . FLUAD 0.5 ML SUSY TO BE ADMINISTERED BY PHARMACIST FOR IMMUNIZATION  0  . hydrochlorothiazide (MICROZIDE) 12.5 MG capsule Take 1 capsule (12.5 mg  total) by mouth daily. 30 capsule 0  . Influenza vac split quadrivalent PF (FLUZONE HIGH-DOSE) 0.5 ML injection Fluzone High-Dose 2014-15 (PF) 180 mcg/0.5 mL intramuscular syringe  TO BE ADMINISTERED BY PHARMACIST FOR IMMUNIZATION    . Propylene Glycol (SYSTANE BALANCE OP) Apply 1 drop to eye daily as needed (dry eyes).     . traZODone (DESYREL) 50 MG tablet TAKE 1-1&1/2 TABLETS AT BEDTIME AS NEEDED FOR SLEEP. 45 tablet 2   No current facility-administered medications on file prior to visit.     Review of Systems:  As per HPI- otherwise negative. No urinary sx Occasional pm leg cramps but these pre-date HCTZ use    Physical Examination: Vitals:   08/19/18 1600  BP: (!) 150/90  Pulse: 70  Resp: 16  Temp: 97.8 F (36.6  C)  SpO2: 98%   Vitals:   08/19/18 1600  Weight: 131 lb 9.6 oz (59.7 kg)  Height: 5' 3.5" (1.613 m)   Body mass index is 22.95 kg/m. Ideal Body Weight: Weight in (lb) to have BMI = 25: 143.1  GEN: WDWN, NAD, Non-toxic, A & O x 3,looks well, normal weight HEENT: Atraumatic, Normocephalic. Neck supple. No masses, No LAD. Ears and Nose: No external deformity. CV: RRR, No M/G/R. No JVD. No thrill. No extra heart sounds. PULM: CTA B, no wheezes, crackles, rhonchi. No retractions. No resp. distress. No accessory muscle use. EXTR: No c/c/e NEURO Normal gait.  PSYCH: Normally interactive. Conversant. Not depressed or anxious appearing.  Calm demeanor.    Assessment and Plan: Essential hypertension - Plan: lisinopril-hydrochlorothiazide (PRINZIDE,ZESTORETIC) 10-12.5 MG tablet  Change to lisinopril/hctz as her BP is still borderline She will continue to monitor her BP at home and will let me know how it looks   Signed Lamar Blinks, MD

## 2018-08-19 ENCOUNTER — Ambulatory Visit (INDEPENDENT_AMBULATORY_CARE_PROVIDER_SITE_OTHER): Payer: Medicare Other | Admitting: Family Medicine

## 2018-08-19 ENCOUNTER — Encounter: Payer: Self-pay | Admitting: Family Medicine

## 2018-08-19 ENCOUNTER — Encounter

## 2018-08-19 VITALS — BP 150/90 | HR 70 | Temp 97.8°F | Resp 16 | Ht 63.5 in | Wt 131.6 lb

## 2018-08-19 DIAGNOSIS — I10 Essential (primary) hypertension: Secondary | ICD-10-CM | POA: Diagnosis not present

## 2018-08-19 DIAGNOSIS — M19012 Primary osteoarthritis, left shoulder: Secondary | ICD-10-CM | POA: Diagnosis not present

## 2018-08-19 MED ORDER — LISINOPRIL-HYDROCHLOROTHIAZIDE 10-12.5 MG PO TABS: 1.0000 | ORAL_TABLET | Freq: Every day | ORAL | 3 refills | Status: DC

## 2018-08-19 NOTE — Patient Instructions (Addendum)
Let's increase your BP meds to lisinopril with hctz- take this once a day  Please keep me posted about how your BP responds to this regimen I gave you 30 days for now but we can increase to 90 days if this is a good fit  Best of luck with your upcoming shoulder operation

## 2018-08-20 ENCOUNTER — Telehealth: Payer: Self-pay | Admitting: Family Medicine

## 2018-08-20 NOTE — Telephone Encounter (Signed)
Copied from [place] [id number]. Topic: Quick Communication - See Telephone Encounter >> Aug 20, 2018  4:10 PM Vernona Rieger wrote: CRM for notification. See Telephone encounter for: 08/20/18.  Patient said that she took her lisinopril-hydrochlorothiazide (PRINZIDE,ZESTORETIC) 10-12.5 MG tablet at noon today. Before she took her pill, her blood pressure was 150/80ish. She said about two hours later it was 105/63. Right now it is 108/61. She said that Dr Lorelei Pont asked her to call in with this information. She said she doesn't need a call back unless you need more information.

## 2018-08-20 NOTE — Telephone Encounter (Signed)
Called but did not connect with her LMOM- her BP readings are lower than I would really like.  Let's try a 1/2 tablet of her combo pill Gaol is SBP 120- 135/ 70- 85. If 1/2 tablet does not get her here please alert me  JC

## 2018-09-01 ENCOUNTER — Telehealth: Payer: Self-pay | Admitting: Family Medicine

## 2018-09-01 NOTE — Telephone Encounter (Signed)
Copied from [place] [id number]. Topic: Quick Communication - See Telephone Encounter >> Sep 01, 2018  9:54 AM Rutherford Nail, NT wrote: CRM for notification. See Telephone encounter for: 09/01/18. Patient calling and states that she was put on BP medication- lisinopril-hydrochlorothiazide (PRINZIDE,ZESTORETIC) 10-12.5 MG tablet. Getting ready for surgery on 09/10/18. States that the medication has caused a cough that keeps her up at night. States that she can tolerate that until surgery on 09/10/18, but would like to discuss other options afterwards. List of recent BP readings: 08/31/18 morning    136/60     141/61 08/30/18 morning    142/65       08/29/18 evening    140/56   120/52 08/28/18 morning   136/76    127/67  CB#: [phone]

## 2018-09-02 ENCOUNTER — Encounter: Payer: Self-pay | Admitting: Family Medicine

## 2018-09-02 NOTE — Pre-Procedure Instructions (Signed)
Dawn Powers  09/02/2018      [place], [place] [address] Phone: [phone] Fax: [phone]    Your procedure is scheduled on 09/10/2018.  Report to [facility]mitting at [facility] A.[facility].  Call this number if you have problems the morning of surgery:  [phone]   Remember:  Do not eat or drink after midnight.    Take these medicines the morning of surgery with A SIP OF WATER: Eye drops if needed  7 days prior to surgery STOP taking any Aspirin (unless otherwise instructed by your surgeon), Aleve, Naproxen, Ibuprofen, Motrin, Advil, Goody's, BC's, all herbal medications, fish oil, and all vitamins     Do not wear jewelry, make-up or nail polish.  Do not wear lotions, powders, or perfumes, or deodorant.  Do not shave 48 hours prior to surgery.    Do not bring valuables to the hospital.  [facility] is not responsible for any belongings or valuables.  Contacts, dentures or bridgework may not be worn into surgery.  Leave your suitcase in the car.  After surgery it may be brought to your room.  For patients admitted to the hospital, discharge time will be determined by your treatment team.  Patients discharged the day of surgery will not be allowed to drive home.   Name and phone number of your driver:    Special instructions:   [facility]- Preparing For Surgery  Before surgery, you can play an important role. Because skin is not sterile, your skin needs to be as free of germs as possible. You can reduce the number of germs on your skin by washing with CHG (chlorahexidine gluconate) Soap before surgery.  CHG is an antiseptic cleaner which kills germs and bonds with the skin to continue killing germs even after washing.    Oral Hygiene is also important to reduce your risk of infection.  Remember - BRUSH YOUR TEETH THE MORNING OF SURGERY WITH YOUR REGULAR TOOTHPASTE  Please  do not use if you have an allergy to CHG or antibacterial soaps. If your skin becomes reddened/irritated stop using the CHG.  Do not shave (including legs and underarms) for at least 48 hours prior to first CHG shower. It is OK to shave your face.  Please follow these instructions carefully.   1. Shower the NIGHT BEFORE SURGERY and the MORNING OF SURGERY with CHG.   2. If you chose to wash your hair, wash your hair first as usual with your normal shampoo.  3. After you shampoo, rinse your hair and body thoroughly to remove the shampoo.  4. Use CHG as you would any other liquid soap. You can apply CHG directly to the skin and wash gently with a scrungie or a clean washcloth.   5. Apply the CHG Soap to your body ONLY FROM THE NECK DOWN.  Do not use on open wounds or open sores. Avoid contact with your eyes, ears, mouth and genitals (private parts). Wash Face and genitals (private parts)  with your normal soap.  6. Wash thoroughly, paying special attention to the area where your surgery will be performed.  7. Thoroughly rinse your body with warm water from the neck down.  8. DO NOT shower/wash with your normal soap after using and rinsing off the CHG Soap.  9. Pat yourself dry with a CLEAN TOWEL.  10. Wear CLEAN PAJAMAS to bed the night  before surgery, wear comfortable clothes the morning of surgery  11. Place CLEAN SHEETS on your bed the night of your first shower and DO NOT SLEEP WITH PETS.    Day of Surgery:  Do not apply any deodorants/lotions.  Please wear clean clothes to the hospital/surgery center.   Remember to brush your teeth WITH YOUR REGULAR TOOTHPASTE.    Please read over the following fact sheets that you were given. Pain Booklet, Coughing and Deep Breathing, Total Joint Packet, MRSA Information and Surgical Site Infection Prevention

## 2018-09-03 ENCOUNTER — Encounter (HOSPITAL_COMMUNITY)
Admission: RE | Admit: 2018-09-03 | Discharge: 2018-09-03 | Disposition: A | Discharge status: Home / Self Care | Payer: Medicare Other | Source: Ambulatory Visit | Attending: Orthopedic Surgery | Admitting: Orthopedic Surgery

## 2018-09-03 ENCOUNTER — Other Ambulatory Visit: Payer: Self-pay

## 2018-09-03 ENCOUNTER — Encounter (HOSPITAL_COMMUNITY): Payer: Self-pay

## 2018-09-03 DIAGNOSIS — Z01812 Encounter for preprocedural laboratory examination: Secondary | ICD-10-CM | POA: Insufficient documentation

## 2018-09-03 HISTORY — DX: Myoneural disorder, unspecified: G70.9

## 2018-09-03 LAB — COMPREHENSIVE METABOLIC PANEL
ALT: 21 U/L (ref 0–44)
AST: 29 U/L (ref 15–41)
Albumin: 4.3 g/dL (ref 3.5–5.0)
Alkaline Phosphatase: 64 U/L (ref 38–126)
Anion gap: 10 (ref 5–15)
BUN: 14 mg/dL (ref 8–23)
CO2: 28 mmol/L (ref 22–32)
Calcium: 9.8 mg/dL (ref 8.9–10.3)
Chloride: 101 mmol/L (ref 98–111)
Creatinine, Ser: 0.9 mg/dL (ref 0.44–1.00)
GFR calc Af Amer: >60 mL/min (ref >60)
GFR calc non Af Amer: >60 mL/min (ref >60)
Glucose, Bld: 98 mg/dL (ref 70–99)
Potassium: 3.8 mmol/L (ref 3.5–5.1)
Sodium: 139 mmol/L (ref 135–145)
Total Bilirubin: 1 mg/dL (ref 0.3–1.2)
Total Protein: 7.2 g/dL (ref 6.5–8.1)

## 2018-09-03 LAB — CBC
HCT: 43.5 % (ref 36.0–46.0)
Hemoglobin: 14.2 g/dL (ref 12.0–15.0)
MCH: 31.6 pg (ref 26.0–34.0)
MCHC: 32.6 g/dL (ref 30.0–36.0)
MCV: 96.9 fL (ref 80.0–100.0)
Platelets: 239 10*3/uL (ref 150–400)
RBC: 4.49 MIL/uL (ref 3.87–5.11)
RDW: 12 % (ref 11.5–15.5)
WBC: 4.8 10*3/uL (ref 4.0–10.5)
nRBC: 0 % (ref 0.0–0.2)

## 2018-09-03 LAB — SURGICAL PCR SCREEN
MRSA, PCR: NEGATIVE
Staphylococcus aureus: NEGATIVE

## 2018-09-03 NOTE — Progress Notes (Signed)
PCP - Lamar Blinks MD  Chest x-ray - 08/16/18 EKG - 08/14/18  Blood Thinner Instructions: N/A Aspirin Instructions: N/A  Anesthesia review: none  Patient denies shortness of breath, fever, cough and chest pain at PAT appointment   Patient verbalized understanding of instructions that were given to them at the PAT appointment. Patient was also instructed that they will need to review over the PAT instructions again at home before surgery.

## 2018-09-09 ENCOUNTER — Ambulatory Visit: Payer: Medicare Other | Admitting: Family Medicine

## 2018-09-09 MED ORDER — TRANEXAMIC ACID-NACL 1000-0.7 MG/100ML-% IV SOLN
1000.0000 mg | INTRAVENOUS | Status: AC
  Administered 2018-09-10: 1000 mg via INTRAVENOUS
  Filled 2018-09-09 (×2): qty 100

## 2018-09-10 ENCOUNTER — Inpatient Hospital Stay (HOSPITAL_COMMUNITY): Payer: Medicare Other | Admitting: Certified Registered Nurse Anesthetist

## 2018-09-10 ENCOUNTER — Other Ambulatory Visit: Payer: Self-pay

## 2018-09-10 ENCOUNTER — Encounter (HOSPITAL_COMMUNITY): Payer: Self-pay | Admitting: *Deleted

## 2018-09-10 ENCOUNTER — Inpatient Hospital Stay (HOSPITAL_COMMUNITY)
Admission: RE | Admit: 2018-09-10 | Discharge: 2018-09-11 | DRG: 483 | Disposition: A | Discharge status: Home / Self Care | Payer: Medicare Other | Attending: Orthopedic Surgery | Admitting: Orthopedic Surgery

## 2018-09-10 ENCOUNTER — Encounter (HOSPITAL_COMMUNITY)
Admission: RE | Disposition: A | Discharge status: Home / Self Care | Payer: Self-pay | Source: Home / Self Care | Attending: Orthopedic Surgery

## 2018-09-10 DIAGNOSIS — M19012 Primary osteoarthritis, left shoulder: Secondary | ICD-10-CM | POA: Diagnosis present

## 2018-09-10 DIAGNOSIS — Z96612 Presence of left artificial shoulder joint: Secondary | ICD-10-CM

## 2018-09-10 DIAGNOSIS — Z87891 Personal history of nicotine dependence: Secondary | ICD-10-CM | POA: Diagnosis not present

## 2018-09-10 DIAGNOSIS — M62830 Muscle spasm of back: Secondary | ICD-10-CM | POA: Diagnosis not present

## 2018-09-10 DIAGNOSIS — Z885 Allergy status to narcotic agent status: Secondary | ICD-10-CM | POA: Diagnosis not present

## 2018-09-10 DIAGNOSIS — Z8051 Family history of malignant neoplasm of kidney: Secondary | ICD-10-CM | POA: Diagnosis not present

## 2018-09-10 DIAGNOSIS — I10 Essential (primary) hypertension: Secondary | ICD-10-CM | POA: Diagnosis present

## 2018-09-10 DIAGNOSIS — Z8042 Family history of malignant neoplasm of prostate: Secondary | ICD-10-CM

## 2018-09-10 DIAGNOSIS — Z8249 Family history of ischemic heart disease and other diseases of the circulatory system: Secondary | ICD-10-CM | POA: Diagnosis not present

## 2018-09-10 DIAGNOSIS — G8918 Other acute postprocedural pain: Secondary | ICD-10-CM | POA: Diagnosis not present

## 2018-09-10 HISTORY — DX: Essential (primary) hypertension: I10

## 2018-09-10 HISTORY — PX: TOTAL SHOULDER ARTHROPLASTY: SHX126

## 2018-09-10 SURGERY — ARTHROPLASTY, SHOULDER, TOTAL
Anesthesia: Regional | Laterality: Left

## 2018-09-10 MED ORDER — CEFAZOLIN SODIUM-DEXTROSE 2-4 GM/100ML-% IV SOLN
INTRAVENOUS | Status: AC
  Filled 2018-09-10: qty 100

## 2018-09-10 MED ORDER — FENTANYL CITRATE (PF) 100 MCG/2ML IJ SOLN
INTRAMUSCULAR | Status: AC
  Administered 2018-09-10: 50 ug via INTRAVENOUS
  Filled 2018-09-10: qty 2

## 2018-09-10 MED ORDER — OXYCODONE HCL 5 MG PO TABS
10.0000 mg | ORAL_TABLET | ORAL | Status: DC | PRN
  Administered 2018-09-11: 10 mg via ORAL
  Filled 2018-09-10: qty 2

## 2018-09-10 MED ORDER — HYDROMORPHONE HCL 1 MG/ML IJ SOLN: 0.5000 mg | INTRAMUSCULAR | Status: DC | PRN

## 2018-09-10 MED ORDER — BUPIVACAINE LIPOSOME 1.3 % IJ SUSP
INTRAMUSCULAR | Status: DC | PRN
  Administered 2018-09-10: 133 mg via PERINEURAL

## 2018-09-10 MED ORDER — OXYCODONE HCL 5 MG PO TABS
5.0000 mg | ORAL_TABLET | ORAL | Status: DC | PRN
  Administered 2018-09-11: 5 mg via ORAL
  Filled 2018-09-10: qty 1

## 2018-09-10 MED ORDER — PHENOL 1.4 % MT LIQD: 1.0000 | OROMUCOSAL | Status: DC | PRN

## 2018-09-10 MED ORDER — ACETAMINOPHEN 160 MG/5ML PO SOLN: 1000.0000 mg | Freq: Once | ORAL | Status: DC | PRN

## 2018-09-10 MED ORDER — ONDANSETRON HCL 4 MG/2ML IJ SOLN: 4.0000 mg | Freq: Four times a day (QID) | INTRAMUSCULAR | Status: DC | PRN

## 2018-09-10 MED ORDER — OXYCODONE HCL 5 MG/5ML PO SOLN: 5.0000 mg | Freq: Once | ORAL | Status: DC | PRN

## 2018-09-10 MED ORDER — ROCURONIUM BROMIDE 50 MG/5ML IV SOSY
PREFILLED_SYRINGE | INTRAVENOUS | Status: AC
  Filled 2018-09-10: qty 5

## 2018-09-10 MED ORDER — SUGAMMADEX SODIUM 200 MG/2ML IV SOLN
INTRAVENOUS | Status: DC | PRN
  Administered 2018-09-10: 121 mg via INTRAVENOUS

## 2018-09-10 MED ORDER — MENTHOL 3 MG MT LOZG: 1.0000 | LOZENGE | OROMUCOSAL | Status: DC | PRN

## 2018-09-10 MED ORDER — METOCLOPRAMIDE HCL 5 MG/ML IJ SOLN: 5.0000 mg | Freq: Three times a day (TID) | INTRAMUSCULAR | Status: DC | PRN

## 2018-09-10 MED ORDER — MIDAZOLAM HCL 2 MG/2ML IJ SOLN
1.0000 mg | Freq: Once | INTRAMUSCULAR | Status: AC
  Administered 2018-09-10: 1 mg via INTRAVENOUS

## 2018-09-10 MED ORDER — EPHEDRINE SULFATE-NACL 50-0.9 MG/10ML-% IV SOSY
PREFILLED_SYRINGE | INTRAVENOUS | Status: DC | PRN
  Administered 2018-09-10: 5 mg via INTRAVENOUS

## 2018-09-10 MED ORDER — BUPIVACAINE HCL (PF) 0.5 % IJ SOLN
INTRAMUSCULAR | Status: DC | PRN
  Administered 2018-09-10: 15 mL via PERINEURAL

## 2018-09-10 MED ORDER — FENTANYL CITRATE (PF) 100 MCG/2ML IJ SOLN: 25.0000 ug | INTRAMUSCULAR | Status: DC | PRN

## 2018-09-10 MED ORDER — CEFAZOLIN SODIUM-DEXTROSE 2-4 GM/100ML-% IV SOLN
2.0000 g | INTRAVENOUS | Status: AC
  Administered 2018-09-10: 2 g via INTRAVENOUS

## 2018-09-10 MED ORDER — ACETAMINOPHEN 325 MG PO TABS: 325.0000 mg | ORAL_TABLET | Freq: Four times a day (QID) | ORAL | Status: DC | PRN

## 2018-09-10 MED ORDER — FLEET ENEMA 7-19 GM/118ML RE ENEM: 1.0000 | ENEMA | Freq: Once | RECTAL | Status: DC | PRN

## 2018-09-10 MED ORDER — FENTANYL CITRATE (PF) 250 MCG/5ML IJ SOLN
INTRAMUSCULAR | Status: AC
  Filled 2018-09-10: qty 5

## 2018-09-10 MED ORDER — EPHEDRINE 5 MG/ML INJ
INTRAVENOUS | Status: AC
  Filled 2018-09-10: qty 10

## 2018-09-10 MED ORDER — ROCURONIUM BROMIDE 50 MG/5ML IV SOSY
PREFILLED_SYRINGE | INTRAVENOUS | Status: DC | PRN
  Administered 2018-09-10: 10 mg via INTRAVENOUS
  Administered 2018-09-10: 40 mg via INTRAVENOUS

## 2018-09-10 MED ORDER — METHOCARBAMOL 500 MG PO TABS: 500.0000 mg | ORAL_TABLET | Freq: Four times a day (QID) | ORAL | Status: DC | PRN

## 2018-09-10 MED ORDER — ACETAMINOPHEN 500 MG PO TABS: 1000.0000 mg | ORAL_TABLET | Freq: Once | ORAL | Status: DC | PRN

## 2018-09-10 MED ORDER — DIPHENHYDRAMINE HCL 12.5 MG/5ML PO ELIX: 12.5000 mg | ORAL_SOLUTION | ORAL | Status: DC | PRN

## 2018-09-10 MED ORDER — ALUM & MAG HYDROXIDE-SIMETH 200-200-20 MG/5ML PO SUSP: 30.0000 mL | ORAL | Status: DC | PRN

## 2018-09-10 MED ORDER — KETOROLAC TROMETHAMINE 15 MG/ML IJ SOLN
7.5000 mg | Freq: Four times a day (QID) | INTRAMUSCULAR | Status: DC
  Administered 2018-09-10 – 2018-09-11 (×3): 7.5 mg via INTRAVENOUS
  Filled 2018-09-10 (×3): qty 1

## 2018-09-10 MED ORDER — METHOCARBAMOL 1000 MG/10ML IJ SOLN
500.0000 mg | Freq: Four times a day (QID) | INTRAVENOUS | Status: DC | PRN
  Filled 2018-09-10: qty 5

## 2018-09-10 MED ORDER — FENTANYL CITRATE (PF) 100 MCG/2ML IJ SOLN
INTRAMUSCULAR | Status: DC | PRN
  Administered 2018-09-10 (×3): 50 ug via INTRAVENOUS
  Administered 2018-09-10: 100 ug via INTRAVENOUS

## 2018-09-10 MED ORDER — SODIUM CHLORIDE 0.9 % IV SOLN
INTRAVENOUS | Status: DC | PRN
  Administered 2018-09-10: 50 ug/min via INTRAVENOUS

## 2018-09-10 MED ORDER — BISACODYL 5 MG PO TBEC: 5.0000 mg | DELAYED_RELEASE_TABLET | Freq: Every day | ORAL | Status: DC | PRN

## 2018-09-10 MED ORDER — ONDANSETRON HCL 4 MG/2ML IJ SOLN
INTRAMUSCULAR | Status: DC | PRN
  Administered 2018-09-10: 4 mg via INTRAVENOUS

## 2018-09-10 MED ORDER — TRAZODONE HCL 50 MG PO TABS
50.0000 mg | ORAL_TABLET | Freq: Every evening | ORAL | Status: DC | PRN
  Administered 2018-09-10: 75 mg via ORAL
  Filled 2018-09-10: qty 2

## 2018-09-10 MED ORDER — LISINOPRIL-HYDROCHLOROTHIAZIDE 10-12.5 MG PO TABS: 0.5000 | ORAL_TABLET | Freq: Every day | ORAL | Status: DC

## 2018-09-10 MED ORDER — PROPOFOL 10 MG/ML IV BOLUS
INTRAVENOUS | Status: DC | PRN
  Administered 2018-09-10: 40 mg via INTRAVENOUS
  Administered 2018-09-10: 100 mg via INTRAVENOUS

## 2018-09-10 MED ORDER — ACETAMINOPHEN 10 MG/ML IV SOLN: 1000.0000 mg | Freq: Once | INTRAVENOUS | Status: DC | PRN

## 2018-09-10 MED ORDER — ONDANSETRON HCL 4 MG/2ML IJ SOLN
INTRAMUSCULAR | Status: AC
  Filled 2018-09-10: qty 2

## 2018-09-10 MED ORDER — PHENYLEPHRINE 40 MCG/ML (10ML) SYRINGE FOR IV PUSH (FOR BLOOD PRESSURE SUPPORT)
PREFILLED_SYRINGE | INTRAVENOUS | Status: DC | PRN
  Administered 2018-09-10: 120 ug via INTRAVENOUS
  Administered 2018-09-10 (×2): 80 ug via INTRAVENOUS

## 2018-09-10 MED ORDER — CHLORHEXIDINE GLUCONATE 4 % EX LIQD: 60.0000 mL | Freq: Once | CUTANEOUS | Status: DC

## 2018-09-10 MED ORDER — HYDROCHLOROTHIAZIDE 10 MG/ML ORAL SUSPENSION
6.2500 mg | Freq: Every day | ORAL | Status: DC
  Filled 2018-09-10: qty 1.25

## 2018-09-10 MED ORDER — OXYCODONE HCL 5 MG PO TABS: 5.0000 mg | ORAL_TABLET | Freq: Once | ORAL | Status: DC | PRN

## 2018-09-10 MED ORDER — DEXAMETHASONE SODIUM PHOSPHATE 10 MG/ML IJ SOLN
INTRAMUSCULAR | Status: DC | PRN
  Administered 2018-09-10: 10 mg via INTRAVENOUS

## 2018-09-10 MED ORDER — LIDOCAINE 2% (20 MG/ML) 5 ML SYRINGE
INTRAMUSCULAR | Status: DC | PRN
  Administered 2018-09-10: 40 mg via INTRAVENOUS

## 2018-09-10 MED ORDER — METOCLOPRAMIDE HCL 5 MG PO TABS: 5.0000 mg | ORAL_TABLET | Freq: Three times a day (TID) | ORAL | Status: DC | PRN

## 2018-09-10 MED ORDER — POLYETHYLENE GLYCOL 3350 17 G PO PACK: 17.0000 g | PACK | Freq: Every day | ORAL | Status: DC | PRN

## 2018-09-10 MED ORDER — PHENYLEPHRINE 40 MCG/ML (10ML) SYRINGE FOR IV PUSH (FOR BLOOD PRESSURE SUPPORT)
PREFILLED_SYRINGE | INTRAVENOUS | Status: AC
  Filled 2018-09-10: qty 10

## 2018-09-10 MED ORDER — DOCUSATE SODIUM 100 MG PO CAPS
100.0000 mg | ORAL_CAPSULE | Freq: Two times a day (BID) | ORAL | Status: DC
  Administered 2018-09-10 – 2018-09-11 (×2): 100 mg via ORAL
  Filled 2018-09-10 (×2): qty 1

## 2018-09-10 MED ORDER — LACTATED RINGERS IV SOLN
INTRAVENOUS | Status: DC
  Administered 2018-09-10: 17:00:00 via INTRAVENOUS

## 2018-09-10 MED ORDER — LISINOPRIL 5 MG PO TABS
5.0000 mg | ORAL_TABLET | Freq: Every day | ORAL | Status: DC
  Filled 2018-09-10 (×2): qty 1

## 2018-09-10 MED ORDER — MIDAZOLAM HCL 2 MG/2ML IJ SOLN
INTRAMUSCULAR | Status: AC
  Administered 2018-09-10: 1 mg via INTRAVENOUS
  Filled 2018-09-10: qty 2

## 2018-09-10 MED ORDER — 0.9 % SODIUM CHLORIDE (POUR BTL) OPTIME
TOPICAL | Status: DC | PRN
  Administered 2018-09-10: 1000 mL

## 2018-09-10 MED ORDER — LACTATED RINGERS IV SOLN
INTRAVENOUS | Status: DC
  Administered 2018-09-10 (×2): via INTRAVENOUS

## 2018-09-10 MED ORDER — ONDANSETRON HCL 4 MG PO TABS: 4.0000 mg | ORAL_TABLET | Freq: Four times a day (QID) | ORAL | Status: DC | PRN

## 2018-09-10 MED ORDER — FENTANYL CITRATE (PF) 100 MCG/2ML IJ SOLN
50.0000 ug | Freq: Once | INTRAMUSCULAR | Status: AC
  Administered 2018-09-10: 50 ug via INTRAVENOUS

## 2018-09-10 SURGICAL SUPPLY — 69 items
ADH SKN CLS APL DERMABOND .7 (GAUZE/BANDAGES/DRESSINGS) ×1
ADH SKN CLS LQ APL DERMABOND (GAUZE/BANDAGES/DRESSINGS) ×1
AID PSTN UNV HD RSTRNT DISP (MISCELLANEOUS) ×1
BIT DRILL 5/64X5 DISP (BIT) ×2 IMPLANT
BLADE SAW SGTL 83.5X18.5 (BLADE) ×2 IMPLANT
CEMENT BONE DEPUY (Cement) ×2 IMPLANT
COVER SURGICAL LIGHT HANDLE (MISCELLANEOUS) ×2 IMPLANT
COVER WAND RF STERILE (DRAPES) ×2 IMPLANT
DERMABOND ADHESIVE PROPEN (GAUZE/BANDAGES/DRESSINGS) ×1
DERMABOND ADVANCED (GAUZE/BANDAGES/DRESSINGS) ×1
DERMABOND ADVANCED .7 DNX12 (GAUZE/BANDAGES/DRESSINGS) IMPLANT
DERMABOND ADVANCED .7 DNX6 (GAUZE/BANDAGES/DRESSINGS) ×1 IMPLANT
DRAPE ORTHO SPLIT 77X108 STRL (DRAPES) ×4
DRAPE SURG 17X11 SM STRL (DRAPES) ×2 IMPLANT
DRAPE SURG ORHT 6 SPLT 77X108 (DRAPES) ×2 IMPLANT
DRAPE U-SHAPE 47X51 STRL (DRAPES) ×2 IMPLANT
DRSG AQUACEL AG ADV 3.5X10 (GAUZE/BANDAGES/DRESSINGS) ×2 IMPLANT
DURAPREP 26ML APPLICATOR (WOUND CARE) ×2 IMPLANT
ELECT BLADE 4.0 EZ CLEAN MEGAD (MISCELLANEOUS) ×2
ELECT CAUTERY BLADE 6.4 (BLADE) ×2 IMPLANT
ELECT REM PT RETURN 9FT ADLT (ELECTROSURGICAL) ×2
ELECTRODE BLDE 4.0 EZ CLN MEGD (MISCELLANEOUS) ×1 IMPLANT
ELECTRODE REM PT RTRN 9FT ADLT (ELECTROSURGICAL) ×1 IMPLANT
FACESHIELD WRAPAROUND (MASK) ×6 IMPLANT
FACESHIELD WRAPAROUND OR TEAM (MASK) ×3 IMPLANT
GLENOID WITH CLEAT SM (Miscellaneous) ×1 IMPLANT
GLOVE BIO SURGEON STRL SZ7.5 (GLOVE) ×2 IMPLANT
GLOVE BIO SURGEON STRL SZ8 (GLOVE) ×2 IMPLANT
GLOVE EUDERMIC 7 POWDERFREE (GLOVE) ×2 IMPLANT
GLOVE SS BIOGEL STRL SZ 7.5 (GLOVE) ×1 IMPLANT
GLOVE SUPERSENSE BIOGEL SZ 7.5 (GLOVE) ×1
GOWN STRL REUS W/ TWL LRG LVL3 (GOWN DISPOSABLE) ×1 IMPLANT
GOWN STRL REUS W/ TWL XL LVL3 (GOWN DISPOSABLE) ×2 IMPLANT
GOWN STRL REUS W/TWL LRG LVL3 (GOWN DISPOSABLE) ×2
GOWN STRL REUS W/TWL XL LVL3 (GOWN DISPOSABLE) ×4
HEAD HUMERAL UNIVERSE 42X17 (Head) ×1 IMPLANT
KIT BASIN OR (CUSTOM PROCEDURE TRAY) ×2 IMPLANT
KIT SET UNIVERSAL (KITS) ×1 IMPLANT
KIT TURNOVER KIT B (KITS) ×2 IMPLANT
MANIFOLD NEPTUNE II (INSTRUMENTS) ×2 IMPLANT
NDL TAPERED W/ NITINOL LOOP (MISCELLANEOUS) ×1 IMPLANT
NEEDLE TAPERED W/ NITINOL LOOP (MISCELLANEOUS) ×2 IMPLANT
NS IRRIG 1000ML POUR BTL (IV SOLUTION) ×2 IMPLANT
PACK SHOULDER (CUSTOM PROCEDURE TRAY) ×2 IMPLANT
PAD ARMBOARD 7.5X6 YLW CONV (MISCELLANEOUS) ×4 IMPLANT
RESTRAINT HEAD UNIVERSAL NS (MISCELLANEOUS) ×2 IMPLANT
SLING ARM FOAM STRAP LRG (SOFTGOODS) IMPLANT
SLING ARM FOAM STRAP MED (SOFTGOODS) ×1 IMPLANT
SLING ARM IMMOBILIZER LRG (SOFTGOODS) IMPLANT
SLING ARM IMMOBILIZER MED (SOFTGOODS) IMPLANT
SLING ARM XL FOAM STRAP (SOFTGOODS) ×2 IMPLANT
SMARTMIX MINI TOWER (MISCELLANEOUS) ×2
SPONGE LAP 18X18 X RAY DECT (DISPOSABLE) ×2 IMPLANT
SPONGE LAP 4X18 RFD (DISPOSABLE) ×2 IMPLANT
STEM HUMERAL APEX UNI 9MM (Stem) ×1 IMPLANT
SUCTION FRAZIER HANDLE 10FR (MISCELLANEOUS) ×1
SUCTION TUBE FRAZIER 10FR DISP (MISCELLANEOUS) ×1 IMPLANT
SUT FIBERWIRE #2 38 T-5 BLUE (SUTURE) ×2
SUT MNCRL AB 3-0 PS2 18 (SUTURE) ×2 IMPLANT
SUT MON AB 2-0 CT1 36 (SUTURE) ×2 IMPLANT
SUT VIC AB 1 CT1 27 (SUTURE) ×10
SUT VIC AB 1 CT1 27XBRD ANBCTR (SUTURE) ×3 IMPLANT
SUTURE FIBERWR #2 38 T-5 BLUE (SUTURE) ×1 IMPLANT
SUTURE TAPE 1.3 40 TPR END (SUTURE) ×3 IMPLANT
SUTURETAPE 1.3 40 TPR END (SUTURE) ×6
SYR CONTROL 10ML LL (SYRINGE) IMPLANT
TOWEL OR 17X26 10 PK STRL BLUE (TOWEL DISPOSABLE) ×2 IMPLANT
TOWER SMARTMIX MINI (MISCELLANEOUS) ×1 IMPLANT
WATER STERILE IRR 1000ML POUR (IV SOLUTION) ×2 IMPLANT

## 2018-09-10 NOTE — Transfer of Care (Signed)
Immediate Anesthesia Transfer of Care Note  Patient: Dawn Powers  Procedure(s) Performed: TOTAL SHOULDER ARTHROPLASTY (Left )  Patient Location: PACU  Anesthesia Type:General  Level of Consciousness: awake, alert  and oriented  Airway & Oxygen Therapy: Patient Spontanous Breathing and Patient connected to nasal cannula oxygen  Post-op Assessment: Report given to RN and Post -op Vital signs reviewed and stable  Post vital signs: Reviewed and stable  Last Vitals:  Vitals Value Taken Time  BP 154/72 09/10/2018  4:12 PM  Temp 36.3 C 09/10/2018  4:12 PM  Pulse 94 09/10/2018  4:14 PM  Resp 11 09/10/2018  4:14 PM  SpO2 100 % 09/10/2018  4:14 PM  Vitals shown include unvalidated device data.  Last Pain:  Vitals:   09/10/18 1612  TempSrc:   PainSc: (P) 0-No pain      Patients Stated Pain Goal: 1 (31/49/70 2637)  Complications: No apparent anesthesia complications

## 2018-09-10 NOTE — Anesthesia Procedure Notes (Signed)
Procedure Name: Intubation Date/Time: 09/10/2018 2:17 PM Performed by: Candis Shine, CRNA Pre-anesthesia Checklist: Patient identified, Emergency Drugs available, Suction available and Patient being monitored Patient Re-evaluated:Patient Re-evaluated prior to induction Oxygen Delivery Method: Circle System Utilized Preoxygenation: Pre-oxygenation with 100% oxygen Induction Type: IV induction Ventilation: Mask ventilation without difficulty Laryngoscope Size: Mac and 3 Grade View: Grade I Tube type: Oral Tube size: 7.0 mm Number of attempts: 1 Airway Equipment and Method: Stylet Placement Confirmation: ETT inserted through vocal cords under direct vision,  positive ETCO2 and breath sounds checked- equal and bilateral Secured at: 21 cm Tube secured with: Tape Dental Injury: Teeth and Oropharynx as per pre-operative assessment

## 2018-09-10 NOTE — Plan of Care (Signed)

## 2018-09-10 NOTE — Anesthesia Preprocedure Evaluation (Signed)
Anesthesia Evaluation  Patient identified by MRN, date of birth, ID band Patient awake    Reviewed: Allergy & Precautions, NPO status , Patient's Chart, lab work & pertinent test results  History of Anesthesia Complications Negative for: history of anesthetic complications  Airway Mallampati: I  TM Distance: >3 FB Neck ROM: Full    Dental  (+) Teeth Intact   Pulmonary neg shortness of breath, neg sleep apnea, neg COPD, neg recent URI, former smoker,    breath sounds clear to auscultation       Cardiovascular hypertension, Pt. on medications  Rhythm:Regular     Neuro/Psych neg Seizures  Neuromuscular disease negative psych ROS   GI/Hepatic negative GI ROS, Neg liver ROS,   Endo/Other  negative endocrine ROS  Renal/GU negative Renal ROS     Musculoskeletal  (+) Arthritis ,   Abdominal   Peds  Hematology negative hematology ROS (+)   Anesthesia Other Findings   Reproductive/Obstetrics                             Anesthesia Physical Anesthesia Plan  ASA: II  Anesthesia Plan: General and Regional   Post-op Pain Management:  Regional for Post-op pain   Induction: Intravenous  PONV Risk Score and Plan: 3 and Ondansetron and Dexamethasone  Airway Management Planned: Oral ETT  Additional Equipment: None  Intra-op Plan:   Post-operative Plan: Extubation in OR  Informed Consent: I have reviewed the patients History and Physical, chart, labs and discussed the procedure including the risks, benefits and alternatives for the proposed anesthesia with the patient or authorized representative who has indicated his/her understanding and acceptance.   Dental advisory given  Plan Discussed with: CRNA and Surgeon  Anesthesia Plan Comments:         Anesthesia Quick Evaluation

## 2018-09-10 NOTE — H&P (Signed)
Dawn Powers    Chief Complaint: left shoulder osteoarthritis HPI: The patient is a 72 y.o. female with end stage left shoulder OA  Past Medical History:  Diagnosis Date  . Arthritis   . Cancer [facility])    Endometrial   . Heart murmur   . Hyperlipidemia   . Hypertension   . Neuromuscular disorder ([place])    DDD  . Pain    lower back -hx "DDD"  . Radiation 11/15/15, 11/23/15, 11/30/15, 12/07/15, 12/14/15   HDR brachytherapy    Past Surgical History:  Procedure Laterality Date  . ABDOMINAL HYSTERECTOMY    . COLONOSCOPY    . FRACTURE SURGERY Right    ankle  . GANGLION CYST EXCISION    . LYMPH NODE BIOPSY N/A 09/26/2015   Procedure: SENTINEL LYMPH NODE BIOPSY;  Surgeon: Everitt Amber, MD;  Location: [facility];  Service: Gynecology;  Laterality: N/A;  . ROBOTIC ASSISTED TOTAL HYSTERECTOMY WITH BILATERAL SALPINGO OOPHERECTOMY Bilateral 09/26/2015   Procedure: ROBOTIC ASSISTED TOTAL HYSTERECTOMY WITH BILATERAL SALPINGO OOPHORECTOMY;  Surgeon: Everitt Amber, MD;  Location: [facility];  Service: Gynecology;  Laterality: Bilateral;  . WISDOM TOOTH EXTRACTION      Family History  Problem Relation Age of Onset  . Heart disease Mother   . Alcohol abuse Mother        breast cancer - questionable   . Dementia Mother   . Alcohol abuse Father   . Prostate cancer Father 40       prostate and colon- unsure of primary   . Kidney cancer Brother   . Alcohol abuse Maternal Grandfather   . Alcohol abuse Sister   . Esophageal cancer Neg Hx   . Stomach cancer Neg Hx   . Rectal cancer Neg Hx     Social History:  reports that she quit smoking about 19 years ago. Her smoking use included cigarettes. She quit after 4.00 years of use. She has never used smokeless tobacco. She reports current alcohol use of about 3.0 standard drinks of alcohol per week. She reports that she does not use drugs.   Medications Prior to Admission  Medication Sig Dispense Refill  . HYDROcodone-acetaminophen (NORCO/VICODIN) 5-325 MG  tablet Take 2 tablets by mouth every 6 (six) hours as needed for moderate pain.    Marland Kitchen lisinopril-hydrochlorothiazide (PRINZIDE,ZESTORETIC) 10-12.5 MG tablet Take 1 tablet by mouth daily. (Patient taking differently: Take 0.5 tablets by mouth daily. ) 30 tablet 3  . magnesium oxide (MAG-OX) 400 MG tablet Take 400 mg by mouth as needed.    Marland Kitchen Propylene Glycol (SYSTANE BALANCE OP) Place 1 drop into both eyes daily as needed (dry eyes).     . traZODone (DESYREL) 50 MG tablet TAKE 1-1&1/2 TABLETS AT BEDTIME AS NEEDED FOR SLEEP. (Patient taking differently: Take 50-75 mg by mouth at bedtime as needed for sleep. TAKE 1-1&1/2 TABLETS AT BEDTIME AS NEEDED FOR SLEEP.) 45 tablet 2     Physical Exam: left shoulder with painful  And restricted motion as noted at recent office visits  Vitals  Temp:  [98.8 F (37.1 C)] 98.8 F (37.1 C) (12/12 1143) Pulse Rate:  [84] 84 (12/12 1143) Resp:  [18] 18 (12/12 1143) BP: (183)/(74) 183/74 (12/12 1143) SpO2:  [97 %] 97 % (12/12 1143) Weight:  [60.5 kg] 60.5 kg (12/12 1143)  Assessment/Plan  Impression: left shoulder osteoarthritis  Plan of Action: Procedure(s): TOTAL SHOULDER ARTHROPLASTY  [name] M Rashada Klontz 09/10/2018, 1:31 PM Contact # [phone]

## 2018-09-10 NOTE — Plan of Care (Signed)
  Problem: Activity: Goal: Risk for activity intolerance will decrease Outcome: Progressing   Problem: Nutrition: Goal: Adequate nutrition will be maintained Outcome: Progressing   Problem: Coping: Goal: Level of anxiety will decrease Outcome: Progressing   Problem: Elimination: Goal: Will not experience complications related to urinary retention Outcome: Progressing   

## 2018-09-10 NOTE — Anesthesia Procedure Notes (Signed)
Anesthesia Regional Block: Interscalene brachial plexus block   Pre-Anesthetic Checklist: ,, timeout performed, Correct Patient, Correct Site, Correct Laterality, Correct Procedure, Correct Position, site marked, Risks and benefits discussed,  Surgical consent,  Pre-op evaluation,  At surgeon's request and post-op pain management  Laterality: Left  Prep: chloraprep       Needles:  Injection technique: Single-shot     Needle Length: 5cm  Needle Gauge: 22     Additional Needles: Arrow StimuQuik ECHO Echogenic Stimulating PNB Needle  Procedures:,,,, ultrasound used (permanent image in chart),,,,  Narrative:  Start time: 09/10/2018 1:28 PM End time: 09/10/2018 1:32 PM Injection made incrementally with aspirations every 5 mL.  Performed by: Personally  Anesthesiologist: Oleta Mouse, MD

## 2018-09-10 NOTE — Op Note (Signed)
09/10/2018  3:48 PM  PATIENT:   [name]  72 y.o. female  PRE-OPERATIVE DIAGNOSIS:  left shoulder osteoarthritis  POST-OPERATIVE DIAGNOSIS: Same  PROCEDURE: Left total shoulder arthroplasty utilizing a size 9 press-fit Arthrex stem with a 42 x 17 eccentric humeral head and a small glenoid  SURGEON:  Trevan Messman, Metta Clines M.D.  ASSISTANTS: Jenetta Loges, PA-C  ANESTHESIA:   General endotracheal as well as interscalene block with Exparel  EBL: 100 cc  SPECIMEN: None  Drains: None   PATIENT DISPOSITION:  PACU - hemodynamically stable.    PLAN OF CARE: Admit for overnight observation  Brief history:  Ms. Gadbois has been followed for chronic and progressively increasing left shoulder pain related to end stage osteoarthritis.  Due to her increasing pain and functional mentation she is brought to the operating this time for planned left total shoulder arthroplasty  Preoperatively and counseled her regarding treatment options as well as the potential risks versus benefits thereof.  Possible surgical complications were reviewed including the potential for bleeding, infection, neurovascular injury, persistence of pain, loss of motion, anesthetic complication, failure of the implant, and possible need for additional surgery.  She understands and accepts and agrees with her planned procedure.  Procedure detail:  After undergoing routine preop evaluation patient received prophylactic antibiotics and a interscalene block with Exparel was established in the holding area by the anesthesia department.  Placed supine on the operating table and underwent the smooth induction of a general endotracheal anesthesia.  Placed into the beachchair position and appropriately padded and protected.  The left shoulder girdle region was sterilely prepped and draped in standard fashion.  Timeout was called.  An anterior deltopectoral approach left shoulder was made through an 8 cm incision.  Skin flaps  elevated dissection carried deeply cephalic vein taken laterally deltopectoral interval developed from proximal to distal with the upper centimeter the pectoralis major tenotomized for exposure conjoined tendon retracted medially.  The long head biceps tendon was then unroofed and tenodesed that he had report of the pectoralis major tendon and then divided and the proximal segment was unroofed and excised.  We then identified the insertion of the subscapularis into the lesser tuberosity and utilized an oscillating saw to perform a lesser tuberosity osteotomy and the subscapularis that is mobilized and reflected medially after be intact.  Capsular attachments from the anterior and inferior margins of the humeral neck were then divided and the humeral head was delivered through the wound.  We outlined the proposed humeral head resection with the extra medullary guide and oscillating saw was then used to resect the head the native retroversion of approximate 30 degrees and care taken to protect the surrounding soft tissues.  The remaining osteophytes in the margin of the humeral neck were removed with a rondure.  The humeral canal was then prepared reaming and broaching up to size 9 and size 8 trial with metal cap placed into the humeral canal we then exposed the glenoid with a combination of Fukuda, pitchfork, and stick, retractors.  I performed a circumferential labral resection gaining complete visualization the periphery of the glenoid and a guidepin was then directed into the center of the glenoid and was reamed to a subchondral bony bed and then terminally prepared with a central drill hole followed by the superior and inferior peg and slot respectively cleared of debris broached and a trial showed excellent fit.  At this point cement was mixed introduced into the superior and inferior peg and slot and  then morselized bone graft applied around her central peg and the glenoid was then impacted with excellent fit  and fixation.  We then returned our attention back to the proximal humerus where drill holes were passed through the humeral neck one medial and to lateral to the LTO.  Shuttle sutures were then passed.  We then inserted the humeral stem provisionally and passed the suture limbs that would be utilized for the repair of our LTO.  Once this was accomplished additional bone graft was inserted into the humeral metaphyseal region and our implant was then terminally seated and the proximal locking screws were then tightened.  We then performed some trial reductions in the 42 x 17 head gave [place] excellent soft tissue balance with 50% translation of the humeral head of the glenoid.  Trial was removed the final head was impacted after the [facility] taper was cleaned and dried.  Final reduction was then performed and the overall soft tissue balance and stability was much to her satisfaction.  This point the LTO was then repaired back to the proximal humerus using the combination of sutures that had been shuttled through the collar of the implant and through the drill holes on the proximal humerus and the suture limbs were then passed medially through the bone tendon junction of the LTO and then tied with our standard series of 4 sutures to horizontal and 2 crossing all of which allowed excellent compression of the LTO bone fragment against the bed on the humeral metaphysis.  We then repaired the rotator interval and the superior rotator cuff with a pair of figure-of-eight suture tapes.  All suture limbs were then clipped.  Completion the arm easily achieved 40 degrees of external rotation without excessive tension on the LTL repair.  Wound was then copiously irrigated.  Hemostasis was obtained.  The deltopectoral interval was closed with a series of figure-of-eight #1 Vicryl sutures.  2-0 Vicryl used for subcu layer and intracuticular 3-0 Monocryl for the skin followed by Dermabond and Aquasol dressing left arm was then placed into  a sling and the patient was awakened extubated taken to recovery in stable condition.  Jenetta Loges, PA-C was used as an Environmental consultant throughout this case essential for help with positioning of the patient, position extremity, tissue manipulation, implantation of the prosthesis, wound closure, and intraoperative decision-making.  Metta Clines Amarie Viles MD   Contact # [phone]

## 2018-09-11 ENCOUNTER — Encounter (HOSPITAL_COMMUNITY): Payer: Self-pay | Admitting: Orthopedic Surgery

## 2018-09-11 MED ORDER — ONDANSETRON HCL 4 MG PO TABS: 4.0000 mg | ORAL_TABLET | Freq: Four times a day (QID) | ORAL | 0 refills | Status: DC | PRN

## 2018-09-11 MED ORDER — CYCLOBENZAPRINE HCL 10 MG PO TABS: 10.0000 mg | ORAL_TABLET | Freq: Three times a day (TID) | ORAL | 1 refills | Status: DC | PRN

## 2018-09-11 MED ORDER — OXYCODONE-ACETAMINOPHEN 5-325 MG PO TABS: 1.0000 | ORAL_TABLET | ORAL | 0 refills | Status: DC | PRN

## 2018-09-11 NOTE — Anesthesia Postprocedure Evaluation (Signed)
Anesthesia Post Note  Patient: Dawn Powers  Procedure(s) Performed: TOTAL SHOULDER ARTHROPLASTY (Left )     Patient location during evaluation: PACU Anesthesia Type: Regional and General Level of consciousness: awake and alert Pain management: pain level controlled Vital Signs Assessment: post-procedure vital signs reviewed and stable Respiratory status: spontaneous breathing, nonlabored ventilation, respiratory function stable and patient connected to nasal cannula oxygen Cardiovascular status: blood pressure returned to baseline and stable Postop Assessment: no apparent nausea or vomiting Anesthetic complications: no    Last Vitals:  Vitals:   09/11/18 0404 09/11/18 0942  BP: (!) 126/59 (!) 106/54  Pulse: 65 64  Resp: 16 18  Temp: 36.6 C 36.8 C  SpO2: 98% 96%    Last Pain:  Vitals:   09/11/18 1244  TempSrc:   PainSc: 5                  Jerlene Rockers

## 2018-09-11 NOTE — Discharge Summary (Signed)
PATIENT ID:      Dawn Powers  MRN:     [id number] DOB/AGE:    11/11/1945 / 72 y.o.     DISCHARGE SUMMARY  ADMISSION DATE:    09/10/2018 DISCHARGE DATE:    ADMISSION DIAGNOSIS: left shoulder osteoarthritis Past Medical History:  Diagnosis Date  . Arthritis   . Cancer [facility])    Endometrial   . Heart murmur   . Hyperlipidemia   . Hypertension   . Neuromuscular disorder ([place])    DDD  . Pain    lower back -hx "DDD"  . Radiation 11/15/15, 11/23/15, 11/30/15, 12/07/15, 12/14/15   HDR brachytherapy    DISCHARGE DIAGNOSIS:   Active Problems:   S/P shoulder replacement, left   PROCEDURE: Procedure(s): TOTAL SHOULDER ARTHROPLASTY on 09/10/2018  CONSULTS:    HISTORY:  See H&P in chart.  HOSPITAL COURSE:  Dawn Powers is a 72 y.o. admitted on 09/10/2018 with a diagnosis of left shoulder osteoarthritis.  They were brought to the operating room on 09/10/2018 and underwent Procedure(s): TOTAL SHOULDER ARTHROPLASTY.    They were given perioperative antibiotics:  Anti-infectives (From admission, onward)   Start     Dose/Rate Route Frequency Ordered Stop   09/11/18 0600  ceFAZolin (ANCEF) IVPB 2g/100 mL premix     2 g 200 mL/hr over 30 Minutes Intravenous On call to O.R. 09/10/18 1204 09/10/18 1420   09/10/18 1206  ceFAZolin (ANCEF) 2-4 GM/100ML-% IVPB    Note to Pharmacy:  Ardine Eng   : cabinet override      09/10/18 1206 09/10/18 1420    .  Patient underwent the above named procedure and tolerated it well. The following day they were hemodynamically stable and pain was controlled on oral analgesics. Her only pain complaints were of back spasms.They were neurovascularly intact to the operative extremity. OT was ordered and worked with patient per protocol. They were medically and orthopaedically stable for discharge on day 1 .     DIAGNOSTIC STUDIES:  RECENT RADIOGRAPHIC STUDIES :  Dg Chest 2 View  Result Date: 08/16/2018 CLINICAL DATA:  Shortness of breath, dizziness  EXAM: CHEST - 2 VIEW COMPARISON:  12/21/2014 FINDINGS: There is hyperinflation of the lungs compatible with COPD. Heart and mediastinal contours are within normal limits. No focal opacities or effusions. No acute bony abnormality. Thoracolumbar scoliosis. IMPRESSION: COPD.  No active disease. Electronically Signed   By: Rolm Baptise M.D.   On: 08/16/2018 20:22    RECENT VITAL SIGNS:   Patient Vitals for the past 24 hrs:  BP Temp Temp src Pulse Resp SpO2 Height Weight  09/11/18 0942 (!) 106/54 98.2 F (36.8 C) Oral 64 18 96 % - -  09/11/18 0404 (!) 126/59 97.8 F (36.6 C) Oral 65 16 98 % - -  09/11/18 0004 114/60 98 F (36.7 C) Oral 68 20 96 % - -  09/10/18 2027 135/61 98 F (36.7 C) Oral 75 16 95 % - -  09/10/18 1659 (!) 172/76 97.7 F (36.5 C) Oral 85 - 96 % - -  09/10/18 1637 (!) 158/74 (!) 97.3 F (36.3 C) - 88 18 100 % - -  09/10/18 1626 (!) 168/79 - - 94 (!) 26 97 % - -  09/10/18 1612 (!) 154/72 (!) 97.3 F (36.3 C) - 97 (!) 7 99 % - -  09/10/18 1340 (!) 160/67 - - 77 16 100 % - -  09/10/18 1335 (!) 188/77 - - 83 12 100 % - -  09/10/18 1330 (!) 199/70 - - 87 14 100 % - -  09/10/18 1143 (!) 183/74 98.8 F (37.1 C) Oral 84 18 97 % 5' 3.5" (1.613 m) 60.5 kg  .  RECENT EKG RESULTS:    Orders placed or performed in visit on 08/14/18  . EKG 12-Lead    DISCHARGE INSTRUCTIONS:    DISCHARGE MEDICATIONS:   Allergies as of 09/11/2018      Reactions   Morphine And Related Hives      Medication List    STOP taking these medications   HYDROcodone-acetaminophen 5-325 MG tablet Commonly known as:  NORCO/VICODIN     TAKE these medications   cyclobenzaprine 10 MG tablet Commonly known as:  FLEXERIL Take 1 tablet (10 mg total) by mouth 3 (three) times daily as needed for muscle spasms.   lisinopril-hydrochlorothiazide 10-12.5 MG tablet Commonly known as:  PRINZIDE,ZESTORETIC Take 1 tablet by mouth daily. What changed:  how much to take   magnesium oxide 400 MG  tablet Commonly known as:  MAG-OX Take 400 mg by mouth as needed.   ondansetron 4 MG tablet Commonly known as:  ZOFRAN Take 1 tablet (4 mg total) by mouth every 6 (six) hours as needed for nausea.   oxyCODONE-acetaminophen 5-325 MG tablet Commonly known as:  PERCOCET Take 1 tablet by mouth every 4 (four) hours as needed (max 6 q).   SYSTANE BALANCE OP Place 1 drop into both eyes daily as needed (dry eyes).   traZODone 50 MG tablet Commonly known as:  DESYREL TAKE 1-1&1/2 TABLETS AT BEDTIME AS NEEDED FOR SLEEP. What changed:    how much to take  how to take this  when to take this  reasons to take this       FOLLOW UP VISIT:   Follow-up Information    Justice Britain, MD.   Specialty:  Orthopedic Surgery Why:  call to be seen in 10-14 days Contact information: [address] [place] [address] [phone]           DISCHARGE TO: Home   DISCHARGE CONDITION:  [place] for Dr. Justice Britain 09/11/2018, 10:17 AM

## 2018-09-11 NOTE — Discharge Instructions (Signed)
Metta Clines. Supple, M.D., F.A.A.O.S. Orthopaedic Surgery Specializing in Arthroscopic and Reconstructive Surgery of the Shoulder and Knee [phone] [address] [place], [place] [address] - Fax [phone]   POST-OP TOTAL SHOULDER REPLACEMENT INSTRUCTIONS  1. Call the office at [phone] to schedule your first post-op appointment 10-14 days from the date of your surgery.  2. The bandage over your incision is waterproof. You may begin showering with this dressing on. You may leave this dressing on until first follow up appointment within 2 weeks. We prefer you leave this dressing in place until follow up however after 5-7 days if you are having itching or skin irritation and would like to remove it you may do so. Go slow and tug at the borders gently to break the bond the dressing has with the skin. At this point if there is no drainage it is okay to go without a bandage or you may cover it with a light guaze and tape. You can also expect significant bruising around your shoulder that will drift down your arm and into your chest wall. This is very normal and should resolve over several days.   3. Wear your sling/immobilizer at all times except to perform the exercises below or to occasionally let your arm dangle by your side to stretch your elbow. You also need to sleep in your sling immobilizer until instructed otherwise.  4. Range of motion to your elbow, wrist, and hand are encouraged 3-5 times daily. Exercise to your hand and fingers helps to reduce swelling you may experience.  5. Utilize ice to the shoulder 3-5 times minimum a day and additionally if you are experiencing pain.  6. Prescriptions for a pain medication and a muscle relaxant are provided for you. It is recommended that if you are experiencing pain that you pain medication alone is not controlling, add the muscle relaxant along with the pain medication which can give additional pain relief. The first 1-2 days  is generally the most severe of your pain and then should gradually decrease. As your pain lessens it is recommended that you decrease your use of the pain medications to an "as needed basis'" only and to always comply with the recommended dosages of the pain medications.  7. Pain medications can produce constipation along with their use. If you experience this, the use of an over the counter stool softener or laxative daily is recommended.   8. For additional questions or concerns, please do not hesitate to call the office. If after hours there is an answering service to forward your concerns to the physician on call.  9.Pain control following an exparel block  To help control your post-operative pain you received a nerve block  performed with Exparel which is a long acting anesthetic (numbing agent) which can provide pain relief and sensations of numbness (and relief of pain) in the operative shoulder and arm for up to 3 days. Sometimes it provides mixed relief, meaning you may still have numbness in certain areas of the arm but can still be able to move  parts of that arm, hand, and fingers. We recommend that your prescribed pain medications  be used as needed. We do not feel it is necessary to "pre medicate" and "stay ahead" of pain.  Taking narcotic pain medications when you are not having any pain can lead to unnecessary and potentially dangerous side effects.   10. Take 2 OTC aleve (naprosyn) twice daily for 2 weeks then as needed  POST-OP EXERCISES  Pendulum Exercises  Perform pendulum exercises while standing and bending at the waist. Support your uninvolved arm on a table or chair and allow your operated arm to hang freely. Make sure to do these exercises passively - not using you shoulder muscles.  Repeat 20 times. Do 3 sessions per day.

## 2018-09-11 NOTE — Progress Notes (Signed)
Nsg Discharge Note  Admit Date:  09/10/2018 Discharge date: 09/11/2018   [name] to be D/C'd Home per MD order.  AVS completed.  Copy for chart, and copy for patient signed, and dated. Patient/caregiver able to verbalize understanding.  Discharge Medication: Allergies as of 09/11/2018      Reactions   Morphine And Related Hives      Medication List    STOP taking these medications   HYDROcodone-acetaminophen 5-325 MG tablet Commonly known as:  NORCO/VICODIN     TAKE these medications   cyclobenzaprine 10 MG tablet Commonly known as:  FLEXERIL Take 1 tablet (10 mg total) by mouth 3 (three) times daily as needed for muscle spasms.   lisinopril-hydrochlorothiazide 10-12.5 MG tablet Commonly known as:  PRINZIDE,ZESTORETIC Take 1 tablet by mouth daily. What changed:  how much to take   magnesium oxide 400 MG tablet Commonly known as:  MAG-OX Take 400 mg by mouth as needed.   ondansetron 4 MG tablet Commonly known as:  ZOFRAN Take 1 tablet (4 mg total) by mouth every 6 (six) hours as needed for nausea.   oxyCODONE-acetaminophen 5-325 MG tablet Commonly known as:  PERCOCET Take 1 tablet by mouth every 4 (four) hours as needed (max 6 q).   SYSTANE BALANCE OP Place 1 drop into both eyes daily as needed (dry eyes).   traZODone 50 MG tablet Commonly known as:  DESYREL TAKE 1-1&1/2 TABLETS AT BEDTIME AS NEEDED FOR SLEEP. What changed:    how much to take  how to take this  when to take this  reasons to take this       Discharge Assessment: Vitals:   09/11/18 0404 09/11/18 0942  BP: (!) 126/59 (!) 106/54  Pulse: 65 64  Resp: 16 18  Temp: 97.8 F (36.6 C) 98.2 F (36.8 C)  SpO2: 98% 96%   Skin clean, dry and intact without evidence of skin break down, no evidence of skin tears noted. IV catheter discontinued intact. Site without signs and symptoms of complications - no redness or edema noted at insertion site, patient denies c/o pain - only slight  tenderness at site.  Dressing with slight pressure applied.  D/c Instructions-Education: Discharge instructions given to patient/family with verbalized understanding. D/c education completed with patient/family including follow up instructions, medication list, d/c activities limitations if indicated, with other d/c instructions as indicated by MD - patient able to verbalize understanding, all questions fully answered. Patient instructed to return to ED, call 911, or call MD for any changes in condition.  Patient escorted via [place], and D/C home via private auto.  Eda Keys, RN 09/11/2018 11:12 AM

## 2018-09-11 NOTE — Evaluation (Signed)
Occupational Therapy Evaluation Patient Details Name: Dawn Powers MRN: [id number] DOB: March 24, 1946 Today's Date: 09/11/2018    History of Present Illness 72 yo female s/p L TSA PMH: Arthritis, CA, HLD, HTN, lower back DDD, ankle fx   Clinical Impression   Pt is a 72 yo female s/p L TSA with above PMH. Pt educate don LUE elbow, wrist and hand exercises, pendellum, and hand slide in lap exercises with moderate cueing for proper technique. Pt performing sling management for donning/doffing and wear schedule. Pt limited by nerve block still in effect throughout session. Pt performing own toilet hygiene with MinA for stability in standing; grooming at sink with supervision and minA/guard for ADL functional transfers and ADL functional mobility with no AD. Dressing techniques simulated at EOB and in standing with Mod A overall. Pt with back pain from PLOF so heating pad applied once pt returned to bed due to increased pain in low back and ice applied to L shoulder. Pt given handouts and pt verbally agreed to plan of care. Pt would benefit from continued OT skilled services for [facility] for pain management, progression of HEP and safety awareness. Thank you for this referral.    Follow Up Recommendations  Home health OT;Follow surgeon's recommendation for DC plan and follow-up therapies    Equipment Recommendations  None recommended by OT    Recommendations for Other Services       Precautions / Restrictions Precautions Precautions: Shoulder Type of Shoulder Precautions: conservative Shoulder Interventions: Shoulder sling/immobilizer;Off for dressing/bathing/exercises Precaution Comments: sling x4 weeks/ shoulder protocol, pendulums and lap slides handout provided Required Braces or Orthoses: Sling Restrictions Weight Bearing Restrictions: Yes LUE Weight Bearing: Non weight bearing      Mobility Bed Mobility Overal bed mobility: Modified Independent                 Transfers Overall transfer level: Needs assistance Equipment used: 1 person hand held assist Transfers: Sit to/from Stand Sit to Stand: Min guard              Balance                                           ADL either performed or assessed with clinical judgement   ADL Overall ADL's : Needs assistance/impaired Eating/Feeding: Minimal assistance   Grooming: Minimal assistance;Adhering to UE precautions;Cueing for compensatory techniques;Sitting;Standing   Upper Body Bathing: Moderate assistance;Adhering to UE precautions;Cueing for compensatory techniques;Cueing for UE precautions   Lower Body Bathing: Moderate assistance;Cueing for compensatory techniques;Sit to/from stand   Upper Body Dressing : Moderate assistance;Adhering to UE precautions;Cueing for sequencing;Cueing for compensatory techniques;Cueing for UE precautions   Lower Body Dressing: Moderate assistance;Cueing for compensatory techniques;Cueing for safety;Sit to/from stand   Toilet Transfer: Min guard;Ambulation;Grab bars   Toileting- Clothing Manipulation and Hygiene: Minimal assistance;Cueing for compensatory techniques;Sit to/from stand       Functional mobility during ADLs: Min guard General ADL Comments: ADLs limited by recent L shoulder replacement. Pt requiring assist for safety and pt's nerve block still has not worn off. Simulating light grooming at sink, dressing with L shoulder brace on and toilet hygiene.      Vision Baseline Vision/History: Wears glasses Wears Glasses: At all times Vision Assessment?: No apparent visual deficits     Perception     Praxis      Pertinent Vitals/Pain  Hand Dominance Right   Extremity/Trunk Assessment Upper Extremity Assessment Upper Extremity Assessment: Overall WFL for tasks assessed(Limited in L elbow, wrist and hand due to recent nerve block)       Cervical / Trunk Assessment Cervical / Trunk Assessment: Other  exceptions Cervical / Trunk Exceptions: hx of back pain   Communication Communication Communication: No difficulties   Cognition Arousal/Alertness: Awake/alert Behavior During Therapy: Anxious Overall Cognitive Status: Within Functional Limits for tasks assessed                                     General Comments  Dressing applied to surgery site no blood detected.    Exercises Exercises: Shoulder;Hand exercises Shoulder Exercises Pendulum Exercise: PROM;10 reps;Standing Elbow Flexion: 10 reps;PROM;Seated Elbow Extension: PROM;10 reps Wrist Flexion: AROM;10 reps;Seated Wrist Extension: AAROM;10 reps;Seated   Shoulder Instructions Shoulder Instructions Donning/doffing shirt without moving shoulder: Moderate assistance Method for sponge bathing under operated UE: Moderate assistance Donning/doffing sling/immobilizer: Moderate assistance Correct positioning of sling/immobilizer: Moderate assistance Pendulum exercises (written home exercise program): Moderate assistance ROM for elbow, wrist and digits of operated UE: Moderate assistance(nerve block still in effect) Sling wearing schedule (on at all times/off for ADL's): Set-up    Home Living Family/patient expects to be discharged to:: Private residence Living Arrangements: Spouse/significant other Available Help at Discharge: Family Type of Home: House Home Access: Stairs to enter     Home Layout: Two level;Bed/bath upstairs     Bathroom Shower/Tub: Occupational psychologist: Handicapped height(the common space toilet is standard)     Home Equipment: None          Prior Functioning/Environment Level of Independence: Independent                 OT Problem List: Decreased strength;Decreased activity tolerance;Decreased safety awareness      OT Treatment/Interventions:      OT Goals(Current goals can be found in the care plan section)    OT Frequency:     Barriers to D/C:  Decreased caregiver support(pt plans to ask daughter for assist though she lives in [place])          Co-evaluation              AM-PAC OT "6 Clicks" Daily Activity     Outcome Measure Help from another person eating meals?: A Little Help from another person taking care of personal grooming?: A Little Help from another person toileting, which includes using toliet, bedpan, or urinal?: A Little Help from another person bathing (including washing, rinsing, drying)?: A Lot Help from another person to put on and taking off regular upper body clothing?: A Lot Help from another person to put on and taking off regular lower body clothing?: A Lot 6 Click Score: 15   End of Session Equipment Utilized During Treatment: Gait belt Nurse Communication: Mobility status;Precautions  Activity Tolerance: Patient tolerated treatment well;Patient limited by fatigue;Patient limited by pain Patient left: in bed;with call bell/phone within reach  OT Visit Diagnosis: Unsteadiness on feet (R26.81);Muscle weakness (generalized) (M62.81)                Time: 8466-5993 OT Time Calculation (min): 44 min Charges:  OT General Charges $OT Visit: 1 Visit OT Evaluation $OT Eval Moderate Complexity: 1 Mod OT Treatments $Self Care/Home Management : 8-22 mins $Therapeutic Exercise: 8-22 mins  Ebony Hail Harold Hedge) Marsa Aris OTR/L Pager: [phone]  Mitchell Heir  JELENEK 09/11/2018, 10:14 AM

## 2018-09-21 DIAGNOSIS — M25512 Pain in left shoulder: Secondary | ICD-10-CM | POA: Diagnosis not present

## 2018-09-21 DIAGNOSIS — Z471 Aftercare following joint replacement surgery: Secondary | ICD-10-CM | POA: Diagnosis not present

## 2018-10-08 DIAGNOSIS — M25512 Pain in left shoulder: Secondary | ICD-10-CM | POA: Diagnosis not present

## 2018-10-13 ENCOUNTER — Other Ambulatory Visit: Payer: Self-pay | Admitting: Family Medicine

## 2018-10-13 DIAGNOSIS — M25512 Pain in left shoulder: Secondary | ICD-10-CM | POA: Diagnosis not present

## 2018-10-13 DIAGNOSIS — G894 Chronic pain syndrome: Secondary | ICD-10-CM

## 2018-10-14 ENCOUNTER — Telehealth: Payer: Self-pay

## 2018-10-14 NOTE — Telephone Encounter (Signed)
Addressed.

## 2018-10-14 NOTE — Telephone Encounter (Signed)
Copied from [place] [id number]. Topic: General - Other >> Oct 12, 2018 12:22 PM Carolyn Stare wrote:  Pt call to say she has shoulder replacement surgery and because of PT her back spasm has returned and she is asking if Dr Lorelei Pont will give her a call to discuss what she should do.     [employer]

## 2018-10-14 NOTE — Telephone Encounter (Signed)
Last seen here in [place]  09/11/2018  1   09/11/2018  Oxycodone-Acetaminophen 5-325  30.00 5 Tr Shu  [id number]  Gat (0700)  0/0 45.00 MME Comm Ins  [place]  03/25/2018  1   03/11/2018  Hydrocodone-Acetamin 5-325 Mg  90.00 12 Je Cop  [id number]  Gat (0700)  0/0 37.50 MME Comm Ins  [place]  11/27/2017  1   11/27/2017  Hydrocodone-Acetamin 5-325 Mg  90.00 12 Je Cop  [id number]  Gat (0700)  0/0 37.50 MME Comm Ins  [place]  09/15/2017  1   09/15/2017  Hydrocodone-Acetamin 5-325 Mg  90.00 15 Je Cop  [id number]  Gat (0700)  0/0 30.00 MME Comm Ins  [place]  07/18/2017  1   06/30/2017  Hydrocodone-Acetamin 5-325 Mg  90.00 15 Je Cop  [id number]  Gat (0700)  0/0 30.00 MME Comm Ins  [place]  03/10/2017  1   03/06/2017  Hydrocodone-Acetamin 5-325 Mg  90.00 11 Je Cop  [id number]  Gat (0700)  0/0 40.91 MME Comm Ins  [place]  11/07/2016  1   11/06/2016  Hydrocodone-Acetamin 5-325 Mg  90.00 11 Je Cop  [id number]  Gat (0700)  0/0      Indication for chronic opioid:chronic back pain Medication and dose:hydrocodone 5 # pills per month:90 Last UDS date:6/19 Pain contract signed (Y/N):9/17 Date narcotic database last reviewed (include red flags):today  She had her shoulder replaced about one month ago  This went ok, she never took the oxycodone they gave her  She is doing PT, and this brought back her back spasms for which she has used hydrocodone in the past We had not needed to refill her hydrocodone since June Ok to refill today  She does not have any allergy sx with taking the hydrocodone   She is taking lisinopril/hctz a whole tablet (10/12.5) and her BP seems to be high when she first checks it, then will come down to 120/80s.

## 2018-10-14 NOTE — Telephone Encounter (Signed)
Copied from [place] [id number]. Topic: General - Other >> Oct 12, 2018 12:22 PM Dawn Powers wrote:  Pt call to say she has shoulder replacement surgery and because of PT her back spasm has returned and she is asking if Dr Lorelei Pont will give her a call to discuss what she should do.     [employer]

## 2018-10-15 DIAGNOSIS — M25512 Pain in left shoulder: Secondary | ICD-10-CM | POA: Diagnosis not present

## 2018-10-19 HISTORY — PX: BREAST LUMPECTOMY: SHX2

## 2018-10-20 DIAGNOSIS — M25512 Pain in left shoulder: Secondary | ICD-10-CM | POA: Diagnosis not present

## 2018-10-22 DIAGNOSIS — M25512 Pain in left shoulder: Secondary | ICD-10-CM | POA: Diagnosis not present

## 2018-10-27 DIAGNOSIS — M25512 Pain in left shoulder: Secondary | ICD-10-CM | POA: Diagnosis not present

## 2018-10-29 DIAGNOSIS — M25512 Pain in left shoulder: Secondary | ICD-10-CM | POA: Diagnosis not present

## 2018-11-03 DIAGNOSIS — M25512 Pain in left shoulder: Secondary | ICD-10-CM | POA: Diagnosis not present

## 2018-11-05 ENCOUNTER — Telehealth: Payer: Self-pay

## 2018-11-05 DIAGNOSIS — M25512 Pain in left shoulder: Secondary | ICD-10-CM | POA: Diagnosis not present

## 2018-11-05 NOTE — Telephone Encounter (Signed)
Outgoing call to patient to return her call, she wanted to make her April f/u appt with Dr Denman George.  Appt made for 4-15 at 11:45 am.  Pt agreeable and no other needs per pt at this time.

## 2018-11-10 DIAGNOSIS — M25512 Pain in left shoulder: Secondary | ICD-10-CM | POA: Diagnosis not present

## 2018-11-12 DIAGNOSIS — M25512 Pain in left shoulder: Secondary | ICD-10-CM | POA: Diagnosis not present

## 2018-11-17 DIAGNOSIS — M25512 Pain in left shoulder: Secondary | ICD-10-CM | POA: Diagnosis not present

## 2018-11-19 DIAGNOSIS — L814 Other melanin hyperpigmentation: Secondary | ICD-10-CM | POA: Diagnosis not present

## 2018-11-19 DIAGNOSIS — D485 Neoplasm of uncertain behavior of skin: Secondary | ICD-10-CM | POA: Diagnosis not present

## 2018-11-19 DIAGNOSIS — D225 Melanocytic nevi of trunk: Secondary | ICD-10-CM | POA: Diagnosis not present

## 2018-11-19 DIAGNOSIS — M25512 Pain in left shoulder: Secondary | ICD-10-CM | POA: Diagnosis not present

## 2018-11-19 DIAGNOSIS — L570 Actinic keratosis: Secondary | ICD-10-CM | POA: Diagnosis not present

## 2018-11-19 DIAGNOSIS — D1801 Hemangioma of skin and subcutaneous tissue: Secondary | ICD-10-CM | POA: Diagnosis not present

## 2018-11-19 DIAGNOSIS — L218 Other seborrheic dermatitis: Secondary | ICD-10-CM | POA: Diagnosis not present

## 2018-11-19 DIAGNOSIS — L821 Other seborrheic keratosis: Secondary | ICD-10-CM | POA: Diagnosis not present

## 2018-11-24 DIAGNOSIS — M25512 Pain in left shoulder: Secondary | ICD-10-CM | POA: Diagnosis not present

## 2018-11-26 DIAGNOSIS — M25512 Pain in left shoulder: Secondary | ICD-10-CM | POA: Diagnosis not present

## 2018-12-01 DIAGNOSIS — M25512 Pain in left shoulder: Secondary | ICD-10-CM | POA: Diagnosis not present

## 2018-12-03 DIAGNOSIS — M25512 Pain in left shoulder: Secondary | ICD-10-CM | POA: Diagnosis not present

## 2018-12-07 DIAGNOSIS — Z471 Aftercare following joint replacement surgery: Secondary | ICD-10-CM | POA: Diagnosis not present

## 2018-12-07 DIAGNOSIS — Z96612 Presence of left artificial shoulder joint: Secondary | ICD-10-CM | POA: Diagnosis not present

## 2018-12-08 DIAGNOSIS — M25512 Pain in left shoulder: Secondary | ICD-10-CM | POA: Diagnosis not present

## 2018-12-10 ENCOUNTER — Encounter: Payer: Self-pay | Admitting: Family Medicine

## 2018-12-10 DIAGNOSIS — I10 Essential (primary) hypertension: Secondary | ICD-10-CM

## 2018-12-11 MED ORDER — LOSARTAN POTASSIUM-HCTZ 50-12.5 MG PO TABS: 0.5000 | ORAL_TABLET | Freq: Every day | ORAL | 3 refills | Status: DC

## 2018-12-23 ENCOUNTER — Other Ambulatory Visit: Payer: Self-pay | Admitting: Family Medicine

## 2018-12-24 ENCOUNTER — Telehealth: Payer: Self-pay | Admitting: *Deleted

## 2018-12-24 NOTE — Telephone Encounter (Signed)
Called and spoke with  The patient regarding her appt for 4/15. Moved appt to 5/15 due to COVID-19. Explained that she may get a call the day before to pre-screen and again the day of the appt at the front desk. Also notified her of the no visitor policy

## 2019-01-13 ENCOUNTER — Ambulatory Visit: Payer: Medicare Other | Admitting: Gynecologic Oncology

## 2019-02-10 ENCOUNTER — Telehealth: Payer: Self-pay | Admitting: *Deleted

## 2019-02-10 ENCOUNTER — Encounter: Payer: Self-pay | Admitting: Gynecologic Oncology

## 2019-02-10 NOTE — Telephone Encounter (Signed)
Called and spoke with the patient, explained ho wto set up webex

## 2019-02-12 ENCOUNTER — Inpatient Hospital Stay: Payer: Medicare Other | Attending: Gynecologic Oncology | Admitting: Gynecologic Oncology

## 2019-02-12 ENCOUNTER — Encounter: Payer: Self-pay | Admitting: Gynecologic Oncology

## 2019-02-12 DIAGNOSIS — C541 Malignant neoplasm of endometrium: Secondary | ICD-10-CM

## 2019-02-12 DIAGNOSIS — Z90710 Acquired absence of both cervix and uterus: Secondary | ICD-10-CM

## 2019-02-12 DIAGNOSIS — Z923 Personal history of irradiation: Secondary | ICD-10-CM | POA: Diagnosis not present

## 2019-02-12 DIAGNOSIS — Z90722 Acquired absence of ovaries, bilateral: Secondary | ICD-10-CM | POA: Diagnosis not present

## 2019-02-12 NOTE — Patient Instructions (Signed)
Please return to see me in November.

## 2019-02-12 NOTE — Progress Notes (Signed)
Gynecologic Oncology Telehealth Follow-up Note: Gyn-Onc  I connected with Dawn Powers on 02/12/19 at 10:30 AM EDT by telephone and verified that I am speaking with the correct person using two identifiers.  I discussed the limitations, risks, security and privacy concerns of performing an evaluation and management service by telemedicine and the availability of in-person appointments. I also discussed with the patient that there may be a patient responsible charge related to this service. The patient expressed understanding and agreed to proceed.  Other persons participating in the visit and their role in the encounter: none.  Patient's location: Home Provider's location: [place]  Chief Complaint:  Chief Complaint  Patient presents with  . endometrial cancer   FOLLOW UP VISIT  Assessment:    73 y.o. 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   S/p robotic hysterectomy, BSO, sentinel lymph node biopsy on 09/26/15 with high intermediate risk factors for recurrence, s/p vaginal brachytherapy adjuvant therapy completed in March, 2017.  No evidence of recurrence on today's exam.  Plan: Discussed symptoms concerning for recurrence and recommendation to return to see [place] immediately should they develop. If her abdominal pains persist or become worse, she will notify us and we will order a CT scan.  Return to clinic in 6 months, or webex if she prefers.   HPI:  Dawn Powers is a 73 y.o. year old initially seen in consultation on 09/08/15 referred by Dr Meissinger for grade 2 endometrial cancer.  She then underwent a robotic hysterectomy, BSO and bilateral SLN biopsy on 75/64/33 without complications.  Her postoperative course was uncomplicated.  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 MSI  stable. She was recommended adjuvant vaginal brachytherapy for high/intermediate risk factors.  She completed vaginal brachytherapy in March, 2017.  She is seen today for a routine surveillance visit. She has no complaints concerning for recurrence. She is using her vaginal dilator but is not sexually active. No bleeding.  Her brothers have been diagnosed with prostate and renal cancer.   Interval Hx:  She had shoulder replacement surgery in December, 2019.   Past Medical History:  Diagnosis Date  . Arthritis   . Cancer [facility])    Endometrial   . Heart murmur   . Hyperlipidemia   . Hypertension   . Neuromuscular disorder ([place])    DDD  . Pain    lower back -hx "DDD"  . Radiation 11/15/15, 11/23/15, 11/30/15, 12/07/15, 12/14/15   HDR brachytherapy   Past Surgical History:  Procedure Laterality Date  . ABDOMINAL HYSTERECTOMY    . COLONOSCOPY    . FRACTURE SURGERY Right    ankle  . GANGLION CYST EXCISION    . LYMPH NODE BIOPSY N/A 09/26/2015   Procedure: SENTINEL LYMPH NODE BIOPSY;  Surgeon: Everitt Amber, MD;  Location: [facility];  Service: Gynecology;  Laterality: N/A;  . ROBOTIC ASSISTED TOTAL HYSTERECTOMY WITH BILATERAL SALPINGO OOPHERECTOMY Bilateral 09/26/2015   Procedure: ROBOTIC ASSISTED TOTAL HYSTERECTOMY WITH BILATERAL SALPINGO OOPHORECTOMY;  Surgeon: Everitt Amber, MD;  Location: [facility];  Service: Gynecology;  Laterality: Bilateral;  . TOTAL SHOULDER ARTHROPLASTY Left 09/10/2018   Procedure: TOTAL SHOULDER ARTHROPLASTY;  Surgeon: Justice Britain, MD;  Location: [place];  Service: Orthopedics;  Laterality: Left;  141mn  . WISDOM TOOTH EXTRACTION     Family History  Problem Relation Age of  Onset  . Heart disease Mother   . Alcohol abuse Mother        breast cancer - questionable   . Dementia Mother   . Alcohol abuse Father   . Prostate cancer Father 52       prostate and colon- unsure of primary   . Kidney cancer Brother   . Alcohol abuse Maternal Grandfather   . Alcohol abuse  Sister   . Esophageal cancer Neg Hx   . Stomach cancer Neg Hx   . Rectal cancer Neg Hx    Social History   Socioeconomic History  . Marital status: Married    Spouse name: Not on file  . Number of children: 2  . Years of education: Not on file  . Highest education level: Not on file  Occupational History  . Not on file  Social Needs  . Financial resource strain: Not on file  . Food insecurity:    Worry: Not on file    Inability: Not on file  . Transportation needs:    Medical: Not on file    Non-medical: Not on file  Tobacco Use  . Smoking status: Former Smoker    Years: 4.00    Types: Cigarettes    Last attempt to quit: 09/30/1998    Years since quitting: 20.3  . Smokeless tobacco: Never Used  Substance and Sexual Activity  . Alcohol use: Yes    Alcohol/week: 3.0 standard drinks    Types: 3 Standard drinks or equivalent per week    Comment: WINE OR BOURBON - weekly  . Drug use: No  . Sexual activity: Yes    Partners: Female    Birth control/protection: None  Lifestyle  . Physical activity:    Days per week: Not on file    Minutes per session: Not on file  . Stress: Not on file  Relationships  . Social connections:    Talks on phone: Not on file    Gets together: Not on file    Attends religious service: Not on file    Active member of club or organization: Not on file    Attends meetings of clubs or organizations: Not on file    Relationship status: Not on file  . Intimate partner violence:    Fear of current or ex partner: Not on file    Emotionally abused: Not on file    Physically abused: Not on file    Forced sexual activity: Not on file  Other Topics Concern  . Not on file  Social History Narrative   Married. Education: college and Other. Exercise 4 times a week for 50 minutes.   Current Outpatient Medications on File Prior to Visit  Medication Sig Dispense Refill  . cyclobenzaprine (FLEXERIL) 10 MG tablet Take 1 tablet (10 mg total) by mouth 3  (three) times daily as needed for muscle spasms. 30 tablet 1  . HYDROcodone-acetaminophen (NORCO/VICODIN) 5-325 MG tablet TAKE 1 OR 2 TABLETS EVERY 6 HOURS AS NEEDED FOR PAIN. 90 tablet 0  . lisinopril-hydrochlorothiazide (PRINZIDE,ZESTORETIC) 10-12.5 MG tablet TAKE 1 TABLET ONCE DAILY. 30 tablet 5  . losartan-hydrochlorothiazide (HYZAAR) 50-12.5 MG tablet Take 0.5 tablets by mouth daily. 45 tablet 3  . magnesium oxide (MAG-OX) 400 MG tablet Take 400 mg by mouth as needed.    . ondansetron (ZOFRAN) 4 MG tablet Take 1 tablet (4 mg total) by mouth every 6 (six) hours as needed for nausea. 20 tablet 0  . oxyCODONE-acetaminophen (PERCOCET) 5-325 MG  tablet Take 1 tablet by mouth every 4 (four) hours as needed (max 6 q). 30 tablet 0  . Propylene Glycol (SYSTANE BALANCE OP) Place 1 drop into both eyes daily as needed (dry eyes).     . traZODone (DESYREL) 50 MG tablet TAKE 1-1&1/2 TABLETS AT BEDTIME AS NEEDED FOR SLEEP. (Patient taking differently: Take 50-75 mg by mouth at bedtime as needed for sleep. TAKE 1-1&1/2 TABLETS AT BEDTIME AS NEEDED FOR SLEEP.) 45 tablet 2   No current facility-administered medications on file prior to visit.    Allergies  Allergen Reactions  . Morphine And Related Hives      Review of systems: Constitutional:  She has no weight gain or weight loss. She has no fever or chills. Eyes: No blurred vision Ears, Nose, Mouth, Throat: No dizziness, headaches or changes in hearing. No mouth sores. Cardiovascular: No chest pain, palpitations or edema. Respiratory:  No shortness of breath, wheezing or cough Gastrointestinal: She has normal bowel movements without diarrhea or constipation. She denies any nausea or vomiting. She denies blood in her stool or heart burn. Occasional pelvic twinges. Genitourinary:  She denies pelvic pain, pelvic pressure or changes in her urinary function. She has no hematuria, dysuria, or incontinence. She has no irregular vaginal bleeding or vaginal  discharge Musculoskeletal: Denies muscle weakness or joint pains.  Skin:  She has no skin changes, rashes or itching Neurological: resolved neuropathic back pain. Psychiatric:  She denies depression or anxiety. Hematologic/Lymphatic:   No easy bruising or bleeding   Physical Exam: There were no vitals taken for this visit. Deferred.  I discussed the assessment and treatment plan with the patient. The patient was provided with an opportunity to ask questions and all were answered. The patient agreed with the plan and demonstrated an understanding of the instructions.   The patient was advised to call back or see an in-person evaluation if the symptoms worsen or if the condition fails to improve as anticipated.   I provided 12 minutes of face-to-face video visit time during this encounter, and > 50% was spent counseling as documented under my assessment & plan.   Thereasa Solo, MD

## 2019-02-25 ENCOUNTER — Encounter: Payer: Self-pay | Admitting: Family Medicine

## 2019-03-01 ENCOUNTER — Ambulatory Visit: Payer: Medicare Other | Admitting: Family Medicine

## 2019-03-15 ENCOUNTER — Other Ambulatory Visit: Payer: Self-pay

## 2019-03-15 ENCOUNTER — Ambulatory Visit (INDEPENDENT_AMBULATORY_CARE_PROVIDER_SITE_OTHER): Payer: Medicare Other | Admitting: Medical

## 2019-03-15 ENCOUNTER — Encounter: Payer: Self-pay | Admitting: Medical

## 2019-03-15 ENCOUNTER — Ambulatory Visit: Payer: Medicare Other | Admitting: Medical

## 2019-03-15 VITALS — BP 131/69 | HR 73 | Resp 16 | Ht 63.5 in | Wt 128.6 lb

## 2019-03-15 DIAGNOSIS — H6122 Impacted cerumen, left ear: Secondary | ICD-10-CM

## 2019-03-15 MED ORDER — NEOMYCIN-POLYMYXIN-HC 3.5-10000-1 OT SOLN: OTIC | 0 refills | Status: DC

## 2019-03-15 NOTE — Patient Instructions (Addendum)
You do have some wax present and deep in canal. I adjusted wax and opening in wax now but unable to remove all of wax(symtoms resolved). You got anxious during lavage when wax plugged sensation worsened.  Presently no tx but if recurrent symptoms occur [place] cortisporin as advised and notify us if you start.  Anxiety I think is directly related to being in office during pandemic. Will follow this and see if present otherwise.  Follow up as needed

## 2019-03-15 NOTE — Progress Notes (Addendum)
Subjective:    Patient ID: Dawn Powers, female    DOB: Jul 10, 1946, 73 y.o.   MRN: [id number]  HPI  Pt in states she has left ear feeling of obstruction. She has had plugged feeling for 4 weeks. At night feels like can hear wax moves. Effecting her hearing. Pt had irrigation of her left ear in past long time ago per pt. Over one year. Describes difficult removal requiring second lavage.  Pt admits very anxious just being here since she has not been many places during pandemic.     Review of Systems  Constitutional: Negative for chills, fatigue and fever.  HENT: Negative for congestion, ear pain, rhinorrhea, sinus pressure and sore throat.        Ear plugged sensation.  Respiratory: Negative for cough, chest tightness, shortness of breath and wheezing.   Cardiovascular: Negative for chest pain and palpitations.  Gastrointestinal: Negative for abdominal pain.  Musculoskeletal: Negative for back pain.  Skin: Negative for rash.  Neurological: Negative for dizziness, seizures, light-headedness and headaches.  Hematological: Negative for adenopathy. Does not bruise/bleed easily.  Psychiatric/Behavioral: Negative for confusion.   Past Medical History:  Diagnosis Date  . Arthritis   . Cancer [facility])    Endometrial   . Heart murmur   . Hyperlipidemia   . Hypertension   . Neuromuscular disorder ([place])    DDD  . Pain    lower back -hx "DDD"  . Radiation 11/15/15, 11/23/15, 11/30/15, 12/07/15, 12/14/15   HDR brachytherapy     Social History   Socioeconomic History  . Marital status: Married    Spouse name: Not on file  . Number of children: 2  . Years of education: Not on file  . Highest education level: Not on file  Occupational History  . Not on file  Social Needs  . Financial resource strain: Not on file  . Food insecurity    Worry: Not on file    Inability: Not on file  . Transportation needs    Medical: Not on file    Non-medical: Not on file  Tobacco Use  . Smoking  status: Former Smoker    Years: 4.00    Types: Cigarettes    Quit date: 09/30/1998    Years since quitting: 20.4  . Smokeless tobacco: Never Used  Substance and Sexual Activity  . Alcohol use: Yes    Alcohol/week: 3.0 standard drinks    Types: 3 Standard drinks or equivalent per week    Comment: WINE OR BOURBON - weekly  . Drug use: No  . Sexual activity: Yes    Partners: Female    Birth control/protection: None  Lifestyle  . Physical activity    Days per week: Not on file    Minutes per session: Not on file  . Stress: Not on file  Relationships  . Social Herbalist on phone: Not on file    Gets together: Not on file    Attends religious service: Not on file    Active member of club or organization: Not on file    Attends meetings of clubs or organizations: Not on file    Relationship status: Not on file  . Intimate partner violence    Fear of current or ex partner: Not on file    Emotionally abused: Not on file    Physically abused: Not on file    Forced sexual activity: Not on file  Other Topics Concern  . Not on file  Social History Narrative   Married. Education: college and Other. Exercise 4 times a week for 50 minutes.    Past Surgical History:  Procedure Laterality Date  . ABDOMINAL HYSTERECTOMY    . COLONOSCOPY    . FRACTURE SURGERY Right    ankle  . GANGLION CYST EXCISION    . LYMPH NODE BIOPSY N/A 09/26/2015   Procedure: SENTINEL LYMPH NODE BIOPSY;  Surgeon: Everitt Amber, MD;  Location: [facility];  Service: Gynecology;  Laterality: N/A;  . ROBOTIC ASSISTED TOTAL HYSTERECTOMY WITH BILATERAL SALPINGO OOPHERECTOMY Bilateral 09/26/2015   Procedure: ROBOTIC ASSISTED TOTAL HYSTERECTOMY WITH BILATERAL SALPINGO OOPHORECTOMY;  Surgeon: Everitt Amber, MD;  [facility];  Service: Gynecology;  Laterality: Bilateral;  . TOTAL SHOULDER ARTHROPLASTY Left 09/10/2018   Procedure: TOTAL SHOULDER ARTHROPLASTY;  Surgeon: Justice Britain, MD;  Location: [place];  Service:  Orthopedics;  Laterality: Left;  163min  . WISDOM TOOTH EXTRACTION      Family History  Problem Relation Age of Onset  . Heart disease Mother   . Alcohol abuse Mother        breast cancer - questionable   . Dementia Mother   . Alcohol abuse Father   . Prostate cancer Father 38       prostate and colon- unsure of primary   . Kidney cancer Brother   . Alcohol abuse Maternal Grandfather   . Alcohol abuse Sister   . Esophageal cancer Neg Hx   . Stomach cancer Neg Hx   . Rectal cancer Neg Hx     Allergies  Allergen Reactions  . Morphine And Related Hives    Current Outpatient Medications on File Prior to Visit  Medication Sig Dispense Refill  . cyclobenzaprine (FLEXERIL) 10 MG tablet Take 1 tablet (10 mg total) by mouth 3 (three) times daily as needed for muscle spasms. 30 tablet 1  . HYDROcodone-acetaminophen (NORCO/VICODIN) 5-325 MG tablet TAKE 1 OR 2 TABLETS EVERY 6 HOURS AS NEEDED FOR PAIN. 90 tablet 0  . lisinopril-hydrochlorothiazide (PRINZIDE,ZESTORETIC) 10-12.5 MG tablet TAKE 1 TABLET ONCE DAILY. 30 tablet 5  . losartan-hydrochlorothiazide (HYZAAR) 50-12.5 MG tablet Take 0.5 tablets by mouth daily. 45 tablet 3  . magnesium oxide (MAG-OX) 400 MG tablet Take 400 mg by mouth as needed.    . ondansetron (ZOFRAN) 4 MG tablet Take 1 tablet (4 mg total) by mouth every 6 (six) hours as needed for nausea. 20 tablet 0  . oxyCODONE-acetaminophen (PERCOCET) 5-325 MG tablet Take 1 tablet by mouth every 4 (four) hours as needed (max 6 q). 30 tablet 0  . Propylene Glycol (SYSTANE BALANCE OP) Place 1 drop into both eyes daily as needed (dry eyes).     . traZODone (DESYREL) 50 MG tablet TAKE 1-1&1/2 TABLETS AT BEDTIME AS NEEDED FOR SLEEP. (Patient taking differently: Take 50-75 mg by mouth at bedtime as needed for sleep. TAKE 1-1&1/2 TABLETS AT BEDTIME AS NEEDED FOR SLEEP.) 45 tablet 2   No current facility-administered medications on file prior to visit.     BP 131/69   Pulse 73   Resp  16   Ht 5' 3.5" (1.613 m)   Wt 128 lb 9.6 oz (58.3 kg)   SpO2 100%   BMI 22.42 kg/m       Objective:   Physical Exam  General  Mental Status - Alert. General Appearance - Well groomed. Not in acute distress.(but mid way through exam pacing after lavage of ear and anxious since had transient increase plug sensation)  Skin  Rashes- No Rashes.  HEENT Head- Normal. Ear Auditory Canal - Left- Normal. Right - Normal.Tympanic Membrane- Left- wax obstruction (post lavage tm intct. Water present in canal. I used currete and moved wax but was not able to get any out. Pt states after lavage worse plug sensation but after I used currete symptoms resolved.Right-  Mild wax present on rt side. Eye Sclera/Conjunctiva- Left- Normal. Right- Normal. Nose & Sinuses Nasal Mucosa- Left-  Boggy and Congested. Right-  Boggy and  Congested.Bilateral no  maxillary and no  frontal sinus pressure. Mouth & Throat Lips: Upper Lip- Normal: no dryness, cracking, pallor, cyanosis, or vesicular eruption. Lower Lip-Normal: no dryness, cracking, pallor, cyanosis or vesicular eruption. Buccal Mucosa- Bilateral- No Aphthous ulcers. Oropharynx- No Discharge or Erythema. Tonsils: Characteristics- Bilateral- No Erythema or Congestion. Size/Enlargement- Bilateral- No enlargement. Discharge- bilateral-None.  Neck Neck- Supple. No Masses.   Chest and Lung Exam Auscultation: Breath Sounds:-Clear even and unlabored.  Cardiovascular Auscultation:Rythm- Regular, rate and rhythm. Murmurs & Other Heart Sounds:Ausculatation of the heart reveal- No Murmurs.  Lymphatic Head & Neck General Head & Neck Lymphatics: Bilateral: Description- No Localized lymphadenopathy.      Assessment & Plan:  You do have some wax present and deep in canal. I adjusted wax and made opening in wax but unable to remove all of wax. You got anxious during lavage when wax plugged sensation worsened. Pt left office stating best that her ear has  felt in sometime after I use curette and adjusted/moved was but did not remove.  Presently no tx but if recurrent symptoms occur [place] cortisprorin drops as advised and notify us if you start.  Anxiety I think is directly related to being in office during pandemic. Will follow this and see if present otherwise.  Follow up as needed  25 minute total spent with pt. 50% of time spent with pt counseling going forward on treatment plan in light of her transiently worse symptoms when lavage attempted and persisting wax in canal.

## 2019-03-16 ENCOUNTER — Other Ambulatory Visit: Payer: Self-pay | Admitting: Family Medicine

## 2019-03-16 DIAGNOSIS — G4700 Insomnia, unspecified: Secondary | ICD-10-CM

## 2019-03-19 ENCOUNTER — Telehealth: Payer: Self-pay

## 2019-03-19 ENCOUNTER — Encounter: Payer: Self-pay | Admitting: Family Medicine

## 2019-03-19 MED ORDER — TRAZODONE HCL 50 MG PO TABS: ORAL_TABLET | ORAL | 6 refills | Status: DC

## 2019-03-19 NOTE — Telephone Encounter (Signed)
Copied from [place] [id number] [phone]. Topic: Quick Communication - Rx Refill/Question >> Mar 19, 2019  9:21 AM Parke Poisson wrote: Medication: traZODone (DESYREL) 50 MG tablet  Has the patient contacted their pharmacy? Yes  (Agent: If yes, when and what did the pharmacy advise?)Pharmacy states correspondence has been sent several times but no response  Preferred Pharmacy (with phone number or street name): [facility], [address] [phone] (Phone) [phone] (Fax)    Agent: Please be advised that RX refills may take up to 3 business days. We ask that you follow-up with your pharmacy.

## 2019-03-19 NOTE — Telephone Encounter (Signed)
Refilled in separate encounter

## 2019-04-22 ENCOUNTER — Encounter: Payer: Self-pay | Admitting: Family Medicine

## 2019-04-26 ENCOUNTER — Encounter: Payer: Self-pay | Admitting: Family Medicine

## 2019-04-26 DIAGNOSIS — I10 Essential (primary) hypertension: Secondary | ICD-10-CM

## 2019-04-26 DIAGNOSIS — G894 Chronic pain syndrome: Secondary | ICD-10-CM

## 2019-04-27 MED ORDER — HYDROCODONE-ACETAMINOPHEN 5-325 MG PO TABS: ORAL_TABLET | ORAL | 0 refills | Status: DC

## 2019-04-27 MED ORDER — LOSARTAN POTASSIUM-HCTZ 50-12.5 MG PO TABS: 0.5000 | ORAL_TABLET | Freq: Every day | ORAL | 3 refills | Status: DC

## 2019-04-27 NOTE — Addendum Note (Signed)
Addended by: Lamar Blinks C on: 04/27/2019 12:21 PM   Modules accepted: Orders

## 2019-04-28 ENCOUNTER — Other Ambulatory Visit: Payer: Self-pay

## 2019-05-25 ENCOUNTER — Other Ambulatory Visit: Payer: Self-pay | Admitting: *Deleted

## 2019-05-25 MED ORDER — ZOSTER VAC RECOMB ADJUVANTED 50 MCG/0.5ML IM SUSR: 0.5000 mL | Freq: Once | INTRAMUSCULAR | 1 refills | Status: AC

## 2019-06-17 ENCOUNTER — Encounter: Payer: Self-pay | Admitting: Family Medicine

## 2019-06-22 ENCOUNTER — Ambulatory Visit: Payer: Medicare Other

## 2019-06-22 NOTE — Patient Instructions (Signed)
Great to see you again today- I will be in touch with your labs Please consider getting the shingrix vaccine for shingles at your drug store

## 2019-06-22 NOTE — Progress Notes (Signed)
[place] [facility] [address], [place], [place] [address] [phone]  Date:  06/24/2019   Name:  Dawn Powers   DOB:  1946-01-29   MRN:  [id number]  PCP:  Darreld Mclean, MD    Chief Complaint: No chief complaint on file.   History of Present Illness:  Dawn Powers is a 73 y.o. very pleasant female patient who presents with the following:  Following up today-we had planned for an in person visit, but converted to a virtual visit due to an episode of chronic back pain Patient location is home, provider is at office Patient identity confirmed with 2 identifiers, she gives consent for virtual visit today History of HTN, endometrial cancer s/p hyst 2016, osteopenia, back pain/ chronic pain  Last seen by myself in November of 2019  She occasionally uses hydrocodone for pain : 04/27/2019  1   04/27/2019  Hydrocodone-Acetamin 5-325 MG  90.00  12 Je Cop   [id number]   Gat (0700)   0  37.50 MME  Comm Ins   [place]  10/14/2018  1   10/14/2018  Hydrocodone-Acetamin 5-325 MG  90.00  12 Je Cop   [id number]   Gat (0700)   0  37.50 MME  Comm Ins   [place]  09/11/2018  1   09/11/2018  Oxycodone-Acetaminophen 5-325  30.00  5 Tr Shu   [id number]   Gat (0700)   0  45.00 MME  Comm Ins   [place]  03/25/2018  1   03/11/2018  Hydrocodone-Acetamin 5-325 MG  90.00  12 Je Cop   [id number]   Gat (0700)   0  37.50 MME  Comm Ins   [place]  11/27/2017  1   11/27/2017  Hydrocodone-Acetamin 5-325 MG  90.00  12 Je Cop   [id number]   Gat (0700)   0  37.50 MME      Tetanus Colon is UTD Dexa UTD Flu - needs to be done  Shingrix - she got one dose but is waiting on the 2nd dose, per her pharmacy First dose 3 weeks ago   Labs done in December- can do routine labs for her at her convenience Needs UDS  Saw her GYN/ONC Dr Denman George in May of this year- no evidence of recurrence   Today she notes that "sometimes I cannot function and that is this morning" She notes back spasms that  hit her every few days. She will stretch and use heat, and this helps.  She also took 1.5 hydrocodone this am but it has not started working yet. She notes that her sx generally last 2 hours, sometimes 4-6 hours, but consistently it will go away after a few hours.  She has suffered from this issue for years, and has has extensive evaluation and treatment.  She just manages it now as above  She notes that a few weeks ago she leaned on the edge of the tub while cleaning it, and had pain in her right lower ribs.  This is the same area she hurt when she fell at the gym years ago.  The area was quite tender at first, it is getting better gradually.  Offered to order films for her, she declines for now  She feels like her diuretic maybe causing some urinary frequency and dry mouth.  Will have her try plain losartan instead  She is taking losartan/hctz 50/12.5- 1/2 tablet daily Will change her over  to plain losartan 50 mg  Patient Active Problem List   Diagnosis Date Noted  . 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 Endometrial cancer ([place]) 09/26/2015  . Essential hypertension 05/23/2015  . Thoracic facet joint syndrome 04/27/2014  . Back pain 03/24/2014  . Chronic pain syndrome 03/24/2014  . Scoliosis (and kyphoscoliosis), idiopathic 12/22/2012  . Osteopenia 12/22/2012  . Other and unspecified hyperlipidemia 12/22/2012    Past Medical History:  Diagnosis Date  . Arthritis   . Cancer [facility])    Endometrial   . Heart murmur   . Hyperlipidemia   . Hypertension   . Neuromuscular disorder ([place])    DDD  . Pain    lower back -hx "DDD"  . Radiation 11/15/15, 11/23/15, 11/30/15, 12/07/15, 12/14/15   HDR brachytherapy    Past Surgical History:  Procedure Laterality Date  . ABDOMINAL HYSTERECTOMY    . COLONOSCOPY    . FRACTURE SURGERY Right    ankle  . GANGLION CYST EXCISION    . LYMPH NODE BIOPSY N/A 09/26/2015   Procedure: SENTINEL LYMPH NODE BIOPSY;  Surgeon: Everitt Amber, MD;  [facility];  Service: Gynecology;  Laterality: N/A;  . ROBOTIC ASSISTED TOTAL HYSTERECTOMY WITH BILATERAL SALPINGO OOPHERECTOMY Bilateral 09/26/2015   Procedure: ROBOTIC ASSISTED TOTAL HYSTERECTOMY WITH BILATERAL SALPINGO OOPHORECTOMY;  Surgeon: Everitt Amber, MD;  [facility];  Service: Gynecology;  Laterality: Bilateral;  . TOTAL SHOULDER ARTHROPLASTY Left 09/10/2018   Procedure: TOTAL SHOULDER ARTHROPLASTY;  Surgeon: Justice Britain, MD;  Location: [place];  Service: Orthopedics;  Laterality: Left;  141min  . WISDOM TOOTH EXTRACTION      Social History   Tobacco Use  . Smoking status: Former Smoker    Years: 4.00    Types: Cigarettes    Quit date: 09/30/1998    Years since quitting: 20.7  . Smokeless tobacco: Never Used  Substance Use Topics  . Alcohol use: Yes    Alcohol/week: 3.0 standard drinks    Types: 3 Standard drinks or equivalent per week    Comment: WINE OR BOURBON - weekly  . Drug use: No    Family History  Problem Relation Age of Onset  . Heart disease Mother   . Alcohol abuse Mother        breast cancer - questionable   . Dementia Mother   . Alcohol abuse Father   . Prostate cancer Father 22       prostate and colon- unsure of primary   . Kidney cancer Brother   . Alcohol abuse Maternal Grandfather   . Alcohol abuse Sister   . Esophageal cancer Neg Hx   . Stomach cancer Neg Hx   . Rectal cancer Neg Hx     Allergies  Allergen Reactions  . Morphine And Related Hives    Medication list has been reviewed and updated.  Current Outpatient Medications on File Prior to Visit  Medication Sig Dispense Refill  . cyclobenzaprine (FLEXERIL) 10 MG tablet Take 1 tablet (10 mg total) by mouth 3 (three) times daily as needed for muscle spasms. 30 tablet 1  . HYDROcodone-acetaminophen (NORCO/VICODIN) 5-325 MG tablet TAKE 1 OR 2 TABLETS EVERY 6 HOURS AS NEEDED FOR PAIN. 90 tablet 0  . losartan-hydrochlorothiazide (HYZAAR) 50-12.5 MG tablet Take 0.5 tablets by mouth daily. 45  tablet 3  . magnesium oxide (MAG-OX) 400 MG tablet Take 400 mg by mouth as needed.    . neomycin-polymyxin-hydrocortisone (CORTISPORIN) OTIC solution 1 drop to ear 3 times daily 10  mL 0  . ondansetron (ZOFRAN) 4 MG tablet Take 1 tablet (4 mg total) by mouth every 6 (six) hours as needed for nausea. 20 tablet 0  . Propylene Glycol (SYSTANE BALANCE OP) Place 1 drop into both eyes daily as needed (dry eyes).     . traZODone (DESYREL) 50 MG tablet Take 1-1.5 tablets (50-75 mg total) by mouth at bedtime as needed for sleep. TAKE 1-1&1/2 TABLETS AT BEDTIME AS NEEDED FOR SLEEP. 45 tablet 3  . traZODone (DESYREL) 50 MG tablet 1 to 1 1/2 tabs po at night as needed 45 tablet 6   No current facility-administered medications on file prior to visit.     Review of Systems:  As per HPI- otherwise negative. No fever or chills  Physical Examination: There were no vitals filed for this visit. There were no vitals filed for this visit. There is no height or weight on file to calculate BMI. Ideal Body Weight:    She notes that her SBP may be in the 150s at first check DBP generally in the 60s SBP will improve to the 130s when she relaxes for a few minutes   Spoke with patient on the phone.  She sounds well, no cough, shortness of breath, distress is noted Assessment and Plan: Essential hypertension - Plan: CBC, Comprehensive metabolic panel, losartan (COZAAR) 50 MG tablet  Chronic pain syndrome - Plan: Pain Mgmt, Profile 8 w/Conf, U  Screening for deficiency anemia - Plan: CBC  Screening for diabetes mellitus - Plan: Comprehensive metabolic panel, Hemoglobin A1c  Dyslipidemia - Plan: Lipid panel  Endometrial cancer ([place])  Elevated glucose - Plan: Hemoglobin A1c  Whisper would like to try stopping her HCTZ and taking plain losartan.  Will DC her combination product, Rx for losartan 50 mg.  She plans to start on 1/2 tablet first, which is fine.  She will continue to monitor blood pressure at  home We will schedule her for a lab visit at her earliest convenience to get lab work as above She plans to get her flu shot at the pharmacy Will send her vitamin D and calcium recs in her mychart message   Spoke to pt on the phone today for 20 minutes Mel Almond will call and have her set up for a lab appt  Signed Lamar Blinks, MD

## 2019-06-23 ENCOUNTER — Other Ambulatory Visit: Payer: Self-pay

## 2019-06-24 ENCOUNTER — Encounter: Payer: Self-pay | Admitting: Family Medicine

## 2019-06-24 ENCOUNTER — Ambulatory Visit (INDEPENDENT_AMBULATORY_CARE_PROVIDER_SITE_OTHER): Payer: Medicare Other | Admitting: Family Medicine

## 2019-06-24 DIAGNOSIS — G894 Chronic pain syndrome: Secondary | ICD-10-CM

## 2019-06-24 DIAGNOSIS — Z130 Encounter for screening for diseases of the blood and blood-forming organs and certain disorders involving the immune mechanism: Secondary | ICD-10-CM | POA: Diagnosis not present

## 2019-06-24 DIAGNOSIS — R7309 Other abnormal glucose: Secondary | ICD-10-CM

## 2019-06-24 DIAGNOSIS — C541 Malignant neoplasm of endometrium: Secondary | ICD-10-CM

## 2019-06-24 DIAGNOSIS — Z131 Encounter for screening for diabetes mellitus: Secondary | ICD-10-CM | POA: Diagnosis not present

## 2019-06-24 DIAGNOSIS — I10 Essential (primary) hypertension: Secondary | ICD-10-CM | POA: Diagnosis not present

## 2019-06-24 DIAGNOSIS — E785 Hyperlipidemia, unspecified: Secondary | ICD-10-CM

## 2019-06-24 MED ORDER — LOSARTAN POTASSIUM 50 MG PO TABS: 50.0000 mg | ORAL_TABLET | Freq: Every day | ORAL | 6 refills | Status: DC

## 2019-07-01 ENCOUNTER — Ambulatory Visit (INDEPENDENT_AMBULATORY_CARE_PROVIDER_SITE_OTHER): Payer: Medicare Other

## 2019-07-01 ENCOUNTER — Other Ambulatory Visit (INDEPENDENT_AMBULATORY_CARE_PROVIDER_SITE_OTHER): Payer: Medicare Other

## 2019-07-01 ENCOUNTER — Other Ambulatory Visit: Payer: Self-pay

## 2019-07-01 DIAGNOSIS — E785 Hyperlipidemia, unspecified: Secondary | ICD-10-CM

## 2019-07-01 DIAGNOSIS — Z131 Encounter for screening for diabetes mellitus: Secondary | ICD-10-CM

## 2019-07-01 DIAGNOSIS — G894 Chronic pain syndrome: Secondary | ICD-10-CM

## 2019-07-01 DIAGNOSIS — R7309 Other abnormal glucose: Secondary | ICD-10-CM

## 2019-07-01 DIAGNOSIS — Z130 Encounter for screening for diseases of the blood and blood-forming organs and certain disorders involving the immune mechanism: Secondary | ICD-10-CM

## 2019-07-01 DIAGNOSIS — I10 Essential (primary) hypertension: Secondary | ICD-10-CM

## 2019-07-01 DIAGNOSIS — Z23 Encounter for immunization: Secondary | ICD-10-CM

## 2019-07-01 LAB — CBC
HCT: 39.3 % (ref 36.0–46.0)
Hemoglobin: 13.2 g/dL (ref 12.0–15.0)
MCHC: 33.7 g/dL (ref 30.0–36.0)
MCV: 97.7 fl (ref 78.0–100.0)
Platelets: 249 10*3/uL (ref 150.0–400.0)
RBC: 4.02 Mil/uL (ref 3.87–5.11)
RDW: 12.9 % (ref 11.5–15.5)
WBC: 4.5 10*3/uL (ref 4.0–10.5)

## 2019-07-01 LAB — COMPREHENSIVE METABOLIC PANEL
ALT: 13 U/L (ref 0–35)
AST: 17 U/L (ref 0–37)
Albumin: 4.5 g/dL (ref 3.5–5.2)
Alkaline Phosphatase: 62 U/L (ref 39–117)
BUN: 21 mg/dL (ref 6–23)
CO2: 28 mEq/L (ref 19–32)
Calcium: 9.8 mg/dL (ref 8.4–10.5)
Chloride: 103 mEq/L (ref 96–112)
Creatinine, Ser: 0.98 mg/dL (ref 0.40–1.20)
GFR: 55.29 mL/min — ABNORMAL LOW (ref >60.00)
Glucose, Bld: 94 mg/dL (ref 70–99)
Potassium: 4.2 mEq/L (ref 3.5–5.1)
Sodium: 140 mEq/L (ref 135–145)
Total Bilirubin: 0.5 mg/dL (ref 0.2–1.2)
Total Protein: 6.8 g/dL (ref 6.0–8.3)

## 2019-07-01 LAB — HEMOGLOBIN A1C: Hgb A1c MFr Bld: 5.8 % (ref 4.6–6.5)

## 2019-07-01 LAB — LIPID PANEL
Cholesterol: 209 mg/dL — ABNORMAL HIGH (ref 0–200)
HDL: 80.7 mg/dL (ref >39.00)
LDL Cholesterol: 112 mg/dL — ABNORMAL HIGH (ref 0–99)
NonHDL: 128.37
Total CHOL/HDL Ratio: 3
Triglycerides: 80 mg/dL (ref 0.0–149.0)
VLDL: 16 mg/dL (ref 0.0–40.0)

## 2019-07-01 NOTE — Progress Notes (Signed)
Pre visit review using our clinic review tool, if applicable. No additional management support is needed unless otherwise documented below in the visit note.  Patient here for flu vaccine. 0.54mL flu vaccine given in deltoid left deltoid IM. Patient tolerated well. VIS given.

## 2019-07-02 ENCOUNTER — Encounter: Payer: Self-pay | Admitting: Family Medicine

## 2019-07-02 ENCOUNTER — Ambulatory Visit: Payer: Self-pay | Admitting: *Deleted

## 2019-07-02 DIAGNOSIS — R7303 Prediabetes: Secondary | ICD-10-CM | POA: Insufficient documentation

## 2019-07-02 NOTE — Telephone Encounter (Signed)
Pt called after seeing her lab results on her MyChart. She was questioning what the GFR was.   I explained it was a measure of how well the kidneys are filtering waste  From the body.  Dr. Lorelei Pont said yours is decreased so wants to recheck it in 4 months. She asked about the [facility].   "That looks normal".    "Am I becoming diabetic"?    I let her know to continue exercising (she walks 40 min every day) and watch her sugar and carb intake.   Dr. Lorelei Pont does not want you to become diabetic.    "I do eat too many cookies".    She is going to cut back on her snacks.  I warm transferred her call into Dr. Lillie Fragmin office to be scheduled for her 4 month follow up.

## 2019-07-03 LAB — PAIN MGMT, PROFILE 8 W/CONF, U
6 Acetylmorphine: NEGATIVE ng/mL
Alcohol Metabolites: POSITIVE ng/mL — AB (ref <500)
Amphetamines: NEGATIVE ng/mL
Benzodiazepines: NEGATIVE ng/mL
Buprenorphine, Urine: NEGATIVE ng/mL
Cocaine Metabolite: NEGATIVE ng/mL
Creatinine: 91.5 mg/dL
Ethyl Glucuronide (ETG): 1194 ng/mL
Ethyl Sulfate (ETS): 274 ng/mL
MDMA: NEGATIVE ng/mL
Marijuana Metabolite: NEGATIVE ng/mL
Opiates: NEGATIVE ng/mL
Oxidant: NEGATIVE ug/mL
Oxycodone: NEGATIVE ng/mL
pH: 7.6 (ref 4.5–9.0)

## 2019-07-05 ENCOUNTER — Encounter: Payer: Self-pay | Admitting: Family Medicine

## 2019-07-14 ENCOUNTER — Other Ambulatory Visit: Payer: Self-pay

## 2019-07-14 NOTE — Progress Notes (Signed)
[place] at [facility] [name], [place], [place] [address] [phone]  Date:  07/15/2019   Name:  Dawn Powers   DOB:  03/31/1946   MRN:  [id number]  PCP:  Darreld Mclean, MD    Chief Complaint: Ear Pain (hearing noises, waking up in night, mostly right  but also left)   History of Present Illness:  Dawn Powers is a 73 y.o. very pleasant female patient who presents with the following:  Here today with concern of ear pain and difficulty hearing.  This reminds her of when she had cerumen impaction in the past, she wonders if this is happening again.  Her symptoms are bilateral, right ear is worse We met for virtual visit in late September due to a flareup of chronic back pain  we had her stop HCTZ and put her on plain losartan 50 mg due to possible diuretic side effects  She also came in and got some lab work, comments follow:  Blood counts look fine Metabolic profile looks good.  Your GFR-kidney filtration rate-is minimally decreased this time.  I would suggest checking this again in 3 or 4 months.  Your A1c-average blood sugar the previous 3 months-is just barely in the prediabetes range.  This means you may be at increased risk for developing diabetes later on.  We will monitor this, continue to be physically active and watch your diet.  Cholesterol is overall favorable, although your LDL is slightly high.  If you are interested, a cholesterol medication may help reduce your risk of cardiovascular disease in the future.  If you would like to try this, I am glad to send in a prescription  Overall good news, I would not worry too much about the GFR and A1c numbers.  Please see me in about 4 months and we can follow-up  Tetanus vaccine Flu up-to-date  She notes that her home bp is generally about 120/70 by the third time she checks it -might be higher upon first check She notes fewer side effects since the HCTZ was  stopped -less dry mouth She is taking 1/2 of the losartan 50 or 25 mg-this seems to be working well  She has ear wax build up in the right and left ears today  Patient Active Problem List   Diagnosis Date Noted  . Prediabetes 07/02/2019  . 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 Endometrial cancer ([place]) 09/26/2015  . Essential hypertension 05/23/2015  . Thoracic facet joint syndrome 04/27/2014  . Back pain 03/24/2014  . Chronic pain syndrome 03/24/2014  . Scoliosis (and kyphoscoliosis), idiopathic 12/22/2012  . Osteopenia 12/22/2012  . Other and unspecified hyperlipidemia 12/22/2012    Past Medical History:  Diagnosis Date  . Arthritis   . Cancer [facility])    Endometrial   . Heart murmur   . Hyperlipidemia   . Hypertension   . Neuromuscular disorder ([place])    DDD  . Pain    lower back -hx "DDD"  . Radiation 11/15/15, 11/23/15, 11/30/15, 12/07/15, 12/14/15   HDR brachytherapy    Past Surgical History:  Procedure Laterality Date  . ABDOMINAL HYSTERECTOMY    . COLONOSCOPY    . FRACTURE SURGERY Right    ankle  . GANGLION CYST EXCISION    . LYMPH NODE BIOPSY N/A 09/26/2015   Procedure: SENTINEL LYMPH NODE BIOPSY;  Surgeon: Everitt Amber, MD;  Location: [facility];  Service: Gynecology;  Laterality: N/A;  .  ROBOTIC ASSISTED TOTAL HYSTERECTOMY WITH BILATERAL SALPINGO OOPHERECTOMY Bilateral 09/26/2015   Procedure: ROBOTIC ASSISTED TOTAL HYSTERECTOMY WITH BILATERAL SALPINGO OOPHORECTOMY;  Surgeon: Everitt Amber, MD;  Location: [facility];  Service: Gynecology;  Laterality: Bilateral;  . TOTAL SHOULDER ARTHROPLASTY Left 09/10/2018   Procedure: TOTAL SHOULDER ARTHROPLASTY;  Surgeon: Justice Britain, MD;  Location: [place] [facility];  Service: Orthopedics;  Laterality: Left;  121mn  . WISDOM TOOTH EXTRACTION      Social History   Tobacco Use  . Smoking status: Former Smoker    Years: 4.00    Types: Cigarettes    Quit date: 09/30/1998    Years since quitting: 20.8  . Smokeless tobacco: Never Used   Substance Use Topics  . Alcohol use: Yes    Alcohol/week: 3.0 standard drinks    Types: 3 Standard drinks or equivalent per week    Comment: WINE OR BOURBON - weekly  . Drug use: No    Family History  Problem Relation Age of Onset  . Heart disease Mother   . Alcohol abuse Mother        breast cancer - questionable   . Dementia Mother   . Alcohol abuse Father   . Prostate cancer Father 633      prostate and colon- unsure of primary   . Kidney cancer Brother   . Alcohol abuse Maternal Grandfather   . Alcohol abuse Sister   . Esophageal cancer Neg Hx   . Stomach cancer Neg Hx   . Rectal cancer Neg Hx     Allergies  Allergen Reactions  . Morphine And Related Hives    Medication list has been reviewed and updated.  Current Outpatient Medications on File Prior to Visit  Medication Sig Dispense Refill  . cyclobenzaprine (FLEXERIL) 10 MG tablet Take 1 tablet (10 mg total) by mouth 3 (three) times daily as needed for muscle spasms. 30 tablet 1  . HYDROcodone-acetaminophen (NORCO/VICODIN) 5-325 MG tablet TAKE 1 OR 2 TABLETS EVERY 6 HOURS AS NEEDED FOR PAIN. 90 tablet 0  . losartan (COZAAR) 50 MG tablet Take 1 tablet (50 mg total) by mouth daily. 30 tablet 6  . magnesium oxide (MAG-OX) 400 MG tablet Take 400 mg by mouth as needed.    . neomycin-polymyxin-hydrocortisone (CORTISPORIN) OTIC solution 1 drop to ear 3 times daily 10 mL 0  . ondansetron (ZOFRAN) 4 MG tablet Take 1 tablet (4 mg total) by mouth every 6 (six) hours as needed for nausea. 20 tablet 0  . Propylene Glycol (SYSTANE BALANCE OP) Place 1 drop into both eyes daily as needed (dry eyes).     . traZODone (DESYREL) 50 MG tablet Take 1-1.5 tablets (50-75 mg total) by mouth at bedtime as needed for sleep. TAKE 1-1&1/2 TABLETS AT BEDTIME AS NEEDED FOR SLEEP. 45 tablet 3   No current facility-administered medications on file prior to visit.     Review of Systems:  As per HPI- otherwise negative.   Physical  Examination: Vitals:   07/15/19 1323  BP: (!) 144/80  Pulse: 82  Resp: 16  Temp: 97.8 F (36.6 C)  SpO2: 99%   Vitals:   07/15/19 1323  Weight: 126 lb (57.2 kg)  Height: 5' 3.5" (1.613 m)   Body mass index is 21.97 kg/m. Ideal Body Weight: Weight in (lb) to have BMI = 25: 143.1  GEN: WDWN, NAD, Non-toxic, A & O x 3, petite build, looks well  HEENT: Atraumatic, Normocephalic. Neck supple. No masses, No LAD.  She has  ear wax occluding both of her ear canals.  She also has narrow ear canals Ears and Nose: No external deformity. CV: RRR, No M/G/R. No JVD. No thrill. No extra heart sounds. PULM: CTA B, no wheezes, crackles, rhonchi. No retractions. No resp. distress. No accessory muscle use. EXTR: No c/c/e NEURO Normal gait.  PSYCH: Normally interactive. Conversant. Not depressed or anxious appearing.  Calm demeanor.   Verbal consent obtained.  Both ears were irrigated with warm water, an ear curette was used to remove cerumen and dead skin.  By the end of treatment patient felt well, was able to hear better.  Both TMs were visible and normal, ear canals normal Assessment and Plan: Bilateral impacted cerumen  Here today with bilateral ear wax impaction.  Ears were irrigated, and ear wax was removed.  Patient felt well, hearing improved.  Left clinic in good condition  Signed Lamar Blinks, MD

## 2019-07-15 ENCOUNTER — Encounter: Payer: Self-pay | Admitting: Family Medicine

## 2019-07-15 ENCOUNTER — Other Ambulatory Visit: Payer: Self-pay

## 2019-07-15 ENCOUNTER — Ambulatory Visit (INDEPENDENT_AMBULATORY_CARE_PROVIDER_SITE_OTHER): Payer: Medicare Other | Admitting: Family Medicine

## 2019-07-15 VITALS — BP 144/80 | HR 82 | Temp 97.8°F | Resp 16 | Ht 63.5 in | Wt 126.0 lb

## 2019-07-15 DIAGNOSIS — H6123 Impacted cerumen, bilateral: Secondary | ICD-10-CM

## 2019-08-18 ENCOUNTER — Ambulatory Visit: Payer: Self-pay

## 2019-08-18 ENCOUNTER — Telehealth: Payer: Self-pay

## 2019-08-18 NOTE — Telephone Encounter (Signed)
Scheduled patient tomorrow at 10:20

## 2019-08-18 NOTE — Telephone Encounter (Signed)
Ms Danial called stating when she wipes her rectum there is small amount of blood on the paper.  She was to see Dr. Harrington Challenger in November.  I made a follow up appt for her.  Per Joylene John, NP I directed her to make an appointment with her PCP or GI doctor for the rectal blood.  Pt verbalized understanding.

## 2019-08-18 NOTE — Telephone Encounter (Signed)
Patient called stating that she has rectal bleeding for first time.  She is concerned because she has had cancer and radiation therapy. Not rectal cancer.  She states that the bleeding is only when moving her bowels.  She states there is a discharge of pink mucus and also she sees pink when she wipes.  She describes the consistency of her BMs as hard balls. She does not report feeling constipated. She takes hydrocodone on occasion.  She is concerned because of her hx of Cancer. This pink discharge with BM has been happening for about two to three weeks.  Care advice was read to patient.  She verbalized understanding. Call was attempted 3 times to office with no answer. Note will be routed for scheduling and contacting patient.  Reason for Disposition . [1] Rectal bleeding is minimal (e.g., blood just on toilet paper, few drops, streaks on surface of normal formed BM) AND [2] bleeding recurs 3 or more times on treatment  Answer Assessment - Initial Assessment Questions 1. APPEARANCE of BLOOD: "What color is it?" "Is it passed separately, on the surface of the stool, or mixed in with the stool?"      Pink mucus, pink ting on paper 2. AMOUNT: "How much blood was passed?"      Smear very infrequent 3. FREQUENCY: "How many times has blood been passed with the stools?"      Only with passing stool 4. ONSET: "When was the blood first seen in the stools?" (Days or weeks)      2 weeks ago 5. DIARRHEA: "Is there also some diarrhea?" If so, ask: "How many diarrhea stools were passed in past 24 hours?"      no 6. CONSTIPATION: "Do you have constipation?" If so, "How bad is it?"     Constipation  Just round balls 7. RECURRENT SYMPTOMS: "Have you had blood in your stools before?" If so, ask: "When was the last time?" and "What happened that time?"      no 8. BLOOD THINNERS: "Do you take any blood thinners?" (e.g., Coumadin/warfarin, Pradaxa/dabigatran, aspirin)     no 9. OTHER SYMPTOMS: "Do you have any  other symptoms?"  (e.g., abdominal pain, vomiting, dizziness, fever)    no 10. PREGNANCY: "Is there any chance you are pregnant?" "When was your last menstrual period?"       N/A  Protocols used: RECTAL BLEEDING-A-AH

## 2019-08-19 ENCOUNTER — Other Ambulatory Visit: Payer: Self-pay

## 2019-08-19 ENCOUNTER — Encounter: Payer: Self-pay | Admitting: Family Medicine

## 2019-08-19 ENCOUNTER — Ambulatory Visit (INDEPENDENT_AMBULATORY_CARE_PROVIDER_SITE_OTHER): Payer: Medicare Other | Admitting: Family Medicine

## 2019-08-19 VITALS — BP 142/76 | HR 88 | Temp 96.7°F | Resp 16 | Ht 63.5 in | Wt 127.0 lb

## 2019-08-19 DIAGNOSIS — K625 Hemorrhage of anus and rectum: Secondary | ICD-10-CM | POA: Diagnosis not present

## 2019-08-19 DIAGNOSIS — G894 Chronic pain syndrome: Secondary | ICD-10-CM | POA: Diagnosis not present

## 2019-08-19 LAB — CBC
HCT: 37.8 % (ref 36.0–46.0)
Hemoglobin: 12.9 g/dL (ref 12.0–15.0)
MCHC: 34.1 g/dL (ref 30.0–36.0)
MCV: 97.4 fl (ref 78.0–100.0)
Platelets: 214 10*3/uL (ref 150.0–400.0)
RBC: 3.88 Mil/uL (ref 3.87–5.11)
RDW: 13.1 % (ref 11.5–15.5)
WBC: 5.2 10*3/uL (ref 4.0–10.5)

## 2019-08-19 MED ORDER — HYDROCODONE-ACETAMINOPHEN 5-325 MG PO TABS: ORAL_TABLET | ORAL | 0 refills | Status: DC

## 2019-08-19 NOTE — Patient Instructions (Signed)
Great to see you again today!   I will be in touch with your blood counts asap  I will contact Dr Carlean Purl for you and let you know if he recommends an office visit or perhaps Cologuard   Will request your most recent mammogram report from Dr Willis Modena

## 2019-08-19 NOTE — Progress Notes (Addendum)
[place] at [facility] [place], [place], [place] [address] [phone]  Date:  08/19/2019   Name:  Dawn Powers   DOB:  May 23, 1946   MRN:  [id number]  PCP:  Darreld Mclean, MD    Chief Complaint: Rectal Bleeding (hemorrhoid, hx of cancer)   History of Present Illness:  Dawn Powers is a 73 y.o. very pleasant female patient who presents with the following:  Pt with history of pre-diabetes, HTN, endometrial cancer s/p hyst 2016, chronic back pain Here today with concern of rectal bleeding Over the last 3 weeks after wiping she has noted pink on the TP She thinks this might have to do with a small external hemorrhoid which can be irritated at time She passed a bit of mucus a couple of times, and she was not sure if brown or pink No bright red blood in the bowl or mixed in stool She sees Dr Denman George on a regular basis, next visit is in January  She has notes some smaller stools recently she thinks due to not drinking a lot of fluids  No abd pain Stool is not small or changed in caliper  Wt Readings from Last 3 Encounters:  08/19/19 127 lb (57.6 kg)  07/15/19 126 lb (57.2 kg)  03/15/19 128 lb 9.6 oz (58.3 kg)   Flu shot UTD Tetanus booster Colon 2016- Dr Carlean Purl, looked fine  dexa scan?- she is not interested as she will not take medication for same  mammo- last year per pt report at GYN office, Dr Willis Modena- will request report  Her shingles series is complete as of yesterday Offered tetanus booster, she will delay for now   She does use hydrocodone periodically for chronic back pain, she is not out of her pills but requests a refill today UDS completed last month  04/27/2019  1   04/27/2019  Hydrocodone-Acetamin 5-325 MG  90.00  12 Je Cop   [id number]   Gat (0700)   0  37.50 MME  Comm Ins   [place]  10/14/2018  1   10/14/2018  Hydrocodone-Acetamin 5-325 MG  90.00  12 Je Cop   [id number]   Gat (0700)   0  37.50 MME  Comm  Ins   [place]  09/11/2018  1   09/11/2018  Oxycodone-Acetaminophen 5-325  30.00  5 Tr Shu   [id number]   Gat (0700)   0  45.00 MME  Comm Ins   [place]  03/25/2018  1   03/11/2018  Hydrocodone-Acetamin 5-325 MG  90.00  12 Je Cop   [id number]   Gat (0700)   0  37.50 MME  Comm Ins   [place]  11/27/2017  1   11/27/2017  Hydrocodone-Acetamin 5-325 MG  90.00  12 Je Cop   [id number]   Gat (0700)   0  37.50 MME  Comm Ins   [place]  09/15/2017  1   09/15/2017  Hydrocodone-Acetamin 5-325 MG  90.00  15 Je Cop   [id number]   Gat (0700)   0  30.00 MME        Patient Active Problem List   Diagnosis Date Noted  . Prediabetes 07/02/2019  . 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 Endometrial cancer ([place]) 09/26/2015  . Essential hypertension 05/23/2015  . Thoracic facet joint syndrome 04/27/2014  . Back pain 03/24/2014  . Chronic pain syndrome 03/24/2014  . Scoliosis (and kyphoscoliosis), idiopathic 12/22/2012  .  Osteopenia 12/22/2012  . Other and unspecified hyperlipidemia 12/22/2012    Past Medical History:  Diagnosis Date  . Arthritis   . Cancer [facility])    Endometrial   . Heart murmur   . Hyperlipidemia   . Hypertension   . Neuromuscular disorder ([place])    DDD  . Pain    lower back -hx "DDD"  . Radiation 11/15/15, 11/23/15, 11/30/15, 12/07/15, 12/14/15   HDR brachytherapy    Past Surgical History:  Procedure Laterality Date  . ABDOMINAL HYSTERECTOMY    . COLONOSCOPY    . FRACTURE SURGERY Right    ankle  . GANGLION CYST EXCISION    . LYMPH NODE BIOPSY N/A 09/26/2015   Procedure: SENTINEL LYMPH NODE BIOPSY;  Surgeon: Everitt Amber, MD;  [facility];  Service: Gynecology;  Laterality: N/A;  . ROBOTIC ASSISTED TOTAL HYSTERECTOMY WITH BILATERAL SALPINGO OOPHERECTOMY Bilateral 09/26/2015   Procedure: ROBOTIC ASSISTED TOTAL HYSTERECTOMY WITH BILATERAL SALPINGO OOPHORECTOMY;  Surgeon: Everitt Amber, MD;  [facility];  Service: Gynecology;  Laterality: Bilateral;  . TOTAL SHOULDER ARTHROPLASTY Left 09/10/2018    Procedure: TOTAL SHOULDER ARTHROPLASTY;  Surgeon: Justice Britain, MD;  Location: [facility];  Service: Orthopedics;  Laterality: Left;  134min  . WISDOM TOOTH EXTRACTION      Social History   Tobacco Use  . Smoking status: Former Smoker    Years: 4.00    Types: Cigarettes    Quit date: 09/30/1998    Years since quitting: 20.8  . Smokeless tobacco: Never Used  Substance Use Topics  . Alcohol use: Yes    Alcohol/week: 3.0 standard drinks    Types: 3 Standard drinks or equivalent per week    Comment: WINE OR BOURBON - weekly  . Drug use: No    Family History  Problem Relation Age of Onset  . Heart disease Mother   . Alcohol abuse Mother        breast cancer - questionable   . Dementia Mother   . Alcohol abuse Father   . Prostate cancer Father 68       prostate and colon- unsure of primary   . Kidney cancer Brother   . Alcohol abuse Maternal Grandfather   . Alcohol abuse Sister   . Esophageal cancer Neg Hx   . Stomach cancer Neg Hx   . Rectal cancer Neg Hx     Allergies  Allergen Reactions  . Morphine And Related Hives    Medication list has been reviewed and updated.  Current Outpatient Medications on File Prior to Visit  Medication Sig Dispense Refill  . cyclobenzaprine (FLEXERIL) 10 MG tablet Take 1 tablet (10 mg total) by mouth 3 (three) times daily as needed for muscle spasms. 30 tablet 1  . losartan (COZAAR) 50 MG tablet Take 1 tablet (50 mg total) by mouth daily. 30 tablet 6  . magnesium oxide (MAG-OX) 400 MG tablet Take 400 mg by mouth as needed.    . neomycin-polymyxin-hydrocortisone (CORTISPORIN) OTIC solution 1 drop to ear 3 times daily 10 mL 0  . ondansetron (ZOFRAN) 4 MG tablet Take 1 tablet (4 mg total) by mouth every 6 (six) hours as needed for nausea. 20 tablet 0  . Propylene Glycol (SYSTANE BALANCE OP) Place 1 drop into both eyes daily as needed (dry eyes).     . traZODone (DESYREL) 50 MG tablet Take 1-1.5 tablets (50-75 mg total) by mouth at bedtime as  needed for sleep. TAKE 1-1&1/2 TABLETS AT BEDTIME AS NEEDED FOR SLEEP.  45 tablet 3   No current facility-administered medications on file prior to visit.     Review of Systems:  As per HPI- otherwise negative. No fever chills  Physical Examination: Vitals:   08/19/19 1013  BP: (!) 142/76  Pulse: 88  Resp: 16  Temp: (!) 96.7 F (35.9 C)  SpO2: 98%   Vitals:   08/19/19 1013  Weight: 127 lb (57.6 kg)  Height: 5' 3.5" (1.613 m)   Body mass index is 22.14 kg/m. Ideal Body Weight: Weight in (lb) to have BMI = 25: 143.1  GEN: WDWN, NAD, Non-toxic, A & O x 3 HEENT: Atraumatic, Normocephalic. Neck supple. No masses, No LAD. Ears and Nose: No external deformity. CV: RRR, No M/G/R. No JVD. No thrill. No extra heart sounds. PULM: CTA B, no wheezes, crackles, rhonchi. No retractions. No resp. distress. No accessory muscle use. ABD: S, NT, ND, +BS. No rebound. No HSM.  Benign soft belly EXTR: No c/c/e NEURO Normal gait.  PSYCH: Normally interactive. Conversant. Not depressed or anxious appearing.  Calm demeanor.  Rectal exam: She is a small external hemorrhoid at about 4:00.  Otherwise exam is normal, no evidence of a fissure, no gross bleeding is present   Assessment and Plan: Rectal bleeding - Plan: CBC  Chronic pain syndrome - Plan: HYDROcodone-acetaminophen (NORCO/VICODIN) 5-325 MG tablet  Chirstina is here today with concern of trace rectal bleeding.  Her exam today is normal. I will check a blood count to rule out any drop in hemoglobin She did have a routine colonoscopy about 4 years ago.  I will touch base with her gastroenterologist and ask his advice I suggested that she try a stool softener in case hard stools are playing a role  Refilled hydrocodone for chronic pain syndrome Signed Lamar Blinks, MD  Received a message back from Dr Carlean Purl  I see Hgb NL.  I would avoid Cologard as could be + from the anorectal bleeding - same for a hemoccult. Want to only use  those tests when no visible bleeding if concerned about colon cancer.  Bleeding often worries people and I am happy to see her and check for hemorrhoids etc if she wants. That will have to wait until January but if she wants to be seen can do so.  I would not anticipate needing a colonoscopy either, based upon what we are seeing.  If she is ok with seeing an APP could possibly be seen sooner.  Best to you, Frederico Hamman and Gershon Mussel

## 2019-08-21 ENCOUNTER — Encounter: Payer: Self-pay | Admitting: Family Medicine

## 2019-09-06 ENCOUNTER — Encounter: Payer: Self-pay | Admitting: Family Medicine

## 2019-09-28 ENCOUNTER — Encounter: Payer: Self-pay | Admitting: Family Medicine

## 2019-10-11 ENCOUNTER — Encounter: Payer: Self-pay | Admitting: Family Medicine

## 2019-10-11 ENCOUNTER — Ambulatory Visit: Payer: Medicare Other | Attending: Internal Medicine

## 2019-10-11 DIAGNOSIS — Z23 Encounter for immunization: Secondary | ICD-10-CM | POA: Insufficient documentation

## 2019-10-11 NOTE — Progress Notes (Signed)
   Covid-19 Vaccination Clinic  Name:  Dawn Powers    MRN: [id number] DOB: 05/03/1946  10/11/2019  Ms. Velasques was observed post Covid-19 immunization for 15 minutes without incidence. She was provided with Vaccine Information Sheet and instruction to access the V-Safe system.   Ms. Degraw was instructed to call 911 with any severe reactions post vaccine: Marland Kitchen Difficulty breathing  . Swelling of your face and throat  . A fast heartbeat  . A bad rash all over your body  . Dizziness and weakness

## 2019-10-13 ENCOUNTER — Encounter: Payer: Self-pay | Admitting: Gynecologic Oncology

## 2019-10-14 ENCOUNTER — Encounter: Payer: Self-pay | Admitting: Gynecologic Oncology

## 2019-10-14 ENCOUNTER — Other Ambulatory Visit: Payer: Self-pay

## 2019-10-14 ENCOUNTER — Inpatient Hospital Stay: Payer: Medicare Other | Attending: Gynecologic Oncology | Admitting: Gynecologic Oncology

## 2019-10-14 VITALS — BP 149/65 | HR 65 | Temp 97.8°F | Resp 18 | Ht 63.5 in | Wt 131.6 lb

## 2019-10-14 DIAGNOSIS — K5900 Constipation, unspecified: Secondary | ICD-10-CM | POA: Diagnosis not present

## 2019-10-14 DIAGNOSIS — E785 Hyperlipidemia, unspecified: Secondary | ICD-10-CM | POA: Insufficient documentation

## 2019-10-14 DIAGNOSIS — I10 Essential (primary) hypertension: Secondary | ICD-10-CM | POA: Diagnosis not present

## 2019-10-14 DIAGNOSIS — Z90710 Acquired absence of both cervix and uterus: Secondary | ICD-10-CM | POA: Diagnosis not present

## 2019-10-14 DIAGNOSIS — M1990 Unspecified osteoarthritis, unspecified site: Secondary | ICD-10-CM | POA: Insufficient documentation

## 2019-10-14 DIAGNOSIS — Z79899 Other long term (current) drug therapy: Secondary | ICD-10-CM | POA: Diagnosis not present

## 2019-10-14 DIAGNOSIS — Z87891 Personal history of nicotine dependence: Secondary | ICD-10-CM | POA: Insufficient documentation

## 2019-10-14 DIAGNOSIS — Z923 Personal history of irradiation: Secondary | ICD-10-CM | POA: Insufficient documentation

## 2019-10-14 DIAGNOSIS — Z90722 Acquired absence of ovaries, bilateral: Secondary | ICD-10-CM | POA: Insufficient documentation

## 2019-10-14 DIAGNOSIS — C541 Malignant neoplasm of endometrium: Secondary | ICD-10-CM | POA: Diagnosis not present

## 2019-10-14 NOTE — Progress Notes (Signed)
Gynecologic Oncology Follow-up  Chief Complaint:  Chief Complaint  Patient presents with  . Endometrial adenocarcinoma ([place])   FOLLOW UP VISIT  Assessment:    74 y.o. 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   S/p robotic hysterectomy, BSO, sentinel lymph node biopsy on 09/26/15 with high intermediate risk factors for recurrence, s/p vaginal brachytherapy adjuvant therapy completed in March, 2017.  No evidence of recurrence on today's exam.  Plan: Discussed symptoms concerning for recurrence and recommendation to return to see [place] immediately should they develop. If her abdominal pains persist or become worse, she will notify us and we will order a CT scan.  Return to clinic in 6 months,.  Counseled regarding metamucil and miralax for constipation.   HPI:  Dawn Powers is a 74 y.o. year old initially seen in consultation on 09/08/15 referred by Dr Meissinger for grade 2 endometrial cancer.  She then underwent a robotic hysterectomy, BSO and bilateral SLN biopsy on 76/16/07 without complications.  Her postoperative course was uncomplicated.  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 MSI stable. She was recommended adjuvant vaginal brachytherapy for high/intermediate risk factors.  She completed vaginal brachytherapy in March, 2017.  She is seen today for a routine surveillance visit. She has no complaints concerning for recurrence. She is using her vaginal dilator but is not sexually active. No bleeding.  Her brothers have been diagnosed with prostate and renal cancer.   She had shoulder replacement surgery in December, 2019.   Interval Hx:  She has a bleeding hemorrhoid and constipation. She has a bruise on her breast.   Past Medical History:  Diagnosis Date  . Arthritis   . Cancer [facility])     Endometrial   . Heart murmur   . Hyperlipidemia   . Hypertension   . Neuromuscular disorder ([place])    DDD  . Pain    lower back -hx "DDD"  . Radiation 11/15/15, 11/23/15, 11/30/15, 12/07/15, 12/14/15   HDR brachytherapy   Past Surgical History:  Procedure Laterality Date  . ABDOMINAL HYSTERECTOMY    . COLONOSCOPY    . FRACTURE SURGERY Right    ankle  . GANGLION CYST EXCISION    . LYMPH NODE BIOPSY N/A 09/26/2015   Procedure: SENTINEL LYMPH NODE BIOPSY;  Surgeon: Everitt Amber, MD;  Location: [facility];  Service: Gynecology;  Laterality: N/A;  . ROBOTIC ASSISTED TOTAL HYSTERECTOMY WITH BILATERAL SALPINGO OOPHERECTOMY Bilateral 09/26/2015   Procedure: ROBOTIC ASSISTED TOTAL HYSTERECTOMY WITH BILATERAL SALPINGO OOPHORECTOMY;  Surgeon: Everitt Amber, MD;  Location: [facility];  Service: Gynecology;  Laterality: Bilateral;  . TOTAL SHOULDER ARTHROPLASTY Left 09/10/2018   Procedure: TOTAL SHOULDER ARTHROPLASTY;  Surgeon: Justice Britain, MD;  Location: [facility];  Service: Orthopedics;  Laterality: Left;  166mn  . WISDOM TOOTH EXTRACTION     Family History  Problem Relation Age of Onset  . Heart disease Mother   . Alcohol abuse Mother        breast cancer - questionable   . Dementia Mother   . Alcohol abuse Father   . Prostate cancer Father [phone]      prostate and colon- unsure of primary   . Kidney cancer Brother   . Alcohol abuse Maternal Grandfather   . Alcohol abuse Sister   . Esophageal cancer Neg Hx   . Stomach cancer Neg Hx   .  Rectal cancer Neg Hx    Social History   Socioeconomic History  . Marital status: Married    Spouse name: Not on file  . Number of children: 2  . Years of education: Not on file  . Highest education level: Not on file  Occupational History  . Not on file  Tobacco Use  . Smoking status: Former Smoker    Years: 4.00    Types: Cigarettes    Quit date: 09/30/1998    Years since quitting: 21.0  . Smokeless tobacco: Never Used  Substance and Sexual Activity  . Alcohol  use: Yes    Alcohol/week: 3.0 standard drinks    Types: 3 Standard drinks or equivalent per week    Comment: WINE OR BOURBON - weekly  . Drug use: No  . Sexual activity: Yes    Partners: Female    Birth control/protection: None  Other Topics Concern  . Not on file  Social History Narrative   Married. Education: college and Other. Exercise 4 times a week for 50 minutes.   Social Determinants of Health   Financial Resource Strain:   . Difficulty of Paying Living Expenses: Not on file  Food Insecurity:   . Worried About Charity fundraiser in the Last Year: Not on file  . Ran Out of Food in the Last Year: Not on file  Transportation Needs:   . Lack of Transportation (Medical): Not on file  . Lack of Transportation (Non-Medical): Not on file  Physical Activity:   . Days of Exercise per Week: Not on file  . Minutes of Exercise per Session: Not on file  Stress:   . Feeling of Stress : Not on file  Social Connections:   . Frequency of Communication with Friends and Family: Not on file  . Frequency of Social Gatherings with Friends and Family: Not on file  . Attends Religious Services: Not on file  . Active Member of Clubs or Organizations: Not on file  . Attends Archivist Meetings: Not on file  . Marital Status: Not on file  Intimate Partner Violence:   . Fear of Current or Ex-Partner: Not on file  . Emotionally Abused: Not on file  . Physically Abused: Not on file  . Sexually Abused: Not on file   Current Outpatient Medications on File Prior to Visit  Medication Sig Dispense Refill  . HYDROcodone-acetaminophen (NORCO/VICODIN) 5-325 MG tablet TAKE 1 OR 2 TABLETS EVERY 6 HOURS AS NEEDED FOR PAIN. 90 tablet 0  . losartan (COZAAR) 50 MG tablet Take 1 tablet (50 mg total) by mouth daily. 30 tablet 6  . magnesium oxide (MAG-OX) 400 MG tablet Take 400 mg by mouth as needed.    Marland Kitchen Propylene Glycol (SYSTANE BALANCE OP) Place 1 drop into both eyes daily as needed (dry eyes).      . traZODone (DESYREL) 50 MG tablet Take 1-1.5 tablets (50-75 mg total) by mouth at bedtime as needed for sleep. TAKE 1-1&1/2 TABLETS AT BEDTIME AS NEEDED FOR SLEEP. 45 tablet 3   No current facility-administered medications on file prior to visit.   Allergies  Allergen Reactions  . Morphine And Related Hives      Review of systems: Constitutional:  She has no weight gain or weight loss. She has no fever or chills. Eyes: No blurred vision Ears, Nose, Mouth, Throat: No dizziness, headaches or changes in hearing. No mouth sores. Cardiovascular: No chest pain, palpitations or edema. Respiratory:  No shortness of breath,  wheezing or cough Gastrointestinal: She has normal bowel movements without diarrhea or constipation. She denies any nausea or vomiting. She denies blood in her stool or heart burn. Occasional pelvic twinges. Genitourinary:  She denies pelvic pain, pelvic pressure or changes in her urinary function. She has no hematuria, dysuria, or incontinence. She has no irregular vaginal bleeding or vaginal discharge Musculoskeletal: Denies muscle weakness or joint pains.  Skin:  She has no skin changes, rashes or itching Neurological: resolved neuropathic back pain. Psychiatric:  She denies depression or anxiety. Hematologic/Lymphatic:   No easy bruising or bleeding   Physical Exam: WD in NAD Neck  Supple NROM, without any enlargements.  Lymph Node Survey No cervical supraclavicular or inguinal adenopathy Cardiovascular  Pulse normal rate, regularity and rhythm. S1 and S2 normal.  Lungs  Clear to auscultation bilateraly, without wheezes/crackles/rhonchi. Good air movement.  Skin  No rash/lesions/breakdown. Echymosis on left breast (fading) Psychiatry  Alert and oriented to person, place, and time  Abdomen  Normoactive bowel sounds, abdomen soft, non-tender and thin without evidence of hernia.  Back No CVA tenderness Genito Urinary  Vaginal cuff smooth, no lesions. No  palpable masses.  Rectal  Good tone, no masses no cul de sac nodularity. External hemorrhoid. Hard stool palpable.  Extremities  No bilateral cyanosis, clubbing or edema.   Thereasa Solo, MD

## 2019-10-14 NOTE — Patient Instructions (Signed)
Please notify Dr Denman George at phone number [phone] if you notice vaginal bleeding, new pelvic or abdominal pains, bloating, feeling full easy, or a change in bladder or bowel function.   Please contact Dr Serita Grit office (at [phone]) in April, 2021 to request an appointment with her for July, 2021.  Try metamucil once a day and miralax once or twice a day for your slow bowel transit and constipation.

## 2019-10-22 ENCOUNTER — Encounter: Payer: Self-pay | Admitting: Family Medicine

## 2019-10-29 ENCOUNTER — Ambulatory Visit: Payer: Medicare Other

## 2019-10-30 ENCOUNTER — Ambulatory Visit: Payer: Medicare Other | Attending: Internal Medicine

## 2019-10-30 DIAGNOSIS — Z23 Encounter for immunization: Secondary | ICD-10-CM | POA: Insufficient documentation

## 2019-10-30 NOTE — Progress Notes (Signed)
   Covid-19 Vaccination Clinic  Name:  Dawn Powers    MRN: [id number] DOB: Feb 11, 1946  10/30/2019  [name] was observed post Covid-19 immunization for 15 minutes without incidence. She was provided with Vaccine Information Sheet and instruction to access the V-Safe system.   [name] was instructed to call 911 with any severe reactions post vaccine: Marland Kitchen Difficulty breathing  . Swelling of your face and throat  . A fast heartbeat  . A bad rash all over your body  . Dizziness and weakness    Immunizations Administered    Name Date Dose VIS Date Route   Pfizer COVID-19 Vaccine 10/30/2019  8:39 AM 0.3 mL 09/10/2019 Intramuscular   Manufacturer: [place]   Lot: [id number]   [place]: [id number]

## 2019-11-01 ENCOUNTER — Encounter: Payer: Self-pay | Admitting: Family Medicine

## 2019-11-03 ENCOUNTER — Ambulatory Visit: Payer: Medicare Other | Admitting: Family Medicine

## 2019-12-20 DIAGNOSIS — L853 Xerosis cutis: Secondary | ICD-10-CM | POA: Diagnosis not present

## 2019-12-20 DIAGNOSIS — D225 Melanocytic nevi of trunk: Secondary | ICD-10-CM | POA: Diagnosis not present

## 2019-12-20 DIAGNOSIS — L814 Other melanin hyperpigmentation: Secondary | ICD-10-CM | POA: Diagnosis not present

## 2019-12-20 DIAGNOSIS — L820 Inflamed seborrheic keratosis: Secondary | ICD-10-CM | POA: Diagnosis not present

## 2019-12-20 DIAGNOSIS — D1801 Hemangioma of skin and subcutaneous tissue: Secondary | ICD-10-CM | POA: Diagnosis not present

## 2019-12-20 DIAGNOSIS — L821 Other seborrheic keratosis: Secondary | ICD-10-CM | POA: Diagnosis not present

## 2019-12-20 DIAGNOSIS — L218 Other seborrheic dermatitis: Secondary | ICD-10-CM | POA: Diagnosis not present

## 2019-12-20 DIAGNOSIS — L308 Other specified dermatitis: Secondary | ICD-10-CM | POA: Diagnosis not present

## 2020-02-07 ENCOUNTER — Encounter: Payer: Self-pay | Admitting: Family Medicine

## 2020-02-07 ENCOUNTER — Telehealth: Payer: Self-pay | Admitting: Family Medicine

## 2020-02-07 NOTE — Telephone Encounter (Signed)
Pt states she would like a referral to an orthdopedic doctor  So they can  refer her to someone to do pain injection.  She states she's been bedridden for several days due to the pain. Hydrocodone is not working for the pain.  Please advise

## 2020-02-09 DIAGNOSIS — G8929 Other chronic pain: Secondary | ICD-10-CM | POA: Diagnosis not present

## 2020-02-09 DIAGNOSIS — M545 Low back pain: Secondary | ICD-10-CM | POA: Diagnosis not present

## 2020-02-11 DIAGNOSIS — M545 Low back pain: Secondary | ICD-10-CM | POA: Diagnosis not present

## 2020-02-15 DIAGNOSIS — H0102B Squamous blepharitis left eye, upper and lower eyelids: Secondary | ICD-10-CM | POA: Diagnosis not present

## 2020-02-15 DIAGNOSIS — H2513 Age-related nuclear cataract, bilateral: Secondary | ICD-10-CM | POA: Diagnosis not present

## 2020-02-15 DIAGNOSIS — H10413 Chronic giant papillary conjunctivitis, bilateral: Secondary | ICD-10-CM | POA: Diagnosis not present

## 2020-02-15 DIAGNOSIS — H18453 Nodular corneal degeneration, bilateral: Secondary | ICD-10-CM | POA: Diagnosis not present

## 2020-02-15 DIAGNOSIS — H0102A Squamous blepharitis right eye, upper and lower eyelids: Secondary | ICD-10-CM | POA: Diagnosis not present

## 2020-02-15 DIAGNOSIS — H04123 Dry eye syndrome of bilateral lacrimal glands: Secondary | ICD-10-CM | POA: Diagnosis not present

## 2020-02-22 DIAGNOSIS — M545 Low back pain: Secondary | ICD-10-CM | POA: Diagnosis not present

## 2020-02-23 ENCOUNTER — Encounter: Payer: Self-pay | Admitting: Family Medicine

## 2020-02-23 ENCOUNTER — Encounter: Payer: Self-pay | Admitting: Gynecologic Oncology

## 2020-02-23 DIAGNOSIS — G894 Chronic pain syndrome: Secondary | ICD-10-CM

## 2020-02-23 MED ORDER — HYDROCODONE-ACETAMINOPHEN 5-325 MG PO TABS: ORAL_TABLET | ORAL | 0 refills | Status: DC

## 2020-03-09 ENCOUNTER — Encounter: Payer: Self-pay | Admitting: Family Medicine

## 2020-03-09 ENCOUNTER — Encounter: Payer: Self-pay | Admitting: Gynecologic Oncology

## 2020-03-15 ENCOUNTER — Telehealth: Payer: Self-pay | Admitting: *Deleted

## 2020-03-15 NOTE — Telephone Encounter (Signed)
Patient called and rescheduled her appt from 7/6 to 7/14

## 2020-03-29 ENCOUNTER — Ambulatory Visit: Payer: Medicare Other | Admitting: Gynecologic Oncology

## 2020-04-04 ENCOUNTER — Ambulatory Visit: Payer: Medicare Other | Admitting: Gynecologic Oncology

## 2020-04-11 DIAGNOSIS — M545 Low back pain: Secondary | ICD-10-CM | POA: Diagnosis not present

## 2020-04-12 ENCOUNTER — Other Ambulatory Visit: Payer: Self-pay

## 2020-04-12 ENCOUNTER — Inpatient Hospital Stay: Payer: Medicare Other | Attending: Gynecologic Oncology | Admitting: Gynecologic Oncology

## 2020-04-12 ENCOUNTER — Encounter: Payer: Self-pay | Admitting: Gynecologic Oncology

## 2020-04-12 VITALS — BP 133/56 | HR 63 | Temp 98.4°F | Resp 16 | Ht 63.5 in | Wt 125.8 lb

## 2020-04-12 DIAGNOSIS — Z90722 Acquired absence of ovaries, bilateral: Secondary | ICD-10-CM | POA: Diagnosis not present

## 2020-04-12 DIAGNOSIS — C541 Malignant neoplasm of endometrium: Secondary | ICD-10-CM | POA: Insufficient documentation

## 2020-04-12 DIAGNOSIS — Z87891 Personal history of nicotine dependence: Secondary | ICD-10-CM | POA: Diagnosis not present

## 2020-04-12 DIAGNOSIS — Z90710 Acquired absence of both cervix and uterus: Secondary | ICD-10-CM | POA: Diagnosis not present

## 2020-04-12 DIAGNOSIS — Z923 Personal history of irradiation: Secondary | ICD-10-CM | POA: Diagnosis not present

## 2020-04-12 NOTE — Patient Instructions (Signed)
Please notify Dr Denman George at phone number [phone] if you notice vaginal bleeding, new pelvic or abdominal pains, bloating, feeling full easy, or a change in bladder or bowel function.   If your left abdominal pains increase or worry you more, please notify Dr Serita Grit office and she will order a CT scan to evaluate the abdomen and pelvis internally.  Please contact Dr Serita Grit office (at [phone]) in October to request an appointment with her for January, 2022.

## 2020-04-12 NOTE — Progress Notes (Signed)
Gynecologic Oncology Follow-up  Chief Complaint:  Chief Complaint  Patient presents with  . Endometrial cancer ([place])   FOLLOW UP VISIT  Assessment:    74 y.o. 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   S/p robotic hysterectomy, BSO, sentinel lymph node biopsy on 09/26/15 with high intermediate risk factors for recurrence, s/p vaginal brachytherapy adjuvant therapy completed in March, 2017.  No evidence of recurrence on today's exam.  LLQ discomfort  Plan: Discussed symptoms concerning for recurrence and recommendation to return to see [place] immediately should they develop. If her abdominal pains persist or become worse, she will notify us and we will order a CT scan. She declined this study when offered today.  Return to clinic in 6 months,.  HPI:  Dawn Powers is a 74 y.o. year old initially seen in consultation on 09/08/15 referred by Dr Meissinger for grade 2 endometrial cancer.  She then underwent a robotic hysterectomy, BSO and bilateral SLN biopsy on 41/63/84 without complications.  Her postoperative course was uncomplicated.  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 MSI stable. She was recommended adjuvant vaginal brachytherapy for high/intermediate risk factors.  She completed vaginal brachytherapy in March, 2017.  She is using her vaginal dilator but is not sexually active. No bleeding.  Her brothers have been diagnosed with prostate and renal cancer.   She had shoulder replacement surgery in December, 2019.   Interval Hx:  She has begun seeing acupuncturist Sigmund Hazel from [facility] and this has resulted in her being able to stop taking oxycodone for her chronic back pain.  She has mild left lower quadrant pains intermittently that are not bothersome.   Past Medical History:   Diagnosis Date  . Arthritis   . Cancer [facility])    Endometrial   . Heart murmur   . Hyperlipidemia   . Hypertension   . Neuromuscular disorder ([place])    DDD  . Pain    lower back -hx "DDD"  . Radiation 11/15/15, 11/23/15, 11/30/15, 12/07/15, 12/14/15   HDR brachytherapy   Past Surgical History:  Procedure Laterality Date  . ABDOMINAL HYSTERECTOMY    . COLONOSCOPY    . FRACTURE SURGERY Right    ankle  . GANGLION CYST EXCISION    . LYMPH NODE BIOPSY N/A 09/26/2015   Procedure: SENTINEL LYMPH NODE BIOPSY;  Surgeon: Everitt Amber, MD;  [facility];  Service: Gynecology;  Laterality: N/A;  . ROBOTIC ASSISTED TOTAL HYSTERECTOMY WITH BILATERAL SALPINGO OOPHERECTOMY Bilateral 09/26/2015   Procedure: ROBOTIC ASSISTED TOTAL HYSTERECTOMY WITH BILATERAL SALPINGO OOPHORECTOMY;  Surgeon: Everitt Amber, MD;  [facility];  Service: Gynecology;  Laterality: Bilateral;  . TOTAL SHOULDER ARTHROPLASTY Left 09/10/2018   Procedure: TOTAL SHOULDER ARTHROPLASTY;  Surgeon: Justice Britain, MD;  Location: [facility];  Service: Orthopedics;  Laterality: Left;  123mn  . WISDOM TOOTH EXTRACTION     Family History  Problem Relation Age of Onset  . Heart disease Mother   . Alcohol abuse Mother        breast cancer - questionable   . Dementia Mother   . Alcohol abuse Father   . Prostate cancer Father [phone]      prostate and colon- unsure of primary   . Kidney cancer Brother   . Alcohol abuse Maternal Grandfather   . Alcohol abuse Sister   .  Esophageal cancer Neg Hx   . Stomach cancer Neg Hx   . Rectal cancer Neg Hx    Social History   Socioeconomic History  . Marital status: Married    Spouse name: Not on file  . Number of children: 2  . Years of education: Not on file  . Highest education level: Not on file  Occupational History  . Not on file  Tobacco Use  . Smoking status: Former Smoker    Years: 4.00    Types: Cigarettes    Quit date: 09/30/1998    Years since quitting: 21.5  . Smokeless tobacco:  Never Used  Vaping Use  . Vaping Use: Never used  Substance and Sexual Activity  . Alcohol use: Yes    Alcohol/week: 3.0 standard drinks    Types: 3 Standard drinks or equivalent per week    Comment: WINE OR BOURBON - weekly  . Drug use: No  . Sexual activity: Yes    Partners: Female    Birth control/protection: None  Other Topics Concern  . Not on file  Social History Narrative   Married. Education: college and Other. Exercise 4 times a week for 50 minutes.   Social Determinants of Health   Financial Resource Strain:   . Difficulty of Paying Living Expenses:   Food Insecurity:   . Worried About Charity fundraiser in the Last Year:   . Arboriculturist in the Last Year:   Transportation Needs:   . Film/video editor (Medical):   Marland Kitchen Lack of Transportation (Non-Medical):   Physical Activity:   . Days of Exercise per Week:   . Minutes of Exercise per Session:   Stress:   . Feeling of Stress :   Social Connections:   . Frequency of Communication with Friends and Family:   . Frequency of Social Gatherings with Friends and Family:   . Attends Religious Services:   . Active Member of Clubs or Organizations:   . Attends Archivist Meetings:   Marland Kitchen Marital Status:   Intimate Partner Violence:   . Fear of Current or Ex-Partner:   . Emotionally Abused:   Marland Kitchen Physically Abused:   . Sexually Abused:    Current Outpatient Medications on File Prior to Visit  Medication Sig Dispense Refill  . HYDROcodone-acetaminophen (NORCO/VICODIN) 5-325 MG tablet TAKE 1 OR 2 TABLETS EVERY 6 HOURS AS NEEDED FOR PAIN. 90 tablet 0  . losartan (COZAAR) 50 MG tablet Take 1 tablet (50 mg total) by mouth daily. (Patient taking differently: Take 50 mg by mouth daily. Pt takes half tablet 25 mg) 30 tablet 6  . Propylene Glycol (SYSTANE BALANCE OP) Place 1 drop into both eyes daily as needed (dry eyes).     . traZODone (DESYREL) 50 MG tablet Take 1-1.5 tablets (50-75 mg total) by mouth at bedtime  as needed for sleep. TAKE 1-1&1/2 TABLETS AT BEDTIME AS NEEDED FOR SLEEP. 45 tablet 3   No current facility-administered medications on file prior to visit.   Allergies  Allergen Reactions  . Morphine And Related Hives      Review of systems: Constitutional:  She has no weight gain or weight loss. She has no fever or chills. Eyes: No blurred vision Ears, Nose, Mouth, Throat: No dizziness, headaches or changes in hearing. No mouth sores. Cardiovascular: No chest pain, palpitations or edema. Respiratory:  No shortness of breath, wheezing or cough Gastrointestinal: She has normal bowel movements without diarrhea or constipation. She denies  any nausea or vomiting. She denies blood in her stool or heart burn. Occasional pelvic twinges. Genitourinary:  She denies pelvic pain, pelvic pressure or changes in her urinary function. She has no hematuria, dysuria, or incontinence. She has no irregular vaginal bleeding or vaginal discharge Musculoskeletal: Denies muscle weakness or joint pains.  Skin:  She has no skin changes, rashes or itching Neurological: resolved neuropathic back pain. Psychiatric:  She denies depression or anxiety. Hematologic/Lymphatic:   No easy bruising or bleeding   Physical Exam: WD in NAD Neck  Supple NROM, without any enlargements.  Lymph Node Survey No cervical supraclavicular or inguinal adenopathy Cardiovascular  Pulse normal rate, regularity and rhythm. S1 and S2 normal.  Lungs  Clear to auscultation bilateraly, without wheezes/crackles/rhonchi. Good air movement.  Skin  No rash/lesions/breakdown. Psychiatry  Alert and oriented to person, place, and time  Abdomen  Normoactive bowel sounds, abdomen soft, non-tender and thin without evidence of hernia.  Back No CVA tenderness Genito Urinary  Vaginal cuff smooth, no lesions. No palpable masses.  Rectal  Good tone, no masses no cul de sac nodularity. Tenderness on deep palpation but no masses.   Extremities  No bilateral cyanosis, clubbing or edema.   Thereasa Solo, MD

## 2020-04-25 DIAGNOSIS — M545 Low back pain: Secondary | ICD-10-CM | POA: Diagnosis not present

## 2020-05-01 ENCOUNTER — Encounter: Payer: Self-pay | Admitting: Family Medicine

## 2020-05-15 ENCOUNTER — Encounter: Payer: Self-pay | Admitting: Family Medicine

## 2020-05-25 DIAGNOSIS — Z23 Encounter for immunization: Secondary | ICD-10-CM | POA: Diagnosis not present

## 2020-07-07 ENCOUNTER — Encounter: Payer: Self-pay | Admitting: Family Medicine

## 2020-07-10 ENCOUNTER — Other Ambulatory Visit: Payer: Self-pay

## 2020-07-10 ENCOUNTER — Ambulatory Visit (INDEPENDENT_AMBULATORY_CARE_PROVIDER_SITE_OTHER): Payer: Medicare Other

## 2020-07-10 DIAGNOSIS — Z23 Encounter for immunization: Secondary | ICD-10-CM | POA: Diagnosis not present

## 2020-07-19 DIAGNOSIS — R7303 Prediabetes: Secondary | ICD-10-CM | POA: Diagnosis not present

## 2020-07-19 DIAGNOSIS — G8929 Other chronic pain: Secondary | ICD-10-CM | POA: Diagnosis not present

## 2020-07-19 DIAGNOSIS — M545 Low back pain, unspecified: Secondary | ICD-10-CM | POA: Diagnosis not present

## 2020-07-21 ENCOUNTER — Other Ambulatory Visit: Payer: Self-pay

## 2020-07-21 ENCOUNTER — Encounter: Payer: Self-pay | Admitting: Medical

## 2020-07-21 ENCOUNTER — Telehealth (INDEPENDENT_AMBULATORY_CARE_PROVIDER_SITE_OTHER): Payer: Medicare Other | Admitting: Medical

## 2020-07-21 VITALS — BP 150/76 | Temp 99.1°F

## 2020-07-21 DIAGNOSIS — J029 Acute pharyngitis, unspecified: Secondary | ICD-10-CM | POA: Diagnosis not present

## 2020-07-21 DIAGNOSIS — J40 Bronchitis, not specified as acute or chronic: Secondary | ICD-10-CM

## 2020-07-21 DIAGNOSIS — R059 Cough, unspecified: Secondary | ICD-10-CM | POA: Diagnosis not present

## 2020-07-21 MED ORDER — AZITHROMYCIN 250 MG PO TABS: ORAL_TABLET | ORAL | 0 refills | Status: DC

## 2020-07-21 MED ORDER — FLUTICASONE PROPIONATE 50 MCG/ACT NA SUSP: 2.0000 | Freq: Every day | NASAL | 1 refills | Status: DC

## 2020-07-21 MED ORDER — BENZONATATE 100 MG PO CAPS: 100.0000 mg | ORAL_CAPSULE | Freq: Three times a day (TID) | ORAL | 0 refills | Status: DC | PRN

## 2020-07-21 NOTE — Progress Notes (Signed)
Subjective:    Patient ID: Dawn Powers, female    DOB: 1946-08-09, 74 y.o.   MRN: [id number]  HPI  Virtual Visit via Video Note  I connected with Dawn Powers on 07/21/20 at  2:20 PM EDT by a video enabled telemedicine application and verified that I am speaking with the correct person using two identifiers.  Location: Patient: home Provider: office     I discussed the limitations of evaluation and management by telemedicine and the availability of in person appointments. The patient expressed understanding and agreed to proceed.  History of Present Illness:  Pt states she has some runny nose, nasal congestion and coughing. Pt symptoms since Sunday. Some chest congestion and productive cough.  Pt states symptoms started after travel by plan. [place] a lot in [place].  Temp 99.1. usually 97.  Pt does not o2 sat%.   Pt has had 2 covid vaccines plus a booster.  She took one covid test negative yesterday.    Observations/Objective: General-no acute distress, pleasant, oriented. Lungs- on inspection lungs appear unlabored. Neck- no tracheal deviation or jvd on inspection. Neuro- gross motor function appears intact.    Assessment and Plan: You have some recent bronchitis and pharyngitis type signs and symptoms after travel.  Covid test was negative.  Do recommend repeating again Covid test on Sunday morning.  Presently recommend starting Flonase nasal spray, azithromycin antibiotic and benzonatate for cough.  Expect progressive improvement of symptoms.  Update me on Covid test results Monday and how you are doing.  Early next week chest x-ray might be helpful if symptoms/signs persist.  Follow-up in 7 days or as needed.  Follow Up Instructions:    I discussed the assessment and treatment plan with the patient. The patient was provided an opportunity to ask questions and all were answered. The patient agreed with the plan and demonstrated an understanding of the  instructions.   The patient was advised to call back or seek an in-person evaluation if the symptoms worsen or if the condition fails to improve as anticipated.  Time spent with patient today was 30  minutes which consisted of chart review, discussing diagnosis, work up, treatment, answering patient questions and documentation.   Mackie Pai, PA-C    Review of Systems  Constitutional: Negative for chills and fatigue.  HENT: Positive for congestion and sore throat. Negative for ear pain, sinus pressure and sneezing.   Respiratory: Positive for cough. Negative for chest tightness, shortness of breath and wheezing.        Productive cough  Cardiovascular: Negative for chest pain and palpitations.  Gastrointestinal: Negative for abdominal pain.  Musculoskeletal: Negative for back pain.  Skin: Negative for rash.  Neurological: Negative for dizziness, speech difficulty and headaches.  Hematological: Negative for adenopathy. Does not bruise/bleed easily.  Psychiatric/Behavioral: Negative for behavioral problems and confusion.    Past Medical History:  Diagnosis Date  . Arthritis   . Cancer [facility])    Endometrial   . Heart murmur   . Hyperlipidemia   . Hypertension   . Neuromuscular disorder ([place])    DDD  . Pain    lower back -hx "DDD"  . Radiation 11/15/15, 11/23/15, 11/30/15, 12/07/15, 12/14/15   HDR brachytherapy     Social History   Socioeconomic History  . Marital status: Married    Spouse name: Not on file  . Number of children: 2  . Years of education: Not on file  . Highest education level: Not on file  Occupational History  . Not on file  Tobacco Use  . Smoking status: Former Smoker    Years: 4.00    Types: Cigarettes    Quit date: 09/30/1998    Years since quitting: 21.8  . Smokeless tobacco: Never Used  Vaping Use  . Vaping Use: Never used  Substance and Sexual Activity  . Alcohol use: Yes    Alcohol/week: 3.0 standard drinks    Types: 3 Standard drinks or  equivalent per week    Comment: WINE OR BOURBON - weekly  . Drug use: No  . Sexual activity: Yes    Partners: Female    Birth control/protection: None  Other Topics Concern  . Not on file  Social History Narrative   Married. Education: college and Other. Exercise 4 times a week for 50 minutes.   Social Determinants of Health   Financial Resource Strain:   . Difficulty of Paying Living Expenses: Not on file  Food Insecurity:   . Worried About Charity fundraiser in the Last Year: Not on file  . Ran Out of Food in the Last Year: Not on file  Transportation Needs:   . Lack of Transportation (Medical): Not on file  . Lack of Transportation (Non-Medical): Not on file  Physical Activity:   . Days of Exercise per Week: Not on file  . Minutes of Exercise per Session: Not on file  Stress:   . Feeling of Stress : Not on file  Social Connections:   . Frequency of Communication with Friends and Family: Not on file  . Frequency of Social Gatherings with Friends and Family: Not on file  . Attends Religious Services: Not on file  . Active Member of Clubs or Organizations: Not on file  . Attends Archivist Meetings: Not on file  . Marital Status: Not on file  Intimate Partner Violence:   . Fear of Current or Ex-Partner: Not on file  . Emotionally Abused: Not on file  . Physically Abused: Not on file  . Sexually Abused: Not on file    Past Surgical History:  Procedure Laterality Date  . ABDOMINAL HYSTERECTOMY    . COLONOSCOPY    . FRACTURE SURGERY Right    ankle  . GANGLION CYST EXCISION    . LYMPH NODE BIOPSY N/A 09/26/2015   Procedure: SENTINEL LYMPH NODE BIOPSY;  Surgeon: Everitt Amber, MD;  Location: [facility];  Service: Gynecology;  Laterality: N/A;  . ROBOTIC ASSISTED TOTAL HYSTERECTOMY WITH BILATERAL SALPINGO OOPHERECTOMY Bilateral 09/26/2015   Procedure: ROBOTIC ASSISTED TOTAL HYSTERECTOMY WITH BILATERAL SALPINGO OOPHORECTOMY;  Surgeon: Everitt Amber, MD;  Location: [facility];   Service: Gynecology;  Laterality: Bilateral;  . TOTAL SHOULDER ARTHROPLASTY Left 09/10/2018   Procedure: TOTAL SHOULDER ARTHROPLASTY;  Surgeon: Justice Britain, MD;  Location: [facility];  Service: Orthopedics;  Laterality: Left;  181min  . WISDOM TOOTH EXTRACTION      Family History  Problem Relation Age of Onset  . Heart disease Mother   . Alcohol abuse Mother        breast cancer - questionable   . Dementia Mother   . Alcohol abuse Father   . Prostate cancer Father 45       prostate and colon- unsure of primary   . Kidney cancer Brother   . Alcohol abuse Maternal Grandfather   . Alcohol abuse Sister   . Esophageal cancer Neg Hx   . Stomach cancer Neg Hx   . Rectal cancer Neg Hx  Allergies  Allergen Reactions  . Morphine And Related Hives    Current Outpatient Medications on File Prior to Visit  Medication Sig Dispense Refill  . HYDROcodone-acetaminophen (NORCO/VICODIN) 5-325 MG tablet TAKE 1 OR 2 TABLETS EVERY 6 HOURS AS NEEDED FOR PAIN. 90 tablet 0  . losartan (COZAAR) 50 MG tablet Take 1 tablet (50 mg total) by mouth daily. 30 tablet 6  . Propylene Glycol (SYSTANE BALANCE OP) Place 1 drop into both eyes daily as needed (dry eyes).  (Patient not taking: Reported on 07/21/2020)    . traZODone (DESYREL) 50 MG tablet Take 1-1.5 tablets (50-75 mg total) by mouth at bedtime as needed for sleep. TAKE 1-1&1/2 TABLETS AT BEDTIME AS NEEDED FOR SLEEP. (Patient not taking: Reported on 07/21/2020) 45 tablet 3   No current facility-administered medications on file prior to visit.    Temp 99.1 F (37.3 C) (Oral)       Objective:   Physical Exam        Assessment & Plan:

## 2020-07-21 NOTE — Patient Instructions (Addendum)
You have some recent bronchitis and pharyngitis type signs and symptoms after travel.  Covid test was negative.  Do recommend repeating again Covid test on Sunday morning.  Presently recommend starting Flonase nasal spray, azithromycin antibiotic and benzonatate for cough.  Expect progressive improvement of symptoms.  Update me on Covid test results Monday and how you are doing.  Early next week chest x-ray might be helpful if symptoms/signs persist.  Follow-up in 7 days or as needed.

## 2020-07-24 ENCOUNTER — Encounter: Payer: Self-pay | Admitting: Medical

## 2020-07-24 ENCOUNTER — Telehealth: Payer: Self-pay | Admitting: Medical

## 2020-07-24 DIAGNOSIS — R059 Cough, unspecified: Secondary | ICD-10-CM

## 2020-07-24 NOTE — Telephone Encounter (Signed)
Future chest xray placed. 

## 2020-07-25 NOTE — Progress Notes (Addendum)
[place] [facility] [place], [place],  [address] [phone]  Date:  07/31/2020   Name:  Dawn Powers   DOB:  [birth year]   MRN:  [id number]  PCP:  Darreld Mclean, MD    Chief Complaint: Hypertension   History of Present Illness:  Dawn Powers is a 74 y.o. very pleasant female patient who presents with the following:  Patient here today for routine follow-up visit-history of pre-diabetes, HTN, endometrial cancer s/p hyst 2016, chronic back pain She has started acupuncture and this has really helped with her back pain She has been able to cut down on her pain medication use -in fact she went about 3 months without using any at all We last filled hydrocodone #90 back in May of this year- does need a refill today  She uses medication on rather rare occasion for back spasm and pain-generally fills rx only 2-3 times a year   Last seen by myself November 2020 She follows up regularly with her gynecologic oncologist, Dr. Cristopher Peru recent visit in July She is stable in this regard and visits every 6 months for now.  She will be 5 years out from cancer diagnosis soon   Tetanus vaccine- declines for now.  She will get a booster of any significant or dirty injury Mammogram- will order  DEXA scan can be updated- pt does not wish to use any medication for osteoporosis.  Therefore, she declines DEXA scan at this time.  She does try to stay active and gets plenty of weightbearing exercise COVID-19 complete Flu vaccine done Most recent labs about 1 year ago-can update today Patient Active Problem List   Diagnosis Date Noted  . Prediabetes 07/02/2019  . 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 Endometrial cancer ([place]) 09/26/2015  . Essential hypertension 05/23/2015  . Thoracic facet joint syndrome 04/27/2014  . Back pain 03/24/2014  . Chronic pain syndrome 03/24/2014  . Scoliosis (and kyphoscoliosis), idiopathic  12/22/2012  . Osteopenia 12/22/2012  . Other and unspecified hyperlipidemia 12/22/2012    Past Medical History:  Diagnosis Date  . Arthritis   . Cancer [facility])    Endometrial   . Heart murmur   . Hyperlipidemia   . Hypertension   . Neuromuscular disorder ([place])    DDD  . Pain    lower back -hx "DDD"  . Radiation 11/15/15, 11/23/15, 11/30/15, 12/07/15, 12/14/15   HDR brachytherapy    Past Surgical History:  Procedure Laterality Date  . ABDOMINAL HYSTERECTOMY    . COLONOSCOPY    . FRACTURE SURGERY Right    ankle  . GANGLION CYST EXCISION    . LYMPH NODE BIOPSY N/A 09/26/2015   Procedure: SENTINEL LYMPH NODE BIOPSY;  Surgeon: Everitt Amber, MD;  [facility];  Service: Gynecology;  Laterality: N/A;  . ROBOTIC ASSISTED TOTAL HYSTERECTOMY WITH BILATERAL SALPINGO OOPHERECTOMY Bilateral 09/26/2015   Procedure: ROBOTIC ASSISTED TOTAL HYSTERECTOMY WITH BILATERAL SALPINGO OOPHORECTOMY;  Surgeon: Everitt Amber, MD;  [facility];  Service: Gynecology;  Laterality: Bilateral;  . TOTAL SHOULDER ARTHROPLASTY Left 09/10/2018   Procedure: TOTAL SHOULDER ARTHROPLASTY;  Surgeon: Justice Britain, MD;  Location: [place];  Service: Orthopedics;  Laterality: Left;  144min  . WISDOM TOOTH EXTRACTION      Social History   Tobacco Use  . Smoking status: Former Smoker    Years: 4.00    Types: Cigarettes    Quit date: 09/30/1998  Years since quitting: 21.8  . Smokeless tobacco: Never Used  Vaping Use  . Vaping Use: Never used  Substance Use Topics  . Alcohol use: Yes    Alcohol/week: 3.0 standard drinks    Types: 3 Standard drinks or equivalent per week    Comment: WINE OR BOURBON - weekly  . Drug use: No    Family History  Problem Relation Age of Onset  . Heart disease Mother   . Alcohol abuse Mother        breast cancer - questionable   . Dementia Mother   . Alcohol abuse Father   . Prostate cancer Father 74       prostate and colon- unsure of primary   . Kidney cancer Brother   .  Alcohol abuse Maternal Grandfather   . Alcohol abuse Sister   . Esophageal cancer Neg Hx   . Stomach cancer Neg Hx   . Rectal cancer Neg Hx     Allergies  Allergen Reactions  . Morphine And Related Hives    Medication list has been reviewed and updated.  Current Outpatient Medications on File Prior to Visit  Medication Sig Dispense Refill  . fluticasone (FLONASE) 50 MCG/ACT nasal spray Place 2 sprays into both nostrils daily. 16 g 1  . HYDROcodone-acetaminophen (NORCO/VICODIN) 5-325 MG tablet TAKE 1 OR 2 TABLETS EVERY 6 HOURS AS NEEDED FOR PAIN. 90 tablet 0  . losartan (COZAAR) 50 MG tablet Take 1 tablet (50 mg total) by mouth daily. (Patient taking differently: Take 25 mg by mouth daily. ) 30 tablet 6  . traZODone (DESYREL) 50 MG tablet Take 1-1.5 tablets (50-75 mg total) by mouth at bedtime as needed for sleep. TAKE 1-1&1/2 TABLETS AT BEDTIME AS NEEDED FOR SLEEP. 45 tablet 3   No current facility-administered medications on file prior to visit.    Review of Systems:  As per HPI- otherwise negative.   Physical Examination: Vitals:   07/31/20 1004  BP: 126/82  Pulse: 69  Resp: 15  SpO2: 98%   Vitals:   07/31/20 1004  Weight: 123 lb 6.4 oz (56 kg)  Height: 5' 3.5" (1.613 m)   Body mass index is 21.52 kg/m. Ideal Body Weight: Weight in (lb) to have BMI = 25: 143.1  GEN: no acute distress.  Normal weight, looks well HEENT: Atraumatic, Normocephalic.   Bilateral TM wnl, oropharynx normal.  PEERL,EOMI. patient has noticed some postnasal drainage symptoms recently Ears and Nose: No external deformity. CV: RRR, No M/G/R. No JVD. No thrill. No extra heart sounds. PULM: CTA B, no wheezes, crackles, rhonchi. No retractions. No resp. distress. No accessory muscle use. ABD: S, NT, ND, +BS. No rebound. No HSM. EXTR: No c/c/e PSYCH: Normally interactive. Conversant.    Assessment and Plan: Essential hypertension - Plan: CBC, Comprehensive metabolic panel  Dyslipidemia -  Plan: Lipid panel  Prediabetes - Plan: Hemoglobin A1c  Encounter for screening mammogram for malignant neoplasm of breast - Plan: MM 3D SCREEN BREAST BILATERAL  Chronic pain syndrome - Plan: HYDROcodone-acetaminophen (NORCO/VICODIN) 5-325 MG tablet, DRUG MONITORING, PANEL 8 WITH CONFIRMATION, URINE  PND (post-nasal drip) - Plan: ipratropium (ATROVENT) 0.03 % nasal spray    Here today for follow-up visit Blood pressure under very good control Labs pending as above We will have her try Atrovent nasal as needed for postnasal drip symptoms Ordered mammogram Refill hydrocodone, annual UDS today Will plan further follow- up pending labs.  This visit occurred during the SARS-CoV-2 public health emergency.  Safety  protocols were in place, including screening questions prior to the visit, additional usage of staff PPE, and extensive cleaning of exam room while observing appropriate contact time as indicated for disinfecting solutions.    Signed Lamar Blinks, MD   Received her labs 11/2, message to patient Results for orders placed or performed in visit on 07/31/20  CBC  Result Value Ref Range   WBC 7.5 3.8 - 10.8 Thousand/uL   RBC 4.34 3.80 - 5.10 Million/uL   Hemoglobin 14.1 11.7 - 15.5 g/dL   HCT 41.8 35 - 45 %   MCV 96.3 80.0 - 100.0 fL   MCH 32.5 27.0 - 33.0 pg   MCHC 33.7 32.0 - 36.0 g/dL   RDW 12.2 11.0 - 15.0 %   Platelets 288 140 - 400 Thousand/uL   MPV 10.2 7.5 - 12.5 fL  Comprehensive metabolic panel  Result Value Ref Range   Glucose, Bld 95 65 - 99 mg/dL   BUN 14 7 - 25 mg/dL   Creat 0.97 (H) 0.60 - 0.93 mg/dL   BUN/Creatinine Ratio 14 6 - 22 (calc)   Sodium 140 135 - 146 mmol/L   Potassium 4.4 3.5 - 5.3 mmol/L   Chloride 101 98 - 110 mmol/L   CO2 29 20 - 32 mmol/L   Calcium 10.0 8.6 - 10.4 mg/dL   Total Protein 7.3 6.1 - 8.1 g/dL   Albumin 4.7 3.6 - 5.1 g/dL   Globulin 2.6 1.9 - 3.7 g/dL (calc)   AG Ratio 1.8 1.0 - 2.5 (calc)   Total Bilirubin 0.7 0.2 -  1.2 mg/dL   Alkaline phosphatase (APISO) 59 37 - 153 U/L   AST 21 10 - 35 U/L   ALT 13 6 - 29 U/L  Hemoglobin A1c  Result Value Ref Range   Hgb A1c MFr Bld 5.6 <5.7 % of total Hgb   Mean Plasma Glucose 114 (calc)   eAG (mmol/L) 6.3 (calc)  Lipid panel  Result Value Ref Range   Cholesterol 228 (H) <200 mg/dL   HDL 74 > OR = 50 mg/dL   Triglycerides 110 <150 mg/dL   LDL Cholesterol (Calc) 132 (H) mg/dL (calc)   Total CHOL/HDL Ratio 3.1 <5.0 (calc)   Non-HDL Cholesterol (Calc) 154 (H) <130 mg/dL (calc)

## 2020-07-25 NOTE — Patient Instructions (Addendum)
It was great to see you again today, I will be in touch with your labs as soon as possible Try the atrovent nasal as needed for post- nasal drainage I would recommend a calcium/ vitamin D (approx 2000 iu vitamin D daily) to maintain bone health We will also order a mammogram at the breast center for you   Health Maintenance After Age 76 After age 62, you are at a higher risk for certain long-term diseases and infections as well as injuries from falls. Falls are a major cause of broken bones and head injuries in people who are older than age 79. Getting regular preventive care can help to keep you healthy and well. Preventive care includes getting regular testing and making lifestyle changes as recommended by your health care provider. Talk with your health care provider about:  Which screenings and tests you should have. A screening is a test that checks for a disease when you have no symptoms.  A diet and exercise plan that is right for you. What should I know about screenings and tests to prevent falls? Screening and testing are the best ways to find a health problem early. Early diagnosis and treatment give you the best chance of managing medical conditions that are common after age 74. Certain conditions and lifestyle choices may make you more likely to have a fall. Your health care provider may recommend:  Regular vision checks. Poor vision and conditions such as cataracts can make you more likely to have a fall. If you wear glasses, make sure to get your prescription updated if your vision changes.  Medicine review. Work with your health care provider to regularly review all of the medicines you are taking, including over-the-counter medicines. Ask your health care provider about any side effects that may make you more likely to have a fall. Tell your health care provider if any medicines that you take make you feel dizzy or sleepy.  Osteoporosis screening. Osteoporosis is a condition that  causes the bones to get weaker. This can make the bones weak and cause them to break more easily.  Blood pressure screening. Blood pressure changes and medicines to control blood pressure can make you feel dizzy.  Strength and balance checks. Your health care provider may recommend certain tests to check your strength and balance while standing, walking, or changing positions.  Foot health exam. Foot pain and numbness, as well as not wearing proper footwear, can make you more likely to have a fall.  Depression screening. You may be more likely to have a fall if you have a fear of falling, feel emotionally low, or feel unable to do activities that you used to do.  Alcohol use screening. Using too much alcohol can affect your balance and may make you more likely to have a fall. What actions can I take to lower my risk of falls? General instructions  Talk with your health care provider about your risks for falling. Tell your health care provider if: ? You fall. Be sure to tell your health care provider about all falls, even ones that seem minor. ? You feel dizzy, sleepy, or off-balance.  Take over-the-counter and prescription medicines only as told by your health care provider. These include any supplements.  Eat a healthy diet and maintain a healthy weight. A healthy diet includes low-fat dairy products, low-fat (lean) meats, and fiber from whole grains, beans, and lots of fruits and vegetables. Home safety  Remove any tripping hazards, such as rugs, cords,  and clutter.  Install safety equipment such as grab bars in bathrooms and safety rails on stairs.  Keep rooms and walkways well-lit. Activity   Follow a regular exercise program to stay fit. This will help you maintain your balance. Ask your health care provider what types of exercise are appropriate for you.  If you need a cane or walker, use it as recommended by your health care provider.  Wear supportive shoes that have nonskid  soles. Lifestyle  Do not drink alcohol if your health care provider tells you not to drink.  If you drink alcohol, limit how much you have: ? 0-1 drink a day for women. ? 0-2 drinks a day for men.  Be aware of how much alcohol is in your drink. In the [place] one drink equals one typical bottle of beer (12 oz), one-half glass of wine (5 oz), or one shot of hard liquor (1 oz).  Do not use any products that contain nicotine or tobacco, such as cigarettes and e-cigarettes. If you need help quitting, ask your health care provider. Summary  Having a healthy lifestyle and getting preventive care can help to protect your health and wellness after age 2.  Screening and testing are the best way to find a health problem early and help you avoid having a fall. Early diagnosis and treatment give you the best chance for managing medical conditions that are more common for people who are older than age 52.  Falls are a major cause of broken bones and head injuries in people who are older than age 18. Take precautions to prevent a fall at home.  Work with your health care provider to learn what changes you can make to improve your health and wellness and to prevent falls. This information is not intended to replace advice given to you by your health care provider. Make sure you discuss any questions you have with your health care provider. Document Revised: 01/07/2019 Document Reviewed: 07/30/2017 Elsevier Patient Education  2020 [employer].

## 2020-07-26 ENCOUNTER — Encounter: Payer: Self-pay | Admitting: Family Medicine

## 2020-07-26 DIAGNOSIS — G8929 Other chronic pain: Secondary | ICD-10-CM | POA: Diagnosis not present

## 2020-07-26 DIAGNOSIS — M545 Low back pain, unspecified: Secondary | ICD-10-CM | POA: Diagnosis not present

## 2020-07-31 ENCOUNTER — Encounter: Payer: Self-pay | Admitting: Family Medicine

## 2020-07-31 ENCOUNTER — Ambulatory Visit (INDEPENDENT_AMBULATORY_CARE_PROVIDER_SITE_OTHER): Payer: Medicare Other | Admitting: Family Medicine

## 2020-07-31 ENCOUNTER — Other Ambulatory Visit: Payer: Self-pay

## 2020-07-31 VITALS — BP 126/82 | HR 69 | Resp 15 | Ht 63.5 in | Wt 123.4 lb

## 2020-07-31 DIAGNOSIS — R0982 Postnasal drip: Secondary | ICD-10-CM

## 2020-07-31 DIAGNOSIS — I10 Essential (primary) hypertension: Secondary | ICD-10-CM | POA: Diagnosis not present

## 2020-07-31 DIAGNOSIS — E785 Hyperlipidemia, unspecified: Secondary | ICD-10-CM

## 2020-07-31 DIAGNOSIS — Z1231 Encounter for screening mammogram for malignant neoplasm of breast: Secondary | ICD-10-CM | POA: Diagnosis not present

## 2020-07-31 DIAGNOSIS — R7303 Prediabetes: Secondary | ICD-10-CM | POA: Diagnosis not present

## 2020-07-31 DIAGNOSIS — G894 Chronic pain syndrome: Secondary | ICD-10-CM | POA: Diagnosis not present

## 2020-07-31 MED ORDER — HYDROCODONE-ACETAMINOPHEN 5-325 MG PO TABS: ORAL_TABLET | ORAL | 0 refills | Status: DC

## 2020-07-31 MED ORDER — IPRATROPIUM BROMIDE 0.03 % NA SOLN: 2.0000 | Freq: Three times a day (TID) | NASAL | 12 refills | Status: DC

## 2020-08-01 ENCOUNTER — Encounter: Payer: Self-pay | Admitting: Family Medicine

## 2020-08-01 LAB — CBC
HCT: 41.8 % (ref 35.0–45.0)
Hemoglobin: 14.1 g/dL (ref 11.7–15.5)
MCH: 32.5 pg (ref 27.0–33.0)
MCHC: 33.7 g/dL (ref 32.0–36.0)
MCV: 96.3 fL (ref 80.0–100.0)
MPV: 10.2 fL (ref 7.5–12.5)
Platelets: 288 10*3/uL (ref 140–400)
RBC: 4.34 10*6/uL (ref 3.80–5.10)
RDW: 12.2 % (ref 11.0–15.0)
WBC: 7.5 10*3/uL (ref 3.8–10.8)

## 2020-08-01 LAB — COMPREHENSIVE METABOLIC PANEL
AG Ratio: 1.8 (calc) (ref 1.0–2.5)
ALT: 13 U/L (ref 6–29)
AST: 21 U/L (ref 10–35)
Albumin: 4.7 g/dL (ref 3.6–5.1)
Alkaline phosphatase (APISO): 59 U/L (ref 37–153)
BUN/Creatinine Ratio: 14 (calc) (ref 6–22)
BUN: 14 mg/dL (ref 7–25)
CO2: 29 mmol/L (ref 20–32)
Calcium: 10 mg/dL (ref 8.6–10.4)
Chloride: 101 mmol/L (ref 98–110)
Creat: 0.97 mg/dL — ABNORMAL HIGH (ref 0.60–0.93)
Globulin: 2.6 g/dL (calc) (ref 1.9–3.7)
Glucose, Bld: 95 mg/dL (ref 65–99)
Potassium: 4.4 mmol/L (ref 3.5–5.3)
Sodium: 140 mmol/L (ref 135–146)
Total Bilirubin: 0.7 mg/dL (ref 0.2–1.2)
Total Protein: 7.3 g/dL (ref 6.1–8.1)

## 2020-08-01 LAB — HEMOGLOBIN A1C
Hgb A1c MFr Bld: 5.6 % of total Hgb (ref <5.7)
Mean Plasma Glucose: 114 (calc)
eAG (mmol/L): 6.3 (calc)

## 2020-08-01 LAB — LIPID PANEL
Cholesterol: 228 mg/dL — ABNORMAL HIGH (ref <200)
HDL: 74 mg/dL (ref >=50)
LDL Cholesterol (Calc): 132 mg/dL (calc) — ABNORMAL HIGH
Non-HDL Cholesterol (Calc): 154 mg/dL (calc) — ABNORMAL HIGH (ref <130)
Total CHOL/HDL Ratio: 3.1 (calc) (ref <5.0)
Triglycerides: 110 mg/dL (ref <150)

## 2020-08-03 LAB — DRUG MONITORING, PANEL 8 WITH CONFIRMATION, URINE
6 Acetylmorphine: NEGATIVE ng/mL (ref <10)
Alcohol Metabolites: POSITIVE ng/mL — AB
Amphetamines: NEGATIVE ng/mL (ref <500)
Benzodiazepines: NEGATIVE ng/mL (ref <100)
Buprenorphine, Urine: NEGATIVE ng/mL (ref <5)
Cocaine Metabolite: NEGATIVE ng/mL (ref <150)
Codeine: NEGATIVE ng/mL (ref <50)
Creatinine: 112.4 mg/dL
Ethyl Glucuronide (ETG): 2058 ng/mL — ABNORMAL HIGH (ref <500)
Ethyl Sulfate (ETS): 505 ng/mL — ABNORMAL HIGH (ref <100)
Hydrocodone: NEGATIVE ng/mL (ref <50)
Hydromorphone: 183 ng/mL — ABNORMAL HIGH (ref <50)
MDMA: NEGATIVE ng/mL (ref <500)
Marijuana Metabolite: NEGATIVE ng/mL (ref <20)
Morphine: NEGATIVE ng/mL (ref <50)
Norhydrocodone: 191 ng/mL — ABNORMAL HIGH (ref <50)
Opiates: POSITIVE ng/mL — AB (ref <100)
Oxidant: NEGATIVE ug/mL
Oxycodone: NEGATIVE ng/mL (ref <100)
pH: 7.2 (ref 4.5–9.0)

## 2020-08-03 LAB — DM TEMPLATE

## 2020-08-04 ENCOUNTER — Other Ambulatory Visit: Payer: Self-pay | Admitting: Family Medicine

## 2020-08-04 DIAGNOSIS — G894 Chronic pain syndrome: Secondary | ICD-10-CM

## 2020-08-07 ENCOUNTER — Other Ambulatory Visit: Payer: Self-pay | Admitting: Family Medicine

## 2020-08-07 DIAGNOSIS — I10 Essential (primary) hypertension: Secondary | ICD-10-CM

## 2020-08-10 DIAGNOSIS — M5459 Other low back pain: Secondary | ICD-10-CM | POA: Diagnosis not present

## 2020-08-10 DIAGNOSIS — G8929 Other chronic pain: Secondary | ICD-10-CM | POA: Diagnosis not present

## 2020-08-16 DIAGNOSIS — H04123 Dry eye syndrome of bilateral lacrimal glands: Secondary | ICD-10-CM | POA: Diagnosis not present

## 2020-08-16 DIAGNOSIS — H0102A Squamous blepharitis right eye, upper and lower eyelids: Secondary | ICD-10-CM | POA: Diagnosis not present

## 2020-08-16 DIAGNOSIS — H0102B Squamous blepharitis left eye, upper and lower eyelids: Secondary | ICD-10-CM | POA: Diagnosis not present

## 2020-08-16 DIAGNOSIS — H18453 Nodular corneal degeneration, bilateral: Secondary | ICD-10-CM | POA: Diagnosis not present

## 2020-08-16 DIAGNOSIS — H10413 Chronic giant papillary conjunctivitis, bilateral: Secondary | ICD-10-CM | POA: Diagnosis not present

## 2020-08-16 DIAGNOSIS — H2513 Age-related nuclear cataract, bilateral: Secondary | ICD-10-CM | POA: Diagnosis not present

## 2020-08-18 ENCOUNTER — Ambulatory Visit
Admission: RE | Admit: 2020-08-18 | Discharge: 2020-08-18 | Disposition: A | Discharge status: Home / Self Care | Payer: Medicare Other | Source: Ambulatory Visit | Attending: Family Medicine | Admitting: Family Medicine

## 2020-08-18 ENCOUNTER — Other Ambulatory Visit: Payer: Self-pay

## 2020-08-18 DIAGNOSIS — Z1231 Encounter for screening mammogram for malignant neoplasm of breast: Secondary | ICD-10-CM | POA: Diagnosis not present

## 2020-08-23 ENCOUNTER — Other Ambulatory Visit: Payer: Self-pay | Admitting: Family Medicine

## 2020-08-23 DIAGNOSIS — R928 Other abnormal and inconclusive findings on diagnostic imaging of breast: Secondary | ICD-10-CM

## 2020-08-29 ENCOUNTER — Other Ambulatory Visit: Payer: Self-pay | Admitting: Family Medicine

## 2020-08-29 DIAGNOSIS — G4700 Insomnia, unspecified: Secondary | ICD-10-CM

## 2020-09-03 ENCOUNTER — Encounter: Payer: Self-pay | Admitting: Gynecologic Oncology

## 2020-09-04 DIAGNOSIS — H52213 Irregular astigmatism, bilateral: Secondary | ICD-10-CM | POA: Diagnosis not present

## 2020-09-04 DIAGNOSIS — M5459 Other low back pain: Secondary | ICD-10-CM | POA: Diagnosis not present

## 2020-09-04 DIAGNOSIS — H2513 Age-related nuclear cataract, bilateral: Secondary | ICD-10-CM | POA: Diagnosis not present

## 2020-09-04 DIAGNOSIS — H0102A Squamous blepharitis right eye, upper and lower eyelids: Secondary | ICD-10-CM | POA: Diagnosis not present

## 2020-09-04 DIAGNOSIS — H18453 Nodular corneal degeneration, bilateral: Secondary | ICD-10-CM | POA: Diagnosis not present

## 2020-09-04 DIAGNOSIS — H04123 Dry eye syndrome of bilateral lacrimal glands: Secondary | ICD-10-CM | POA: Diagnosis not present

## 2020-09-04 DIAGNOSIS — G8929 Other chronic pain: Secondary | ICD-10-CM | POA: Diagnosis not present

## 2020-09-04 DIAGNOSIS — H0102B Squamous blepharitis left eye, upper and lower eyelids: Secondary | ICD-10-CM | POA: Diagnosis not present

## 2020-09-05 ENCOUNTER — Telehealth: Payer: Self-pay | Admitting: *Deleted

## 2020-09-05 NOTE — Telephone Encounter (Signed)
Called the patient to schedule a follow up appt with Dr Denman George. Patient stated that she will call me back later today

## 2020-09-06 ENCOUNTER — Other Ambulatory Visit: Payer: Self-pay

## 2020-09-06 ENCOUNTER — Ambulatory Visit
Admission: RE | Admit: 2020-09-06 | Discharge: 2020-09-06 | Disposition: A | Discharge status: Home / Self Care | Payer: Medicare Other | Source: Ambulatory Visit | Attending: Family Medicine | Admitting: Family Medicine

## 2020-09-06 ENCOUNTER — Other Ambulatory Visit: Payer: Self-pay | Admitting: Family Medicine

## 2020-09-06 DIAGNOSIS — N6323 Unspecified lump in the left breast, lower outer quadrant: Secondary | ICD-10-CM | POA: Diagnosis not present

## 2020-09-06 DIAGNOSIS — N6321 Unspecified lump in the left breast, upper outer quadrant: Secondary | ICD-10-CM | POA: Diagnosis not present

## 2020-09-06 DIAGNOSIS — R928 Other abnormal and inconclusive findings on diagnostic imaging of breast: Secondary | ICD-10-CM | POA: Diagnosis not present

## 2020-09-06 NOTE — Telephone Encounter (Signed)
Patient called back and scheduled an appt for January

## 2020-09-08 ENCOUNTER — Telehealth: Payer: Self-pay

## 2020-09-08 NOTE — Telephone Encounter (Signed)
LM for Dawn Powers that Dawn John, NP stated that the endometrial cancer rarely would metastasize to the breast. Will not know until pathology from breast biopsy is done. Pt appreciated the information.

## 2020-09-08 NOTE — Telephone Encounter (Signed)
Dawn Powers was diagnosed with Breast cancer this week. It was found on her mammogram. Her biopsy is Friday 09-15-20 at the [facility].  The breast tumor is small and does not seem to have gone into the lymph nodes. Her question for Dr. Denman George is that could this be a new cancer or what are the chances it could be metastatic endometrial cancer?

## 2020-09-13 ENCOUNTER — Encounter: Payer: Self-pay | Admitting: Family Medicine

## 2020-09-13 DIAGNOSIS — N63 Unspecified lump in unspecified breast: Secondary | ICD-10-CM

## 2020-09-13 NOTE — Telephone Encounter (Signed)
Patient is very anxious and would like to speak to Dr. Lorelei Pont.

## 2020-09-15 ENCOUNTER — Other Ambulatory Visit: Payer: Medicare Other

## 2020-09-19 ENCOUNTER — Ambulatory Visit
Admission: RE | Admit: 2020-09-19 | Discharge: 2020-09-19 | Disposition: A | Discharge status: Home / Self Care | Payer: Medicare Other | Source: Ambulatory Visit | Attending: Family Medicine | Admitting: Family Medicine

## 2020-09-19 ENCOUNTER — Other Ambulatory Visit: Payer: Self-pay

## 2020-09-19 DIAGNOSIS — Z170 Estrogen receptor positive status [ER+]: Secondary | ICD-10-CM | POA: Diagnosis not present

## 2020-09-19 DIAGNOSIS — R928 Other abnormal and inconclusive findings on diagnostic imaging of breast: Secondary | ICD-10-CM

## 2020-09-19 DIAGNOSIS — N6321 Unspecified lump in the left breast, upper outer quadrant: Secondary | ICD-10-CM | POA: Diagnosis not present

## 2020-09-19 DIAGNOSIS — C50812 Malignant neoplasm of overlapping sites of left female breast: Secondary | ICD-10-CM | POA: Diagnosis not present

## 2020-09-19 DIAGNOSIS — C50919 Malignant neoplasm of unspecified site of unspecified female breast: Secondary | ICD-10-CM

## 2020-09-19 HISTORY — DX: Malignant neoplasm of unspecified site of unspecified female breast: C50.919

## 2020-09-20 ENCOUNTER — Other Ambulatory Visit: Payer: Medicare Other

## 2020-09-20 NOTE — Progress Notes (Signed)
[place]  [phone] [phone]) [phone]     ID: Dawn Powers DOB: 1946-09-20  MR#: [id number]  MHD#:[id number]  Patient Care Team: Darreld Mclean, MD as PCP - General (Family Medicine) Casia Corti, Virgie Dad, MD as Consulting Physician (Oncology) Stark Klein, MD as Consulting Physician (General Surgery) Everitt Amber, MD as Consulting Physician (Gynecologic Oncology) Gery Pray, MD as Consulting Physician (Radiation Oncology) Cheri Fowler, MD as Consulting Physician (Obstetrics and Gynecology) Chauncey Cruel, MD OTHER MD:  CHIEF COMPLAINT: Estrogen receptor positive breast cancer  CURRENT TREATMENT: Awaiting definitive surgery   HISTORY OF CURRENT ILLNESS: Dawn Powers had routine screening mammography on 08/18/2020 showing a possible abnormality in the left breast. She underwent left diagnostic mammography with tomography and left breast ultrasonography at [facility] on 09/06/2020 showing: breast density category B; palpable 1.1 cm mass at 3 o'clock; normal-appearing left axillary lymph nodes.  Accordingly on 09/19/2020 she proceeded to biopsy of the left breast area in question. The pathology from this procedure ([id number]) showed: invasive mammary carcinoma, e-cadherin positive, grade 3. Prognostic indicators significant for: estrogen receptor, 95% positive and progesterone receptor, 10% % positive, both with strong staining intensity. Proliferation marker Ki67 at 10%. HER2 equivocal by immunohistochemistry with fluorescent in situ hybridization pending  The patient's subsequent history is as detailed below.   INTERVAL HISTORY: Dawn Powers was evaluated in the breast cancer clinic on 09/21/2020 accompanied by her daughter Dawn Powers   REVIEW OF SYSTEMS: There were no specific symptoms leading to the original mammogram, which was routinely scheduled. The patient denies unusual headaches, visual changes, nausea, vomiting, stiff neck,  dizziness, or gait imbalance. There has been no cough, phlegm production, or pleurisy, no chest pain or pressure, and no change in bowel or bladder habits. The patient denies fever, rash, bleeding, unexplained fatigue or unexplained weight loss.  She admits to feeling very anxious regarding her new diagnosis.  She exercises regularly A detailed review of systems was otherwise entirely negative.   COVID 19 VACCINATION STATUS: Status post [employer] x2 with booster August 2021   PAST MEDICAL HISTORY: Past Medical History:  Diagnosis Date  . Arthritis   . Cancer [facility])    Endometrial   . Heart murmur   . Hyperlipidemia   . Hypertension   . Neuromuscular disorder ([place])    DDD  . Pain    lower back -hx "DDD"  . Radiation 11/15/15, 11/23/15, 11/30/15, 12/07/15, 12/14/15   HDR brachytherapy    PAST SURGICAL HISTORY: Past Surgical History:  Procedure Laterality Date  . ABDOMINAL HYSTERECTOMY    . COLONOSCOPY    . FRACTURE SURGERY Right    ankle  . GANGLION CYST EXCISION    . LYMPH NODE BIOPSY N/A 09/26/2015   Procedure: SENTINEL LYMPH NODE BIOPSY;  Surgeon: Everitt Amber, MD;  Location: [facility];  Service: Gynecology;  Laterality: N/A;  . ROBOTIC ASSISTED TOTAL HYSTERECTOMY WITH BILATERAL SALPINGO OOPHERECTOMY Bilateral 09/26/2015   Procedure: ROBOTIC ASSISTED TOTAL HYSTERECTOMY WITH BILATERAL SALPINGO OOPHORECTOMY;  Surgeon: Everitt Amber, MD;  Location: [facility];  Service: Gynecology;  Laterality: Bilateral;  . TOTAL SHOULDER ARTHROPLASTY Left 09/10/2018   Procedure: TOTAL SHOULDER ARTHROPLASTY;  Surgeon: Justice Britain, MD;  Location: [facility];  Service: Orthopedics;  Laterality: Left;  188min  . WISDOM TOOTH EXTRACTION      FAMILY HISTORY: Family History  Problem Relation Age of Onset  . Heart disease Mother   . Alcohol abuse Mother        breast cancer -  questionable   . Dementia Mother   . Alcohol abuse Father   . Prostate cancer Father 3       prostate and colon- unsure of primary   . Kidney  cancer Brother   . Alcohol abuse Maternal Grandfather   . Alcohol abuse Sister   . Esophageal cancer Neg Hx   . Stomach cancer Neg Hx   . Rectal cancer Neg Hx   The patient's father died with prostate cancer at the age of 60.  The patient's mother died from heart disease at the age of 88.  She had had a mastectomy for unclear reasons.  The patient has 1 sister in good health and 2 brothers 1 of whom has had prostate cancer   GYNECOLOGIC HISTORY:  No LMP recorded. Patient is postmenopausal. Menarche: 74 years old Age at first live birth: 74 years old [place] P 3 LMP 25 HRT about 2 years Hysterectomy? Yes, 08/2015 for endometrial cancer BSO? 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  Her husband Dawn Powers is a retired Administrator, teaching in the modern languages in film department.  At home it is just the 2 of them plus their hound dog.  Daughter Dawn Powers "the librarian" works at [employer].  Second child Dawn Powers died at age 99.  Daughter Dawn Powers lives in [place].  The patient has 2 grandchildren and attends [place]    ADVANCED DIRECTIVES: In the absence of any documentation to the contrary, the patient's spouse is their HCPOA.    HEALTH MAINTENANCE: Social History   Tobacco Use  . Smoking status: Former Smoker    Years: 4.00    Types: Cigarettes    Quit date: 09/30/1998    Years since quitting: 21.9  . Smokeless tobacco: Never Used  Vaping Use  . Vaping Use: Never used  Substance Use Topics  . Alcohol use: Yes    Alcohol/week: 3.0 standard drinks    Types: 3 Standard drinks or equivalent per week    Comment: WINE OR [place] - weekly  . Drug use: No     Colonoscopy: 08/2015 (Dr. Carlean Purl), repeat not indicated due to age  PAP: Status post hysterectomy  Bone density: 08/2009, -2.0   Allergies  Allergen Reactions  . Morphine And Related Hives    Current Outpatient Medications  Medication Sig Dispense Refill  .  HYDROcodone-acetaminophen (NORCO/VICODIN) 5-325 MG tablet TAKE 1 OR 2 TABLETS EVERY 6 HOURS AS NEEDED FOR PAIN. 90 tablet 0  . anastrozole (ARIMIDEX) 1 MG tablet Take 1 tablet (1 mg total) by mouth daily. 90 tablet 4  . fluticasone (FLONASE) 50 MCG/ACT nasal spray Place 2 sprays into both nostrils daily. (Patient not taking: Reported on 09/21/2020) 16 g 1  . ipratropium (ATROVENT) 0.03 % nasal spray Place 2 sprays into both nostrils 3 (three) times daily. Use as needed for post- nasal drip (Patient not taking: Reported on 09/21/2020) 30 mL 12  . losartan (COZAAR) 50 MG tablet TAKE 1 TABLET ONCE DAILY. (Patient taking differently: 25 mg.) 90 tablet 1  . traZODone (DESYREL) 50 MG tablet Take 1.5 tablets (75 mg total) by mouth at bedtime as needed for sleep. (Patient taking differently: Take 25 mg by mouth at bedtime as needed for sleep.) 135 tablet 1   No current facility-administered medications for this visit.    OBJECTIVE: White woman who appears younger than stated age  42:   09/21/20 1132  BP: (!) 151/67  Pulse: 67  Resp: 18  Temp: 97.9 F (36.6 C)  SpO2: 100%     Body mass index is 21.2 kg/m.   Wt Readings from Last 3 Encounters:  09/21/20 121 lb 9.6 oz (55.2 kg)  07/31/20 123 lb 6.4 oz (56 kg)  04/12/20 125 lb 12.8 oz (57.1 kg)      ECOG FS:1 - Symptomatic but completely ambulatory  Ocular: Sclerae unicteric, pupils round and equal Ear-nose-throat: Wearing a mask Lymphatic: No cervical or supraclavicular adenopathy Lungs no rales or rhonchi Heart regular rate and rhythm Abd soft, nontender, positive bowel sounds MSK no focal spinal tenderness, no joint edema Neuro: non-focal, well-oriented, appropriate affect Breasts: Both breasts are lumpy.  In the left breast in particular laterally there is a movable hard mass measuring approximately 1 cm with no skin or nipple involvement.  Both axillae are benign.   LAB RESULTS:  CMP     Component Value Date/Time   NA 140  07/31/2020 1045   K 4.4 07/31/2020 1045   CL 101 07/31/2020 1045   CO2 29 07/31/2020 1045   GLUCOSE 95 07/31/2020 1045   BUN 14 07/31/2020 1045   CREATININE 0.97 (H) 07/31/2020 1045   CALCIUM 10.0 07/31/2020 1045   PROT 7.3 07/31/2020 1045   ALBUMIN 4.5 07/01/2019 0734   AST 21 07/31/2020 1045   ALT 13 07/31/2020 1045   ALKPHOS 62 07/01/2019 0734   BILITOT 0.7 07/31/2020 1045   GFRNONAA >60 09/03/2018 0934   GFRNONAA 70 04/11/2016 1200   GFRAA >60 09/03/2018 0934   GFRAA 80 04/11/2016 1200    Lab Results  Component Value Date   TOTALPROTELP 6.5 07/20/2014   TOTALPROTELP 6.5 07/20/2014   ALBUMINELP 63.3 07/20/2014   A1GS 4.7 07/20/2014   A2GS 11.4 07/20/2014   BETS 5.7 07/20/2014   BETA2SER 4.2 07/20/2014   GAMS 10.7 (L) 07/20/2014   MSPIKE NOT DET 07/20/2014   SPEI SEE NOTE 07/20/2014    Lab Results  Component Value Date   WBC 7.5 07/31/2020   NEUTROABS 2.5 08/14/2018   HGB 14.1 07/31/2020   HCT 41.8 07/31/2020   MCV 96.3 07/31/2020   PLT 288 07/31/2020    No results found for: LABCA2  No components found for: KKXFGH829  No results for input(s): INR in the last 168 hours.  No results found for: LABCA2  No results found for: HBZ169  No results found for: CVE938  No results found for: BOF751  No results found for: CA2729  No components found for: HGQUANT  No results found for: CEA1 / No results found for: CEA1   No results found for: AFPTUMOR  No results found for: CHROMOGRNA  No results found for: KPAFRELGTCHN, LAMBDASER, KAPLAMBRATIO (kappa/lambda light chains)  No results found for: HGBA, HGBA2QUANT, HGBFQUANT, HGBSQUAN (Hemoglobinopathy evaluation)   No results found for: LDH  No results found for: IRON, TIBC, IRONPCTSAT (Iron and TIBC)  No results found for: FERRITIN  Urinalysis    Component Value Date/Time   COLORURINE YELLOW 09/19/2015 1400   APPEARANCEUR CLEAR 09/19/2015 1400   LABSPEC 1.023 09/19/2015 1400   PHURINE 7.0  09/19/2015 1400   GLUCOSEU NEGATIVE 09/19/2015 1400   HGBUR NEGATIVE 09/19/2015 1400   BILIRUBINUR negative 11/06/2016 1411   BILIRUBINUR neg 05/23/2015 1003   KETONESUR negative 11/06/2016 1411   KETONESUR NEGATIVE 09/19/2015 1400   PROTEINUR negative 11/06/2016 [place] 09/19/2015 1400   UROBILINOGEN negative 11/06/2016 1411   NITRITE Negative 11/06/2016 1411   NITRITE NEGATIVE 09/19/2015  1400   LEUKOCYTESUR Negative 11/06/2016 1411     STUDIES: US BREAST LTD UNI LEFT INC AXILLA  Result Date: 09/07/2020 CLINICAL DATA:  Possible mass in the posterior, outer left breast on a recent screening mammogram. EXAM: DIGITAL DIAGNOSTIC LEFT MAMMOGRAM WITH TOMO ULTRASOUND LEFT BREAST COMPARISON:  Previous exam(s). ACR Breast Density Category b: There are scattered areas of fibroglandular density. FINDINGS: 3D tomographic and 2D generated spot compression images of the left breast confirm an irregular mass with mild architectural distortion in the posterior aspect of the outer left breast. On physical exam, there is an approximately 1 cm oval, mildly tender palpable mass in the 3 o'clock position of the left breast, 6 cm from the nipple. Targeted ultrasound is performed, showing a 1.1 x 1.0 x 0.7 cm oval, horizontally oriented hypoechoic mass with faint central calcifications in the 3 o'clock position of the left breast, 6 cm from the nipple. This has predominantly indistinct and slightly irregular margins and no increased or decreased through transmission of sound. This corresponds to the mammographic mass. Ultrasound of the left axilla demonstrated normal appearing left axillary lymph nodes. IMPRESSION: 1.1 cm mass with imaging features highly suspicious malignancy in the 3 o'clock position of the left breast. RECOMMENDATION: Ultrasound-guided core needle biopsy of the 1.1 cm mass in the 3 o'clock position of the left breast. This has been discussed with the patient and scheduled at 7:30  a.m. on 09/20/2020. I have discussed the findings and recommendations with the patient. If applicable, a reminder letter will be sent to the patient regarding the next appointment. BI-RADS CATEGORY  5: Highly suggestive of malignancy. Electronically Signed   By: Claudie Revering M.D.   On: 09/06/2020 12:25   MM DIAG BREAST TOMO UNI LEFT  Result Date: 09/06/2020 CLINICAL DATA:  Possible mass in the posterior, outer left breast on a recent screening mammogram. EXAM: DIGITAL DIAGNOSTIC LEFT MAMMOGRAM WITH TOMO ULTRASOUND LEFT BREAST COMPARISON:  Previous exam(s). ACR Breast Density Category b: There are scattered areas of fibroglandular density. FINDINGS: 3D tomographic and 2D generated spot compression images of the left breast confirm an irregular mass with mild architectural distortion in the posterior aspect of the outer left breast. On physical exam, there is an approximately 1 cm oval, mildly tender palpable mass in the 3 o'clock position of the left breast, 6 cm from the nipple. Targeted ultrasound is performed, showing a 1.1 x 1.0 x 0.7 cm oval, horizontally oriented hypoechoic mass with faint central calcifications in the 3 o'clock position of the left breast, 6 cm from the nipple. This has predominantly indistinct and slightly irregular margins and no increased or decreased through transmission of sound. This corresponds to the mammographic mass. Ultrasound of the left axilla demonstrated normal appearing left axillary lymph nodes. IMPRESSION: 1.1 cm mass with imaging features highly suspicious malignancy in the 3 o'clock position of the left breast. RECOMMENDATION: Ultrasound-guided core needle biopsy of the 1.1 cm mass in the 3 o'clock position of the left breast. This has been discussed with the patient and scheduled at 7:30 a.m. on 09/20/2020. I have discussed the findings and recommendations with the patient. If applicable, a reminder letter will be sent to the patient regarding the next appointment.  BI-RADS CATEGORY  5: Highly suggestive of malignancy. Electronically Signed   By: Claudie Revering M.D.   On: 09/06/2020 12:25   MM CLIP PLACEMENT LEFT  Result Date: 09/19/2020 CLINICAL DATA:  74 year old female status post ultrasound-guided biopsy of the left breast. EXAM: DIAGNOSTIC  LEFT MAMMOGRAM POST ULTRASOUND BIOPSY COMPARISON:  Previous exam(s). FINDINGS: Mammographic images were obtained following ultrasound guided biopsy of the left breast. The biopsy marking clip is in expected position at the site of biopsy. IMPRESSION: Appropriate positioning of the ribbon shaped biopsy marking clip at the site of biopsy in the upper-outer left breast. Final Assessment: Post Procedure Mammograms for Marker Placement Electronically Signed   By: Kristopher Oppenheim M.D.   On: 09/19/2020 08:27   [place] LT BREAST BX W LOC DEV 1ST LESION IMG BX SPEC US GUIDE  Result Date: 09/19/2020 CLINICAL DATA:  74 year old female with a suspicious left breast mass. EXAM: ULTRASOUND GUIDED LEFT BREAST CORE NEEDLE BIOPSY COMPARISON:  Previous exam(s). PROCEDURE: I met with the patient and we discussed the procedure of ultrasound-guided biopsy, including benefits and alternatives. We discussed the high likelihood of a successful procedure. We discussed the risks of the procedure, including infection, bleeding, tissue injury, clip migration, and inadequate sampling. Informed written consent was given. The usual time-out protocol was performed immediately prior to the procedure. Lesion quadrant: Upper outer quadrant Using sterile technique and 1% Lidocaine as local anesthetic, under direct ultrasound visualization, a 12 gauge spring-loaded device was used to perform biopsy of a mass at the 3 o'clock position using a inferior approach. At the conclusion of the procedure a ribbon shaped tissue marker clip was deployed into the biopsy cavity. Follow up 2 view mammogram was performed and dictated separately. IMPRESSION: Ultrasound guided biopsy of  the left breast. No apparent complications. Electronically Signed   By: Kristopher Oppenheim M.D.   On: 09/19/2020 08:16     ELIGIBLE FOR AVAILABLE RESEARCH PROTOCOL: AET  ASSESSMENT: 74 y.o. [place] woman status post right breast upper outer quadrant biopsy 09/19/2020 for a clinical T1c N0, stage IA invasive ductal carcinoma, grade 3, estrogen and progesterone receptor positive, with an MIB-1 of 10% and HER-2 equivocal with FISH pending  (1) genetics testing  (2) definitive surgery pending  (3) Oncotype will be obtained from the definitive surgical sample  (4) adjuvant radiation as appropriate  (5) anastrozole started neoadjuvantly 09/21/2020  PLAN: I met today with Dawn Powers to review her new diagnosis. Specifically we discussed the biology of her breast cancer, its diagnosis, staging, treatment  options and prognosis. We first reviewed the fact that cancer is not one disease but more than 100 different diseases and that it is important to keep them separate-- otherwise when friends and relatives discuss their own cancer experiences with Dawn Powers confusion can result. Similarly we explained that if breast cancer spreads to the bone or liver, the patient would not have bone cancer or liver cancer, but breast cancer in the bone and breast cancer in the liver: one cancer in three places-- not 3 different cancers which otherwise would have to be treated in 3 different ways.  We discussed the difference between local and systemic therapy. In terms of loco-regional treatment, lumpectomy plus radiation is equivalent to mastectomy as far as survival is concerned. For this reason, and because the cosmetic results are generally superior, we recommend breast conserving surgery.   We also noted that in terms of sequencing of treatments, whether systemic therapy or surgery is done first does not affect the ultimate outcome.  In fact in Dawn Powers's case we are starting anastrozole neoadjuvantly in case there are some  surgical delays because of the holidays.  We then discussed the rationale for systemic therapy. There is some risk that this cancer may have already spread to other parts  of her body. Patients frequently ask at this point about bone scans, CAT scans and PET scans to find out if they have occult breast cancer somewhere else. The problem is that in early stage disease we are much more likely to find false positives then true cancers and this would expose the patient to unnecessary procedures as well as unnecessary radiation. Scans cannot answer the question the patient really would like to know, which is whether she has microscopic disease elsewhere in her body. For those reasons we do not recommend them.  Of course we would proceed to aggressive evaluation of any symptoms that might suggest metastatic disease, but that is not the case here.  Next we went over the options for systemic therapy which are anti-estrogens, anti-HER-2 immunotherapy, and chemotherapy. Dawn Powers's cancer has unequivocal her to, but I expect this to be negative as most equivocal HER-2 Dawn Powers and also because of the low MIB-1.  Of course if the tumor proves to be HER-2 positive that would change the treatment plan proposed below.  The question of chemotherapy is more complicated. Chemotherapy is most effective in rapidly growing, aggressive tumors. It is much less effective in low-grade, slow growing cancers. Dawn Powers 's situation is in between and for that reason we are going to request an Oncotype from the definitive surgical sample, as suggested by NCCN guidelines. That will help [place] make a definitive decision regarding chemotherapy in this case.  The patient does qualify for genetics testing. In patients who carry a deleterious mutation [for example in a  BRCA gene], the risk of a new breast cancer developing in the future may be sufficiently great that the patient may choose bilateral mastectomies. However if she wishes to keep her breasts  in that situation it is safe to do so. That would require intensified screening, which generally means not only yearly mammography but a yearly breast MRI as well..  As stated above we have started anastrozole so that Dawn Powers treatment can be started while waiting for her definitive surgery.  She is aware of the possible toxicities side effects and complications of anastrozole which does not affect clotting and therefore does not need to be stopped perioperatively.  Dawn Powers has a good understanding of the overall plan. She agrees with it. She knows the goal of treatment in her case is cure. She will call with any problems that may develop before her next visit here.  Total encounter time 65 minutes.Dawn Powers C. Carlea Badour, MD 09/21/2020 7:34 PM Medical Oncology and Hematology [facility] [place], [place] [address] Tel. [phone]    Fax. [phone]   This document serves as a record of services personally performed by Lurline Del, MD. It was created on his behalf by Wilburn Mylar, a trained medical scribe. The creation of this record is based on the scribe's personal observations and the provider's statements to them.   I, Lurline Del MD, have reviewed the above documentation for accuracy and completeness, and I agree with the above.    *Total Encounter Time as defined by the Centers for Medicare and Medicaid Services includes, in addition to the face-to-face time of a patient visit (documented in the note above) non-face-to-face time: obtaining and reviewing outside history, ordering and reviewing medications, tests or procedures, care coordination (communications with other health care professionals or caregivers) and documentation in the medical record.

## 2020-09-21 ENCOUNTER — Inpatient Hospital Stay: Payer: Medicare Other | Attending: Oncology | Admitting: Oncology

## 2020-09-21 ENCOUNTER — Other Ambulatory Visit: Payer: Self-pay

## 2020-09-21 VITALS — BP 151/67 | HR 67 | Temp 97.9°F | Resp 18 | Ht 63.5 in | Wt 121.6 lb

## 2020-09-21 DIAGNOSIS — C50412 Malignant neoplasm of upper-outer quadrant of left female breast: Secondary | ICD-10-CM | POA: Diagnosis not present

## 2020-09-21 DIAGNOSIS — Z923 Personal history of irradiation: Secondary | ICD-10-CM | POA: Diagnosis not present

## 2020-09-21 DIAGNOSIS — Z90710 Acquired absence of both cervix and uterus: Secondary | ICD-10-CM | POA: Diagnosis not present

## 2020-09-21 DIAGNOSIS — Z79811 Long term (current) use of aromatase inhibitors: Secondary | ICD-10-CM

## 2020-09-21 DIAGNOSIS — Z8042 Family history of malignant neoplasm of prostate: Secondary | ICD-10-CM | POA: Diagnosis not present

## 2020-09-21 DIAGNOSIS — Z8542 Personal history of malignant neoplasm of other parts of uterus: Secondary | ICD-10-CM | POA: Insufficient documentation

## 2020-09-21 DIAGNOSIS — Z87891 Personal history of nicotine dependence: Secondary | ICD-10-CM | POA: Insufficient documentation

## 2020-09-21 DIAGNOSIS — Z170 Estrogen receptor positive status [ER+]: Secondary | ICD-10-CM | POA: Insufficient documentation

## 2020-09-21 DIAGNOSIS — Z8249 Family history of ischemic heart disease and other diseases of the circulatory system: Secondary | ICD-10-CM | POA: Diagnosis not present

## 2020-09-21 DIAGNOSIS — C541 Malignant neoplasm of endometrium: Secondary | ICD-10-CM

## 2020-09-21 DIAGNOSIS — Z9221 Personal history of antineoplastic chemotherapy: Secondary | ICD-10-CM | POA: Diagnosis not present

## 2020-09-21 MED ORDER — ANASTROZOLE 1 MG PO TABS: 1.0000 mg | ORAL_TABLET | Freq: Every day | ORAL | 4 refills | Status: DC

## 2020-09-25 ENCOUNTER — Telehealth: Payer: Self-pay | Admitting: Oncology

## 2020-09-25 ENCOUNTER — Encounter: Payer: Self-pay | Admitting: *Deleted

## 2020-09-25 ENCOUNTER — Telehealth: Payer: Self-pay | Admitting: *Deleted

## 2020-09-25 NOTE — Telephone Encounter (Signed)
Left message for a return phone call to follow up from new patient appointment and assess navigation needs. Contact information given.

## 2020-09-25 NOTE — Telephone Encounter (Signed)
Scheduled appts per 12/23 los. Left voicemail with appt date, time, and details of it being a virtual visit.

## 2020-09-28 ENCOUNTER — Encounter: Payer: Self-pay | Admitting: *Deleted

## 2020-10-04 NOTE — Progress Notes (Signed)
Gynecologic Oncology Follow-up  Chief Complaint:  Chief Complaint  Patient presents with  . Endometrial cancer (HCC)   FOLLOW UP VISIT  Assessment:    75 y.o. 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   S/p robotic hysterectomy, BSO, sentinel lymph node biopsy on 09/26/15 with high intermediate risk factors for recurrence, s/p vaginal brachytherapy adjuvant therapy completed in March, 2017.  No evidence of recurrence on today's exam.  New diagnosis of breast cancer, awaiting surgery and treatment.  Plan: Discussed symptoms concerning for recurrence and recommendation to return to see [place] immediately should they develop.  Return to clinic in 6 months at which time she will graduate from cancer surveillance as it will have been greater than 5 years since completing therapy.  HPI:  Dawn Powers is a 75 y.o. year old initially seen in consultation on 09/08/15 referred by Dr Meissinger for grade 2 endometrial cancer.  She then underwent a robotic hysterectomy, BSO and bilateral SLN biopsy on 09/26/15 without complications.  Her postoperative course was uncomplicated.  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 MSI stable. She was recommended adjuvant vaginal brachytherapy for high/intermediate risk factors.  She completed vaginal brachytherapy in March, 2017.  She had shoulder replacement surgery in December, 2019.   Interval Hx:  She has begun seeing acupuncturist Raynelle Chary from [facility] and this has resulted in her being able to stop taking oxycodone for her chronic back pain.  She has had mild left lower quadrant pains intermittently that are not bothersome.   In December, 2021 she was diagnosed with breast cancer and was undergoing work-up for planned surgery, radiation and possible  chemotherapy.   Past Medical History:  Diagnosis Date  . Arthritis   . Breast cancer (HCC) 09/19/2020  . Cancer [facility])    Endometrial   . Heart murmur   . Hyperlipidemia   . Hypertension   . Neuromuscular disorder (HCC)    DDD  . Pain    lower back -hx "DDD"  . Radiation 11/15/15, 11/23/15, 11/30/15, 12/07/15, 12/14/15   HDR brachytherapy   Past Surgical History:  Procedure Laterality Date  . ABDOMINAL HYSTERECTOMY    . COLONOSCOPY    . FRACTURE SURGERY Right    ankle  . GANGLION CYST EXCISION    . LYMPH NODE BIOPSY N/A 09/26/2015   Procedure: SENTINEL LYMPH NODE BIOPSY;  Surgeon: Adolphus Birchwood, MD;  Location: [facility] ORS;  Service: Gynecology;  Laterality: N/A;  . ROBOTIC ASSISTED TOTAL HYSTERECTOMY WITH BILATERAL SALPINGO OOPHERECTOMY Bilateral 09/26/2015   Procedure: ROBOTIC ASSISTED TOTAL HYSTERECTOMY WITH BILATERAL SALPINGO OOPHORECTOMY;  Surgeon: Adolphus Birchwood, MD;  Location: [facility];  Service: Gynecology;  Laterality: Bilateral;  . TOTAL SHOULDER ARTHROPLASTY Left 09/10/2018   Procedure: TOTAL SHOULDER ARTHROPLASTY;  Surgeon: Francena Hanly, MD;  Location: [facility] OR;  Service: Orthopedics;  Laterality: Left;   . WISDOM TOOTH EXTRACTION     Family History  Problem Relation Age of Onset  . Heart disease Mother   . Alcohol abuse Mother        breast cancer - questionable   . Dementia Mother   . Alcohol abuse Father   . Prostate cancer Father 51       prostate and colon- unsure of primary   . Kidney cancer Brother   . Alcohol abuse Maternal Grandfather   .  Alcohol abuse Sister   . Esophageal cancer Neg Hx   . Stomach cancer Neg Hx   . Rectal cancer Neg Hx    Social History   Socioeconomic History  . Marital status: Married    Spouse name: Not on file  . Number of children: 2  . Years of education: Not on file  . Highest education level: Not on file  Occupational History  . Not on file  Tobacco Use  . Smoking status: Former Smoker    Years: 4.00    Types: Cigarettes     Quit date: 09/30/1998    Years since quitting: 22.0  . Smokeless tobacco: Never Used  Vaping Use  . Vaping Use: Never used  Substance and Sexual Activity  . Alcohol use: Yes    Alcohol/week: 3.0 standard drinks    Types: 3 Standard drinks or equivalent per week    Comment: WINE OR [place] - weekly  . Drug use: No  . Sexual activity: Yes    Partners: Female    Birth control/protection: None  Other Topics Concern  . Not on file  Social History Narrative   Married. Education: college and Other. Exercise 4 times a week for 50 minutes.   Social Determinants of Health   Financial Resource Strain: Not on file  Food Insecurity: Not on file  Transportation Needs: Not on file  Physical Activity: Not on file  Stress: Not on file  Social Connections: Not on file  Intimate Partner Violence: Not on file   Current Outpatient Medications on File Prior to Visit  Medication Sig Dispense Refill  . anastrozole (ARIMIDEX) 1 MG tablet Take 1 tablet (1 mg total) by mouth daily. 90 tablet 4  . HYDROcodone-acetaminophen (NORCO/VICODIN) 5-325 MG tablet TAKE 1 OR 2 TABLETS EVERY 6 HOURS AS NEEDED FOR PAIN. 90 tablet 0  . losartan (COZAAR) 50 MG tablet TAKE 1 TABLET ONCE DAILY. (Patient taking differently: 25 mg.) 90 tablet 1  . traZODone (DESYREL) 50 MG tablet Take 1.5 tablets (75 mg total) by mouth at bedtime as needed for sleep. (Patient taking differently: Take 25 mg by mouth at bedtime as needed for sleep.) 135 tablet 1  . fluticasone (FLONASE) 50 MCG/ACT nasal spray Place 2 sprays into both nostrils daily. (Patient not taking: No sig reported) 16 g 1   No current facility-administered medications on file prior to visit.   Allergies  Allergen Reactions  . Morphine And Related Hives      Review of systems: Constitutional:  She has no weight gain or weight loss. She has no fever or chills. Eyes: No blurred vision Ears, Nose, Mouth, Throat: No dizziness, headaches or changes in hearing. No mouth  sores. Cardiovascular: No chest pain, palpitations or edema. Respiratory:  No shortness of breath, wheezing or cough Gastrointestinal: She has normal bowel movements without diarrhea or constipation. She denies any nausea or vomiting. She denies blood in her stool or heart burn. Occasional pelvic twinges. Genitourinary:  She denies pelvic pain, pelvic pressure or changes in her urinary function. She has no hematuria, dysuria, or incontinence. She has no irregular vaginal bleeding or vaginal discharge Musculoskeletal: Denies muscle weakness or joint pains.  Skin:  She has no skin changes, rashes or itching Neurological: resolved neuropathic back pain. Psychiatric:  She denies depression or anxiety. Hematologic/Lymphatic:   No easy bruising or bleeding   Physical Exam: WD in NAD Neck  Supple NROM, without any enlargements.  Lymph Node Survey No cervical supraclavicular or inguinal adenopathy  Cardiovascular  Well perfused peripheries Lungs  No increased WOB Skin  No rash/lesions/breakdown. Psychiatry  Alert and oriented to person, place, and time  Abdomen  Normoactive bowel sounds, abdomen soft, non-tender and thin without evidence of hernia.  Back No CVA tenderness Genito Urinary  Vaginal cuff smooth, no lesions. No palpable masses.  Rectal  Good tone, no masses no cul de sac nodularity. Tenderness on deep palpation but no masses.  Extremities  No bilateral cyanosis, clubbing or edema.   Thereasa Solo, MD

## 2020-10-09 ENCOUNTER — Other Ambulatory Visit: Payer: Self-pay | Admitting: General Surgery

## 2020-10-09 DIAGNOSIS — C50412 Malignant neoplasm of upper-outer quadrant of left female breast: Secondary | ICD-10-CM

## 2020-10-09 DIAGNOSIS — Z170 Estrogen receptor positive status [ER+]: Secondary | ICD-10-CM

## 2020-10-10 ENCOUNTER — Encounter: Payer: Self-pay | Admitting: Gynecologic Oncology

## 2020-10-11 ENCOUNTER — Inpatient Hospital Stay: Payer: Medicare Other | Attending: Oncology | Admitting: Gynecologic Oncology

## 2020-10-11 ENCOUNTER — Encounter: Payer: Self-pay | Admitting: Gynecologic Oncology

## 2020-10-11 ENCOUNTER — Other Ambulatory Visit: Payer: Self-pay

## 2020-10-11 VITALS — BP 149/77 | HR 66 | Temp 96.8°F | Resp 18 | Ht 63.5 in | Wt 122.0 lb

## 2020-10-11 DIAGNOSIS — M549 Dorsalgia, unspecified: Secondary | ICD-10-CM | POA: Diagnosis not present

## 2020-10-11 DIAGNOSIS — Z08 Encounter for follow-up examination after completed treatment for malignant neoplasm: Secondary | ICD-10-CM | POA: Insufficient documentation

## 2020-10-11 DIAGNOSIS — I10 Essential (primary) hypertension: Secondary | ICD-10-CM | POA: Insufficient documentation

## 2020-10-11 DIAGNOSIS — R011 Cardiac murmur, unspecified: Secondary | ICD-10-CM | POA: Diagnosis not present

## 2020-10-11 DIAGNOSIS — E785 Hyperlipidemia, unspecified: Secondary | ICD-10-CM | POA: Insufficient documentation

## 2020-10-11 DIAGNOSIS — M5136 Other intervertebral disc degeneration, lumbar region: Secondary | ICD-10-CM | POA: Diagnosis not present

## 2020-10-11 DIAGNOSIS — M1990 Unspecified osteoarthritis, unspecified site: Secondary | ICD-10-CM | POA: Insufficient documentation

## 2020-10-11 DIAGNOSIS — Z923 Personal history of irradiation: Secondary | ICD-10-CM | POA: Diagnosis not present

## 2020-10-11 DIAGNOSIS — Z87891 Personal history of nicotine dependence: Secondary | ICD-10-CM | POA: Diagnosis not present

## 2020-10-11 DIAGNOSIS — C541 Malignant neoplasm of endometrium: Secondary | ICD-10-CM

## 2020-10-11 DIAGNOSIS — G709 Myoneural disorder, unspecified: Secondary | ICD-10-CM | POA: Insufficient documentation

## 2020-10-11 DIAGNOSIS — Z8542 Personal history of malignant neoplasm of other parts of uterus: Secondary | ICD-10-CM

## 2020-10-11 DIAGNOSIS — Z90722 Acquired absence of ovaries, bilateral: Secondary | ICD-10-CM | POA: Insufficient documentation

## 2020-10-11 DIAGNOSIS — Z79899 Other long term (current) drug therapy: Secondary | ICD-10-CM | POA: Insufficient documentation

## 2020-10-11 DIAGNOSIS — Z90710 Acquired absence of both cervix and uterus: Secondary | ICD-10-CM

## 2020-10-11 DIAGNOSIS — G8929 Other chronic pain: Secondary | ICD-10-CM | POA: Diagnosis not present

## 2020-10-11 NOTE — Patient Instructions (Signed)
Please notify Dr Alphonsus Doyel at phone number [phone] if you notice vaginal bleeding, new pelvic or abdominal pains, bloating, feeling full easy, or a change in bladder or bowel function.    Please contact Dr Alexandria Shiflett's office (at [phone]) in or after April, 2022 to request an appointment with her for July, 2022.  

## 2020-10-12 ENCOUNTER — Telehealth: Payer: Self-pay | Admitting: *Deleted

## 2020-10-12 NOTE — Telephone Encounter (Signed)
Received a message from patient.  Called patient back and she is so anxious to get her surgery scheduled.  She was told by Dr. Barry Dienes that it might be next Thursday.  I have sent a message to [facility] for an update and to reach out to the patient.

## 2020-10-13 ENCOUNTER — Other Ambulatory Visit: Payer: Self-pay

## 2020-10-13 ENCOUNTER — Other Ambulatory Visit: Payer: Self-pay | Admitting: General Surgery

## 2020-10-13 ENCOUNTER — Encounter: Payer: Self-pay | Admitting: *Deleted

## 2020-10-13 ENCOUNTER — Encounter (HOSPITAL_BASED_OUTPATIENT_CLINIC_OR_DEPARTMENT_OTHER): Payer: Self-pay | Admitting: General Surgery

## 2020-10-13 DIAGNOSIS — Z170 Estrogen receptor positive status [ER+]: Secondary | ICD-10-CM

## 2020-10-13 DIAGNOSIS — C50412 Malignant neoplasm of upper-outer quadrant of left female breast: Secondary | ICD-10-CM

## 2020-10-17 ENCOUNTER — Encounter (HOSPITAL_BASED_OUTPATIENT_CLINIC_OR_DEPARTMENT_OTHER)
Admission: RE | Admit: 2020-10-17 | Discharge: 2020-10-17 | Disposition: A | Discharge status: Home / Self Care | Payer: Medicare Other | Source: Ambulatory Visit | Attending: General Surgery | Admitting: General Surgery

## 2020-10-17 ENCOUNTER — Other Ambulatory Visit (HOSPITAL_COMMUNITY)
Admission: RE | Admit: 2020-10-17 | Discharge: 2020-10-17 | Disposition: A | Discharge status: Home / Self Care | Payer: Medicare Other | Source: Ambulatory Visit | Attending: General Surgery | Admitting: General Surgery

## 2020-10-17 DIAGNOSIS — Z20822 Contact with and (suspected) exposure to covid-19: Secondary | ICD-10-CM | POA: Insufficient documentation

## 2020-10-17 DIAGNOSIS — Z01810 Encounter for preprocedural cardiovascular examination: Secondary | ICD-10-CM | POA: Diagnosis not present

## 2020-10-17 DIAGNOSIS — Z01812 Encounter for preprocedural laboratory examination: Secondary | ICD-10-CM | POA: Insufficient documentation

## 2020-10-17 LAB — SARS CORONAVIRUS 2 (TAT 6-24 HRS): SARS Coronavirus 2: NEGATIVE

## 2020-10-17 MED ORDER — ENSURE PRE-SURGERY PO LIQD: 296.0000 mL | Freq: Once | ORAL | Status: DC

## 2020-10-17 NOTE — Progress Notes (Signed)

## 2020-10-19 ENCOUNTER — Encounter (HOSPITAL_BASED_OUTPATIENT_CLINIC_OR_DEPARTMENT_OTHER)
Admission: RE | Disposition: A | Discharge status: Home / Self Care | Payer: Self-pay | Source: Home / Self Care | Attending: General Surgery

## 2020-10-19 ENCOUNTER — Ambulatory Visit (HOSPITAL_BASED_OUTPATIENT_CLINIC_OR_DEPARTMENT_OTHER): Payer: Medicare Other | Admitting: Anesthesiology

## 2020-10-19 ENCOUNTER — Ambulatory Visit
Admission: RE | Admit: 2020-10-19 | Discharge: 2020-10-19 | Disposition: A | Discharge status: Home / Self Care | Payer: Medicare Other | Source: Ambulatory Visit | Attending: General Surgery | Admitting: General Surgery

## 2020-10-19 ENCOUNTER — Ambulatory Visit (HOSPITAL_COMMUNITY)
Admission: RE | Admit: 2020-10-19 | Discharge: 2020-10-19 | Disposition: A | Discharge status: Home / Self Care | Payer: Medicare Other | Source: Ambulatory Visit | Attending: General Surgery | Admitting: General Surgery

## 2020-10-19 ENCOUNTER — Other Ambulatory Visit: Payer: Self-pay

## 2020-10-19 ENCOUNTER — Ambulatory Visit (HOSPITAL_BASED_OUTPATIENT_CLINIC_OR_DEPARTMENT_OTHER)
Admission: RE | Admit: 2020-10-19 | Discharge: 2020-10-19 | Disposition: A | Discharge status: Home / Self Care | Payer: Medicare Other | Attending: General Surgery | Admitting: General Surgery

## 2020-10-19 ENCOUNTER — Encounter (HOSPITAL_BASED_OUTPATIENT_CLINIC_OR_DEPARTMENT_OTHER): Payer: Self-pay | Admitting: General Surgery

## 2020-10-19 ENCOUNTER — Other Ambulatory Visit: Payer: Self-pay | Admitting: General Surgery

## 2020-10-19 DIAGNOSIS — I10 Essential (primary) hypertension: Secondary | ICD-10-CM | POA: Diagnosis not present

## 2020-10-19 DIAGNOSIS — C50912 Malignant neoplasm of unspecified site of left female breast: Secondary | ICD-10-CM | POA: Diagnosis not present

## 2020-10-19 DIAGNOSIS — C50412 Malignant neoplasm of upper-outer quadrant of left female breast: Secondary | ICD-10-CM

## 2020-10-19 DIAGNOSIS — Z170 Estrogen receptor positive status [ER+]: Secondary | ICD-10-CM | POA: Insufficient documentation

## 2020-10-19 DIAGNOSIS — Z8542 Personal history of malignant neoplasm of other parts of uterus: Secondary | ICD-10-CM | POA: Insufficient documentation

## 2020-10-19 DIAGNOSIS — Z79811 Long term (current) use of aromatase inhibitors: Secondary | ICD-10-CM | POA: Insufficient documentation

## 2020-10-19 DIAGNOSIS — G8918 Other acute postprocedural pain: Secondary | ICD-10-CM | POA: Diagnosis not present

## 2020-10-19 HISTORY — PX: BREAST LUMPECTOMY: SHX2

## 2020-10-19 HISTORY — DX: Anxiety disorder, unspecified: F41.9

## 2020-10-19 HISTORY — PX: BREAST LUMPECTOMY WITH RADIOACTIVE SEED AND SENTINEL LYMPH NODE BIOPSY: SHX6550

## 2020-10-19 SURGERY — BREAST LUMPECTOMY WITH RADIOACTIVE SEED AND SENTINEL LYMPH NODE BIOPSY
Anesthesia: General | Site: Breast | Laterality: Left

## 2020-10-19 MED ORDER — CEFAZOLIN SODIUM-DEXTROSE 2-4 GM/100ML-% IV SOLN
INTRAVENOUS | Status: AC
  Filled 2020-10-19: qty 100

## 2020-10-19 MED ORDER — FENTANYL CITRATE (PF) 100 MCG/2ML IJ SOLN
100.0000 ug | Freq: Once | INTRAMUSCULAR | Status: AC
  Administered 2020-10-19: 50 ug via INTRAVENOUS

## 2020-10-19 MED ORDER — PROPOFOL 500 MG/50ML IV EMUL
INTRAVENOUS | Status: DC | PRN
  Administered 2020-10-19: 15 ug/kg/min via INTRAVENOUS

## 2020-10-19 MED ORDER — FENTANYL CITRATE (PF) 100 MCG/2ML IJ SOLN
INTRAMUSCULAR | Status: AC
  Filled 2020-10-19: qty 2

## 2020-10-19 MED ORDER — CHLORHEXIDINE GLUCONATE CLOTH 2 % EX PADS: 6.0000 | MEDICATED_PAD | Freq: Once | CUTANEOUS | Status: DC

## 2020-10-19 MED ORDER — PROPOFOL 10 MG/ML IV BOLUS
INTRAVENOUS | Status: AC
  Filled 2020-10-19: qty 20

## 2020-10-19 MED ORDER — CEFAZOLIN SODIUM-DEXTROSE 2-4 GM/100ML-% IV SOLN
2.0000 g | INTRAVENOUS | Status: AC
  Administered 2020-10-19: 2 g via INTRAVENOUS

## 2020-10-19 MED ORDER — FENTANYL CITRATE (PF) 100 MCG/2ML IJ SOLN
25.0000 ug | INTRAMUSCULAR | Status: DC | PRN
  Administered 2020-10-19: 50 ug via INTRAVENOUS

## 2020-10-19 MED ORDER — MIDAZOLAM HCL 2 MG/2ML IJ SOLN
INTRAMUSCULAR | Status: AC
  Filled 2020-10-19: qty 2

## 2020-10-19 MED ORDER — BUPIVACAINE-EPINEPHRINE (PF) 0.25% -1:200000 IJ SOLN
INTRAMUSCULAR | Status: DC | PRN
  Administered 2020-10-19: 30 mL

## 2020-10-19 MED ORDER — SODIUM CHLORIDE (PF) 0.9 % IJ SOLN
INTRAVENOUS | Status: DC | PRN
  Administered 2020-10-19: 5 mL via INTRAMUSCULAR

## 2020-10-19 MED ORDER — AMISULPRIDE (ANTIEMETIC) 5 MG/2ML IV SOLN: 10.0000 mg | Freq: Once | INTRAVENOUS | Status: DC | PRN

## 2020-10-19 MED ORDER — MIDAZOLAM HCL 5 MG/5ML IJ SOLN
INTRAMUSCULAR | Status: DC | PRN
  Administered 2020-10-19: 1 mg via INTRAVENOUS

## 2020-10-19 MED ORDER — OXYCODONE HCL 5 MG PO TABS
ORAL_TABLET | ORAL | Status: AC
  Filled 2020-10-19: qty 1

## 2020-10-19 MED ORDER — DEXAMETHASONE SODIUM PHOSPHATE 10 MG/ML IJ SOLN
INTRAMUSCULAR | Status: AC
  Filled 2020-10-19: qty 1

## 2020-10-19 MED ORDER — LIDOCAINE 2% (20 MG/ML) 5 ML SYRINGE
INTRAMUSCULAR | Status: AC
  Filled 2020-10-19: qty 5

## 2020-10-19 MED ORDER — FENTANYL CITRATE (PF) 100 MCG/2ML IJ SOLN
INTRAMUSCULAR | Status: DC | PRN
  Administered 2020-10-19: 50 ug via INTRAVENOUS

## 2020-10-19 MED ORDER — OXYCODONE HCL 5 MG PO TABS
5.0000 mg | ORAL_TABLET | Freq: Once | ORAL | Status: AC
Start: 2020-10-19 — End: 2020-10-19
  Administered 2020-10-19: 5 mg via ORAL

## 2020-10-19 MED ORDER — ONDANSETRON HCL 4 MG/2ML IJ SOLN
INTRAMUSCULAR | Status: DC | PRN
  Administered 2020-10-19: 4 mg via INTRAVENOUS

## 2020-10-19 MED ORDER — PROPOFOL 10 MG/ML IV BOLUS
INTRAVENOUS | Status: DC | PRN
  Administered 2020-10-19: 150 mg via INTRAVENOUS

## 2020-10-19 MED ORDER — ONDANSETRON HCL 4 MG/2ML IJ SOLN
INTRAMUSCULAR | Status: AC
  Filled 2020-10-19: qty 2

## 2020-10-19 MED ORDER — ACETAMINOPHEN 500 MG PO TABS
ORAL_TABLET | ORAL | Status: AC
  Filled 2020-10-19: qty 2

## 2020-10-19 MED ORDER — PHENYLEPHRINE 40 MCG/ML (10ML) SYRINGE FOR IV PUSH (FOR BLOOD PRESSURE SUPPORT)
PREFILLED_SYRINGE | INTRAVENOUS | Status: AC
  Filled 2020-10-19: qty 10

## 2020-10-19 MED ORDER — MIDAZOLAM HCL 2 MG/2ML IJ SOLN
2.0000 mg | Freq: Once | INTRAMUSCULAR | Status: AC
  Administered 2020-10-19: 1 mg via INTRAVENOUS

## 2020-10-19 MED ORDER — EPHEDRINE 5 MG/ML INJ
INTRAVENOUS | Status: AC
  Filled 2020-10-19: qty 10

## 2020-10-19 MED ORDER — LACTATED RINGERS IV SOLN: INTRAVENOUS | Status: DC

## 2020-10-19 MED ORDER — LIDOCAINE HCL (CARDIAC) PF 100 MG/5ML IV SOSY
PREFILLED_SYRINGE | INTRAVENOUS | Status: DC | PRN
  Administered 2020-10-19: 10 mg via INTRAVENOUS

## 2020-10-19 MED ORDER — SUCCINYLCHOLINE CHLORIDE 200 MG/10ML IV SOSY
PREFILLED_SYRINGE | INTRAVENOUS | Status: AC
  Filled 2020-10-19: qty 10

## 2020-10-19 MED ORDER — TECHNETIUM TC 99M TILMANOCEPT KIT
1.0000 | PACK | Freq: Once | INTRAVENOUS | Status: AC | PRN
  Administered 2020-10-19: 1 via INTRADERMAL

## 2020-10-19 MED ORDER — OXYCODONE HCL 5 MG PO TABS: 5.0000 mg | ORAL_TABLET | Freq: Four times a day (QID) | ORAL | 0 refills | Status: DC | PRN

## 2020-10-19 MED ORDER — ACETAMINOPHEN 500 MG PO TABS
1000.0000 mg | ORAL_TABLET | ORAL | Status: AC
  Administered 2020-10-19: 1000 mg via ORAL

## 2020-10-19 SURGICAL SUPPLY — 66 items
ADH SKN CLS APL DERMABOND .7 (GAUZE/BANDAGES/DRESSINGS) ×1
APL PRP STRL LF DISP 70% ISPRP (MISCELLANEOUS) ×1
BINDER BREAST LRG (GAUZE/BANDAGES/DRESSINGS) IMPLANT
BINDER BREAST MEDIUM (GAUZE/BANDAGES/DRESSINGS) ×1 IMPLANT
BINDER BREAST XLRG (GAUZE/BANDAGES/DRESSINGS) IMPLANT
BINDER BREAST XXLRG (GAUZE/BANDAGES/DRESSINGS) IMPLANT
BLADE SURG 10 STRL SS (BLADE) ×2 IMPLANT
BLADE SURG 15 STRL LF DISP TIS (BLADE) ×1 IMPLANT
BLADE SURG 15 STRL SS (BLADE) ×2
BNDG COHESIVE 4X5 TAN STRL (GAUZE/BANDAGES/DRESSINGS) ×2 IMPLANT
CANISTER SUC SOCK COL 7IN (MISCELLANEOUS) IMPLANT
CANISTER SUCT 1200ML W/VALVE (MISCELLANEOUS) ×2 IMPLANT
CHLORAPREP W/TINT 26 (MISCELLANEOUS) ×2 IMPLANT
CLIP VESOCCLUDE LG 6/CT (CLIP) ×2 IMPLANT
CLIP VESOCCLUDE MED 6/CT (CLIP) ×4 IMPLANT
CLIP VESOCCLUDE SM WIDE 6/CT (CLIP) IMPLANT
COVER MAYO STAND STRL (DRAPES) ×4 IMPLANT
COVER PROBE W GEL 5X96 (DRAPES) ×2 IMPLANT
COVER WAND RF STERILE (DRAPES) IMPLANT
DECANTER SPIKE VIAL GLASS SM (MISCELLANEOUS) IMPLANT
DERMABOND ADVANCED (GAUZE/BANDAGES/DRESSINGS) ×1
DERMABOND ADVANCED .7 DNX12 (GAUZE/BANDAGES/DRESSINGS) ×1 IMPLANT
DRAPE UTILITY XL STRL (DRAPES) ×2 IMPLANT
DRSG PAD ABDOMINAL 8X10 ST (GAUZE/BANDAGES/DRESSINGS) ×2 IMPLANT
ELECT COATED BLADE 2.86 ST (ELECTRODE) ×2 IMPLANT
ELECT REM PT RETURN 9FT ADLT (ELECTROSURGICAL) ×2
ELECTRODE REM PT RTRN 9FT ADLT (ELECTROSURGICAL) ×1 IMPLANT
GAUZE SPONGE 4X4 12PLY STRL LF (GAUZE/BANDAGES/DRESSINGS) ×2 IMPLANT
GLOVE SURG ENC MOIS LTX SZ6 (GLOVE) ×1 IMPLANT
GLOVE SURG UNDER POLY LF SZ6.5 (GLOVE) ×3 IMPLANT
GLOVE SURG UNDER POLY LF SZ7 (GLOVE) ×2 IMPLANT
GOWN STRL REUS W/ TWL LRG LVL3 (GOWN DISPOSABLE) ×1 IMPLANT
GOWN STRL REUS W/ TWL XL LVL3 (GOWN DISPOSABLE) IMPLANT
GOWN STRL REUS W/TWL 2XL LVL3 (GOWN DISPOSABLE) ×2 IMPLANT
GOWN STRL REUS W/TWL LRG LVL3 (GOWN DISPOSABLE) ×4
GOWN STRL REUS W/TWL XL LVL3 (GOWN DISPOSABLE) ×2
KIT MARKER MARGIN INK (KITS) ×2 IMPLANT
LIGHT WAVEGUIDE WIDE FLAT (MISCELLANEOUS) ×1 IMPLANT
NDL HYPO 25X1 1.5 SAFETY (NEEDLE) ×1 IMPLANT
NDL SAFETY ECLIPSE 18X1.5 (NEEDLE) ×1 IMPLANT
NEEDLE HYPO 18GX1.5 SHARP (NEEDLE)
NEEDLE HYPO 25X1 1.5 SAFETY (NEEDLE) ×4 IMPLANT
NS IRRIG 1000ML POUR BTL (IV SOLUTION) ×2 IMPLANT
PACK BASIN DAY SURGERY FS (CUSTOM PROCEDURE TRAY) ×2 IMPLANT
PACK UNIVERSAL I (CUSTOM PROCEDURE TRAY) ×2 IMPLANT
PENCIL SMOKE EVACUATOR (MISCELLANEOUS) ×2 IMPLANT
SLEEVE SCD COMPRESS KNEE MED (MISCELLANEOUS) ×2 IMPLANT
SPONGE LAP 18X18 RF (DISPOSABLE) ×4 IMPLANT
STAPLER VISISTAT 35W (STAPLE) IMPLANT
STOCKINETTE IMPERVIOUS LG (DRAPES) ×2 IMPLANT
STRIP CLOSURE SKIN 1/2X4 (GAUZE/BANDAGES/DRESSINGS) ×2 IMPLANT
SUT ETHILON 2 0 FS 18 (SUTURE) IMPLANT
SUT MNCRL AB 4-0 PS2 18 (SUTURE) ×2 IMPLANT
SUT MON AB 5-0 PS2 18 (SUTURE) IMPLANT
SUT SILK 2 0 SH (SUTURE) IMPLANT
SUT VIC AB 2-0 SH 27 (SUTURE)
SUT VIC AB 2-0 SH 27XBRD (SUTURE) ×1 IMPLANT
SUT VIC AB 3-0 SH 27 (SUTURE)
SUT VIC AB 3-0 SH 27X BRD (SUTURE) ×1 IMPLANT
SUT VICRYL 3-0 CR8 SH (SUTURE) ×2 IMPLANT
SYR BULB EAR ULCER 3OZ GRN STR (SYRINGE) ×2 IMPLANT
SYR CONTROL 10ML LL (SYRINGE) ×3 IMPLANT
TOWEL GREEN STERILE FF (TOWEL DISPOSABLE) ×2 IMPLANT
TRAY FAXITRON CT DISP (TRAY / TRAY PROCEDURE) ×2 IMPLANT
TUBE CONNECTING 20X1/4 (TUBING) ×2 IMPLANT
YANKAUER SUCT BULB TIP NO VENT (SUCTIONS) ×2 IMPLANT

## 2020-10-19 NOTE — Progress Notes (Signed)
Assisted Dr. Oren Bracket with left, ultrasound guided, pectoralis block. Side rails up, monitors on throughout procedure. See vital signs in flow sheet. Tolerated Procedure well.

## 2020-10-19 NOTE — Interval H&P Note (Signed)
History and Physical Interval Note:  10/19/2020 11:17 AM  Dawn Powers  has presented today for surgery, with the diagnosis of LEFT BREAST CANCER.  The various methods of treatment have been discussed with the patient and family. After consideration of risks, benefits and other options for treatment, the patient has consented to  Procedure(s) with comments: LEFT BREAST LUMPECTOMY WITH RADIOACTIVE SEED AND SENTINEL LYMPH NODE BIOPSY (Left) - PEC BLOCK, RNFA as a surgical intervention.  The patient's history has been reviewed, patient examined, no change in status, stable for surgery.  I have reviewed the patient's chart and labs.  Questions were answered to the patient's satisfaction.     Stark Klein

## 2020-10-19 NOTE — Anesthesia Procedure Notes (Signed)
Anesthesia Regional Block: Pectoralis block   Pre-Anesthetic Checklist: ,, timeout performed, Correct Patient, Correct Site, Correct Laterality, Correct Procedure, Correct Position, site marked, Risks and benefits discussed,  Surgical consent,  Pre-op evaluation,  At surgeon's request and post-op pain management  Laterality: Left  Prep: chloraprep       Needles:  Injection technique: Single-shot  Needle Type: Echogenic Needle     Needle Length: 9cm  Needle Gauge: 21     Additional Needles:   Procedures:,,,, ultrasound used (permanent image in chart),,,,  Narrative:  Start time: 10/19/2020 11:55 AM End time: 10/19/2020 12:00 PM Injection made incrementally with aspirations every 5 mL.  Performed by: Personally  Anesthesiologist: Suzette Battiest, MD

## 2020-10-19 NOTE — Op Note (Signed)
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 The risks, benefits, complications, treatment options, and expected outcomes were discussed with the patient. The possibilities of bleeding, infection, the need for additional procedures, failure to diagnose a condition, and creating a complication requiring transfusion or operation were discussed with the patient. The patient concurred with the proposed plan, giving informed consent.  The site of surgery properly noted/marked. The patient was taken to Operating Room # 1, identified, and the procedure verified as Left Breast Seed localized Lumpectomy with sentinel lymph node biopsy. A Time Out was held and the above information confirmed.  Methylene blue was injected in the subareolar location  The left arm, breast, and chest were prepped and draped in standard fashion. The lumpectomy was performed by creating a lateral incision near the previously placed radioactive seed.  Dissection was carried down to around the point of maximum signal intensity. The cautery was used to perform the dissection.  Hemostasis was achieved with cautery. The edges of the cavity were marked with large clips, with one each medial, lateral, inferior and superior, and two clips posteriorly.   The specimen was inked with the margin marker paint kit.    Specimen radiography confirmed inclusion of the mammographic lesion, the clip, and the seed.  The background signal in the breast was zero.  The wound was irrigated and closed with 3-0 vicryl  in layers and 4-0 monocryl subcuticular suture.    Using a hand-held gamma probe, left axillary sentinel nodes were identified transcutaneously.  An oblique incision was created below the axillary hairline.  Dissection was carried through the clavipectoral fascia.  Three deep level 2 axillary sentinel nodes were removed.  Counts per second were 0881, 90, and 445.    The background count was 12 cps.  The wound was irrigated.  Hemostasis was achieved with cautery.  The axillary incision was closed with a 3-0 vicryl deep dermal interrupted sutures and a 4-0 monocryl subcuticular closure.    Sterile dressings were applied. At the end of the operation, all sponge, instrument, and needle counts were correct.  Findings: grossly clear surgical margins and no adenopathy, anterior margin is skin, posterior margin is pectoralis  Estimated Blood Loss:  min         Specimens: left breast tissue with seed and Three left axillary sentinel lymph nodes.             Complications:  None; patient tolerated the procedure well.         Disposition: PACU - hemodynamically stable.         Condition: stable

## 2020-10-19 NOTE — H&P (Signed)
[name] Appointment: 10/09/2020 1:45 PM Location: [facility] Patient #: [id number] DOB: March 17, 1946 Married / Language: Cleophus Molt / Race: White Female   History of Present Illness Stark Klein MD; 10/09/2020 2:49 PM) The patient is a 75 year old female who presents with breast cancer. Pt is a lovely 75 yo F with a new diagnosis of left breast cancer 08/2020. She had a screening detected left breast mass that once she knew about it, was palpable. Diagnostic imaging was performed. This showed a 1.1 cm irregular hypoechoic mass at 3 o'clock. A core needle biopsy was performed. This showed an invasive mammary carcinoma, grade 3, ductal phenotype. Prognostic panel is +/+/-, Ki 67 10%.   Of note, this week is her 5 year anniversary visit after uterine cancer treated by Dr. Denman George. She does have a family cancer history with prostate cancer in brother and father and colon cancer in her father. She has a sister with melanoma. She has seen Dr. Jana Hakim and has already started on anastrozole.   Films are at [facility].   Dx mammo/us 09/06/2020 ACR Breast Density Category b: There are scattered areas of fibroglandular density.  FINDINGS: 3D tomographic and 2D generated spot compression images of the left breast confirm an irregular mass with mild architectural distortion in the posterior aspect of the outer left breast.  On physical exam, there is an approximately 1 cm oval, mildly tender palpable mass in the 3 o'clock position of the left breast, 6 cm from the nipple.  Targeted ultrasound is performed, showing a 1.1 x 1.0 x 0.7 cm oval, horizontally oriented hypoechoic mass with faint central calcifications in the 3 o'clock position of the left breast, 6 cm from the nipple. This has predominantly indistinct and slightly irregular margins and no increased or decreased through transmission of sound. This corresponds to the mammographic mass.  Ultrasound of the left axilla  demonstrated normal appearing left axillary lymph nodes.  IMPRESSION: 1.1 cm mass with imaging features highly suspicious malignancy in the 3 o'clock position of the left breast.  RECOMMENDATION: Ultrasound-guided core needle biopsy of the 1.1 cm mass in the 3 o'clock position of the left breast. This has been discussed with the patient and scheduled at 7:30 a.m. on 09/20/2020.  I have discussed the findings and recommendations with the patient. If applicable, a reminder letter will be sent to the patient regarding the next appointment.  BI-RADS CATEGORY 5: Highly suggestive of malignancy.  pathology 09/19/2020 Breast, left, needle core biopsy, left breast 3:00 - INVASIVE MAMMARY CARCINOMA - SEE COMMENT Microscopic Comment Based on the biopsy, the carcinoma appears Nottingham grade 3 of 3 and measures 1.9 cm in greatest linear extent. -cadherin is POSITIVE supporting a ductal origin. Estrogen Receptor: 95%, POSITIVE, STRONG STAINING INTENSITY Progesterone Receptor: 10%, POSITIVE, STRONG STAINING INTENSITY Proliferation Marker Ki67: 10% GROUP 5: HER2 **NEGATIVE**       Past Surgical History Emeline Gins, CMA; 10/09/2020 1:47 PM) Breast Biopsy  Left. Hysterectomy (due to cancer) - Complete  Shoulder Surgery  Left. Tonsillectomy   Diagnostic Studies History Emeline Gins, [place]; 10/09/2020 1:47 PM) Colonoscopy  5-10 years ago Mammogram  within last year Pap Smear  >5 years ago  Allergies Emeline Gins, [place]; 10/09/2020 1:50 PM) Morphine Derivatives  Allergies Reconciled   Medication History Emeline Gins, CMA; 10/09/2020 1:50 PM) Anastrozole (1MG  Tablet, Oral) Active. traZODone HCl (50MG  Tablet, Oral) Active. Losartan Potassium (50MG  Tablet, Oral) Active. HYDROcodone-Acetaminophen (5-325MG  Tablet, Oral) Active. Medications Reconciled  Social History Emeline Gins, [place]; 10/09/2020  1:47 PM) Alcohol use  Moderate alcohol use. Caffeine use   Coffee. No drug use  Tobacco use  Former smoker.  Family History Emeline Gins, [place]; 10/09/2020 1:47 PM) Alcohol Abuse  Brother, Father, Mother, Sister, Son. Arthritis  Brother, Daughter, Mother, Sister. Colon Cancer  Father. Depression  Son. Heart Disease  Brother. Heart disease in female family member before age 53  Hypertension  Brother, Sister. Ischemic Bowel Disease  Son. Kidney Disease  Brother. Melanoma  Sister. Prostate Cancer  Brother, Father. Respiratory Condition  Father, Sister. Thyroid problems  Mother.  Pregnancy / Birth History Emeline Gins, [place]; 10/09/2020 1:47 PM) Age at menarche  50 years. Age of menopause  51-55 Contraceptive History  Oral contraceptives. Gravida  4 Length (months) of breastfeeding  7-12 Maternal age  26-30 Para  3  Other Problems Emeline Gins, [place]; 10/09/2020 1:47 PM) Anxiety Disorder  Arthritis  Back Pain  Breast Cancer  Cancer  Heart murmur  Lump In Breast  Oophorectomy     Review of Systems Emeline Gins CMA; 10/09/2020 1:47 PM) General Not Present- Appetite Loss, Chills, Fatigue, Fever, Night Sweats, Weight Gain and Weight Loss. Skin Not Present- Change in Wart/Mole, Dryness, Hives, Jaundice, New Lesions, Non-Healing Wounds, Rash and Ulcer. HEENT Present- Wears glasses/contact lenses. Not Present- Earache, Hearing Loss, Hoarseness, Nose Bleed, Oral Ulcers, Ringing in the Ears, Seasonal Allergies, Sinus Pain, Sore Throat, Visual Disturbances and Yellow Eyes. Respiratory Not Present- Bloody sputum, Chronic Cough, Difficulty Breathing, Snoring and Wheezing. Breast Present- Breast Mass. Not Present- Breast Pain, Nipple Discharge and Skin Changes. Cardiovascular Present- Leg Cramps. Not Present- Chest Pain, Difficulty Breathing Lying Down, Palpitations, Rapid Heart Rate, Shortness of Breath and Swelling of Extremities. Gastrointestinal Not Present- Abdominal Pain, Bloating, Bloody Stool, Change in  Bowel Habits, Chronic diarrhea, Constipation, Difficulty Swallowing, Excessive gas, Gets full quickly at meals, Hemorrhoids, Indigestion, Nausea, Rectal Pain and Vomiting. Female Genitourinary Present- Nocturia. Not Present- Frequency, Painful Urination, Pelvic Pain and Urgency. Musculoskeletal Present- Back Pain. Not Present- Joint Pain, Joint Stiffness, Muscle Pain, Muscle Weakness and Swelling of Extremities. Neurological Not Present- Decreased Memory, Fainting, Headaches, Numbness, Seizures, Tingling, Tremor, Trouble walking and Weakness. Psychiatric Not Present- Anxiety, Bipolar, Change in Sleep Pattern, Depression, Fearful and Frequent crying. Endocrine Not Present- Cold Intolerance, Excessive Hunger, Hair Changes, Heat Intolerance, Hot flashes and New Diabetes. Hematology Not Present- Blood Thinners, Easy Bruising, Excessive bleeding, Gland problems, HIV and Persistent Infections.  Vitals Emeline Gins CMA; 10/09/2020 1:48 PM) 10/09/2020 1:48 PM Weight: 124 lb Height: 64in Body Surface Area: 1.6 m Body Mass Index: 21.28 kg/m  Temp.: 46F  Pulse: 70 (Regular)  BP: 154/76(Sitting, Left Arm, Standard)       Physical Exam Stark Klein MD; 10/09/2020 2:51 PM) General Mental Status-Alert. General Appearance-Consistent with stated age. Hydration-Well hydrated. Voice-Normal.  Head and Neck Head-normocephalic, atraumatic with no lesions or palpable masses. Trachea-midline. Thyroid Gland Characteristics - normal size and consistency.  Eye Eyeball - Bilateral-Extraocular movements intact. Sclera/Conjunctiva - Bilateral-No scleral icterus.  Chest and Lung Exam Chest and lung exam reveals -quiet, even and easy respiratory effort with no use of accessory muscles and on auscultation, normal breath sounds, no adventitious sounds and normal vocal resonance. Inspection Chest Wall - Normal. Back - normal.  Breast Note: breasts are symmetric  bilaterally. She has minimal ptosis. Left breast has a palpable mass at 3 o'clock that is around 1-1.5 cm and mobile. There is no LAD. She has no nipple retraction or skin dimpling. No nipple discharge. Breasts are relatively  dense.   Cardiovascular Cardiovascular examination reveals -normal heart sounds, regular rate and rhythm with no murmurs and normal pedal pulses bilaterally.  Abdomen Inspection Inspection of the abdomen reveals - No Hernias. Palpation/Percussion Palpation and Percussion of the abdomen reveal - Soft, Non Tender, No Rebound tenderness, No Rigidity (guarding) and No hepatosplenomegaly. Auscultation Auscultation of the abdomen reveals - Bowel sounds normal.  Neurologic Neurologic evaluation reveals -alert and oriented x 3 with no impairment of recent or remote memory. Mental Status-Normal.  Musculoskeletal Global Assessment -Note: no gross deformities.  Normal Exam - Left-Upper Extremity Strength Normal and Lower Extremity Strength Normal. Normal Exam - Right-Upper Extremity Strength Normal and Lower Extremity Strength Normal.  Lymphatic Head & Neck  General Head & Neck Lymphatics: Bilateral - Description - Normal. Axillary  General Axillary Region: Bilateral - Description - Normal. Tenderness - Non Tender. Femoral & Inguinal  Generalized Femoral & Inguinal Lymphatics: Bilateral - Description - No Generalized lymphadenopathy.    Assessment & Plan Stark Klein MD; 10/09/2020 2:52 PM) MALIGNANT NEOPLASM OF UPPER-OUTER QUADRANT OF LEFT BREAST IN FEMALE, ESTROGEN RECEPTOR POSITIVE (C50.412) Impression: Pt has a new diagnosis of cT1cN0 left breast cancer, hormone receptor positive. Given high grade nature and patient's good health, will plan seed localized lumpectomy and SLN bx.  This would be followed by oncotype and likely radiation therapy. She is on anastrozole already and doesn't need to hold this.  The surgical procedure was described to  the patient. I discussed the incision type and location and that we would need radiology involved on with a wire or seed marker and/or sentinel node.  The risks and benefits of the procedure were described to the patient and she wishes to proceed.  We discussed the risks bleeding, infection, damage to other structures, need for further procedures/surgeries. We discussed the risk of seroma. The patient was advised if the area in the breast in cancer, we may need to go back to surgery for additional tissue to obtain negative margins or for a lymph node biopsy. The patient was advised that these are the most common complications, but that others can occur as well. They were advised against taking aspirin or other anti-inflammatory agents/blood thinners the week before surgery. Current Plans You are being scheduled for surgery- Our schedulers will call you.  You should hear from our office's scheduling department within 5 working days about the location, date, and time of surgery. We try to make accommodations for patient's preferences in scheduling surgery, but sometimes the OR schedule or the surgeon's schedule prevents [place] from making those accommodations.  If you have not heard from our office [phone]) in 5 working days, call the office and ask for your surgeon's nurse.  If you have other questions about your diagnosis, plan, or surgery, call the office and ask for your surgeon's nurse.  Pt Education - [facility] breast cancer surgery: discussed with patient and provided information.   Signed electronically by Stark Klein, MD (10/09/2020 2:52 PM)

## 2020-10-19 NOTE — Discharge Instructions (Addendum)
[place] Office Phone Number [phone]  BREAST BIOPSY/ PARTIAL MASTECTOMY: POST OP INSTRUCTIONS  Always review your discharge instruction sheet given to you by the facility where your surgery was performed.  IF YOU HAVE DISABILITY OR FAMILY LEAVE FORMS, YOU MUST BRING THEM TO THE OFFICE FOR PROCESSING.  DO NOT GIVE THEM TO YOUR DOCTOR.  1. A prescription for pain medication may be given to you upon discharge.  Take your pain medication as prescribed, if needed.  If narcotic pain medicine is not needed, then you may take acetaminophen (Tylenol) or ibuprofen (Advil) as needed. *No Tylenol until after 5:45pm  2. Take your usually prescribed medications unless otherwise directed 3. If you need a refill on your pain medication, please contact your pharmacy.  They will contact our office to request authorization.  Prescriptions will not be filled after 5pm or on week-ends. 4. You should eat very light the first 24 hours after surgery, such as soup, crackers, pudding, etc.  Resume your normal diet the day after surgery. 5. Most patients will experience some swelling and bruising in the breast.  Ice packs and a good support bra will help.  Swelling and bruising can take several days to resolve.  6. It is common to experience some constipation if taking pain medication after surgery.  Increasing fluid intake and taking a stool softener will usually help or prevent this problem from occurring.  A mild laxative (Milk of Magnesia or Miralax) should be taken according to package directions if there are no bowel movements after 48 hours. 7. Unless discharge instructions indicate otherwise, you may remove your bandages 48 hours after surgery, and you may shower at that time.  You may have steri-strips (small skin tapes) in place directly over the incision.  These strips should be left on the skin for 7-10 days.   Any sutures or staples will be removed at the office during your follow-up  visit. 8. ACTIVITIES:  You may resume regular daily activities (gradually increasing) beginning the next day.  Wearing a good support bra or sports bra (or the breast binder) minimizes pain and swelling.  You may have sexual intercourse when it is comfortable. a. You may drive when you no longer are taking prescription pain medication, you can comfortably wear a seatbelt, and you can safely maneuver your car and apply brakes. b. RETURN TO WORK:  __________1 week_______________ 9. You should see your doctor in the office for a follow-up appointment approximately two weeks after your surgery.  Your doctor's nurse will typically make your follow-up appointment when she calls you with your pathology report.  Expect your pathology report 2-3 business days after your surgery.  You may call to check if you do not hear from [place] after three days.   WHEN TO CALL YOUR DOCTOR: 1. Fever over 101.0 2. Nausea and/or vomiting. 3. Extreme swelling or bruising. 4. Continued bleeding from incision. 5. Increased pain, redness, or drainage from the incision.  The clinic staff is available to answer your questions during regular business hours.  Please don't hesitate to call and ask to speak to one of the nurses for clinical concerns.  If you have a medical emergency, go to the nearest emergency room or call 911.  A surgeon from [facility] is always on call at the hospital.  For further questions, please visit [email or web address]    Post Anesthesia Home Care Instructions  Activity: Get plenty of rest for the remainder of the day. A responsible individual  must stay with you for 24 hours following the procedure.  For the next 24 hours, DO NOT: -Drive a car -Paediatric nurse -Drink alcoholic beverages -Take any medication unless instructed by your physician -Make any legal decisions or sign important papers.  Meals: Start with liquid foods such as gelatin or soup. Progress to regular foods  as tolerated. Avoid greasy, spicy, heavy foods. If nausea and/or vomiting occur, drink only clear liquids until the nausea and/or vomiting subsides. Call your physician if vomiting continues.  Special Instructions/Symptoms: Your throat may feel dry or sore from the anesthesia or the breathing tube placed in your throat during surgery. If this causes discomfort, gargle with warm salt water. The discomfort should disappear within 24 hours.  If you had a scopolamine patch placed behind your ear for the management of post- operative nausea and/or vomiting:  1. The medication in the patch is effective for 72 hours, after which it should be removed.  Wrap patch in a tissue and discard in the trash. Wash hands thoroughly with soap and water. 2. You may remove the patch earlier than 72 hours if you experience unpleasant side effects which may include dry mouth, dizziness or visual disturbances. 3. Avoid touching the patch. Wash your hands with soap and water after contact with the patch.

## 2020-10-19 NOTE — Transfer of Care (Signed)
Immediate Anesthesia Transfer of Care Note  Patient: Dawn Powers  Procedure(s) Performed: LEFT BREAST LUMPECTOMY WITH RADIOACTIVE SEED AND SENTINEL LYMPH NODE BIOPSY (Left Breast)  Patient Location: PACU  Anesthesia Type:GA combined with regional for post-op pain  Level of Consciousness: awake, drowsy and patient cooperative  Airway & Oxygen Therapy: Patient Spontanous Breathing and Patient connected to face mask oxygen  Post-op Assessment: Report given to RN and Post -op Vital signs reviewed and stable  Post vital signs: Reviewed and stable  Last Vitals:  Vitals Value Taken Time  BP    Temp    Pulse 72 10/19/20 1610  Resp 0 10/19/20 1610  SpO2 100 % 10/19/20 1610  Vitals shown include unvalidated device data.  Last Pain:  Vitals:   10/19/20 1125  TempSrc: Oral  PainSc: 0-No pain      Patients Stated Pain Goal: 3 (43/15/40 0867)  Complications: No complications documented.

## 2020-10-19 NOTE — Anesthesia Procedure Notes (Signed)
Procedure Name: LMA Insertion Date/Time: 10/19/2020 2:33 PM Performed by: Willa Frater, CRNA Pre-anesthesia Checklist: Patient identified, Emergency Drugs available, Suction available and Patient being monitored Patient Re-evaluated:Patient Re-evaluated prior to induction Oxygen Delivery Method: Circle system utilized Preoxygenation: Pre-oxygenation with 100% oxygen Induction Type: IV induction Ventilation: Mask ventilation without difficulty LMA: LMA inserted LMA Size: 3.0 Number of attempts: 1 Airway Equipment and Method: Bite block Placement Confirmation: positive ETCO2 Tube secured with: Tape Dental Injury: Teeth and Oropharynx as per pre-operative assessment

## 2020-10-19 NOTE — Anesthesia Preprocedure Evaluation (Signed)
Anesthesia Evaluation  Patient identified by MRN, date of birth, ID band Patient awake    Reviewed: Allergy & Precautions, NPO status , Patient's Chart, lab work & pertinent test results  Airway Mallampati: II  TM Distance: >3 FB Neck ROM: Full    Dental  (+) Dental Advisory Given   Pulmonary former smoker,    breath sounds clear to auscultation       Cardiovascular hypertension, Pt. on medications  Rhythm:Regular Rate:Normal     Neuro/Psych  Neuromuscular disease    GI/Hepatic negative GI ROS, Neg liver ROS,   Endo/Other  negative endocrine ROS  Renal/GU negative Renal ROS     Musculoskeletal  (+) Arthritis ,   Abdominal   Peds  Hematology negative hematology ROS (+)   Anesthesia Other Findings   Reproductive/Obstetrics                             Anesthesia Physical Anesthesia Plan  ASA: II  Anesthesia Plan: General   Post-op Pain Management:  Regional for Post-op pain   Induction: Intravenous  PONV Risk Score and Plan: 3 and Dexamethasone, Ondansetron and Treatment may vary due to age or medical condition  Airway Management Planned: LMA  Additional Equipment:   Intra-op Plan:   Post-operative Plan: Extubation in OR  Informed Consent: I have reviewed the patients History and Physical, chart, labs and discussed the procedure including the risks, benefits and alternatives for the proposed anesthesia with the patient or authorized representative who has indicated his/her understanding and acceptance.     Dental advisory given  Plan Discussed with: CRNA  Anesthesia Plan Comments:         Anesthesia Quick Evaluation

## 2020-10-20 ENCOUNTER — Encounter (HOSPITAL_BASED_OUTPATIENT_CLINIC_OR_DEPARTMENT_OTHER): Payer: Self-pay | Admitting: General Surgery

## 2020-10-20 NOTE — Anesthesia Postprocedure Evaluation (Signed)
Anesthesia Post Note  Patient: Dawn Powers  Procedure(s) Performed: LEFT BREAST LUMPECTOMY WITH RADIOACTIVE SEED AND SENTINEL LYMPH NODE BIOPSY (Left Breast)     Patient location during evaluation: PACU Anesthesia Type: General Level of consciousness: awake and alert Pain management: pain level controlled Vital Signs Assessment: post-procedure vital signs reviewed and stable Respiratory status: spontaneous breathing, nonlabored ventilation, respiratory function stable and patient connected to nasal cannula oxygen Cardiovascular status: blood pressure returned to baseline and stable Postop Assessment: no apparent nausea or vomiting Anesthetic complications: no   No complications documented.  Last Vitals:  Vitals:   10/19/20 1632 10/19/20 1650  BP: (!) 152/62 (!) 180/72  Pulse: 66 63  Resp: 12 16  Temp:  36.7 C  SpO2: 99% 98%    Last Pain:  Vitals:   10/20/20 0928  TempSrc:   PainSc: 4                  Tiajuana Amass

## 2020-10-23 LAB — SURGICAL PATHOLOGY

## 2020-10-24 ENCOUNTER — Encounter: Payer: Self-pay | Admitting: *Deleted

## 2020-10-24 ENCOUNTER — Telehealth: Payer: Self-pay | Admitting: *Deleted

## 2020-10-24 NOTE — Telephone Encounter (Signed)
Received order for oncotype testing. Requisition faxed to pathology and [facility] °

## 2020-10-30 ENCOUNTER — Ambulatory Visit: Payer: Medicare Other | Attending: Oncology

## 2020-10-30 ENCOUNTER — Other Ambulatory Visit: Payer: Self-pay | Admitting: *Deleted

## 2020-10-30 ENCOUNTER — Other Ambulatory Visit: Payer: Self-pay

## 2020-10-30 DIAGNOSIS — Z483 Aftercare following surgery for neoplasm: Secondary | ICD-10-CM | POA: Diagnosis not present

## 2020-10-30 DIAGNOSIS — C50412 Malignant neoplasm of upper-outer quadrant of left female breast: Secondary | ICD-10-CM | POA: Diagnosis not present

## 2020-10-30 DIAGNOSIS — R293 Abnormal posture: Secondary | ICD-10-CM | POA: Diagnosis not present

## 2020-10-30 DIAGNOSIS — Z170 Estrogen receptor positive status [ER+]: Secondary | ICD-10-CM

## 2020-10-30 DIAGNOSIS — R600 Localized edema: Secondary | ICD-10-CM

## 2020-10-30 DIAGNOSIS — M25512 Pain in left shoulder: Secondary | ICD-10-CM

## 2020-10-30 NOTE — Therapy (Signed)
[facility] [address] [place], [place], [address] Phone: [phone]   Fax:  [phone]  Physical Therapy Evaluation  Patient Details  Name: Dawn Powers MRN: [id number] Date of Birth: 09/25/1946 Referring Provider (PT): Dr. Magrinat   Encounter Date: 10/30/2020   PT End of Session - 10/30/20 1624    Visit Number 1    Number of Visits 12    Date for PT Re-Evaluation 12/11/20    Authorization Type [facility]    Authorization - Number of Visits 10    PT Start Time 1500    PT Stop Time 1555    PT Time Calculation (min) 55 min    Activity Tolerance Patient tolerated treatment well    Behavior During Therapy WFL for tasks assessed/performed           Past Medical History:  Diagnosis Date  . Anxiety   . Arthritis   . Breast cancer (HCC) 09/19/2020   left breast IMC  . Cancer (HCC)    Endometrial   . Heart murmur   . Hyperlipidemia   . Hypertension   . Neuromuscular disorder (HCC)    DDD  . Pain    lower back -hx "DDD"  . Radiation 11/15/15, 11/23/15, 11/30/15, 12/07/15, 12/14/15   HDR brachytherapy    Past Surgical History:  Procedure Laterality Date  . ABDOMINAL HYSTERECTOMY    . BREAST LUMPECTOMY WITH RADIOACTIVE SEED AND SENTINEL LYMPH NODE BIOPSY Left 10/19/2020   Procedure: LEFT BREAST LUMPECTOMY WITH RADIOACTIVE SEED AND SENTINEL LYMPH NODE BIOPSY;  Surgeon: Byerly, Faera, MD;  Location: [facility];  Service: General;  Laterality: Left;  PEC BLOCK, RNFA  . COLONOSCOPY    . FRACTURE SURGERY Right    ankle  . GANGLION CYST EXCISION    . LYMPH NODE BIOPSY N/A 09/26/2015   Procedure: SENTINEL LYMPH NODE BIOPSY;  Surgeon: Emma Rossi, MD;  Location: [facility];  Service: Gynecology;  Laterality: N/A;  . ROBOTIC ASSISTED TOTAL HYSTERECTOMY WITH BILATERAL SALPINGO OOPHERECTOMY Bilateral 09/26/2015   Procedure: ROBOTIC ASSISTED TOTAL HYSTERECTOMY WITH BILATERAL SALPINGO OOPHORECTOMY;  Surgeon: Emma Rossi, MD;   Location: [facility];  Service: Gynecology;  Laterality: Bilateral;  . TOTAL SHOULDER ARTHROPLASTY Left 09/10/2018   Procedure: TOTAL SHOULDER ARTHROPLASTY;  Surgeon: Supple, Kevin, MD;  Location: [facility];  Service: Orthopedics;  Laterality: Left;  120min  . WISDOM TOOTH EXTRACTION      There were no vitals filed for this visit.    Subjective Assessment - 10/30/20 1501    Subjective Pt. presents to PT s/p Left Breast Lumpectomy with SLNB on 10/19/2020.  She was diagnosed during a routine mammogram.  She has a history of Edometrial CA. with hysterectomy in 2016 and radiation in 2017. She had 3 LN removed.  Breast and under arm is swollen, and feels numb at medial arm.  Has some tenderness and difficulty sleeping. Pt is right handed and has not been using her left arm much. she has a left TSA from 2019  Active with Yoga and Qi Gong, Tai Chi.  Also plays the recorder and flute.    Pertinent History Pt. presents to PT s/p Left Breast Lumpectomy with SLNB on 10/19/2020.  She was diagnosed during a routine mammogram.  She has a history of Edometrial CA. with hysterectomy in 2016 and radiation in 2017. She had 3 LN removed.  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  She has not had pathology report   yet.  She has a left TSA    Patient Stated Goals Check breast, axilla for swelling, ROM    Currently in Pain? Yes    Pain Score 5     Pain Location Axilla   breast and lateral trunk   Pain Orientation Left    Pain Descriptors / Indicators Tender    Pain Type Surgical pain    Pain Onset 1 to 4 weeks ago    Pain Frequency Intermittent    Aggravating Factors  disturbed sleep, using arm    Pain Relieving Factors rest    Effect of Pain on Daily Activities limits use of left UE    Multiple Pain Sites No   but does get back spasms usually in the am for which she takes hydrocodone             [facility] PT Assessment - 10/30/20 0001      Assessment   Medical Diagnosis Left Breast Cancer     Referring Provider (PT) Dr. Magrinat    Onset Date/Surgical Date 10/19/20    Hand Dominance Right    Prior Therapy yes   Left TSA, back     Precautions   Precautions --   Lymphedema risk, prior endometrial CA, Left TSA, back spasms     Restrictions   Weight Bearing Restrictions No      Balance Screen   Has the patient fallen in the past 6 months No    Has the patient had a decrease in activity level because of a fear of falling?  No    Is the patient reluctant to leave their home because of a fear of falling?  No      Home Environment   Living Environment Private residence    Living Arrangements Spouse/significant other    Available Help at Discharge Family      Prior Function   Level of Independence Independent    Vocation Retired    Leisure enjoys music, walking, yoga, Tai Chi, reading      Cognition   Overall Cognitive Status Within Functional Limits for tasks assessed      Observation/Other Assessments   Observations steri strips present over breast and axillary incision,      Observation/Other Assessments-Edema    Edema --   Edema right axillary region, upper arm at axilla measurement     Sensation   Light Touch Impaired by gross assessment   at medial upper arm and lateral trunk     Posture/Postural Control   Posture/Postural Control Postural limitations    Postural Limitations Rounded Shoulders;Forward head      AROM   Overall AROM Comments Left shoulder limited secondary to TSA and has to lower arm with flexed elbow    Left Shoulder Extension 50 Degrees    Left Shoulder Flexion 120 Degrees   Lowers with elbow flexed   Left Shoulder ABduction 108 Degrees             LYMPHEDEMA/ONCOLOGY QUESTIONNAIRE - 10/30/20 0001      Surgeries   Lumpectomy Date 10/19/20    Sentinel Lymph Node Biopsy Date 10/19/20    Number Lymph Nodes Removed 3      Treatment   Past Chemotherapy Treatment No    Past Radiation Treatment Yes    Date --   2017   Current Hormone  Treatment Yes    Drug Name anastrazole    Past Hormone Therapy Yes      What other symptoms do   you have   Are you Having Heaviness or Tightness Yes    Are you having Pain Yes    Are you having pitting edema No    Is it Hard or Difficult finding clothes that fit No    Do you have infections No      Right Upper Extremity Lymphedema   At Axilla  29.5 cm    15 cm Proximal to Olecranon Process 28.5 cm    10 cm Proximal to Olecranon Process 26.3 cm    Olecranon Process 22.5 cm    15 cm Proximal to Ulnar Styloid Process 20 cm    10 cm Proximal to Ulnar Styloid Process 17.3 cm    Just Proximal to Ulnar Styloid Process 14.1 cm    Across Hand at Thumb Web Space 19 cm    At Base of 2nd Digit 5.9 cm      Left Upper Extremity Lymphedema   At Axilla  31 cm    15 cm Proximal to Olecranon Process 28.5 cm    10 cm Proximal to Olecranon Process 26.3 cm    Olecranon Process 22.5 cm    15 cm Proximal to Ulnar Styloid Process 20 cm    10 cm Proximal to Ulnar Styloid Process 17.3 cm    Just Proximal to Ulnar Styloid Process 14 cm    Across Hand at Thumb Web Space 18 cm    At Base of 2nd Digit 5.9 cm                   Objective measurements completed on examination: See above findings.       [facility] Adult PT Treatment/Exercise - 10/30/20 0001      Shoulder Exercises: Supine   Other Supine Exercises Gentle AA shoulder flex x 4, Star gazer x 4      Shoulder Exercises: Standing   Retraction 5 reps    Other Standing Exercises wall climb x 2      Manual Therapy   Edema Management chip pack made for left axillary region and bra pulled up further to cover area of swelling. Pt to remove in an hour or 2 to assess, and may continue to use if comfortable.  Pt may try one of her sports bras to see if a little more compression will help.                  PT Education - 10/30/20 1621    Education Details Pt was educated in supine AA shoulder flex and ER with hands behind head,  sitting or standing scapular retraction, and standing gentle wall slides.  She was also set up for ABC class on Feb 21.  chip pack was made for edema control and pt was educated in its use as well as wearing it in a sports type bra.    Person(s) Educated Patient    Methods Explanation;Demonstration;Handout    Comprehension Verbalized understanding            PT Short Term Goals - 10/30/20 1632      PT SHORT TERM GOAL #1   Title Pt will be independent with HEP for left shoulder ROM/ strength    Time 4    Period Weeks    Status New             PT Long Term Goals - 10/30/20 1633      PT LONG TERM GOAL #1   Title Pt will be independent in self   Manual Lymph drainage to decrease swelling    Time 4    Period Weeks    Status New    Target Date 11/27/20      PT LONG TERM GOAL #2   Title Pt will be fit for compression bra/sleeve prn    Time 4    Period Weeks    Status New    Target Date 11/27/20      PT LONG TERM GOAL #3   Title Left shoulder AROM returned to normal pre surgery ROM per pt. (has left TSA)    Time 6    Period Weeks    Status New    Target Date 12/04/20                  Plan - 10/30/20 1625    Clinical Impression Statement Pt is s/p left breast lumpectomy with SLNB on 10/19/20.  She presents today with concerns of swelling in left axillary region and upper arm, and limitations in ROM.  She has a left TSA, and had prior endometrial CA.  She is awaiting pathology results regarding further treatment.  She was educated in 4 post op exercises to perform gently, and a chip pack was made for left axillary region.  We also discussed trying a sports bra at home to hold pack in better.  She will likely benefit from a compression bra.    Personal Factors and Comorbidities Comorbidity 3+    Comorbidities left breast CA with SLNB, prior endometrial CA, Left TSA, Back spasms    Stability/Clinical Decision Making Evolving/Moderate complexity    Rehab Potential  Excellent    PT Frequency 2x / week    PT Duration 6 weeks    PT Treatment/Interventions ADLs/Self Care Home Management;Therapeutic activities;Therapeutic exercise;Neuromuscular re-education;Manual techniques;Patient/family education;Orthotic Fit/Training;Manual lymph drainage;Passive range of motion;Scar mobilization    PT Next Visit Plan assess benefit of chip pack, MLD to left upper arm and axillary region of swelling and instruct pt, review post op exercises(has left TSA but does well with ROM), consider compression bra    PT Home Exercise Plan 4 post op exs    Recommended Other Services compression bra/sleeve prn    Consulted and Agree with Plan of Care Patient           Patient will benefit from skilled therapeutic intervention in order to improve the following deficits and impairments:  Decreased mobility,Decreased strength,Increased edema,Impaired sensation,Postural dysfunction,Decreased scar mobility,Pain,Decreased skin integrity,Decreased knowledge of precautions,Decreased activity tolerance,Impaired UE functional use,Decreased range of motion  Visit Diagnosis: Malignant neoplasm of upper-outer quadrant of left breast in female, estrogen receptor positive (HCC)  Localized edema  Abnormal posture  Aftercare following surgery for neoplasm  Left shoulder pain, unspecified chronicity     Problem List Patient Active Problem List   Diagnosis Date Noted  . Malignant neoplasm of upper-outer quadrant of left breast in female, estrogen receptor positive ([place]) 09/21/2020  . Prediabetes 07/02/2019  . 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 Endometrial cancer ([place]) 09/26/2015  . Essential hypertension 05/23/2015  . Thoracic facet joint syndrome 04/27/2014  . Back pain 03/24/2014  . Muscle spasm 03/24/2014  . Scoliosis (and kyphoscoliosis), idiopathic 12/22/2012  . Osteopenia 12/22/2012  . Other and unspecified hyperlipidemia 12/22/2012    Claris Pong 10/30/2020,  4:38 PM  [place], [place], [address] Phone: [phone]   Fax:  [phone]  Name: BRIELLAH BAIK MRN: [id number] Date of Birth: 1946-06-15 Shirlean Mylar  [place], PT 10/30/20 4:41 PM  

## 2020-10-30 NOTE — Patient Instructions (Signed)
Patient was instructed today in a home exercise program today for post op shoulder range of motion. These included active assist shoulder flexion in supine, scapular retraction, wall walking with shoulder abduction, and hands behind head external rotation in supine.  She was encouraged to do these twice a day, holding 3 seconds and repeating 5 times.  She was given illustrated and written instructions. Pt will also try chip pack in bra to decrease axillary edema

## 2020-10-31 ENCOUNTER — Telehealth: Payer: Self-pay | Admitting: Oncology

## 2020-10-31 ENCOUNTER — Telehealth: Payer: Self-pay | Admitting: *Deleted

## 2020-10-31 NOTE — Telephone Encounter (Signed)
Left message to verify mychart video visit for pre reg 

## 2020-10-31 NOTE — Telephone Encounter (Signed)
Received call from patient regarding some trouble with her mychart and logging in. Gave her the contact number for mychart to call and have them help her with the issue.  She states she also has some swelling in her axilla about the size of a golf ball. Explained to her that she may some fluid (seroma) in that area and to give her surgeon's office a call and see if she needs to be seen.  Patient verbalized understanding.

## 2020-11-01 ENCOUNTER — Inpatient Hospital Stay: Payer: Medicare Other | Attending: Oncology | Admitting: Oncology

## 2020-11-01 DIAGNOSIS — Z170 Estrogen receptor positive status [ER+]: Secondary | ICD-10-CM

## 2020-11-01 DIAGNOSIS — C541 Malignant neoplasm of endometrium: Secondary | ICD-10-CM | POA: Diagnosis not present

## 2020-11-01 DIAGNOSIS — C50412 Malignant neoplasm of upper-outer quadrant of left female breast: Secondary | ICD-10-CM

## 2020-11-01 NOTE — Progress Notes (Signed)
[place]  [phone] [phone]     ID: Dawn Powers DOB: 14-May-1946  MR#: [id number]  NAT#:[id number]  Patient Care Team: Darreld Mclean, MD as PCP - General (Family Medicine) Nadav Swindell, Virgie Dad, MD as Consulting Physician (Oncology) Stark Klein, MD as Consulting Physician (General Surgery) Everitt Amber, MD as Consulting Physician (Gynecologic Oncology) Gery Pray, MD as Consulting Physician (Radiation Oncology) Cheri Fowler, MD as Consulting Physician (Obstetrics and Gynecology) Rockwell Germany, RN as Nurse Navigator Tressie Ellis, Paulette Blanch, RN as Nurse Navigator Chauncey Cruel, MD OTHER MD:  I connected with Dawn Powers on 11/01/20 at  3:45 PM EST by video enabled telemedicine visit and verified that I am speaking with the correct person using two identifiers.   I discussed the limitations, risks, security and privacy concerns of performing an evaluation and management service by telemedicine and the availability of in-person appointments. I also discussed with the patient that there may be a patient responsible charge related to this service. The patient expressed understanding and agreed to proceed.   Other persons participating in the visit and their role in the encounter: Patient's husband  Patient's location: Home Provider's location: [place]: Estrogen receptor positive breast cancer  CURRENT TREATMENT:    INTERVAL HISTORY: Dawn Powers was contacted today for follow up of her estrogen receptor positive breast cancer. She was evaluated in the breast cancer clinic on 09/21/2020.  She underwent left lumpectomy on 10/19/2020 under Dr. Barry Dienes. Pathology from the procedure ([id number]) showed: invasive ductal carcinoma, 1.2 cm, grade 2; resection margins negative.  All three biopsied lymph nodes were negative for carcinoma (0/3).   REVIEW OF SYSTEMS: Dawn Powers is me the day of surgery was very  long since she had her seed on the same day. She tells me that when she went home they gave her a binder which was extremely uncomfortable. Her daughter just today got her a pressure bra that has Velcro in the front and she finds it much more comfortable. She has a lump in her left axilla which she says feels like an egg and is the size of a golf ball. By inspection through the virtual visit it does look like a seroma. She has significant discomfort in the medial aspect of the left upper extremity but she has had no fever no drainage no bleeding. She still has the Steri-Strips in place. A detailed review of systems today was otherwise stable  COVID 19 VACCINATION STATUS: Status post Pfizer x2 with booster August 2021   HISTORY OF CURRENT ILLNESS: From the original intake note:  Dawn Powers had routine screening mammography on 08/18/2020 showing a possible abnormality in the left breast. She underwent left diagnostic mammography with tomography and left breast ultrasonography at [facility] on 09/06/2020 showing: breast density category B; palpable 1.1 cm mass at 3 o'clock; normal-appearing left axillary lymph nodes.  Accordingly on 09/19/2020 she proceeded to biopsy of the left breast area in question. The pathology from this procedure ([id number]) showed: invasive mammary carcinoma, e-cadherin positive, grade 3. Prognostic indicators significant for: estrogen receptor, 95% positive and progesterone receptor, 10% % positive, both with strong staining intensity. Proliferation marker Ki67 at 10%. HER2 equivocal by immunohistochemistry with fluorescent in situ hybridization pending  The patient's subsequent history is as detailed below.   PAST MEDICAL HISTORY: Past Medical History:  Diagnosis Date  . Anxiety   . Arthritis   . Breast cancer ([place]) 09/19/2020  left breast IMC  . Cancer [facility])    Endometrial   . Heart murmur   . Hyperlipidemia   . Hypertension   . Neuromuscular disorder  ([place])    DDD  . Pain    lower back -hx "DDD"  . Radiation 11/15/15, 11/23/15, 11/30/15, 12/07/15, 12/14/15   HDR brachytherapy    PAST SURGICAL HISTORY: Past Surgical History:  Procedure Laterality Date  . ABDOMINAL HYSTERECTOMY    . BREAST LUMPECTOMY WITH RADIOACTIVE SEED AND SENTINEL LYMPH NODE BIOPSY Left 10/19/2020   Procedure: LEFT BREAST LUMPECTOMY WITH RADIOACTIVE SEED AND SENTINEL LYMPH NODE BIOPSY;  Surgeon: Stark Klein, MD;  Location: [place];  Service: General;  Laterality: Left;  [place], RNFA  . COLONOSCOPY    . FRACTURE SURGERY Right    ankle  . GANGLION CYST EXCISION    . LYMPH NODE BIOPSY N/A 09/26/2015   Procedure: SENTINEL LYMPH NODE BIOPSY;  Surgeon: Everitt Amber, MD;  Location: [facility];  Service: Gynecology;  Laterality: N/A;  . ROBOTIC ASSISTED TOTAL HYSTERECTOMY WITH BILATERAL SALPINGO OOPHERECTOMY Bilateral 09/26/2015   Procedure: ROBOTIC ASSISTED TOTAL HYSTERECTOMY WITH BILATERAL SALPINGO OOPHORECTOMY;  Surgeon: Everitt Amber, MD;  Location: [facility];  Service: Gynecology;  Laterality: Bilateral;  . TOTAL SHOULDER ARTHROPLASTY Left 09/10/2018   Procedure: TOTAL SHOULDER ARTHROPLASTY;  Surgeon: Justice Britain, MD;  Location: [place];  Service: Orthopedics;  Laterality: Left;  136min  . WISDOM TOOTH EXTRACTION      FAMILY HISTORY: Family History  Problem Relation Age of Onset  . Heart disease Mother   . Alcohol abuse Mother        breast cancer - questionable   . Dementia Mother   . Alcohol abuse Father   . Prostate cancer Father 51       prostate and colon- unsure of primary   . Kidney cancer Brother   . Alcohol abuse Maternal Grandfather   . Alcohol abuse Sister   . Esophageal cancer Neg Hx   . Stomach cancer Neg Hx   . Rectal cancer Neg Hx   The patient's father died with prostate cancer at the age of 64.  The patient's mother died from heart disease at the age of 46.  She had had a mastectomy for unclear reasons.  The patient has 1 sister in  good health and 2 brothers 1 of whom has had prostate cancer   GYNECOLOGIC HISTORY:  No LMP recorded. Patient has had a hysterectomy. Menarche: 75 years old Age at first live birth: 75 years old GX P 3 LMP 58 HRT about 2 years Hysterectomy? Yes, 08/2015 for endometrial cancer BSO? 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  Her husband Dawn Powers is a retired Administrator, teaching in the modern languages in film department.  At home it is just the 2 of them plus their hound dog.  Daughter Dawn Powers "the librarian" works at [employer].  Second child Dawn Powers died at age 30.  Daughter Dawn Powers lives in [place].  The patient has 2 grandchildren and attends [place]    ADVANCED DIRECTIVES: In the absence of any documentation to the contrary, the patient's spouse is their HCPOA.    HEALTH MAINTENANCE: Social History   Tobacco Use  . Smoking status: Former Smoker    Years: 4.00    Types: Cigarettes    Quit date: 09/30/1998    Years since quitting: 22.1  . Smokeless tobacco: Never Used  Vaping Use  .  Vaping Use: Never used  Substance Use Topics  . Alcohol use: Yes    Alcohol/week: 3.0 standard drinks    Types: 3 Standard drinks or equivalent per week    Comment: 5 drinks per week  . Drug use: No     Colonoscopy: 08/2015 (Dr. Leone Payor), repeat not indicated due to age  PAP: Status post hysterectomy  Bone density: 08/2009, -2.0   Allergies  Allergen Reactions  . Morphine And Related Hives    Current Outpatient Medications  Medication Sig Dispense Refill  . anastrozole (ARIMIDEX) 1 MG tablet Take 1 tablet (1 mg total) by mouth daily. 90 tablet 4  . HYDROcodone-acetaminophen (NORCO/VICODIN) 5-325 MG tablet TAKE 1 OR 2 TABLETS EVERY 6 HOURS AS NEEDED FOR PAIN. 90 tablet 0  . losartan (COZAAR) 50 MG tablet TAKE 1 TABLET ONCE DAILY. (Patient taking differently: 25 mg.) 90 tablet 1  . oxyCODONE (OXY IR/ROXICODONE) 5 MG immediate release tablet  Take 1 tablet (5 mg total) by mouth every 6 (six) hours as needed for severe pain. 15 tablet 0  . traZODone (DESYREL) 50 MG tablet Take 1.5 tablets (75 mg total) by mouth at bedtime as needed for sleep. (Patient taking differently: Take 25 mg by mouth at bedtime as needed for sleep.) 135 tablet 1   No current facility-administered medications for this visit.    OBJECTIVE: White woman who appears younger than stated age  There were no vitals filed for this visit.   There is no height or weight on file to calculate BMI.   Wt Readings from Last 3 Encounters:  10/19/20 123 lb 3.8 oz (55.9 kg)  10/11/20 122 lb (55.3 kg)  09/21/20 121 lb 9.6 oz (55.2 kg)      ECOG FS:1 - Symptomatic but completely ambulatory  Telemedicine visit 11/01/2020  On the televisit it does appear that the incisions are not dehiscing. The Steri-Strips are in place. There is a "lump" in the left axilla which most likely is a seroma. I do not see inflammation.   LAB RESULTS:  CMP     Component Value Date/Time   NA 140 07/31/2020 1045   K 4.4 07/31/2020 1045   CL 101 07/31/2020 1045   CO2 29 07/31/2020 1045   GLUCOSE 95 07/31/2020 1045   BUN 14 07/31/2020 1045   CREATININE 0.97 (H) 07/31/2020 1045   CALCIUM 10.0 07/31/2020 1045   PROT 7.3 07/31/2020 1045   ALBUMIN 4.5 07/01/2019 0734   AST 21 07/31/2020 1045   ALT 13 07/31/2020 1045   ALKPHOS 62 07/01/2019 0734   BILITOT 0.7 07/31/2020 1045   GFRNONAA >60 09/03/2018 0934   GFRNONAA 70 04/11/2016 1200   GFRAA >60 09/03/2018 0934   GFRAA 80 04/11/2016 1200    Lab Results  Component Value Date   TOTALPROTELP 6.5 07/20/2014   TOTALPROTELP 6.5 07/20/2014   ALBUMINELP 63.3 07/20/2014   A1GS 4.7 07/20/2014   A2GS 11.4 07/20/2014   BETS 5.7 07/20/2014   BETA2SER 4.2 07/20/2014   GAMS 10.7 (L) 07/20/2014   MSPIKE NOT DET 07/20/2014   SPEI SEE NOTE 07/20/2014    Lab Results  Component Value Date   WBC 7.5 07/31/2020   NEUTROABS 2.5 08/14/2018   HGB  14.1 07/31/2020   HCT 41.8 07/31/2020   MCV 96.3 07/31/2020   PLT 288 07/31/2020    No results found for: LABCA2  No components found for: VRSVPP979  No results for input(s): INR in the last 168 hours.  No results found  for: LABCA2  No results found for: DVZ924  No results found for: POJ654  No results found for: URB566  No results found for: CA2729  No components found for: HGQUANT  No results found for: CEA1 / No results found for: CEA1   No results found for: AFPTUMOR  No results found for: CHROMOGRNA  No results found for: KPAFRELGTCHN, LAMBDASER, KAPLAMBRATIO (kappa/lambda light chains)  No results found for: HGBA, HGBA2QUANT, HGBFQUANT, HGBSQUAN (Hemoglobinopathy evaluation)   No results found for: LDH  No results found for: IRON, TIBC, IRONPCTSAT (Iron and TIBC)  No results found for: FERRITIN  Urinalysis    Component Value Date/Time   COLORURINE YELLOW 09/19/2015 1400   APPEARANCEUR CLEAR 09/19/2015 1400   LABSPEC 1.023 09/19/2015 1400   PHURINE 7.0 09/19/2015 1400   GLUCOSEU NEGATIVE 09/19/2015 1400   HGBUR NEGATIVE 09/19/2015 1400   BILIRUBINUR negative 11/06/2016 1411   BILIRUBINUR neg 05/23/2015 1003   KETONESUR negative 11/06/2016 1411   KETONESUR NEGATIVE 09/19/2015 1400   PROTEINUR negative 11/06/2016 1411   PROTEINUR NEGATIVE 09/19/2015 1400   UROBILINOGEN negative 11/06/2016 1411   NITRITE Negative 11/06/2016 1411   NITRITE NEGATIVE 09/19/2015 1400   LEUKOCYTESUR Negative 11/06/2016 1411     STUDIES: NM Sentinel Node Inj-No Rpt (Breast)  Result Date: 10/19/2020 Sulfur colloid was injected by the nuclear medicine technologist for melanoma sentinel node.   MM Breast Surgical Specimen  Result Date: 10/19/2020 CLINICAL DATA:  Evaluate surgical specimen following lumpectomy for LEFT breast cancer. EXAM: SPECIMEN RADIOGRAPH OF THE LEFT BREAST COMPARISON:  Previous exam(s). FINDINGS: Status post excision of the LEFT breast. The  radioactive seed and RIBBON biopsy marker clip are present and completely intact. IMPRESSION: Specimen radiograph of the LEFT breast. Electronically Signed   By: Harmon Pier M.D.   On: 10/19/2020 15:49   [place] LT RADIOACTIVE SEED LOC  Result Date: 10/19/2020 CLINICAL DATA:  Patient for preoperative localization prior to left lumpectomy. EXAM: ULTRASOUND GUIDED RADIOACTIVE SEED LOCALIZATION OF THE LEFT BREAST COMPARISON:  Previous exam(s). FINDINGS: Patient presents for radioactive seed localization prior to left lumpectomy. I met with the patient and we discussed the procedure of seed localization including benefits and alternatives. We discussed the high likelihood of a successful procedure. We discussed the risks of the procedure including infection, bleeding, tissue injury and further surgery. We discussed the low dose of radioactivity involved in the procedure. Informed, written consent was given. The usual time-out protocol was performed immediately prior to the procedure. Using ultrasound guidance, sterile technique, 1% lidocaine and an I-125 radioactive seed, left breast mass was localized using a lateral approach. The follow-up mammogram images confirm the seed in the expected location and were marked for Dr. Donell Beers. Follow-up survey of the patient confirms presence of the radioactive seed. Order number of I-125 seed:  [id number]. Total activity:  0.248 millicuries reference Date: 10/03/2020 The patient tolerated the procedure well and was [facility] from the [facility]. She was given instructions regarding seed removal. IMPRESSION: Radioactive seed localization left breast. No apparent complications. Electronically Signed   By: Annia Belt M.D.   On: 10/19/2020 08:34   MM CLIP PLACEMENT LEFT  Result Date: 10/19/2020 CLINICAL DATA:  Patient status post seed for localization prior to left lumpectomy. EXAM: DIAGNOSTIC LEFT MAMMOGRAM POST ULTRASOUND-GUIDED RADIOACTIVE SEED PLACEMENT COMPARISON:  Previous  exam(s). FINDINGS: Mammographic images were obtained following ultrasound-guided radioactive seed placement. These demonstrate radioactive seed to be located within the left breast mass adjacent to the ribbon clip. IMPRESSION: Appropriate location  of the radioactive seed. Final Assessment: Post Procedure Mammograms for Seed Placement Electronically Signed   By: Lovey Newcomer M.D.   On: 10/19/2020 08:34     ELIGIBLE FOR AVAILABLE RESEARCH PROTOCOL: AET  ASSESSMENT: 75 y.o. [place] woman status post right breast upper outer quadrant biopsy 09/19/2020 for a clinical T1c N0, stage IA invasive ductal carcinoma, grade 3, estrogen and progesterone receptor positive, with an MIB-1 of 10% and HER-2 equivocal with FISH pending  (1) genetics testing pending  (2) status post left lumpectomy and sentinel lymph node sampling 10/19/2020 for a pT1c pN0, stage IA invasive ductal carcinoma, grade 2, with negative margins  (3) Oncotype will be obtained from the definitive surgical sample  (4) adjuvant radiation as appropriate  (5) anastrozole started neoadjuvantly 09/21/2020   PLAN:  Anjolaoluwa did well with her surgery except for concerns regarding a left seroma. I have sent a note to Dr. Barry Dienes to see if the patient can be seen in the next couple of days to get that evaluated and likely drained.  I reassured Dora that the numbness and discomfort she is feeling in the left axilla and left medial upper extremity is common, does not have to be permanent, and does not mean there is any major issue. Hopefully with time all those feelings will improve but frequently there are shooting pains soreness and sensitivities as well as numbness which can last quite a while.  For pain she has been using hydrocodone because of a history of back pain. We discussed using Aleve plus Tylenol and see if that solves the issue for her  As soon as we have the Oncotype results we will know if the next step is chemotherapy or radiation.  We discussed that today. I am not making a return appointment at this point since I do not know whether that should be in a week or in 5weeks.  The plan is to continue anastrozole for a total of 5 years.  Encounter time 25 minutes.Sarajane Jews C. Marina Desire, MD 11/01/2020 4:03 PM Medical Oncology and Hematology [facility] [place], [place] [address] Tel. [phone]    Fax. [phone]   This document serves as a record of services personally performed by Lurline Del, MD. It was created on his behalf by Wilburn Mylar, a trained medical scribe. The creation of this record is based on the scribe's personal observations and the provider's statements to them.   I, Lurline Del MD, have reviewed the above documentation for accuracy and completeness, and I agree with the above.   *Total Encounter Time as defined by the Centers for Medicare and Medicaid Services includes, in addition to the face-to-face time of a patient visit (documented in the note above) non-face-to-face time: obtaining and reviewing outside history, ordering and reviewing medications, tests or procedures, care coordination (communications with other health care professionals or caregivers) and documentation in the medical record.

## 2020-11-02 ENCOUNTER — Other Ambulatory Visit: Payer: Self-pay

## 2020-11-02 ENCOUNTER — Telehealth: Payer: Self-pay | Admitting: *Deleted

## 2020-11-02 ENCOUNTER — Ambulatory Visit: Payer: Medicare Other | Attending: Oncology

## 2020-11-02 ENCOUNTER — Encounter: Payer: Self-pay | Admitting: *Deleted

## 2020-11-02 DIAGNOSIS — Z483 Aftercare following surgery for neoplasm: Secondary | ICD-10-CM | POA: Diagnosis not present

## 2020-11-02 DIAGNOSIS — Z170 Estrogen receptor positive status [ER+]: Secondary | ICD-10-CM | POA: Diagnosis not present

## 2020-11-02 DIAGNOSIS — R293 Abnormal posture: Secondary | ICD-10-CM | POA: Diagnosis not present

## 2020-11-02 DIAGNOSIS — M25512 Pain in left shoulder: Secondary | ICD-10-CM | POA: Insufficient documentation

## 2020-11-02 DIAGNOSIS — C50412 Malignant neoplasm of upper-outer quadrant of left female breast: Secondary | ICD-10-CM | POA: Diagnosis not present

## 2020-11-02 DIAGNOSIS — R600 Localized edema: Secondary | ICD-10-CM | POA: Diagnosis not present

## 2020-11-02 NOTE — Therapy (Signed)
[place], [place], [address] Phone: [phone]   Fax:  [phone]  Physical Therapy Treatment  Patient Details  Name: JAMIN HUMPHRIES MRN: [id number] Date of Birth: 1946/07/05 Referring Provider (PT): Dr. Jana Hakim   Encounter Date: 11/02/2020   PT End of Session - 11/02/20 1125    Visit Number 2    Number of Visits 12    Date for PT Re-Evaluation 12/11/20    Authorization Type MC    PT Start Time 0902    PT Stop Time 0958    PT Time Calculation (min) 56 min    Activity Tolerance Patient tolerated treatment well    Behavior During Therapy [facility] for tasks assessed/performed           Past Medical History:  Diagnosis Date  . Anxiety   . Arthritis   . Breast cancer ([place]) 09/19/2020   left breast IMC  . Cancer [facility])    Endometrial   . Heart murmur   . Hyperlipidemia   . Hypertension   . Neuromuscular disorder ([place])    DDD  . Pain    lower back -hx "DDD"  . Radiation 11/15/15, 11/23/15, 11/30/15, 12/07/15, 12/14/15   HDR brachytherapy    Past Surgical History:  Procedure Laterality Date  . ABDOMINAL HYSTERECTOMY    . BREAST LUMPECTOMY WITH RADIOACTIVE SEED AND SENTINEL LYMPH NODE BIOPSY Left 10/19/2020   Procedure: LEFT BREAST LUMPECTOMY WITH RADIOACTIVE SEED AND SENTINEL LYMPH NODE BIOPSY;  Surgeon: Stark Klein, MD;  Location: [facility];  Service: General;  Laterality: Left;  [place], RNFA  . COLONOSCOPY    . FRACTURE SURGERY Right    ankle  . GANGLION CYST EXCISION    . LYMPH NODE BIOPSY N/A 09/26/2015   Procedure: SENTINEL LYMPH NODE BIOPSY;  Surgeon: Everitt Amber, MD;  [facility];  Service: Gynecology;  Laterality: N/A;  . ROBOTIC ASSISTED TOTAL HYSTERECTOMY WITH BILATERAL SALPINGO OOPHERECTOMY Bilateral 09/26/2015   Procedure: ROBOTIC ASSISTED TOTAL HYSTERECTOMY WITH BILATERAL SALPINGO OOPHORECTOMY;  Surgeon: Everitt Amber, MD;  [facility];  Service: Gynecology;   Laterality: Bilateral;  . TOTAL SHOULDER ARTHROPLASTY Left 09/10/2018   Procedure: TOTAL SHOULDER ARTHROPLASTY;  Surgeon: Justice Britain, MD;  Location: [place];  Service: Orthopedics;  Laterality: Left;  176min  . WISDOM TOOTH EXTRACTION      There were no vitals filed for this visit.   Subjective Assessment - 11/02/20 0902    Subjective Did OK after last visit. sore.  Met with Dr. Jana Hakim yesterday and he wants me to have it drained. Dr. Barry Dienes is supposed to contact me today about having the seroma drained. Sore after last visit but no worse than usual.  Chip pack didn't change much but the pressure felt better. I am wearing the post surgical bra and it feels alot better.    Pertinent History Pt. presents to PT s/p Left Breast Lumpectomy with SLNB on 10/19/2020.  She was diagnosed during a routine mammogram.  She has a history of Edometrial CA. with hysterectomy in 2016 and radiation in 2017. She had 3 LN removed.  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  She has not had pathology report yet.  She has a left TSA    Patient Stated Goals Check breast, axilla for swelling, ROM    Currently in Pain? Yes    Pain Score 5     Pain Location Axilla  Pain Orientation Left    Pain Descriptors / Indicators Sore;Tender    Pain Type Surgical pain    Pain Onset 1 to 4 weeks ago    Pain Frequency Intermittent                             [facility] Adult PT Treatment/Exercise - 11/02/20 0001      Shoulder Exercises: Supine   Other Supine Exercises Gentle AA shoulder flex x 5, Star gazer x 5      Manual Therapy   Edema Management small TG soft cut for left UE hand to axilla.  Pt to wear for several hours but to recheck hand and make sure there is no swelling.  May wear as long as it is comfortable.  Should remove if she has discomfort.    Myofascial Release to area of cording in left axillary region    Manual Lymphatic Drainage (MLD) short neck, right axillary LN,  Left inguinal LN, anterior interaxillary anastomosis, left axillo-inguinal pathway, left lateral arm, medial to lateral arm and lateral arm retracing pathways in supine, with extra work at lateral trunk in Right SL    Passive ROM Gentle PROM left shoulder in flex, scaption, abduction, IR and ER                  PT Education - 11/02/20 1122    Education Details Discussed compression sleeve, gauntlet and compression bra with pt.  Pt will get measured tomorrow (billed to [employer]) and pt understands this is a one time grant.    Person(s) Educated Patient    Methods Explanation   signed [employer] form   Comprehension Verbalized understanding            PT Short Term Goals - 10/30/20 1632      PT SHORT TERM GOAL #1   Title Pt will be independent with HEP for left shoulder ROM/ strength    Time 4    Period Weeks    Status New             PT Long Term Goals - 11/02/20 1131      PT LONG TERM GOAL #1   Title Pt will be independent in self Manual Lymph drainage to decrease swelling    Time 4    Period Weeks    Status New    Target Date 11/27/20      PT LONG TERM GOAL #2   Title Pt will be fit for compression bra/sleeve prn    Time 4    Period Weeks    Status New    Target Date 11/27/20      PT LONG TERM GOAL #3   Title Left shoulder AROM returned to normal pre surgery ROM per pt. (has left TSA)    Time 6    Period Weeks    Status New    Target Date 12/04/20      PT LONG TERM GOAL #4   Title Left chest/trunk swelling will be reduced greater than 50% per pt and will have no discomfort    Time 4    Period Weeks    Status New    Target Date 11/30/20                 Plan - 11/02/20 1125    Clinical Impression Statement Pt continues with discomfort in axillary region/anterior chest from swelling .  She is awaiting a  call from Dr. Barry Dienes regarding draining the area.  She has mild cording noted in the axillary region today and mild swelling continues at prox upper  arm.  TG soft was cut for pt and she will wear hand to axilla as long as it is comfortable for her.  She will be measured for sleeve/ gauntlet and compression bras tomorrow.  Treatment consisted of MFR techniques to axillary region and area of cording, gentle shoulder PROM, and MLD to left Upper arm and lateral trunk area of swelling with verbalization to pt.    Personal Factors and Comorbidities Comorbidity 3+    Comorbidities left breast CA with SLNB, prior endometrial CA, Left TSA, Back spasms    Stability/Clinical Decision Making Evolving/Moderate complexity    Rehab Potential Excellent    PT Frequency 2x / week    PT Duration 6 weeks    PT Treatment/Interventions ADLs/Self Care Home Management;Therapeutic activities;Therapeutic exercise;Neuromuscular re-education;Manual techniques;Patient/family education;Orthotic Fit/Training;Manual lymph drainage;Passive range of motion;Scar mobilization    PT Next Visit Plan MFR to cording at axilla, PROM left shoulder, start to instruct pt in Self MLD, being fit for sleeve tomorrow    PT Home Exercise Plan 4 post op exs    Consulted and Agree with Plan of Care Patient           Patient will benefit from skilled therapeutic intervention in order to improve the following deficits and impairments:  Decreased mobility,Decreased strength,Increased edema,Impaired sensation,Postural dysfunction,Decreased scar mobility,Pain,Decreased skin integrity,Decreased knowledge of precautions,Decreased activity tolerance,Impaired UE functional use,Decreased range of motion  Visit Diagnosis: Localized edema  Abnormal posture  Aftercare following surgery for neoplasm  Left shoulder pain, unspecified chronicity  Malignant neoplasm of upper-outer quadrant of left breast in female, estrogen receptor positive ([place])     Problem List Patient Active Problem List   Diagnosis Date Noted  . Malignant neoplasm of upper-outer quadrant of left breast in female, estrogen  receptor positive ([place]) 09/21/2020  . Prediabetes 07/02/2019  . 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 Endometrial cancer ([place]) 09/26/2015  . Essential hypertension 05/23/2015  . Thoracic facet joint syndrome 04/27/2014  . Back pain 03/24/2014  . Muscle spasm 03/24/2014  . Scoliosis (and kyphoscoliosis), idiopathic 12/22/2012  . Osteopenia 12/22/2012  . Other and unspecified hyperlipidemia 12/22/2012    Claris Pong 11/02/2020, 11:34 AM  [facility] [place], [place], [address] Phone: [phone]   Fax:  [phone]  Name: ZANITA MILLMAN MRN: [id number] Date of Birth: [birth year] Cheral Almas, PT 11/02/20 11:37 AM

## 2020-11-02 NOTE — Telephone Encounter (Signed)
Received call from patient stating she has tried calling Dr. Marlowe Aschoff office and cannot seem to get through.  She would like to get in to see someone concerning a possible seroma.  Informed her I would call [facility] and have them give her a call.  Spoke with nurse at [facility] they will call her and get her an appointment to see Dr. Barry Dienes tomorrow morning.

## 2020-11-03 ENCOUNTER — Telehealth: Payer: Self-pay | Admitting: *Deleted

## 2020-11-03 ENCOUNTER — Encounter: Payer: Self-pay | Admitting: *Deleted

## 2020-11-03 DIAGNOSIS — Z170 Estrogen receptor positive status [ER+]: Secondary | ICD-10-CM

## 2020-11-03 DIAGNOSIS — C50412 Malignant neoplasm of upper-outer quadrant of left female breast: Secondary | ICD-10-CM

## 2020-11-03 NOTE — Telephone Encounter (Signed)
Received oncotype results of 15/4%.  Patient is aware. Referral placed for rad onc.

## 2020-11-07 ENCOUNTER — Encounter: Payer: Self-pay | Admitting: Rehabilitation

## 2020-11-07 ENCOUNTER — Ambulatory Visit: Payer: Medicare Other | Admitting: Rehabilitation

## 2020-11-07 ENCOUNTER — Other Ambulatory Visit: Payer: Self-pay

## 2020-11-07 DIAGNOSIS — C50412 Malignant neoplasm of upper-outer quadrant of left female breast: Secondary | ICD-10-CM | POA: Diagnosis not present

## 2020-11-07 DIAGNOSIS — R600 Localized edema: Secondary | ICD-10-CM

## 2020-11-07 DIAGNOSIS — R293 Abnormal posture: Secondary | ICD-10-CM

## 2020-11-07 DIAGNOSIS — Z170 Estrogen receptor positive status [ER+]: Secondary | ICD-10-CM | POA: Diagnosis not present

## 2020-11-07 DIAGNOSIS — Z483 Aftercare following surgery for neoplasm: Secondary | ICD-10-CM | POA: Diagnosis not present

## 2020-11-07 DIAGNOSIS — M25512 Pain in left shoulder: Secondary | ICD-10-CM | POA: Diagnosis not present

## 2020-11-07 NOTE — Therapy (Signed)
[place], [place], [address] Phone: [phone]   Fax:  [phone]  Physical Therapy Treatment  Patient Details  Name: Dawn Powers MRN: [id number] Date of Birth: 1946-09-21 Referring Provider (PT): Dr. Jana Hakim   Encounter Date: 11/07/2020   PT End of Session - 11/07/20 1010    Visit Number 3    Number of Visits 12    Date for PT Re-Evaluation 12/11/20    PT Start Time 0900    PT Stop Time 0955    PT Time Calculation (min) 55 min    Activity Tolerance Patient tolerated treatment well    Behavior During Therapy [facility] for tasks assessed/performed           Past Medical History:  Diagnosis Date  . Anxiety   . Arthritis   . Breast cancer ([place]) 09/19/2020   left breast IMC  . Cancer [facility])    Endometrial   . Heart murmur   . Hyperlipidemia   . Hypertension   . Neuromuscular disorder ([place])    DDD  . Pain    lower back -hx "DDD"  . Radiation 11/15/15, 11/23/15, 11/30/15, 12/07/15, 12/14/15   HDR brachytherapy    Past Surgical History:  Procedure Laterality Date  . ABDOMINAL HYSTERECTOMY    . BREAST LUMPECTOMY WITH RADIOACTIVE SEED AND SENTINEL LYMPH NODE BIOPSY Left 10/19/2020   Procedure: LEFT BREAST LUMPECTOMY WITH RADIOACTIVE SEED AND SENTINEL LYMPH NODE BIOPSY;  Surgeon: Stark Klein, MD;  Location: [place];  Service: General;  Laterality: Left;  [place], RNFA  . COLONOSCOPY    . FRACTURE SURGERY Right    ankle  . GANGLION CYST EXCISION    . LYMPH NODE BIOPSY N/A 09/26/2015   Procedure: SENTINEL LYMPH NODE BIOPSY;  Surgeon: Everitt Amber, MD;  [facility];  Service: Gynecology;  Laterality: N/A;  . ROBOTIC ASSISTED TOTAL HYSTERECTOMY WITH BILATERAL SALPINGO OOPHERECTOMY Bilateral 09/26/2015   Procedure: ROBOTIC ASSISTED TOTAL HYSTERECTOMY WITH BILATERAL SALPINGO OOPHORECTOMY;  Surgeon: Everitt Amber, MD;  [facility];  Service: Gynecology;  Laterality: Bilateral;  .  TOTAL SHOULDER ARTHROPLASTY Left 09/10/2018   Procedure: TOTAL SHOULDER ARTHROPLASTY;  Surgeon: Justice Britain, MD;  Location: [place];  Service: Orthopedics;  Laterality: Left;  136min  . WISDOM TOOTH EXTRACTION      There were no vitals filed for this visit.   Subjective Assessment - 11/07/20 0859    Subjective I got it drained last friday.  They got around 0.5 cup out and then it came back right away.  I got my bras.  I go back on the 14th to see if it needs to be drained. I didn't wake up dying for the first time this morning. I have been taking my hydrocodone in the morning. I have been doing the Qai gong nightly.    Pertinent History Pt. presents to PT s/p Left Breast Lumpectomy with SLNB on 10/19/2020.  She was diagnosed during a routine mammogram.  She has a history of Edometrial CA. with hysterectomy in 2016 and radiation in 2017. She had 3 LN removed.  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  She has not had pathology report yet.  She has a left TSA    Patient Stated Goals Check breast, axilla for swelling, ROM    Currently in Pain? Yes    Pain Score 5     Pain Location Axilla    Pain Descriptors /  Indicators Sore;Tender    Pain Type Surgical pain    Pain Onset 1 to 4 weeks ago    Pain Frequency Intermittent                             [facility] Adult PT Treatment/Exercise - 11/07/20 0001      Manual Therapy   Edema Management made 1/2" gray foam rectangle to wear high up in the axilla in bra or without for seroma compression.  Gave pt prescription for bras and information on secnd to nature.  Would like to hold on getting a sleeve as she did not like the tg soft much    Myofascial Release no cording felt today    Manual Lymphatic Drainage (MLD) short neck, bil axillary LN,sternal nodes Left inguinal LN, anterior interaxillary anastomosis, left axillo-inguinal pathway, left axilla,  and upper arm and retracing pathways in supine                     PT Short Term Goals - 10/30/20 1632      PT SHORT TERM GOAL #1   Title Pt will be independent with HEP for left shoulder ROM/ strength    Time 4    Period Weeks    Status New             PT Long Term Goals - 11/02/20 1131      PT LONG TERM GOAL #1   Title Pt will be independent in self Manual Lymph drainage to decrease swelling    Time 4    Period Weeks    Status New    Target Date 11/27/20      PT LONG TERM GOAL #2   Title Pt will be fit for compression bra/sleeve prn    Time 4    Period Weeks    Status New    Target Date 11/27/20      PT LONG TERM GOAL #3   Title Left shoulder AROM returned to normal pre surgery ROM per pt. (has left TSA)    Time 6    Period Weeks    Status New    Target Date 12/04/20      PT LONG TERM GOAL #4   Title Left chest/trunk swelling will be reduced greater than 50% per pt and will have no discomfort    Time 4    Period Weeks    Status New    Target Date 11/30/20                 Plan - 11/07/20 1013    Clinical Impression Statement Pt had a good amt of fluid aspirated from the axilla but feels it has immediately returned.  Pt now has a compression bra but was also given a script for second to nature to use if she would like a zip front bra as well.  Her current bra has many eyelets.  Pt tolerated MLD well and reported decreased discomfort and size post MT which was encouraging.  Pt had mild softening of the axillary seroma.  pt was educated about importance of as much constant compression in the area.    PT Frequency 2x / week    PT Duration 6 weeks    PT Treatment/Interventions ADLs/Self Care Home Management;Therapeutic activities;Therapeutic exercise;Neuromuscular re-education;Manual techniques;Patient/family education;Orthotic Fit/Training;Manual lymph drainage;Passive range of motion;Scar mobilization    PT Next Visit Plan cont Lt axillary and breast/upper arm  MLD, MFR to cording at axilla, PROM left  shoulder, start to instruct pt in Self MLD,    Recommended Other Services bras, pt wants to wait on sleeve    Consulted and Agree with Plan of Care Patient           Patient will benefit from skilled therapeutic intervention in order to improve the following deficits and impairments:     Visit Diagnosis: Localized edema  Abnormal posture  Aftercare following surgery for neoplasm  Left shoulder pain, unspecified chronicity  Malignant neoplasm of upper-outer quadrant of left breast in female, estrogen receptor positive ([place])     Problem List Patient Active Problem List   Diagnosis Date Noted  . Malignant neoplasm of upper-outer quadrant of left breast in female, estrogen receptor positive ([place]) 09/21/2020  . Prediabetes 07/02/2019  . 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 Endometrial cancer ([place]) 09/26/2015  . Essential hypertension 05/23/2015  . Thoracic facet joint syndrome 04/27/2014  . Back pain 03/24/2014  . Muscle spasm 03/24/2014  . Scoliosis (and kyphoscoliosis), idiopathic 12/22/2012  . Osteopenia 12/22/2012  . Other and unspecified hyperlipidemia 12/22/2012    Stark Bray 11/07/2020, 10:54 AM  [place], [place], [address] Phone: [phone]   Fax:  [phone]  Name: Dawn Powers MRN: [id number] Date of Birth: 07-12-1946

## 2020-11-09 ENCOUNTER — Other Ambulatory Visit: Payer: Self-pay

## 2020-11-09 ENCOUNTER — Encounter: Payer: Self-pay | Admitting: *Deleted

## 2020-11-09 ENCOUNTER — Telehealth: Payer: Self-pay | Admitting: *Deleted

## 2020-11-09 ENCOUNTER — Ambulatory Visit: Payer: Medicare Other | Admitting: Rehabilitation

## 2020-11-09 DIAGNOSIS — R293 Abnormal posture: Secondary | ICD-10-CM | POA: Diagnosis not present

## 2020-11-09 DIAGNOSIS — Z170 Estrogen receptor positive status [ER+]: Secondary | ICD-10-CM | POA: Diagnosis not present

## 2020-11-09 DIAGNOSIS — Z483 Aftercare following surgery for neoplasm: Secondary | ICD-10-CM

## 2020-11-09 DIAGNOSIS — M25512 Pain in left shoulder: Secondary | ICD-10-CM

## 2020-11-09 DIAGNOSIS — C50412 Malignant neoplasm of upper-outer quadrant of left female breast: Secondary | ICD-10-CM | POA: Diagnosis not present

## 2020-11-09 DIAGNOSIS — R600 Localized edema: Secondary | ICD-10-CM

## 2020-11-09 NOTE — Telephone Encounter (Signed)
Received message from patient for a return phone call.  Returned patient's call but had leave message on voicemail.

## 2020-11-09 NOTE — Therapy (Signed)
[place], [place], [address] Phone: [phone]   Fax:  [phone]  Physical Therapy Treatment  Patient Details  Name: Dawn Powers MRN: [id number] Date of Birth: [birth year] Referring Provider (PT): Dr. Jana Hakim   Encounter Date: 11/09/2020   PT End of Session - 11/09/20 1355    Visit Number 4    Number of Visits 12    Date for PT Re-Evaluation 12/11/20    PT Start Time 3614    PT Stop Time 1356    PT Time Calculation (min) 53 min    Activity Tolerance Patient tolerated treatment well    Behavior During Therapy [facility] for tasks assessed/performed           Past Medical History:  Diagnosis Date  . Anxiety   . Arthritis   . Breast cancer ([place]) 09/19/2020   left breast IMC  . Cancer [facility])    Endometrial   . Heart murmur   . Hyperlipidemia   . Hypertension   . Neuromuscular disorder ([place])    DDD  . Pain    lower back -hx "DDD"  . Radiation 11/15/15, 11/23/15, 11/30/15, 12/07/15, 12/14/15   HDR brachytherapy    Past Surgical History:  Procedure Laterality Date  . ABDOMINAL HYSTERECTOMY    . BREAST LUMPECTOMY WITH RADIOACTIVE SEED AND SENTINEL LYMPH NODE BIOPSY Left 10/19/2020   Procedure: LEFT BREAST LUMPECTOMY WITH RADIOACTIVE SEED AND SENTINEL LYMPH NODE BIOPSY;  Surgeon: Stark Klein, MD;  Location: [place];  Service: General;  Laterality: Left;  [place], RNFA  . COLONOSCOPY    . FRACTURE SURGERY Right    ankle  . GANGLION CYST EXCISION    . LYMPH NODE BIOPSY N/A 09/26/2015   Procedure: SENTINEL LYMPH NODE BIOPSY;  Surgeon: Everitt Amber, MD;  [facility];  Service: Gynecology;  Laterality: N/A;  . ROBOTIC ASSISTED TOTAL HYSTERECTOMY WITH BILATERAL SALPINGO OOPHERECTOMY Bilateral 09/26/2015   Procedure: ROBOTIC ASSISTED TOTAL HYSTERECTOMY WITH BILATERAL SALPINGO OOPHORECTOMY;  Surgeon: Everitt Amber, MD;  [facility];  Service: Gynecology;  Laterality: Bilateral;  .  TOTAL SHOULDER ARTHROPLASTY Left 09/10/2018   Procedure: TOTAL SHOULDER ARTHROPLASTY;  Surgeon: Justice Britain, MD;  Location: [place];  Service: Orthopedics;  Laterality: Left;  163mn  . WISDOM TOOTH EXTRACTION      There were no vitals filed for this visit.   Subjective Assessment - 11/09/20 1306    Subjective I love that foam you gave me.  I am finally feeling a bit better.    Pertinent History Pt. presents to PT s/p Left Breast Lumpectomy with SLNB on 10/19/2020.  She was diagnosed during a routine mammogram.  She has a history of Edometrial CA. with hysterectomy in 2016 and radiation in 2017. She had 3 LN removed.  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  She has not had pathology report yet.  She has a left TSA    Currently in Pain? Yes    Pain Score 4     Pain Location Axilla    Pain Orientation Left    Pain Descriptors / Indicators Sore    Pain Type Surgical pain    Pain Onset More than a month ago    Pain Frequency Intermittent                             [facility] Adult PT Treatment/Exercise - 11/09/20  0001      Manual Therapy   Manual Lymphatic Drainage (MLD) short neck, superficial and deep abdominals,  bil axillary LN,sternal nodes Left inguinal LN, anterior interaxillary anastomosis, left axillo-inguinal pathway, left axilla,  and upper arm and retracing pathways in supine and then posterior interaxillary nodes in sidelying    Passive ROM Gentle PROM left shoulder in flex, scaption, abduction, and ER                    PT Short Term Goals - 10/30/20 1632      PT SHORT TERM GOAL #1   Title Pt will be independent with HEP for left shoulder ROM/ strength    Time 4    Period Weeks    Status New             PT Long Term Goals - 11/02/20 1131      PT LONG TERM GOAL #1   Title Pt will be independent in self Manual Lymph drainage to decrease swelling    Time 4    Period Weeks    Status New    Target Date 11/27/20       PT LONG TERM GOAL #2   Title Pt will be fit for compression bra/sleeve prn    Time 4    Period Weeks    Status New    Target Date 11/27/20      PT LONG TERM GOAL #3   Title Left shoulder AROM returned to normal pre surgery ROM per pt. (has left TSA)    Time 6    Period Weeks    Status New    Target Date 12/04/20      PT LONG TERM GOAL #4   Title Left chest/trunk swelling will be reduced greater than 50% per pt and will have no discomfort    Time 4    Period Weeks    Status New    Target Date 11/30/20                 Plan - 11/09/20 1355    Clinical Impression Statement Pt feeling more comfortable today in the axilla with overall less hardness to the seroma but still a good amount of soft seroma fluid present.  Continued focus on MLD of the axilla and posterior shoulder.    PT Frequency 2x / week    PT Duration 6 weeks    PT Treatment/Interventions ADLs/Self Care Home Management;Therapeutic activities;Therapeutic exercise;Neuromuscular re-education;Manual techniques;Patient/family education;Orthotic Fit/Training;Manual lymph drainage;Passive range of motion;Scar mobilization    PT Next Visit Plan cont Lt axillary and breast/upper arm MLD, MFR to cording at axilla, PROM left shoulder, start to instruct pt in Self MLD,    Consulted and Agree with Plan of Care Patient           Patient will benefit from skilled therapeutic intervention in order to improve the following deficits and impairments:     Visit Diagnosis: Localized edema  Abnormal posture  Aftercare following surgery for neoplasm  Left shoulder pain, unspecified chronicity  Malignant neoplasm of upper-outer quadrant of left breast in female, estrogen receptor positive ([place])     Problem List Patient Active Problem List   Diagnosis Date Noted  . Malignant neoplasm of upper-outer quadrant of left breast in female, estrogen receptor positive ([place]) 09/21/2020  . Prediabetes 07/02/2019  . S/P shoulder  replacement, 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  . Endometrial cancer ([place]) 09/26/2015  . Essential hypertension 05/23/2015  . Thoracic facet joint  syndrome 04/27/2014  . Back pain 03/24/2014  . Muscle spasm 03/24/2014  . Scoliosis (and kyphoscoliosis), idiopathic 12/22/2012  . Osteopenia 12/22/2012  . Other and unspecified hyperlipidemia 12/22/2012    Tevis, Kara R 11/09/2020, 1:58 PM  [facility] [address] [place], [place], [address] Phone: [phone]   Fax:  [phone]  Name: Dawn Powers MRN: [id number] Date of Birth: 09/16/1946   

## 2020-11-13 ENCOUNTER — Other Ambulatory Visit: Payer: Self-pay | Admitting: Family Medicine

## 2020-11-13 DIAGNOSIS — G894 Chronic pain syndrome: Secondary | ICD-10-CM

## 2020-11-13 MED ORDER — HYDROCODONE-ACETAMINOPHEN 5-325 MG PO TABS: ORAL_TABLET | ORAL | 0 refills | Status: DC

## 2020-11-13 NOTE — Telephone Encounter (Signed)
Patient is requesting a refill , Patient will be going to Surgeon today for breast surgery follow up.    Medication:  HYDROcodone-acetaminophen (NORCO/VICODIN) 5-325 MG tablet   Has the patient contacted their pharmacy? No. (If no, request that the patient contact the pharmacy for the refill.) (If yes, when and what did the pharmacy advise?)  Preferred Pharmacy (with phone number or street name):  [facility], [place], [place] [address]  Phone:  [phone] Fax:  [phone]  DEA #:  --  [id number] Reason: --     Agent: Please be advised that RX refills may take up to 3 business days. We ask that you follow-up with your pharmacy.

## 2020-11-14 ENCOUNTER — Ambulatory Visit: Payer: Medicare Other

## 2020-11-14 ENCOUNTER — Telehealth: Payer: Self-pay

## 2020-11-14 ENCOUNTER — Other Ambulatory Visit: Payer: Self-pay

## 2020-11-14 DIAGNOSIS — Z170 Estrogen receptor positive status [ER+]: Secondary | ICD-10-CM

## 2020-11-14 DIAGNOSIS — Z483 Aftercare following surgery for neoplasm: Secondary | ICD-10-CM

## 2020-11-14 DIAGNOSIS — R293 Abnormal posture: Secondary | ICD-10-CM | POA: Diagnosis not present

## 2020-11-14 DIAGNOSIS — M25512 Pain in left shoulder: Secondary | ICD-10-CM | POA: Diagnosis not present

## 2020-11-14 DIAGNOSIS — R600 Localized edema: Secondary | ICD-10-CM | POA: Diagnosis not present

## 2020-11-14 DIAGNOSIS — C50412 Malignant neoplasm of upper-outer quadrant of left female breast: Secondary | ICD-10-CM

## 2020-11-14 NOTE — Therapy (Signed)
[facility] [address] [place], [place], [address] Phone: [phone]   Fax:  [phone]  Physical Therapy Treatment  Patient Details  Name: Dawn Powers MRN: [id number] Date of Birth: 29-Dec-1945 Referring Provider (PT): Dr. Darnelle Catalan   Encounter Date: 11/14/2020   PT End of Session - 11/14/20 1223    Visit Number 5    Number of Visits 12    Date for PT Re-Evaluation 12/11/20    PT Start Time 1102    PT Stop Time 1200    PT Time Calculation (min) 58 min    Activity Tolerance Patient tolerated treatment well    Behavior During Therapy [facility] for tasks assessed/performed           Past Medical History:  Diagnosis Date  . Anxiety   . Arthritis   . Breast cancer (HCC) 09/19/2020   left breast IMC  . Cancer [facility])    Endometrial   . Heart murmur   . Hyperlipidemia   . Hypertension   . Neuromuscular disorder (HCC)    DDD  . Pain    lower back -hx "DDD"  . Radiation 11/15/15, 11/23/15, 11/30/15, 12/07/15, 12/14/15   HDR brachytherapy    Past Surgical History:  Procedure Laterality Date  . ABDOMINAL HYSTERECTOMY    . BREAST LUMPECTOMY WITH RADIOACTIVE SEED AND SENTINEL LYMPH NODE BIOPSY Left 10/19/2020   Procedure: LEFT BREAST LUMPECTOMY WITH RADIOACTIVE SEED AND SENTINEL LYMPH NODE BIOPSY;  Surgeon: Almond Lint, MD;  Location: [facility];  Service: General;  Laterality: Left;  PEC BLOCK, RNFA  . COLONOSCOPY    . FRACTURE SURGERY Right    ankle  . GANGLION CYST EXCISION    . LYMPH NODE BIOPSY N/A 09/26/2015   Procedure: SENTINEL LYMPH NODE BIOPSY;  Surgeon: Adolphus Birchwood, MD;  Location: [facility];  Service: Gynecology;  Laterality: N/A;  . ROBOTIC ASSISTED TOTAL HYSTERECTOMY WITH BILATERAL SALPINGO OOPHERECTOMY Bilateral 09/26/2015   Procedure: ROBOTIC ASSISTED TOTAL HYSTERECTOMY WITH BILATERAL SALPINGO OOPHORECTOMY;  Surgeon: Adolphus Birchwood, MD;  Location: [facility];  Service: Gynecology;  Laterality: Bilateral;  .  TOTAL SHOULDER ARTHROPLASTY Left 09/10/2018   Procedure: TOTAL SHOULDER ARTHROPLASTY;  Surgeon: Francena Hanly, MD;  Location: [facility];  Service: Orthopedics;  Laterality: Left;   . WISDOM TOOTH EXTRACTION      There were no vitals filed for this visit.   Subjective Assessment - 11/14/20 1101    Subjective I was sitting on my sofa on Sunday and when I stood up my left ankle gave way and I slid down to the floor.  Injured left ankle and is using a loftstrand crutch. Dr. Donell Beers checked yesterday and told her it is a sprain.  Also mentioned to her about orthopedic urgent cares if she ever needs an XRay.  Saw Dr. yesterday and she drained seroma again.  I follow up again in a month. Cording is still there and she told me to work on it.  Feel tender at medial arm and tight.    Pertinent History Pt. presents to PT s/p Left Breast Lumpectomy with SLNB on 10/19/2020.  She was diagnosed during a routine mammogram.  She has a history of Edometrial CA. with hysterectomy in 2016 and radiation in 2017. She had 3 LN removed.  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  She has not had pathology report yet.  She has a left TSA    Patient Stated Goals Check breast,  axilla for swelling, ROM    Currently in Pain? Yes    Pain Score 4     Pain Location Arm    Pain Orientation Left    Pain Descriptors / Indicators Tender;Tightness;Sore    Pain Onset More than a month ago    Pain Frequency Intermittent    Multiple Pain Sites No                             [facility] Adult PT Treatment/Exercise - 11/14/20 0001      Manual Therapy   Edema Management Pt was instructed in MLD including short neck, activation of bilaeral axillary and let inguinal LNs. Anterior interaxillary pathway aand axillo-inguinal pathway.  She practiced each of these with occasional VC's but generally good form    Myofascial Release MFR to areas of cording at axilla    Manual Lymphatic Drainage (MLD) short  neck,  bil axillary LN,sternal nodes Left inguinal LN, anterior interaxillary anastomosis, left axillo-inguinal pathway, left axilla,  and upper arm and retracing pathways in supine and then posterior interaxillary nodes in sidelying.    Passive ROM Gentle PROM left shoulder in flex, scaption, abduction, and ER                  PT Education - 11/14/20 1203    Education Details Pt was educated in MLD to supraclavicular, bilateral axillary LN's, left inguinal LN, Anterior interaxillary pathway, left axilloinguinal pathway.  She was given written handout    Person(s) Educated Patient    Methods Explanation;Demonstration;Handout    Comprehension Returned demonstration;Verbalized understanding;Need further instruction            PT Short Term Goals - 10/30/20 1632      PT SHORT TERM GOAL #1   Title Pt will be independent with HEP for left shoulder ROM/ strength    Time 4    Period Weeks    Status New             PT Long Term Goals - 11/02/20 1131      PT LONG TERM GOAL #1   Title Pt will be independent in self Manual Lymph drainage to decrease swelling    Time 4    Period Weeks    Status New    Target Date 11/27/20      PT LONG TERM GOAL #2   Title Pt will be fit for compression bra/sleeve prn    Time 4    Period Weeks    Status New    Target Date 11/27/20      PT LONG TERM GOAL #3   Title Left shoulder AROM returned to normal pre surgery ROM per pt. (has left TSA)    Time 6    Period Weeks    Status New    Target Date 12/04/20      PT LONG TERM GOAL #4   Title Left chest/trunk swelling will be reduced greater than 50% per pt and will have no discomfort    Time 4    Period Weeks    Status New    Target Date 11/30/20                 Plan - 11/14/20 1224    Clinical Impression Statement Pt presents with a left sprained ankle today after twisting it while standing up on Sunday. It is improving.Pt. had seroma drained again yesterday and still has  swelling  present under left axilla, but improved.  Multiple cords are present  in axilla today but are not limiting her function. She had good tolerance for PROM.  Treatment also included MLD to left trunk/breast and left UE and pt was instructed in lead up and performed all LN and Pathway activation.  Will need to review and instruct left breast and upper arm next visit.  She used good form with only occasional VC's    Personal Factors and Comorbidities Comorbidity 3+    Comorbidities left breast CA with SLNB, prior endometrial CA, Left TSA, Back spasms    Stability/Clinical Decision Making Evolving/Moderate complexity    Rehab Potential Excellent    PT Frequency 2x / week    PT Duration 6 weeks    PT Treatment/Interventions ADLs/Self Care Home Management;Therapeutic activities;Therapeutic exercise;Neuromuscular re-education;Manual techniques;Patient/family education;Orthotic Fit/Training;Manual lymph drainage;Passive range of motion;Scar mobilization    PT Next Visit Plan cont Lt axillary and breast/upper arm MLD, MFR to cording at axilla, PROM left shoulder, start to instruct pt in Self MLD,    PT Home Exercise Plan 4 post op exs    Consulted and Agree with Plan of Care Patient           Patient will benefit from skilled therapeutic intervention in order to improve the following deficits and impairments:  Decreased mobility,Decreased strength,Increased edema,Impaired sensation,Postural dysfunction,Decreased scar mobility,Pain,Decreased skin integrity,Decreased knowledge of precautions,Decreased activity tolerance,Impaired UE functional use,Decreased range of motion  Visit Diagnosis: Localized edema  Abnormal posture  Aftercare following surgery for neoplasm  Left shoulder pain, unspecified chronicity  Malignant neoplasm of upper-outer quadrant of left breast in female, estrogen receptor positive ([place])     Problem List Patient Active Problem List   Diagnosis Date Noted  .  Malignant neoplasm of upper-outer quadrant of left breast in female, estrogen receptor positive ([place]) 09/21/2020  . Prediabetes 07/02/2019  . 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 Endometrial cancer ([place]) 09/26/2015  . Essential hypertension 05/23/2015  . Thoracic facet joint syndrome 04/27/2014  . Back pain 03/24/2014  . Muscle spasm 03/24/2014  . Scoliosis (and kyphoscoliosis), idiopathic 12/22/2012  . Osteopenia 12/22/2012  . Other and unspecified hyperlipidemia 12/22/2012    Claris Pong 11/14/2020, 12:30 PM  [place], [place], [address] Phone: [phone]   Fax:  [phone]  Name: Dawn Powers MRN: [id number] Date of Birth: May 22, 1946  Cheral Almas, PT 11/14/20 12:32 PM

## 2020-11-14 NOTE — Telephone Encounter (Signed)
Returned call to pt about upcoming appointment.

## 2020-11-14 NOTE — Patient Instructions (Signed)
Manual Lymph Drainage for Left Breast.  Do daily.  Do slowly. Use flat hands with just enough pressure to stretch the skin. Do not slide over the skin, but move the skin with the hand you're using. Lie down or sit comfortably (in a recliner, for example) to do this.  1) Hug yourself:  cross arms and do circles at collar bones near neck 5-7 times (to "wake up" lots of lymph nodes in this area). 2) Take slow deep breaths, allowing your belly to balloon out as your breathe in, 5x (to "wake up" abdominal lymph nodes to take on extra fluid). 3) Right armpit-stretch skin in small circles to stimulate intact lymph nodes there, 5-7x. 4) Left groin area, at panty line-stretch skin in small circles to stimulate lymph nodes 5-7x. 5) Redirect fluid from left chest toward right armpit (stretch skin starting at left chest in 3-4 spots working toward right armpit) 3-4x across the chest. 6) Redirect fluid from left armpit toward left groin (cup your hand around the curve of your left side and do 3-4 "pumps" from armpit to groin) 3-4x down your side. 7) Draw an imaginary diagonal line from upper outer breast through the nipple area toward lower inner breast.  Direct fluid upward and inward from this line toward the pathway across your upper chest (established in #5).  Do this in three rows to treat all of the upper inner breast tissue, and do each row 3-4x. 8) Then repeat #5 above. 9) Direct fluid to treat all of lower outer breast tissue downward and outward toward pathway established in #6 that is aimed at the left groin. 10)  Then repeat #6 above. 11)  End with repeating #3 and #4 above.   [facility] [address] ,    [address] [phone] 

## 2020-11-16 ENCOUNTER — Other Ambulatory Visit: Payer: Self-pay

## 2020-11-16 ENCOUNTER — Ambulatory Visit: Payer: Medicare Other | Admitting: Rehabilitation

## 2020-11-16 ENCOUNTER — Encounter: Payer: Self-pay | Admitting: Rehabilitation

## 2020-11-16 DIAGNOSIS — Z483 Aftercare following surgery for neoplasm: Secondary | ICD-10-CM | POA: Diagnosis not present

## 2020-11-16 DIAGNOSIS — M25512 Pain in left shoulder: Secondary | ICD-10-CM | POA: Diagnosis not present

## 2020-11-16 DIAGNOSIS — Z170 Estrogen receptor positive status [ER+]: Secondary | ICD-10-CM

## 2020-11-16 DIAGNOSIS — R293 Abnormal posture: Secondary | ICD-10-CM

## 2020-11-16 DIAGNOSIS — R600 Localized edema: Secondary | ICD-10-CM | POA: Diagnosis not present

## 2020-11-16 DIAGNOSIS — C50412 Malignant neoplasm of upper-outer quadrant of left female breast: Secondary | ICD-10-CM | POA: Diagnosis not present

## 2020-11-16 NOTE — Therapy (Signed)
[place], [place], [address] Phone: [phone]   Fax:  [phone]  Physical Therapy Treatment  Patient Details  Name: Dawn Powers MRN: [id number] Date of Birth: [birth year] Referring Provider (PT): Dr. Jana Hakim   Encounter Date: 11/16/2020   PT End of Session - 11/16/20 1206    Visit Number 6    Number of Visits 12    Date for PT Re-Evaluation 12/11/20    PT Start Time 1000    PT Stop Time 1056    PT Time Calculation (min) 56 min    Activity Tolerance Patient tolerated treatment well    Behavior During Therapy [facility] for tasks assessed/performed           Past Medical History:  Diagnosis Date  . Anxiety   . Arthritis   . Breast cancer ([place]) 09/19/2020   left breast IMC  . Cancer [facility])    Endometrial   . Heart murmur   . Hyperlipidemia   . Hypertension   . Neuromuscular disorder ([place])    DDD  . Pain    lower back -hx "DDD"  . Radiation 11/15/15, 11/23/15, 11/30/15, 12/07/15, 12/14/15   HDR brachytherapy    Past Surgical History:  Procedure Laterality Date  . ABDOMINAL HYSTERECTOMY    . BREAST LUMPECTOMY WITH RADIOACTIVE SEED AND SENTINEL LYMPH NODE BIOPSY Left 10/19/2020   Procedure: LEFT BREAST LUMPECTOMY WITH RADIOACTIVE SEED AND SENTINEL LYMPH NODE BIOPSY;  Surgeon: Stark Klein, MD;  Location: [place];  Service: General;  Laterality: Left;  [place], RNFA  . COLONOSCOPY    . FRACTURE SURGERY Right    ankle  . GANGLION CYST EXCISION    . LYMPH NODE BIOPSY N/A 09/26/2015   Procedure: SENTINEL LYMPH NODE BIOPSY;  Surgeon: Everitt Amber, MD;  Location[facility];  Service: Gynecology;  Laterality: N/A;  . ROBOTIC ASSISTED TOTAL HYSTERECTOMY WITH BILATERAL SALPINGO OOPHERECTOMY Bilateral 09/26/2015   Procedure: ROBOTIC ASSISTED TOTAL HYSTERECTOMY WITH BILATERAL SALPINGO OOPHORECTOMY;  Surgeon: Everitt Amber, MD;  Location: [facility];  Service: Gynecology;  Laterality: Bilateral;  .  TOTAL SHOULDER ARTHROPLASTY Left 09/10/2018   Procedure: TOTAL SHOULDER ARTHROPLASTY;  Surgeon: Justice Britain, MD;  Location: [place];  Service: Orthopedics;  Laterality: Left;  169min  . WISDOM TOOTH EXTRACTION      There were no vitals filed for this visit.   Subjective Assessment - 11/16/20 1003    Subjective The ankle is going better.  I am also trying the corset as Dr. Barry Dienes suggested again.  I did my massage maybe 1 time I have some questions about the sequence.  I feel kind of human today    Pertinent History Pt. presents to PT s/p Left Breast Lumpectomy with SLNB on 10/19/2020.  She was diagnosed during a routine mammogram.  She has a history of Edometrial CA. with hysterectomy in 2016 and radiation in 2017. She had 3 LN removed.  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  She has not had pathology report yet.  She has a left TSA    Patient Stated Goals Check breast, axilla for swelling, ROM    Currently in Pain? Yes    Pain Score 3     Pain Location Axilla    Pain Orientation Left    Pain Descriptors / Indicators Tender;Sore    Pain Type Surgical pain    Pain Onset 1 to 4 weeks ago    Pain  Frequency Intermittent                             [facility] Adult PT Treatment/Exercise - 11/16/20 0001      Manual Therapy   Myofascial Release MFR to areas of cording at axilla    Manual Lymphatic Drainage (MLD) focused on seated in front fo the mirror reviewing all steps with vcs and tcs as needed.  Needed reinforcement on breast sequence and using full hand for increased surface area then PT performed additional MLD in supine to the lateral breast and trunk and upper arm    Passive ROM Gentle PROM left shoulder in flex, scaption, abduction, and ER                    PT Short Term Goals - 10/30/20 1632      PT SHORT TERM GOAL #1   Title Pt will be independent with HEP for left shoulder ROM/ strength    Time 4    Period Weeks    Status New              PT Long Term Goals - 11/02/20 1131      PT LONG TERM GOAL #1   Title Pt will be independent in self Manual Lymph drainage to decrease swelling    Time 4    Period Weeks    Status New    Target Date 11/27/20      PT LONG TERM GOAL #2   Title Pt will be fit for compression bra/sleeve prn    Time 4    Period Weeks    Status New    Target Date 11/27/20      PT LONG TERM GOAL #3   Title Left shoulder AROM returned to normal pre surgery ROM per pt. (has left TSA)    Time 6    Period Weeks    Status New    Target Date 12/04/20      PT LONG TERM GOAL #4   Title Left chest/trunk swelling will be reduced greater than 50% per pt and will have no discomfort    Time 4    Period Weeks    Status New    Target Date 11/30/20                 Plan - 11/16/20 1207    Clinical Impression Statement Focused on self MLD review which pt did well with PT reading reach step and then performing each step.  Pt continues with 4 cords in the axilla and a golf ball size seroma in the axilla which limits cord release work a bit.  Pt will start radiation simulation next week and does recheck with surgeon in 1 month.    PT Frequency 2x / week    PT Duration 6 weeks    PT Treatment/Interventions ADLs/Self Care Home Management;Therapeutic activities;Therapeutic exercise;Neuromuscular re-education;Manual techniques;Patient/family education;Orthotic Fit/Training;Manual lymph drainage;Passive range of motion;Scar mobilization    PT Next Visit Plan cont Lt axillary and breast/upper arm MLD, MFR to cording at axilla, PROM left shoulder    PT Home Exercise Plan 4 post op exs, adding wrist extension to qi qong movements    Consulted and Agree with Plan of Care Patient           Patient will benefit from skilled therapeutic intervention in order to improve the following deficits and impairments:     Visit Diagnosis:  Localized edema  Abnormal posture  Aftercare following surgery for  neoplasm  Left shoulder pain, unspecified chronicity  Malignant neoplasm of upper-outer quadrant of left breast in female, estrogen receptor positive ([place])     Problem List Patient Active Problem List   Diagnosis Date Noted  . Malignant neoplasm of upper-outer quadrant of left breast in female, estrogen receptor positive ([place]) 09/21/2020  . Prediabetes 07/02/2019  . 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 Endometrial cancer ([place]) 09/26/2015  . Essential hypertension 05/23/2015  . Thoracic facet joint syndrome 04/27/2014  . Back pain 03/24/2014  . Muscle spasm 03/24/2014  . Scoliosis (and kyphoscoliosis), idiopathic 12/22/2012  . Osteopenia 12/22/2012  . Other and unspecified hyperlipidemia 12/22/2012    Stark Bray 11/16/2020, 12:11 PM  [place], [place], [address] Phone: [phone]   Fax:  [phone]  Name: TANASIA BUDZINSKI MRN: [id number] Date of Birth: April 12, 1946

## 2020-11-20 ENCOUNTER — Ambulatory Visit: Payer: Medicare Other

## 2020-11-20 ENCOUNTER — Other Ambulatory Visit: Payer: Self-pay

## 2020-11-20 DIAGNOSIS — R293 Abnormal posture: Secondary | ICD-10-CM | POA: Diagnosis not present

## 2020-11-20 DIAGNOSIS — R600 Localized edema: Secondary | ICD-10-CM

## 2020-11-20 DIAGNOSIS — Z170 Estrogen receptor positive status [ER+]: Secondary | ICD-10-CM

## 2020-11-20 DIAGNOSIS — C50412 Malignant neoplasm of upper-outer quadrant of left female breast: Secondary | ICD-10-CM

## 2020-11-20 DIAGNOSIS — Z483 Aftercare following surgery for neoplasm: Secondary | ICD-10-CM | POA: Diagnosis not present

## 2020-11-20 DIAGNOSIS — M25512 Pain in left shoulder: Secondary | ICD-10-CM

## 2020-11-20 NOTE — Therapy (Signed)
[place], [place], [address] Phone: [phone]   Fax:  [phone]  Physical Therapy Treatment  Patient Details  Name: Dawn Powers MRN: [id number] Date of Birth: 1946-01-24 Referring Provider (PT): Dr. Jana Hakim   Encounter Date: 11/20/2020   PT End of Session - 11/20/20 1540    Visit Number 7    Number of Visits 12    Date for PT Re-Evaluation 12/11/20    Authorization Type MC    Authorization - Number of Visits 10    PT Start Time 1300    PT Stop Time 1400    PT Time Calculation (min) 60 min    Activity Tolerance Patient tolerated treatment well    Behavior During Therapy [facility] for tasks assessed/performed           Past Medical History:  Diagnosis Date  . Anxiety   . Arthritis   . Breast cancer ([place]) 09/19/2020   left breast IMC  . Cancer [facility])    Endometrial   . Heart murmur   . Hyperlipidemia   . Hypertension   . Neuromuscular disorder ([place])    DDD  . Pain    lower back -hx "DDD"  . Radiation 11/15/15, 11/23/15, 11/30/15, 12/07/15, 12/14/15   HDR brachytherapy    Past Surgical History:  Procedure Laterality Date  . ABDOMINAL HYSTERECTOMY    . BREAST LUMPECTOMY WITH RADIOACTIVE SEED AND SENTINEL LYMPH NODE BIOPSY Left 10/19/2020   Procedure: LEFT BREAST LUMPECTOMY WITH RADIOACTIVE SEED AND SENTINEL LYMPH NODE BIOPSY;  Surgeon: Stark Klein, MD;  Location: [place];  Service: General;  Laterality: Left;  [place], RNFA  . COLONOSCOPY    . FRACTURE SURGERY Right    ankle  . GANGLION CYST EXCISION    . LYMPH NODE BIOPSY N/A 09/26/2015   Procedure: SENTINEL LYMPH NODE BIOPSY;  Surgeon: Everitt Amber, MD;  [facility];  Service: Gynecology;  Laterality: N/A;  . ROBOTIC ASSISTED TOTAL HYSTERECTOMY WITH BILATERAL SALPINGO OOPHERECTOMY Bilateral 09/26/2015   Procedure: ROBOTIC ASSISTED TOTAL HYSTERECTOMY WITH BILATERAL SALPINGO OOPHORECTOMY;  Surgeon: Everitt Amber, MD;   [facility];  Service: Gynecology;  Laterality: Bilateral;  . TOTAL SHOULDER ARTHROPLASTY Left 09/10/2018   Procedure: TOTAL SHOULDER ARTHROPLASTY;  Surgeon: Justice Britain, MD;  Location: [place];  Service: Orthopedics;  Laterality: Left;  172min  . WISDOM TOOTH EXTRACTION      There were no vitals filed for this visit.   Subjective Assessment - 11/20/20 1254    Subjective Ankle is better.  I can move around the house without the crutch. I think I am starting to learn the MLD.  I just did the ABC class and it was wonderful.  I learned so much and I would like to do it again so my husband can watch too. Can you show me some things to do for my ankle?    Pertinent History Pt. presents to PT s/p Left Breast Lumpectomy with SLNB on 10/19/2020.  She was diagnosed during a routine mammogram.  She has a history of Edometrial CA. with hysterectomy in 2016 and radiation in 2017. She had 3 LN removed.  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  She has not had pathology report yet.  She has a left TSA    Patient Stated Goals Check breast, axilla for swelling, ROM    Currently in Pain? Yes    Pain Score 2  Pain Location Axilla    Pain Orientation Left    Pain Descriptors / Indicators Tender;Sore    Pain Type Surgical pain    Pain Onset 1 to 4 weeks ago    Pain Frequency Intermittent                             [facility] Adult PT Treatment/Exercise - 11/20/20 0001      Exercises   Exercises Ankle   ankle circles CCW/CW, ABC's, towel scrunches, sitting heel and toe raises, AROM DF/PF/INV/EV.     Manual Therapy   Myofascial Release MFR to areas of cording at axilla    Manual Lymphatic Drainage (MLD) Pt. performed all MLD with VC's TC's by PT as needed in supine.short neck,  bil axillary LN, Left inguinal LN, anterior interaxillary anastomosis, left axillo-inguinal pathway,  Left breast /left axilla,  and left upper arm lateral, then medial to lateral and  lateral arm and retracing pathways in supine and ending with LN's.    Passive ROM Gentle PROM left shoulder in flex, scaption, abduction, and ER                  PT Education - 11/20/20 1539    Education Details Pt. requested ankle exercises and was instructed in AROM in pain free ROM including ankle circles, DF/PF/INV/EV, towel scrunches with toes, ABC's, sitting heel and toe taps all x 10.  Pt was given written instructions and advised to discontinue anything that causes pain.    Person(s) Educated Patient    Methods Explanation;Demonstration;Handout    Comprehension Verbalized understanding;Returned demonstration            PT Short Term Goals - 10/30/20 1632      PT SHORT TERM GOAL #1   Title Pt will be independent with HEP for left shoulder ROM/ strength    Time 4    Period Weeks    Status New             PT Long Term Goals - 11/02/20 1131      PT LONG TERM GOAL #1   Title Pt will be independent in self Manual Lymph drainage to decrease swelling    Time 4    Period Weeks    Status New    Target Date 11/27/20      PT LONG TERM GOAL #2   Title Pt will be fit for compression bra/sleeve prn    Time 4    Period Weeks    Status New    Target Date 11/27/20      PT LONG TERM GOAL #3   Title Left shoulder AROM returned to normal pre surgery ROM per pt. (has left TSA)    Time 6    Period Weeks    Status New    Target Date 12/04/20      PT LONG TERM GOAL #4   Title Left chest/trunk swelling will be reduced greater than 50% per pt and will have no discomfort    Time 4    Period Weeks    Status New    Target Date 11/30/20                 Plan - 11/20/20 1541    Clinical Impression Statement Checked pts ankle at her request; decreased swelling and resolving bruising. Able to perform AROM exs without increased pain and given HEP for left ankle. Reviewed all Self MLD  to left breast/axillary region in supine and pt used much better stretch and slowed  down considerably to use good form.  She still required VC's and TC's but was greatly improved.  MFR was used to areas of cording and PROM was performed to left shoulder with pt feeling much better afterwards. Pt. loved the ABC class and would like to take again so her husband may listen as well. She will set up today. Swelling from seroma improved and less tender but still present some.    Personal Factors and Comorbidities Comorbidity 3+    Comorbidities left breast CA with SLNB, prior endometrial CA, Left TSA, Back spasms    Stability/Clinical Decision Making Evolving/Moderate complexity    Rehab Potential Excellent    PT Frequency 2x / week    PT Duration 6 weeks    PT Treatment/Interventions ADLs/Self Care Home Management;Therapeutic activities;Therapeutic exercise;Neuromuscular re-education;Manual techniques;Patient/family education;Orthotic Fit/Training;Manual lymph drainage;Passive range of motion;Scar mobilization    PT Next Visit Plan cont Lt axillary and breast/upper arm MLD, MFR to cording at axilla, PROM left shoulder, supine TBand.  Has pt tried TG soft again?    PT Home Exercise Plan 4 post op exs, adding wrist extension to qi qong movements    Consulted and Agree with Plan of Care Patient           Patient will benefit from skilled therapeutic intervention in order to improve the following deficits and impairments:  Decreased mobility,Decreased strength,Increased edema,Impaired sensation,Postural dysfunction,Decreased scar mobility,Pain,Decreased skin integrity,Decreased knowledge of precautions,Decreased activity tolerance,Impaired UE functional use,Decreased range of motion  Visit Diagnosis: Localized edema  Abnormal posture  Aftercare following surgery for neoplasm  Left shoulder pain, unspecified chronicity  Malignant neoplasm of upper-outer quadrant of left breast in female, estrogen receptor positive ([place])     Problem List Patient Active Problem List    Diagnosis Date Noted  . Malignant neoplasm of upper-outer quadrant of left breast in female, estrogen receptor positive ([place]) 09/21/2020  . Prediabetes 07/02/2019  . 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 Endometrial cancer ([place]) 09/26/2015  . Essential hypertension 05/23/2015  . Thoracic facet joint syndrome 04/27/2014  . Back pain 03/24/2014  . Muscle spasm 03/24/2014  . Scoliosis (and kyphoscoliosis), idiopathic 12/22/2012  . Osteopenia 12/22/2012  . Other and unspecified hyperlipidemia 12/22/2012    Claris Pong 11/20/2020, 3:48 PM  [place], [place], [address] Phone: [phone]   Fax:  [phone]  Name: Dawn Powers MRN: [id number] Date of Birth: 18-Dec-1945  Cheral Almas, PT 11/20/20 3:50 PM

## 2020-11-20 NOTE — Progress Notes (Signed)
Location of Breast Cancer: Malignant neoplasm of upper-outer quadrant of LEFT breast, estrogen receptor positive  Histology per Pathology Report:  10/19/2020 FINAL MICROSCOPIC DIAGNOSIS:  A. BREAST, LEFT, LUMPECTOMY:  - Invasive ductal carcinoma, 1.2 cm, grade 2  - Resection margins are negative for carcinoma; closest are the anterior margin at 0.2 cm and the junction of posterior and inferior margins at 0.3 cm  - Biopsy site change  - See oncology table  B. LYMPH NODE, LEFT AXILLARY #1, SENTINEL, EXCISION:  - Lymph node, negative for carcinoma (0/1)  C. LYMPH NODE, LEFT AXILLARY #2, SENTINEL, EXCISION:  - Lymph node, negative for carcinoma (0/1)  D. LYMPH NODE, LEFT AXILLARY #3, SENTINEL, EXCISION:  - Lymph node, negative for carcinoma (0/1)   Receptor Status: ER(95%), PR (10%), Her2-neu (Negative via FISH), Ki-67(10%)  Did patient present with symptoms (if so, please note symptoms) or was this found on screening mammography?:  Patient had routine screening mammography on 08/18/2020 showing a possible abnormality in the left breast. She underwent left diagnostic mammography with tomography and left breast ultrasonography at [facility] on 09/06/2020 showing: breast density category B; palpable 1.1 cm mass at 3 o'clock; normal-appearing left axillary lymph nodes  Past/Anticipated interventions by surgeon, if any: 10/19/2020 Dr. Faera Byerly Left Breast Radioactive seed localized lumpectomy and sentinel lymph node biopsy  Past/Anticipated interventions by medical oncology, if any:  Under care of Dr. Gustav Magrinat: 11/01/2020 (1) genetics testing pending (2) status post left lumpectomy and sentinel lymph node sampling 10/19/2020 for a pT1c pN0, stage IA invasive ductal carcinoma, grade 2, with negative margins (3) Oncotype will be obtained from the definitive surgical sample  (a) 11/03/2020 Keisha Martini-Breast Navigator:  "Received oncotype results of 15/4%.  Patient is   aware. Referral placed for rad onc." (4) adjuvant radiation as appropriate (5) anastrozole started neoadjuvantly 09/21/2020  Lymphedema issues, if any:  Yes. Patient is currently working with PT on massage and manual drainage. She also developed a seroma that has been drained twice.     Pain issues, if any: Axilla is very tender from lumpectomy, and she has developed cording under right arm that PT is helping her manage  SAFETY ISSUES:  Prior radiation? Yes: 11/15/2015-12/14/2015: Vaginal cuff 30 gray in 5 fractions (Dr. James Kinard)  Pacemaker/ICD? No  Possible current pregnancy? No--hysterectomy 2016  Is the patient on methotrexate? No  Current Complaints / other details:  Patient has received both Pfizer vaccines, as well as Pfizer booster and annual high dose flu shot        

## 2020-11-20 NOTE — Patient Instructions (Addendum)
Access Code: [facility] URL: [email or web address] Date: 11/20/2020 Prepared by: Cheral Almas  Exercises Seated Toe Raise - 1 x daily - 7 x weekly - 1 sets - 10 reps Seated Heel Raise - 1 x daily - 7 x weekly - 1 sets - 10 reps Towel Scrunches - 1 x daily - 7 x weekly - 1 sets - 10 reps Seated Ankle Alphabet - 1 x daily - 7 x weekly - 10 reps Also instructed in AROM DF/PF, INV,EV, and ankle circles

## 2020-11-20 NOTE — Progress Notes (Signed)
Radiation Oncology         [phone] ________________________________  Initial Outpatient Consultation  Name: Dawn Powers MRN: [id number]  Date: 11/21/2020  DOB: 1946-09-04  FK:CLEXNTZ, Gay Filler, MD  Magrinat, Virgie Dad, MD   REFERRING PHYSICIAN: Magrinat, Virgie Dad, MD  DIAGNOSIS:    ICD-10-CM   1. Malignant neoplasm of upper-outer quadrant of left breast in female, estrogen receptor positive ([place])  C50.412    Z17.0     Cancer Staging Endometrial cancer ([place]) Staging form: Corpus Uteri - Carcinoma, AJCC 7th Edition - Clinical: FIGO Stage IB (T1b, N0, M0) - Signed by Chauncey Cruel, MD on 09/21/2020 Histologic grade (G): G2  Malignant neoplasm of upper-outer quadrant of left breast in female, estrogen receptor positive ([place]) Staging form: Breast, AJCC 8th Edition - Clinical: Stage IA (cT1c, cN0, cM0, G3, ER+, PR+, HER2: Equivocal) - Signed by Chauncey Cruel, MD on 09/21/2020  pT1c pN0   CHIEF COMPLAINT: Here to discuss management of left breast cancer  HISTORY OF PRESENT ILLNESS::Dawn Powers is a 75 y.o. female who presented with left breast abnormality on the following imaging: bilateral screening mammogram on the date of 08/18/2020. No symptoms were reported at that time. Ultrasound of left breast on 09/06/2020 revealed a 1.1 cm mass with imaging features highly suspicious of malignancy in the 3 o'clock position of the left breast.  Biopsy on the date of 09/19/2020 showed invasive mammary carcinoma. E-cadherin was positive, supporting a ductal origin. ER status: 95% strong; PR status: 10% strong; Her2 status: negative; Grade: 3.  Breast/nodal surgery on the date of 10/19/2020 revealed histology of invasive ductal carcinoma that spanned 1.2 cm. Resection margins were negative for carcinoma, the closest being 0.2 cm from the anterior margin and 0.3 cm from the junction of the posterior and inferior margins; nodal status of negative (0/3). ER status: 95% strong; PR  status: 10% strong; Her2 status: negative; Grade: 2.  Of note she is tolerating anastrozole that was started neoadjuvantly 09/21/2020  Lymphedema issues, if any:  Yes. Patient is currently working with PT on massage and manual drainage. She also developed a seroma that has been drained twice.     Pain issues, if any: Axilla is very tender from lumpectomy, and she has developed cording under right arm that PT is helping her manage  SAFETY ISSUES:  Prior radiation? Yes: 11/15/2015-12/14/2015: Vaginal cuff 30 gray in 5 fractions (Dr. Gery Pray)  Pacemaker/ICD? No  Possible current pregnancy? No--hysterectomy 2016  Is the patient on methotrexate? No  Current Complaints / other details:  Patient has received both Pfizer vaccines, as well as [employer] booster and annual high dose flu shot     PREVIOUS RADIATION THERAPY: Yes; history of stage IB grade 2 endometrial cancer (Dr. Sondra Come)  11/15/2015-12/14/2015: Vaginal cuff 30 gray in 5 fractions area the patient was treated with a 3 cm diameter segmented cylinder. Treatment length 4 cm with prescription to the mucosal surface. Iridium 192 was the high-dose-rate source   PAST MEDICAL HISTORY:  has a past medical history of Anxiety, Arthritis, Breast cancer ([place]) (09/19/2020), Cancer ([place]), Heart murmur, Hyperlipidemia, Hypertension, Neuromuscular disorder ([place]), Pain, and Radiation (11/15/15, 11/23/15, 11/30/15, 12/07/15, 12/14/15).    PAST SURGICAL HISTORY: Past Surgical History:  Procedure Laterality Date  . ABDOMINAL HYSTERECTOMY    . BREAST LUMPECTOMY WITH RADIOACTIVE SEED AND SENTINEL LYMPH NODE BIOPSY Left 10/19/2020   Procedure: LEFT BREAST LUMPECTOMY WITH RADIOACTIVE SEED AND SENTINEL LYMPH NODE BIOPSY;  Surgeon: Stark Klein, MD;  Location: [place];  Service: General;  Laterality: Left;  [place], RNFA  . COLONOSCOPY    . FRACTURE SURGERY Right    ankle  . GANGLION CYST EXCISION    . LYMPH NODE BIOPSY N/A 09/26/2015    Procedure: SENTINEL LYMPH NODE BIOPSY;  Surgeon: Everitt Amber, MD;  [facility];  Service: Gynecology;  Laterality: N/A;  . ROBOTIC ASSISTED TOTAL HYSTERECTOMY WITH BILATERAL SALPINGO OOPHERECTOMY Bilateral 09/26/2015   Procedure: ROBOTIC ASSISTED TOTAL HYSTERECTOMY WITH BILATERAL SALPINGO OOPHORECTOMY;  Surgeon: Everitt Amber, MD;  [facility];  Service: Gynecology;  Laterality: Bilateral;  . TOTAL SHOULDER ARTHROPLASTY Left 09/10/2018   Procedure: TOTAL SHOULDER ARTHROPLASTY;  Surgeon: Justice Britain, MD;  Location: [place];  Service: Orthopedics;  Laterality: Left;  145min  . WISDOM TOOTH EXTRACTION      FAMILY HISTORY: family history includes Alcohol abuse in her father, maternal grandfather, mother, and sister; Dementia in her mother; Heart disease in her mother; Kidney cancer in her brother; Prostate cancer (age of onset: 55) in her father.  SOCIAL HISTORY:  reports that she quit smoking about 22 years ago. Her smoking use included cigarettes. She quit after 4.00 years of use. She has never used smokeless tobacco. She reports current alcohol use of about 3.0 standard drinks of alcohol per week. She reports that she does not use drugs.  ALLERGIES: Morphine and related  MEDICATIONS:  Current Outpatient Medications  Medication Sig Dispense Refill  . anastrozole (ARIMIDEX) 1 MG tablet Take 1 tablet (1 mg total) by mouth daily. 90 tablet 4  . HYDROcodone-acetaminophen (NORCO/VICODIN) 5-325 MG tablet TAKE 1 OR 2 TABLETS EVERY 6 HOURS AS NEEDED FOR PAIN. 90 tablet 0  . losartan (COZAAR) 50 MG tablet TAKE 1 TABLET ONCE DAILY. (Patient taking differently: 25 mg.) 90 tablet 1  . traZODone (DESYREL) 50 MG tablet Take 1.5 tablets (75 mg total) by mouth at bedtime as needed for sleep. (Patient taking differently: Take 25 mg by mouth at bedtime as needed for sleep.) 135 tablet 1  . oxyCODONE (OXY IR/ROXICODONE) 5 MG immediate release tablet Take 1 tablet (5 mg total) by mouth every 6 (six) hours as  needed for severe pain. (Patient not taking: Reported on 11/21/2020) 15 tablet 0   No current facility-administered medications for this encounter.    REVIEW OF SYSTEMS: As above   PHYSICAL EXAM:  height is 5' 3.5" (1.613 m) and weight is 124 lb 9.6 oz (56.5 kg). Her tympanic temperature is 97.3 F (36.3 C) (abnormal). Her blood pressure is 182/64 (abnormal) and her pulse is 63. Her respiration is 18 and oxygen saturation is 100%.   General: Alert and oriented, in no acute distress HEENT: Head is normocephalic.  Psychiatric: Judgment and insight are intact. Affect is appropriate. Breasts: left breast, axilla: satisfactory healing of surgical scars with visible seroma (moderate) bulging from axilla .    ECOG = 0  0 - Asymptomatic (Fully active, able to carry on all predisease activities without restriction)  1 - Symptomatic but completely ambulatory (Restricted in physically strenuous activity but ambulatory and able to carry out work of a light or sedentary nature. For example, light housework, office work)  2 - Symptomatic, <50% in bed during the day (Ambulatory and capable of all self care but unable to carry out any work activities. Up and about more than 50% of waking hours)  3 - Symptomatic, >50% in bed, but not bedbound (Capable of only limited self-care, confined to bed or  chair 50% or more of waking hours)  4 - Bedbound (Completely disabled. Cannot carry on any self-care. Totally confined to bed or chair)  5 - Death   Eustace Pen MM, Creech RH, Tormey DC, et al. [phone]). "Toxicity and response criteria of the [facility]". [place] Oncol. 5 (6): 649-55   LABORATORY DATA:  Lab Results  Component Value Date   WBC 7.5 07/31/2020   HGB 14.1 07/31/2020   HCT 41.8 07/31/2020   MCV 96.3 07/31/2020   PLT 288 07/31/2020   CMP     Component Value Date/Time   NA 140 07/31/2020 1045   K 4.4 07/31/2020 1045   CL 101 07/31/2020 1045   CO2 29 07/31/2020 1045    GLUCOSE 95 07/31/2020 1045   BUN 14 07/31/2020 1045   CREATININE 0.97 (H) 07/31/2020 1045   CALCIUM 10.0 07/31/2020 1045   PROT 7.3 07/31/2020 1045   ALBUMIN 4.5 07/01/2019 0734   AST 21 07/31/2020 1045   ALT 13 07/31/2020 1045   ALKPHOS 62 07/01/2019 0734   BILITOT 0.7 07/31/2020 1045   GFRNONAA >60 09/03/2018 0934   GFRNONAA 70 04/11/2016 1200   GFRAA >60 09/03/2018 0934   GFRAA 80 04/11/2016 1200        RADIOGRAPHY: as above     IMPRESSION/PLAN: Left breast cancer   For the patient's early stage favorable risk breast cancer, we had a thorough discussion about her options for adjuvant therapy. One option would be antiestrogen therapy as she is taking. She will take this for approximately 5 years. The more aggressive option would be to pursue both antiestogens and radiotherapy.  Of note, I discussed the data from the [employer] al trial in the [place] of Medicine. She understands that tamoxifen compared to radiation plus tamoxifen demonstrated no survival benefit among the women in this study. The women were 67 years or older with stage I estrogen receptor positive breast cancer. Based on this study, I told the patient that her overall life expectancy should not be affected by adding radiotherapy to antiestrogen medication. She understands that the main benefit of  adding radiotherapy to anti estrogen therapy would be a very small but measurable local control benefit (risk of local recurrence to be lowered from ~9-10% --> ~2-3% over a decade).  We discussed the fact that radiotherapy only provides a local control benefit while anti-estrogen pills provide systemic coverage. That being said, the risk of systemic failure is relatively low with her type of breast cancer.  She would like to proceed with radiation therapy.  We discussed the risks benefits and side effects of radiotherapy. She understands that the side effects would likely include some skin irritation and fatigue  during the weeks of radiation. There is a risk of late effects which include but are not necessarily limited to cosmetic changes and rare lung toxicity. Likelihood of heart toxicity is extremely small due to our technology and treatment techniques.  She will receive 16 treatments to the whole left breast.   Consent signed today - will schedule CT simulation in the near future.  On date of service, in total, I spent 30 minutes on this encounter. Patient was seen in person.   __________________________________________   Eppie Gibson, MD  This document serves as a record of services personally performed by Eppie Gibson, MD. It was created on his behalf by Clerance Lav, a trained medical scribe. The creation of this record is based on the scribe's personal observations  and the provider's statements to them. This document has been checked and approved by the attending provider.

## 2020-11-21 ENCOUNTER — Ambulatory Visit
Admission: RE | Admit: 2020-11-21 | Discharge: 2020-11-21 | Disposition: A | Discharge status: Home / Self Care | Payer: Medicare Other | Source: Ambulatory Visit | Attending: Radiation Oncology | Admitting: Radiation Oncology

## 2020-11-21 ENCOUNTER — Other Ambulatory Visit: Payer: Self-pay

## 2020-11-21 ENCOUNTER — Encounter: Payer: Self-pay | Admitting: Radiation Oncology

## 2020-11-21 VITALS — BP 182/64 | HR 63 | Temp 97.3°F | Resp 18 | Ht 63.5 in | Wt 124.6 lb

## 2020-11-21 DIAGNOSIS — Z170 Estrogen receptor positive status [ER+]: Secondary | ICD-10-CM

## 2020-11-21 DIAGNOSIS — C50412 Malignant neoplasm of upper-outer quadrant of left female breast: Secondary | ICD-10-CM

## 2020-11-21 DIAGNOSIS — Z87891 Personal history of nicotine dependence: Secondary | ICD-10-CM | POA: Insufficient documentation

## 2020-11-21 DIAGNOSIS — Z90722 Acquired absence of ovaries, bilateral: Secondary | ICD-10-CM | POA: Insufficient documentation

## 2020-11-21 DIAGNOSIS — R011 Cardiac murmur, unspecified: Secondary | ICD-10-CM | POA: Diagnosis not present

## 2020-11-21 DIAGNOSIS — Z90710 Acquired absence of both cervix and uterus: Secondary | ICD-10-CM | POA: Insufficient documentation

## 2020-11-21 DIAGNOSIS — F419 Anxiety disorder, unspecified: Secondary | ICD-10-CM | POA: Diagnosis not present

## 2020-11-21 DIAGNOSIS — Z923 Personal history of irradiation: Secondary | ICD-10-CM | POA: Diagnosis not present

## 2020-11-21 DIAGNOSIS — Z79899 Other long term (current) drug therapy: Secondary | ICD-10-CM | POA: Insufficient documentation

## 2020-11-21 DIAGNOSIS — I10 Essential (primary) hypertension: Secondary | ICD-10-CM | POA: Insufficient documentation

## 2020-11-21 DIAGNOSIS — E785 Hyperlipidemia, unspecified: Secondary | ICD-10-CM | POA: Insufficient documentation

## 2020-11-21 DIAGNOSIS — M129 Arthropathy, unspecified: Secondary | ICD-10-CM | POA: Insufficient documentation

## 2020-11-22 ENCOUNTER — Telehealth: Payer: Self-pay | Admitting: Oncology

## 2020-11-22 ENCOUNTER — Encounter: Payer: Self-pay | Admitting: Radiation Oncology

## 2020-11-22 ENCOUNTER — Encounter: Payer: Self-pay | Admitting: Family Medicine

## 2020-11-22 NOTE — Telephone Encounter (Signed)
Called pt per 2/22 sch msg :  Scheduling Message Entered by Nena Polio on 11/21/2020 at 3:25 PM Priority: Routine <No visit type provided>  Department: [facility]  Provider: Chauncey Cruel, MD  Scheduling Notes:  Genetics per Dr. Isidore Moos    Left message for patient to call back to schedule genetics appt

## 2020-11-23 ENCOUNTER — Encounter: Payer: Self-pay | Admitting: Rehabilitation

## 2020-11-23 ENCOUNTER — Ambulatory Visit: Payer: Medicare Other | Admitting: Rehabilitation

## 2020-11-23 ENCOUNTER — Telehealth: Payer: Self-pay | Admitting: Genetic Counselor

## 2020-11-23 ENCOUNTER — Encounter: Payer: Self-pay | Admitting: Oncology

## 2020-11-23 ENCOUNTER — Other Ambulatory Visit: Payer: Self-pay

## 2020-11-23 DIAGNOSIS — Z483 Aftercare following surgery for neoplasm: Secondary | ICD-10-CM | POA: Diagnosis not present

## 2020-11-23 DIAGNOSIS — Z170 Estrogen receptor positive status [ER+]: Secondary | ICD-10-CM

## 2020-11-23 DIAGNOSIS — R600 Localized edema: Secondary | ICD-10-CM | POA: Diagnosis not present

## 2020-11-23 DIAGNOSIS — R293 Abnormal posture: Secondary | ICD-10-CM | POA: Diagnosis not present

## 2020-11-23 DIAGNOSIS — C50412 Malignant neoplasm of upper-outer quadrant of left female breast: Secondary | ICD-10-CM | POA: Diagnosis not present

## 2020-11-23 DIAGNOSIS — M25512 Pain in left shoulder: Secondary | ICD-10-CM

## 2020-11-23 NOTE — Telephone Encounter (Signed)
Called Ms. Boyce Medici per Asa Lente message to discuss questions that she had regarding genetics.  LVM with contact information requesting call back if she had genetics-related questions.

## 2020-11-23 NOTE — Therapy (Signed)
[place], [place], [address] Phone: [phone]   Fax:  [phone]  Physical Therapy Treatment  Patient Details  Name: Dawn Powers MRN: [id number] Date of Birth: 08/16/1946 Referring Provider (PT): Dr. Jana Hakim   Encounter Date: 11/23/2020   PT End of Session - 11/23/20 1154    Visit Number 8    Number of Visits 12    Date for PT Re-Evaluation 12/11/20    PT Start Time 1103    PT Stop Time 1157    PT Time Calculation (min) 54 min    Activity Tolerance Patient tolerated treatment well    Behavior During Therapy [facility] for tasks assessed/performed           Past Medical History:  Diagnosis Date  . Anxiety   . Arthritis   . Breast cancer ([place]) 09/19/2020   left breast IMC  . Cancer [facility])    Endometrial   . Heart murmur   . Hyperlipidemia   . Hypertension   . Neuromuscular disorder ([place])    DDD  . Pain    lower back -hx "DDD"  . Radiation 11/15/15, 11/23/15, 11/30/15, 12/07/15, 12/14/15   HDR brachytherapy    Past Surgical History:  Procedure Laterality Date  . ABDOMINAL HYSTERECTOMY    . BREAST LUMPECTOMY WITH RADIOACTIVE SEED AND SENTINEL LYMPH NODE BIOPSY Left 10/19/2020   Procedure: LEFT BREAST LUMPECTOMY WITH RADIOACTIVE SEED AND SENTINEL LYMPH NODE BIOPSY;  Surgeon: Stark Klein, MD;  Location: [facility];  Service: General;  Laterality: Left;  [place], RNFA  . COLONOSCOPY    . FRACTURE SURGERY Right    ankle  . GANGLION CYST EXCISION    . LYMPH NODE BIOPSY N/A 09/26/2015   Procedure: SENTINEL LYMPH NODE BIOPSY;  Surgeon: Everitt Amber, MD;  [facility];  Service: Gynecology;  Laterality: N/A;  . ROBOTIC ASSISTED TOTAL HYSTERECTOMY WITH BILATERAL SALPINGO OOPHERECTOMY Bilateral 09/26/2015   Procedure: ROBOTIC ASSISTED TOTAL HYSTERECTOMY WITH BILATERAL SALPINGO OOPHORECTOMY;  Surgeon: Everitt Amber, MD;  [facility];  Service: Gynecology;  Laterality: Bilateral;  .  TOTAL SHOULDER ARTHROPLASTY Left 09/10/2018   Procedure: TOTAL SHOULDER ARTHROPLASTY;  Surgeon: Justice Britain, MD;  Location: [facility];  Service: Orthopedics;  Laterality: Left;  163min  . WISDOM TOOTH EXTRACTION      There were no vitals filed for this visit.   Subjective Assessment - 11/23/20 1104    Subjective I am walking pretty normally.  I get the simulation on Monday.    Pertinent History Pt. presents to PT s/p Left Breast Lumpectomy with SLNB on 10/19/2020.  She was diagnosed during a routine mammogram.  She has a history of Edometrial CA. with hysterectomy in 2016 and radiation in 2017. She had 3 LN removed.  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  She has not had pathology report yet.  She has a left TSA    Patient Stated Goals Check breast, axilla for swelling, ROM    Currently in Pain? No/denies                             [facility] Adult PT Treatment/Exercise - 11/23/20 0001      Manual Therapy   Manual therapy comments assessed goals    Myofascial Release MFR to areas of cording at axilla in arm overhead position    Manual Lymphatic Drainage (MLD) Pt. performed all  MLD with VC's TC's by PT as needed in supine.short neck,  bil axillary LN, Left inguinal LN, anterior interaxillary anastomosis, left axillo-inguinal pathway,  Left breast /left axilla,  and left upper arm lateral, then medial to lateral and lateral arm and retracing pathways in supine and ending with LN's.    Passive ROM Gentle PROM left shoulder in flex, scaption, abduction, and ER                    PT Short Term Goals - 10/30/20 1632      PT SHORT TERM GOAL #1   Title Pt will be independent with HEP for left shoulder ROM/ strength    Time 4    Period Weeks    Status New             PT Long Term Goals - 11/23/20 1108      PT LONG TERM GOAL #1   Title Pt will be independent in self Manual Lymph drainage to decrease swelling    Baseline pt feels more and  more comfortable each session with self MLD - 90%    Status Partially Met      PT LONG TERM GOAL #2   Title Pt will be fit for compression bra/sleeve prn    Baseline Pt has not gotten a compression bra yet and has prescription - pt did not like the sleeve and will wait    Status Partially Met      PT LONG TERM GOAL #3   Title Left shoulder AROM returned to normal pre surgery ROM per pt. (has left TSA)    Baseline pt feels like motion is back    Status Achieved      PT LONG TERM GOAL #4   Title Left chest/trunk swelling will be reduced greater than 50% per pt and will have no discomfort    Baseline Pt feeling like the swelling and discomfort is improved by 50%    Status Achieved                 Plan - 11/23/20 1154    Clinical Impression Statement Pt is walking normally and feeling good about the status of the ankle.  Seroma still present but smaller and overall axillary edema also smaller and less puffy.  Cording also still present but not as taut.    PT Frequency 2x / week    PT Duration 6 weeks    PT Treatment/Interventions ADLs/Self Care Home Management;Therapeutic activities;Therapeutic exercise;Neuromuscular re-education;Manual techniques;Patient/family education;Orthotic Fit/Training;Manual lymph drainage;Passive range of motion;Scar mobilization    PT Next Visit Plan cont Lt axillary and breast/upper arm MLD, MFR to cording at axilla, PROM left shoulder, supine TBand    PT Home Exercise Plan 4 post op exs, adding wrist extension to qi qong movements    Consulted and Agree with Plan of Care Patient           Patient will benefit from skilled therapeutic intervention in order to improve the following deficits and impairments:     Visit Diagnosis: Localized edema  Abnormal posture  Aftercare following surgery for neoplasm  Left shoulder pain, unspecified chronicity  Malignant neoplasm of upper-outer quadrant of left breast in female, estrogen receptor positive  ([place])     Problem List Patient Active Problem List   Diagnosis Date Noted  . Malignant neoplasm of upper-outer quadrant of left breast in female, estrogen receptor positive ([place]) 09/21/2020  . Prediabetes 07/02/2019  . S/P shoulder replacement,  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  . Endometrial cancer ([place]) 09/26/2015  . Essential hypertension 05/23/2015  . Thoracic facet joint syndrome 04/27/2014  . Back pain 03/24/2014  . Muscle spasm 03/24/2014  . Scoliosis (and kyphoscoliosis), idiopathic 12/22/2012  . Osteopenia 12/22/2012  . Other and unspecified hyperlipidemia 12/22/2012    Stark Bray 11/23/2020, 11:58 AM  [place], [place], [address] Phone: [phone]   Fax:  [phone]  Name: Dawn Powers MRN: [id number] Date of Birth: 24-Jan-1946

## 2020-11-24 ENCOUNTER — Other Ambulatory Visit: Payer: Self-pay

## 2020-11-24 ENCOUNTER — Telehealth: Payer: Self-pay | Admitting: Genetic Counselor

## 2020-11-24 ENCOUNTER — Telehealth: Payer: Self-pay

## 2020-11-24 ENCOUNTER — Encounter: Payer: Self-pay | Admitting: Genetic Counselor

## 2020-11-24 DIAGNOSIS — C50412 Malignant neoplasm of upper-outer quadrant of left female breast: Secondary | ICD-10-CM

## 2020-11-24 DIAGNOSIS — Z170 Estrogen receptor positive status [ER+]: Secondary | ICD-10-CM

## 2020-11-24 NOTE — Telephone Encounter (Signed)
Patient called earlier in the day requesting advice on the value of pursuing genetic counseling. She also wanted Dr. Isidore Moos to be aware that the seroma she had developed after surgery appeared to be resolving on its own, and she was worried that radiation would cause the area to inflame again. She requested a call back to review both concerns.   Noticed that someone from the genetic counseling department had contacted patient later that morning and seemed to have addressed her questions regarding usefulness of pursuing genetic testing. Relayed patient's other question to Dr. Isidore Moos who advised that if seroma is resolving on its own, it is highly unlikely that radiation will exacerbate the area. However, if patient would feel more comfortable delaying her start date by week, Dr. Isidore Moos felt that was perfectly acceptable (team would still simulate her as planned on 11/27/20, just move her start date by a week).   Returned patient's call and relayed Dr. Pearlie Oyster advice/recommendations. Patient stated she would like to think about the matter over the weekend, and would have a decision made by Monday morning. Patient confirmed that all her questions/concerns about genetic testing had been addressed earlier this morning. She verbalized appreciation of call, and denied any other needs at this time. She knows to call clinic back in the meantime should something arise.   Updated therapists in [place] of possible delayed start date of treatment

## 2020-11-24 NOTE — Telephone Encounter (Signed)
Spoke with Dawn Powers regarding questions she had about genetic testing.  Briefly discussed the benefits of genetic testing in terms of cancer surveillance and family implications.  Also discussed cost of genetic testing.  She is interested in proceeding with a genetic counseling appointments but wishes to have her radiation schedule set up prior to scheduling a genetics appointment.  Dawn Powers knows to reach out to a [facility] scheduler or myself directly when she is ready to schedule a genetic counseling appointment.

## 2020-11-24 NOTE — Telephone Encounter (Signed)
Received a genetic counseling referral from Dr. Isidore Moos for breast cancer. Ms. Piscopo has been scheduled to see Santiago Glad on 3/7 at [place] from [place] will notify the pt.

## 2020-11-27 ENCOUNTER — Ambulatory Visit
Admission: RE | Admit: 2020-11-27 | Discharge: 2020-11-27 | Disposition: A | Discharge status: Home / Self Care | Payer: Medicare Other | Source: Ambulatory Visit | Attending: Radiation Oncology | Admitting: Radiation Oncology

## 2020-11-27 ENCOUNTER — Other Ambulatory Visit: Payer: Self-pay

## 2020-11-27 ENCOUNTER — Encounter: Payer: Self-pay | Admitting: *Deleted

## 2020-11-27 DIAGNOSIS — Z170 Estrogen receptor positive status [ER+]: Secondary | ICD-10-CM | POA: Insufficient documentation

## 2020-11-27 DIAGNOSIS — C50412 Malignant neoplasm of upper-outer quadrant of left female breast: Secondary | ICD-10-CM | POA: Insufficient documentation

## 2020-11-27 DIAGNOSIS — G8929 Other chronic pain: Secondary | ICD-10-CM | POA: Diagnosis not present

## 2020-11-27 DIAGNOSIS — M5459 Other low back pain: Secondary | ICD-10-CM | POA: Diagnosis not present

## 2020-11-28 ENCOUNTER — Telehealth: Payer: Self-pay

## 2020-11-28 ENCOUNTER — Ambulatory Visit: Payer: Medicare Other | Attending: Oncology

## 2020-11-28 ENCOUNTER — Telehealth: Payer: Self-pay | Admitting: Oncology

## 2020-11-28 DIAGNOSIS — Z483 Aftercare following surgery for neoplasm: Secondary | ICD-10-CM

## 2020-11-28 DIAGNOSIS — M25512 Pain in left shoulder: Secondary | ICD-10-CM

## 2020-11-28 DIAGNOSIS — R293 Abnormal posture: Secondary | ICD-10-CM

## 2020-11-28 DIAGNOSIS — Z170 Estrogen receptor positive status [ER+]: Secondary | ICD-10-CM | POA: Diagnosis not present

## 2020-11-28 DIAGNOSIS — R600 Localized edema: Secondary | ICD-10-CM | POA: Diagnosis not present

## 2020-11-28 DIAGNOSIS — C50412 Malignant neoplasm of upper-outer quadrant of left female breast: Secondary | ICD-10-CM | POA: Insufficient documentation

## 2020-11-28 NOTE — Telephone Encounter (Signed)
Scheduled appt per 2/28 sch msg - mailed letter with appt date and time

## 2020-11-28 NOTE — Therapy (Signed)
[place], [place], [address] Phone: [phone]   Fax:  [phone]  Physical Therapy Treatment  Patient Details  Name: Dawn Powers MRN: [id number] Date of Birth: 12-30-1945 Referring Provider (PT): Dr. Jana Hakim   Encounter Date: 11/28/2020   PT End of Session - 11/28/20 1210    Visit Number 9    Number of Visits 12    Date for PT Re-Evaluation 12/11/20    Authorization Type [facility]    PT Start Time 1102    PT Stop Time 9381    PT Time Calculation (min) 54 min    Activity Tolerance Patient tolerated treatment well           Past Medical History:  Diagnosis Date  . Anxiety   . Arthritis   . Breast cancer ([place]) 09/19/2020   left breast IMC  . Cancer [facility])    Endometrial   . Heart murmur   . Hyperlipidemia   . Hypertension   . Neuromuscular disorder ([place])    DDD  . Pain    lower back -hx "DDD"  . Radiation 11/15/15, 11/23/15, 11/30/15, 12/07/15, 12/14/15   HDR brachytherapy    Past Surgical History:  Procedure Laterality Date  . ABDOMINAL HYSTERECTOMY    . BREAST LUMPECTOMY WITH RADIOACTIVE SEED AND SENTINEL LYMPH NODE BIOPSY Left 10/19/2020   Procedure: LEFT BREAST LUMPECTOMY WITH RADIOACTIVE SEED AND SENTINEL LYMPH NODE BIOPSY;  Surgeon: Stark Klein, MD;  Location: [facility];  Service: General;  Laterality: Left;  [place], RNFA  . COLONOSCOPY    . FRACTURE SURGERY Right    ankle  . GANGLION CYST EXCISION    . LYMPH NODE BIOPSY N/A 09/26/2015   Procedure: SENTINEL LYMPH NODE BIOPSY;  Surgeon: Everitt Amber, MD;  [facility];  Service: Gynecology;  Laterality: N/A;  . ROBOTIC ASSISTED TOTAL HYSTERECTOMY WITH BILATERAL SALPINGO OOPHERECTOMY Bilateral 09/26/2015   Procedure: ROBOTIC ASSISTED TOTAL HYSTERECTOMY WITH BILATERAL SALPINGO OOPHORECTOMY;  Surgeon: Everitt Amber, MD;  [facility];  Service: Gynecology;  Laterality: Bilateral;  . TOTAL SHOULDER ARTHROPLASTY Left  09/10/2018   Procedure: TOTAL SHOULDER ARTHROPLASTY;  Surgeon: Justice Britain, MD;  Location: [facility];  Service: Orthopedics;  Laterality: Left;  11min  . WISDOM TOOTH EXTRACTION      There were no vitals filed for this visit.   Subjective Assessment - 11/28/20 1101    Subjective I had the radiation simulation yesterday and I had a panic attack. It was awful. I am so sore and stiff today because I had to keep it there for so long. Had accupuncture on my back after that so I am sore all over.    Pertinent History Pt. presents to PT s/p Left Breast Lumpectomy with SLNB on 10/19/2020.  She was diagnosed during a routine mammogram.  She has a history of Edometrial CA. with hysterectomy in 2016 and radiation in 2017. She had 3 LN removed.  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  She has not had pathology report yet.  She has a left TSA    Patient Stated Goals Check breast, axilla for swelling, ROM    Currently in Pain? Yes    Pain Score 6     Pain Location Arm    Pain Orientation Left    Pain Descriptors / Indicators Tender;Sore    Pain Onset 1 to 4 weeks ago    Pain Frequency Constant  Multiple Pain Sites No                             [facility] Adult PT Treatment/Exercise - 11/28/20 0001      Shoulder Exercises: Supine   Other Supine Exercises AROM bilateral UE flex, scaption, horizontal abd x 5      Manual Therapy   Manual therapy comments area of seroma assessed;very red and with increased swelling/pain.  Set up appt for pt to see Dr. Barry Dienes at 2:45 secondary to concerns of infection    Myofascial Release MFR to areas of cording at axilla    Passive ROM Gentle PROM left shoulder in flex, scaption, abduction, and ER                    PT Short Term Goals - 10/30/20 1632      PT SHORT TERM GOAL #1   Title Pt will be independent with HEP for left shoulder ROM/ strength    Time 4    Period Weeks    Status New             PT  Long Term Goals - 11/23/20 1108      PT LONG TERM GOAL #1   Title Pt will be independent in self Manual Lymph drainage to decrease swelling    Baseline pt feels more and more comfortable each session with self MLD - 90%    Status Partially Met      PT LONG TERM GOAL #2   Title Pt will be fit for compression bra/sleeve prn    Baseline Pt has not gotten a compression bra yet and has prescription - pt did not like the sleeve and will wait    Status Partially Met      PT LONG TERM GOAL #3   Title Left shoulder AROM returned to normal pre surgery ROM per pt. (has left TSA)    Baseline pt feels like motion is back    Status Achieved      PT LONG TERM GOAL #4   Title Left chest/trunk swelling will be reduced greater than 50% per pt and will have no discomfort    Baseline Pt feeling like the swelling and discomfort is improved by 50%    Status Achieved                 Plan - 11/28/20 1211    Clinical Impression Statement Pt had a bad experience at radiation yesterday suffering a panic attack and feeling very sore today after the radiation simulation.  Pt with significant redness and swelling in area of seroma that had previously been drained twice.  Did not perform MLD today but did work on cording and gentle PROM.  Pt had no pain with PROM, and did note that most of her pain was in area of seroma.  Unable to take a photo with [facility] becuase phone would not connect. Surgeon's office was contacted and [name] set up an appt for her with Dr. Barry Dienes at 2:45 today because of PT concerns of infection.    Personal Factors and Comorbidities Comorbidity 3+    Comorbidities left breast CA with SLNB, prior endometrial CA, Left TSA, Back spasms    Stability/Clinical Decision Making Evolving/Moderate complexity    Rehab Potential Excellent    PT Frequency 2x / week    PT Duration 6 weeks    PT Treatment/Interventions ADLs/Self Care Home Management;Therapeutic  activities;Therapeutic  exercise;Neuromuscular re-education;Manual techniques;Patient/family education;Orthotic Fit/Training;Manual lymph drainage;Passive range of motion;Scar mobilization    PT Next Visit Plan assess pt area of seroma and see what pt says about MD appt. Will need to hold MLD until redness resolves, MFR to cording, PROM    PT Home Exercise Plan 4 post op exs, adding wrist extension to qi qong movements    Consulted and Agree with Plan of Care Patient           Patient will benefit from skilled therapeutic intervention in order to improve the following deficits and impairments:  Decreased mobility,Decreased strength,Increased edema,Impaired sensation,Postural dysfunction,Decreased scar mobility,Pain,Decreased skin integrity,Decreased knowledge of precautions,Decreased activity tolerance,Impaired UE functional use,Decreased range of motion  Visit Diagnosis: Localized edema  Abnormal posture  Aftercare following surgery for neoplasm  Left shoulder pain, unspecified chronicity  Malignant neoplasm of upper-outer quadrant of left breast in female, estrogen receptor positive ([place])     Problem List Patient Active Problem List   Diagnosis Date Noted  . Malignant neoplasm of upper-outer quadrant of left breast in female, estrogen receptor positive ([place]) 09/21/2020  . Prediabetes 07/02/2019  . 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 Endometrial cancer ([place]) 09/26/2015  . Essential hypertension 05/23/2015  . Thoracic facet joint syndrome 04/27/2014  . Back pain 03/24/2014  . Muscle spasm 03/24/2014  . Scoliosis (and kyphoscoliosis), idiopathic 12/22/2012  . Osteopenia 12/22/2012  . Other and unspecified hyperlipidemia 12/22/2012    Claris Pong 11/28/2020, 12:18 PM  [place], [place], [address] Phone: [phone]   Fax:  [phone]  Name: Dawn Powers MRN: [id number] Date of Birth:  [birth year]  Cheral Almas, PT 11/28/20 12:19 PM

## 2020-11-28 NOTE — Telephone Encounter (Signed)
Pt called in to report that she had an infection at her surgical site Pt has been seen by surgeon / Barry Dienes and is taking an antibiotic.

## 2020-11-29 ENCOUNTER — Other Ambulatory Visit: Payer: Self-pay | Admitting: General Surgery

## 2020-11-29 DIAGNOSIS — N644 Mastodynia: Secondary | ICD-10-CM

## 2020-11-30 ENCOUNTER — Other Ambulatory Visit: Payer: Self-pay

## 2020-11-30 ENCOUNTER — Ambulatory Visit: Payer: Medicare Other | Admitting: Rehabilitation

## 2020-11-30 ENCOUNTER — Ambulatory Visit
Admission: RE | Admit: 2020-11-30 | Discharge: 2020-11-30 | Disposition: A | Discharge status: Home / Self Care | Payer: Medicare Other | Source: Ambulatory Visit | Attending: General Surgery | Admitting: General Surgery

## 2020-11-30 ENCOUNTER — Other Ambulatory Visit: Payer: Self-pay | Admitting: General Surgery

## 2020-11-30 DIAGNOSIS — N611 Abscess of the breast and nipple: Secondary | ICD-10-CM | POA: Diagnosis not present

## 2020-11-30 DIAGNOSIS — N644 Mastodynia: Secondary | ICD-10-CM

## 2020-11-30 DIAGNOSIS — N6489 Other specified disorders of breast: Secondary | ICD-10-CM | POA: Diagnosis not present

## 2020-11-30 DIAGNOSIS — L02412 Cutaneous abscess of left axilla: Secondary | ICD-10-CM | POA: Diagnosis not present

## 2020-12-01 ENCOUNTER — Other Ambulatory Visit: Payer: Self-pay | Admitting: Licensed Clinical Social Worker

## 2020-12-01 DIAGNOSIS — Z170 Estrogen receptor positive status [ER+]: Secondary | ICD-10-CM

## 2020-12-01 DIAGNOSIS — C541 Malignant neoplasm of endometrium: Secondary | ICD-10-CM

## 2020-12-01 DIAGNOSIS — C50412 Malignant neoplasm of upper-outer quadrant of left female breast: Secondary | ICD-10-CM

## 2020-12-03 NOTE — Progress Notes (Signed)
[place]  [phone] [phone]     ID: Dawn Powers DOB: [birth year]  MR#: [id number]  DXI#:[id number]  Patient Care Team: Darreld Mclean, MD as PCP - General (Family Medicine) Maddoxx Burkitt, Virgie Dad, MD as Consulting Physician (Oncology) Stark Klein, MD as Consulting Physician (General Surgery) Everitt Amber, MD as Consulting Physician (Gynecologic Oncology) Gery Pray, MD as Consulting Physician (Radiation Oncology) Meisinger, Sherren Mocha, MD as Consulting Physician (Obstetrics and Gynecology) Rockwell Germany, RN as Nurse Navigator Tressie Ellis, Paulette Blanch, RN as Nurse Navigator Chauncey Cruel, MD OTHER MD:  CHIEF COMPLAINT: Estrogen receptor positive breast cancer  CURRENT TREATMENT: Awaiting definitive surgery    INTERVAL HISTORY: Dawn Powers returns today for follow-up of her estroen-receptor positive breast cancer accompanied by her daughter Jinny Sanders   Since the last visit here she underwent left lumpectomy and sentinel node sampling for a 1.2 cm, 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  She has developed a post-operative infection.  She was started on doxycycline by Dr. Barry Dienes but after a week she is tells me the redness is actually worse.  She is very concerned about this.  REVIEW OF SYSTEMS: In addition she has developed seromas in the left axilla.  This was drained once by Dr. Barry Dienes and at least once by Dr. Rosana Hoes.  Part of it is much better but in the axilla there is still a firm mass that is of concern to her and is uncomfortable.  COVID 19 VACCINATION STATUS: Status post Pfizer x2 with booster August 2021  HISTORY OF CURRENT ILLNESS:   From the original intake note:  Dawn Powers had routine screening  mammography on 08/18/2020 showing a possible abnormality in the left breast. She underwent left diagnostic mammography with tomography and left breast ultrasonography at [facility] on 09/06/2020 showing: breast density category B; palpable 1.1 cm mass at 3 o'clock; normal-appearing left axillary lymph nodes.  Accordingly on 09/19/2020 she proceeded to biopsy of the left breast area in question. The pathology from this procedure ([id number]) showed: invasive mammary carcinoma, e-cadherin positive, grade 3. Prognostic indicators significant for: estrogen receptor, 95% positive and progesterone receptor, 10% % positive, both with strong staining intensity. Proliferation marker Ki67 at 10%. HER2 equivocal by immunohistochemistry with fluorescent in situ hybridization pending  The patient's subsequent history is as detailed below.    PAST MEDICAL HISTORY: Past Medical History:  Diagnosis Date  . Anxiety   . Arthritis   . Breast cancer ([place]) 09/19/2020   left breast IMC  . Cancer [facility])    Endometrial   . Family history of breast cancer   . Family history of kidney cancer   . Family history of prostate cancer   . Heart murmur   . Hyperlipidemia   . Hypertension   . Neuromuscular disorder ([place])    DDD  . Pain    lower back -hx "DDD"  . Radiation 11/15/15, 11/23/15, 11/30/15, 12/07/15, 12/14/15   HDR brachytherapy    PAST SURGICAL HISTORY: Past Surgical History:  Procedure Laterality Date  . ABDOMINAL HYSTERECTOMY    . BREAST LUMPECTOMY WITH RADIOACTIVE SEED AND SENTINEL LYMPH NODE BIOPSY Left 10/19/2020   Procedure: LEFT BREAST LUMPECTOMY WITH RADIOACTIVE SEED AND SENTINEL LYMPH NODE BIOPSY;  Surgeon: Stark Klein, MD;  Location: [facility];  Service: General;  Laterality: Left;  PEC BLOCK, RNFA  . COLONOSCOPY    . FRACTURE SURGERY Right    ankle  . GANGLION CYST EXCISION    . LYMPH NODE BIOPSY N/A 09/26/2015   Procedure: SENTINEL LYMPH NODE BIOPSY;  Surgeon: Everitt Amber, MD;  Location: [facility];  Service: Gynecology;  Laterality: N/A;  . ROBOTIC ASSISTED TOTAL HYSTERECTOMY WITH BILATERAL SALPINGO OOPHERECTOMY Bilateral 09/26/2015   Procedure: ROBOTIC ASSISTED TOTAL HYSTERECTOMY WITH BILATERAL SALPINGO OOPHORECTOMY;  Surgeon: Everitt Amber, MD;  Location: [facility];  Service: Gynecology;  Laterality: Bilateral;  . TOTAL SHOULDER ARTHROPLASTY Left 09/10/2018   Procedure: TOTAL SHOULDER ARTHROPLASTY;  Surgeon: Justice Britain, MD;  Location: [place];  Service: Orthopedics;  Laterality: Left;  161min  . WISDOM TOOTH EXTRACTION      FAMILY HISTORY: Family History  Problem Relation Age of Onset  . Heart disease Mother   . Alcohol abuse Mother        breast cancer - questionable   . Dementia Mother   . Alcohol abuse Father   . Prostate cancer Father 64       prostate and colon- unsure of primary   . Kidney cancer Brother 61  . Alcohol abuse Maternal Grandfather   . Alcohol abuse Sister   . Basal cell carcinoma Sister   . Breast cancer Cousin        double mastectomy, d. 32  . Prostate cancer Brother 99  . Esophageal cancer Neg Hx   . Stomach cancer Neg Hx   . Rectal cancer Neg Hx   The patient's father died with prostate cancer at the age of 6.  The patient's mother died from heart disease at the age of 75.  She had had a mastectomy for unclear reasons.  The patient has 1 sister in good health and 2 brothers 1 of whom has had prostate cancer   GYNECOLOGIC HISTORY:  No LMP recorded. Patient has had a hysterectomy. Menarche: 75 years old Age at first live birth: 75 years old GX P 3 LMP 1 HRT about 2 years Hysterectomy? Yes, 08/2015 for endometrial cancer BSO? 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  Her husband Elberta Fortis is a retired Administrator, teaching in the modern languages in film department.  At home it is just the 2 of them plus their hound dog.  Daughter Dawn Powers "the librarian" works at [employer].   Second child Dawn Powers died at age 33.  Daughter Dawn Powers lives in [place].  The patient has 2 grandchildren and attends [place]    ADVANCED DIRECTIVES: In the absence of any documentation to the contrary, the patient's spouse is their HCPOA.    HEALTH MAINTENANCE: Social History   Tobacco Use  . Smoking status: Former Smoker    Years: 4.00    Types: Cigarettes    Quit date: 09/30/1998    Years since quitting: 22.1  . Smokeless tobacco: Never Used  Vaping Use  . Vaping Use: Never used  Substance Use Topics  . Alcohol use: Yes    Alcohol/week: 3.0 standard drinks    Types: 3 Standard drinks or equivalent per week    Comment: 5 drinks per week  . Drug use: No     Colonoscopy: 08/2015 (Dr. Carlean Purl), repeat not indicated due to age  PAP: Status post hysterectomy  Bone density: 08/2009, -2.0   Allergies  Allergen Reactions  . Morphine And Related Hives  Current Outpatient Medications  Medication Sig Dispense Refill  . amoxicillin-clavulanate (AUGMENTIN) 875-125 MG tablet Take 1 tablet by mouth 2 (two) times daily. 14 tablet 1  . anastrozole (ARIMIDEX) 1 MG tablet Take 1 tablet (1 mg total) by mouth daily. 90 tablet 4  . losartan (COZAAR) 50 MG tablet TAKE 1 TABLET ONCE DAILY. (Patient taking differently: 25 mg.) 90 tablet 1  . traZODone (DESYREL) 50 MG tablet Take 1.5 tablets (75 mg total) by mouth at bedtime as needed for sleep. (Patient taking differently: Take 25 mg by mouth at bedtime as needed for sleep.) 135 tablet 1   No current facility-administered medications for this visit.    OBJECTIVE: White woman who appears younger than stated age  75:   12/04/20 1148  BP: (!) 177/66  Pulse: 67  Resp: 18  Temp: 97.7 F (36.5 C)  SpO2: 100%     Body mass index is 20.31 kg/m.   Wt Readings from Last 3 Encounters:  12/04/20 123 lb 3.2 oz (55.9 kg)  11/21/20 124 lb 9.6 oz (56.5 kg)  10/19/20 123 lb 3.8 oz (55.9 kg)      ECOG FS:1 - Symptomatic but completely  ambulatory  Ocular: Sclerae unicteric, pupils round and equal Ear-nose-throat: Wearing a mask Lymphatic: No cervical or supraclavicular adenopathy Lungs no rales or rhonchi Heart regular rate and rhythm Abd soft, nontender, positive bowel sounds MSK no focal spinal tenderness, no joint edema Neuro: non-focal, well-oriented, appropriate affect Breasts: The left breast is status post lumpectomy.  It is imaged below.  There is significant cellulitis.  In the left axilla there is a 2 cm firm mass consistent with a seroma.  Left breast 12/04/2020     LAB RESULTS:  CMP     Component Value Date/Time   NA 136 12/04/2020 1110   K 4.2 12/04/2020 1110   CL 102 12/04/2020 1110   CO2 26 12/04/2020 1110   GLUCOSE 98 12/04/2020 1110   BUN 14 12/04/2020 1110   CREATININE 0.89 12/04/2020 1110   CREATININE 0.97 (H) 07/31/2020 1045   CALCIUM 9.7 12/04/2020 1110   PROT 7.3 12/04/2020 1110   ALBUMIN 3.9 12/04/2020 1110   AST 18 12/04/2020 1110   ALT 11 12/04/2020 1110   ALKPHOS 60 12/04/2020 1110   BILITOT 0.4 12/04/2020 1110   GFRNONAA >60 12/04/2020 1110   GFRNONAA 70 04/11/2016 1200   GFRAA >60 09/03/2018 0934   GFRAA 80 04/11/2016 1200    Lab Results  Component Value Date   TOTALPROTELP 6.5 07/20/2014   TOTALPROTELP 6.5 07/20/2014   ALBUMINELP 63.3 07/20/2014   A1GS 4.7 07/20/2014   A2GS 11.4 07/20/2014   BETS 5.7 07/20/2014   BETA2SER 4.2 07/20/2014   GAMS 10.7 (L) 07/20/2014   MSPIKE NOT DET 07/20/2014   SPEI SEE NOTE 07/20/2014    Lab Results  Component Value Date   WBC 5.6 12/04/2020   NEUTROABS 3.6 12/04/2020   HGB 12.6 12/04/2020   HCT 37.7 12/04/2020   MCV 96.4 12/04/2020   PLT 299 12/04/2020    No results found for: LABCA2  No components found for: VFIEPP295  No results for input(s): INR in the last 168 hours.  No results found for: LABCA2  No results found for: JOA416  No results found for: SAY301  No results found for: SWF093  No results found  for: CA2729  No components found for: HGQUANT  No results found for: CEA1 / No results found for: CEA1   No results found  for: AFPTUMOR  No results found for: CHROMOGRNA  No results found for: KPAFRELGTCHN, LAMBDASER, KAPLAMBRATIO (kappa/lambda light chains)  No results found for: HGBA, HGBA2QUANT, HGBFQUANT, HGBSQUAN (Hemoglobinopathy evaluation)   No results found for: LDH  No results found for: IRON, TIBC, IRONPCTSAT (Iron and TIBC)  No results found for: FERRITIN  Urinalysis    Component Value Date/Time   COLORURINE YELLOW 09/19/2015 1400   APPEARANCEUR CLEAR 09/19/2015 1400   LABSPEC 1.023 09/19/2015 1400   PHURINE 7.0 09/19/2015 1400   GLUCOSEU NEGATIVE 09/19/2015 1400   HGBUR NEGATIVE 09/19/2015 1400   BILIRUBINUR negative 11/06/2016 1411   BILIRUBINUR neg 05/23/2015 1003   KETONESUR negative 11/06/2016 1411   KETONESUR NEGATIVE 09/19/2015 1400   PROTEINUR negative 11/06/2016 1411   PROTEINUR NEGATIVE 09/19/2015 1400   UROBILINOGEN negative 11/06/2016 1411   NITRITE Negative 11/06/2016 1411   NITRITE NEGATIVE 09/19/2015 1400   LEUKOCYTESUR Negative 11/06/2016 1411     STUDIES: US BREAST LTD UNI LEFT INC AXILLA  Result Date: 11/30/2020 CLINICAL DATA:  Patient with history of left lumpectomy and left axillary lymph node removal. Patient has seromas at both sites. Patient now presents with cellulitis involving the outer left breast. Evaluate for infection of the associated collections. EXAM: ULTRASOUND OF THE LEFT BREAST COMPARISON:  Previous exam(s). FINDINGS: Targeted ultrasound is performed, showing a complex collection within the left breast 3-4 o'clock position measuring up to 8 cm. Additionally within the left axilla there is a 4.1 x 1.9 x 6.3 cm complex collection. IMPRESSION: Collection within the left breast 3-4 o'clock position and left axilla. These likely represent seromas. Superimposed infection not excluded. RECOMMENDATION: Ultrasound-guided aspiration  of both collections within the left breast 3 o'clock position and left axilla. Continue antibiotic therapy. Clinical follow-up until resolution. I have discussed the findings and recommendations with the patient. If applicable, a reminder letter will be sent to the patient regarding the next appointment. BI-RADS CATEGORY  2: Benign. Electronically Signed   By: Lovey Newcomer M.D.   On: 11/30/2020 16:05   US BREAST ASPIRATION LEFT  Result Date: 11/30/2020 CLINICAL DATA:  Patient for ultrasound-guided aspiration of left breast collection and left axillary complex collection. EXAM: ULTRASOUND GUIDED LEFT BREAST CYST ASPIRATION COMPARISON:  Previous exams. PROCEDURE: The patient and I discussed the procedure of ultrasound-guided aspiration including benefits and alternatives. We discussed the high likelihood of a successful procedure. We discussed the risks of the procedure including infection, bleeding, tissue injury, and inadequate sampling. Informed written consent was given. The usual time out protocol was performed immediately prior to the procedure. Site 1: Left breast 3 o'clock position Using sterile technique, 1% lidocaine, under direct ultrasound visualization, needle aspiration of collection left breast 3 o'clock position was performed. Approximately 35 cc of thin sanguinous fluid was aspirated. Fluid was sent to the lab for analysis. Site 2: Left axilla. Using sterile technique, 1% lidocaine, under direct ultrasound visualization, needle aspiration of complex left axillary collection was performed. Approximately 10 cc of thin sanguinous fluid was aspirated. IMPRESSION: Ultrasound-guided aspiration of left breast (3 o'clock) and left axillary collections. No apparent complications. RECOMMENDATIONS: Continued clinical follow-up for suspected cellulitis and seromas within the left breast. Patient can return for additional drainage as clinically warranted. Electronically Signed   By: Lovey Newcomer M.D.   On:  11/30/2020 15:51   US BREAST ASPIRATION LEFT  Result Date: 11/30/2020 CLINICAL DATA:  Patient for ultrasound-guided aspiration of left breast collection and left axillary complex collection. EXAM: ULTRASOUND GUIDED LEFT BREAST CYST ASPIRATION  COMPARISON:  Previous exams. PROCEDURE: The patient and I discussed the procedure of ultrasound-guided aspiration including benefits and alternatives. We discussed the high likelihood of a successful procedure. We discussed the risks of the procedure including infection, bleeding, tissue injury, and inadequate sampling. Informed written consent was given. The usual time out protocol was performed immediately prior to the procedure. Site 1: Left breast 3 o'clock position Using sterile technique, 1% lidocaine, under direct ultrasound visualization, needle aspiration of collection left breast 3 o'clock position was performed. Approximately 35 cc of thin sanguinous fluid was aspirated. Fluid was sent to the lab for analysis. Site 2: Left axilla. Using sterile technique, 1% lidocaine, under direct ultrasound visualization, needle aspiration of complex left axillary collection was performed. Approximately 10 cc of thin sanguinous fluid was aspirated. IMPRESSION: Ultrasound-guided aspiration of left breast (3 o'clock) and left axillary collections. No apparent complications. RECOMMENDATIONS: Continued clinical follow-up for suspected cellulitis and seromas within the left breast. Patient can return for additional drainage as clinically warranted. Electronically Signed   By: Lovey Newcomer M.D.   On: 11/30/2020 15:51     ELIGIBLE FOR AVAILABLE RESEARCH PROTOCOL: AET  ASSESSMENT: 75 y.o. [place] woman status post right breast upper outer quadrant biopsy 09/19/2020 for a clinical T1c N0, stage IA invasive ductal carcinoma, grade 3, estrogen and progesterone receptor positive, with an MIB-1 of 10% and HER-2 equivocal with FISH pending  (1) genetics testing  (2) s/p left  lumpectomy and sentinel lymph node sampling 10/19/2020 for a pT1c pN0, stage IA invasive ductal carcinoma, grade 2, with negative margins   (3) Oncotype score of 15 predicts a risk of recurrence outseide the breast over the next 9 years of 4 % if the patient's oinlysystemic treatment is anti-estrogens; it predicts not benefit from chemotherapy  (4) adjuvant radiation as appropriate  (5) anastrozole started neoadjuvantly 09/21/2020  PLAN: [place] had genetics testing today, with results pending.  The more immediate issue is when to start radiation.  Currently she has cellulitis which is not controlled and I am switching her to Augmentin.  We discussed some of the possible side effects of that medication  In addition she is concerned about the seroma question.  She will be seeing Dr. Barry Dienes tomorrow and I encouraged her to keep that appointment so she can discuss seroma management further with her.  We did discuss the pathophysiology of this, and postoperative complication  We have data that radiation for breast cancer and estrogen receptor positive cases may be delayed for 2 or 2-1/2 months without significant impairment of effectiveness.  Accordingly we have time for her to resolve the infectious issue.  I am going to see her in a week just to make sure everything has cleared.  She will let me know if there is any problem from the Augmentin  Total encounter time 35 minutes.Sarajane Jews C. Casady Voshell, MD 12/04/2020 5:31 PM Medical Oncology and Hematology [facility] [place], [place] [address] Tel. [phone]    Fax. [phone]   This document serves as a record of services personally performed by Lurline Del, MD. It was created on his behalf by Wilburn Mylar, a trained medical scribe. The creation of this record is based on the scribe's personal observations and the provider's statements to them.   I, Lurline Del MD, have reviewed the above  documentation for accuracy and completeness, and I agree with the above.    *Total Encounter Time as defined by the Centers for  Medicare and Medicaid Services includes, in addition to the face-to-face time of a patient visit (documented in the note above) non-face-to-face time: obtaining and reviewing outside history, ordering and reviewing medications, tests or procedures, care coordination (communications with other health care professionals or caregivers) and documentation in the medical record. 

## 2020-12-04 ENCOUNTER — Inpatient Hospital Stay: Payer: Medicare Other

## 2020-12-04 ENCOUNTER — Inpatient Hospital Stay: Payer: Medicare Other | Attending: Oncology | Admitting: Licensed Clinical Social Worker

## 2020-12-04 ENCOUNTER — Encounter: Payer: Self-pay | Admitting: Licensed Clinical Social Worker

## 2020-12-04 ENCOUNTER — Inpatient Hospital Stay (HOSPITAL_BASED_OUTPATIENT_CLINIC_OR_DEPARTMENT_OTHER): Payer: Medicare Other | Admitting: Oncology

## 2020-12-04 ENCOUNTER — Telehealth: Payer: Self-pay | Admitting: Oncology

## 2020-12-04 ENCOUNTER — Other Ambulatory Visit: Payer: Self-pay

## 2020-12-04 VITALS — BP 177/66 | HR 67 | Temp 97.7°F | Resp 18 | Ht 65.3 in | Wt 123.2 lb

## 2020-12-04 DIAGNOSIS — Z170 Estrogen receptor positive status [ER+]: Secondary | ICD-10-CM

## 2020-12-04 DIAGNOSIS — C50412 Malignant neoplasm of upper-outer quadrant of left female breast: Secondary | ICD-10-CM

## 2020-12-04 DIAGNOSIS — Z90710 Acquired absence of both cervix and uterus: Secondary | ICD-10-CM | POA: Insufficient documentation

## 2020-12-04 DIAGNOSIS — I10 Essential (primary) hypertension: Secondary | ICD-10-CM | POA: Insufficient documentation

## 2020-12-04 DIAGNOSIS — C541 Malignant neoplasm of endometrium: Secondary | ICD-10-CM | POA: Diagnosis not present

## 2020-12-04 DIAGNOSIS — Z79899 Other long term (current) drug therapy: Secondary | ICD-10-CM | POA: Diagnosis not present

## 2020-12-04 DIAGNOSIS — Z8051 Family history of malignant neoplasm of kidney: Secondary | ICD-10-CM | POA: Diagnosis not present

## 2020-12-04 DIAGNOSIS — C50411 Malignant neoplasm of upper-outer quadrant of right female breast: Secondary | ICD-10-CM | POA: Insufficient documentation

## 2020-12-04 DIAGNOSIS — Z90722 Acquired absence of ovaries, bilateral: Secondary | ICD-10-CM | POA: Diagnosis not present

## 2020-12-04 DIAGNOSIS — Z9079 Acquired absence of other genital organ(s): Secondary | ICD-10-CM | POA: Insufficient documentation

## 2020-12-04 DIAGNOSIS — Z87891 Personal history of nicotine dependence: Secondary | ICD-10-CM | POA: Diagnosis not present

## 2020-12-04 DIAGNOSIS — Z8042 Family history of malignant neoplasm of prostate: Secondary | ICD-10-CM

## 2020-12-04 DIAGNOSIS — Z79811 Long term (current) use of aromatase inhibitors: Secondary | ICD-10-CM | POA: Insufficient documentation

## 2020-12-04 DIAGNOSIS — Z803 Family history of malignant neoplasm of breast: Secondary | ICD-10-CM | POA: Diagnosis not present

## 2020-12-04 DIAGNOSIS — Z923 Personal history of irradiation: Secondary | ICD-10-CM | POA: Diagnosis not present

## 2020-12-04 LAB — CBC WITH DIFFERENTIAL/PLATELET
Abs Immature Granulocytes: 0.01 10*3/uL (ref 0.00–0.07)
Basophils Absolute: 0.1 10*3/uL (ref 0.0–0.1)
Basophils Relative: 1 %
Eosinophils Absolute: 0.1 10*3/uL (ref 0.0–0.5)
Eosinophils Relative: 1 %
HCT: 37.7 % (ref 36.0–46.0)
Hemoglobin: 12.6 g/dL (ref 12.0–15.0)
Immature Granulocytes: 0 %
Lymphocytes Relative: 26 %
Lymphs Abs: 1.5 10*3/uL (ref 0.7–4.0)
MCH: 32.2 pg (ref 26.0–34.0)
MCHC: 33.4 g/dL (ref 30.0–36.0)
MCV: 96.4 fL (ref 80.0–100.0)
Monocytes Absolute: 0.4 10*3/uL (ref 0.1–1.0)
Monocytes Relative: 7 %
Neutro Abs: 3.6 10*3/uL (ref 1.7–7.7)
Neutrophils Relative %: 64 %
Platelets: 299 10*3/uL (ref 150–400)
RBC: 3.91 MIL/uL (ref 3.87–5.11)
RDW: 12.1 % (ref 11.5–15.5)
WBC: 5.6 10*3/uL (ref 4.0–10.5)
nRBC: 0 % (ref 0.0–0.2)

## 2020-12-04 LAB — COMPREHENSIVE METABOLIC PANEL
ALT: 11 U/L (ref 0–44)
AST: 18 U/L (ref 15–41)
Albumin: 3.9 g/dL (ref 3.5–5.0)
Alkaline Phosphatase: 60 U/L (ref 38–126)
Anion gap: 8 (ref 5–15)
BUN: 14 mg/dL (ref 8–23)
CO2: 26 mmol/L (ref 22–32)
Calcium: 9.7 mg/dL (ref 8.9–10.3)
Chloride: 102 mmol/L (ref 98–111)
Creatinine, Ser: 0.89 mg/dL (ref 0.44–1.00)
GFR, Estimated: >60 mL/min (ref >60)
Glucose, Bld: 98 mg/dL (ref 70–99)
Potassium: 4.2 mmol/L (ref 3.5–5.1)
Sodium: 136 mmol/L (ref 135–145)
Total Bilirubin: 0.4 mg/dL (ref 0.3–1.2)
Total Protein: 7.3 g/dL (ref 6.5–8.1)

## 2020-12-04 LAB — GENETIC SCREENING ORDER

## 2020-12-04 MED ORDER — AMOXICILLIN-POT CLAVULANATE 875-125 MG PO TABS: 1.0000 | ORAL_TABLET | Freq: Two times a day (BID) | ORAL | 1 refills | Status: DC

## 2020-12-04 NOTE — Progress Notes (Signed)
REFERRING PROVIDER: Eppie Gibson, MD [address],  [place] [address]  PRIMARY PROVIDER:  [name], Gay Filler, MD  PRIMARY REASON FOR VISIT:  1. Malignant neoplasm of upper-outer quadrant of left breast in female, estrogen receptor positive ([place])   2. Family history of prostate cancer   3. Family history of breast cancer   4. Family history of kidney cancer   5. Endometrial cancer ([place])      HISTORY OF PRESENT ILLNESS:   Ms. Malatesta, a 75 y.o. female, was seen for a [facility] cancer genetics consultation at the request of Dr. Isidore Moos due to a personal and family history of cancer.  Ms. Casalino presents to clinic today to discuss the possibility of a hereditary predisposition to cancer, genetic testing, and to further clarify her future cancer risks, as well as potential cancer risks for family members.   In 2016, at the age of 65, Ms. Tenpas was diagnosed with endometrial cancer. This was treated with TAH-BSO and radiation.  In 2022, at the age of 43, Ms. 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 The treatment plan included surgery and she plans radiation and antiestrogens.   CANCER HISTORY:  Oncology History  Endometrial cancer ([place])  09/26/2015 Initial Diagnosis   Endometrial cancer ([place])   09/21/2020 Cancer Staging   Staging form: Corpus Uteri - Carcinoma, AJCC 7th Edition - Clinical: FIGO Stage IB (T1b, N0, M0) - Signed by Chauncey Cruel, MD on 09/21/2020   Malignant neoplasm of upper-outer quadrant of left breast in female, estrogen receptor positive ([place])  09/21/2020 Initial Diagnosis   Malignant neoplasm of upper-outer quadrant of left breast in female, estrogen receptor positive ([place])   09/21/2020 Cancer Staging   Staging form: Breast, AJCC 8th Edition - Clinical: Stage IA (cT1c, cN0, cM0, G3, ER+, PR+, HER2: Equivocal) - Signed by Chauncey Cruel, MD on 09/21/2020      RISK FACTORS:  Menarche was at age  78-13.  OCP use for approximately less than  5 years.  Ovaries intact: no.  Hysterectomy: yes.  Menopausal status: postmenopausal.  HRT use: 3 years. Colonoscopy: yes; normal. Mammogram within the last year: yes. Number of breast biopsies: 1.   Past Medical History:  Diagnosis Date  . Anxiety   . Arthritis   . Breast cancer ([place]) 09/19/2020   left breast IMC  . Cancer [facility])    Endometrial   . Family history of breast cancer   . Family history of kidney cancer   . Family history of prostate cancer   . Heart murmur   . Hyperlipidemia   . Hypertension   . Neuromuscular disorder ([place])    DDD  . Pain    lower back -hx "DDD"  . Radiation 11/15/15, 11/23/15, 11/30/15, 12/07/15, 12/14/15   HDR brachytherapy    Past Surgical History:  Procedure Laterality Date  . ABDOMINAL HYSTERECTOMY    . BREAST LUMPECTOMY WITH RADIOACTIVE SEED AND SENTINEL LYMPH NODE BIOPSY Left 10/19/2020   Procedure: LEFT BREAST LUMPECTOMY WITH RADIOACTIVE SEED AND SENTINEL LYMPH NODE BIOPSY;  Surgeon: Stark Klein, MD;  Location: [place];  Service: General;  Laterality: Left;  [place], RNFA  . COLONOSCOPY    . FRACTURE SURGERY Right    ankle  . GANGLION CYST EXCISION    . LYMPH NODE BIOPSY N/A 09/26/2015   Procedure: SENTINEL LYMPH NODE BIOPSY;  Surgeon: Everitt Amber, MD;  [facility];  Service: Gynecology;  Laterality:  N/A;  . ROBOTIC ASSISTED TOTAL HYSTERECTOMY WITH BILATERAL SALPINGO OOPHERECTOMY Bilateral 09/26/2015   Procedure: ROBOTIC ASSISTED TOTAL HYSTERECTOMY WITH BILATERAL SALPINGO OOPHORECTOMY;  Surgeon: Adolphus Birchwood, MD;  [facility];  Service: Gynecology;  Laterality: Bilateral;  . TOTAL SHOULDER ARTHROPLASTY Left 09/10/2018   Procedure: TOTAL SHOULDER ARTHROPLASTY;  Surgeon: Francena Hanly, MD;  [facility];  Service: Orthopedics;  Laterality: Left;   . WISDOM TOOTH EXTRACTION      Social History   Socioeconomic History  . Marital status: Married    Spouse  name: Not on file  . Number of children: 2  . Years of education: Not on file  . Highest education level: Not on file  Occupational History  . Not on file  Tobacco Use  . Smoking status: Former Smoker    Years: 4.00    Types: Cigarettes    Quit date: 09/30/1998    Years since quitting: 22.1  . Smokeless tobacco: Never Used  Vaping Use  . Vaping Use: Never used  Substance and Sexual Activity  . Alcohol use: Yes    Alcohol/week: 3.0 standard drinks    Types: 3 Standard drinks or equivalent per week    Comment: 5 drinks per week  . Drug use: No  . Sexual activity: Yes    Partners: Female    Birth control/protection: Surgical  Other Topics Concern  . Not on file  Social History Narrative   Married. Education: college and Other. Exercise 4 times a week for 50 minutes.   Social Determinants of Health   Financial Resource Strain: Not on file  Food Insecurity: Not on file  Transportation Needs: Not on file  Physical Activity: Not on file  Stress: Not on file  Social Connections: Not on file     FAMILY HISTORY:  We obtained a detailed, 4-generation family history.  Significant diagnoses are listed below: Family History  Problem Relation Age of Onset  . Heart disease Mother   . Alcohol abuse Mother        breast cancer - questionable   . Dementia Mother   . Alcohol abuse Father   . Prostate cancer Father 62       prostate and colon- unsure of primary   . Kidney cancer Brother 78  . Alcohol abuse Maternal Grandfather   . Alcohol abuse Sister   . Basal cell carcinoma Sister   . Breast cancer Cousin        double mastectomy, d. 32  . Prostate cancer Brother 43  . Esophageal cancer Neg Hx   . Stomach cancer Neg Hx   . Rectal cancer Neg Hx    Ms. Waldrip had 1 son and 2 daughters. Her son passed from ileal atresia at 55. Patient has 2 brothers, 1 sister. Her sister has had skin cancer on her nose, BCC. Brother has had kidney cancer at 81 and her other brother had prostate  at 19.  Ms. Barna father had prostate cancer, metastatic and died at 35. He may also have had colon cancer. No other known cancers in paternal relatives.  Ms. Hinds mother possibly had breast cancer in her 32s and died in her late 82s. A maternal cousin had breast cancer and died at 69. No other known cancers on this side of the family.  Ms. Metzger is unaware of previous family history of genetic testing for hereditary cancer risks. Patient's maternal ancestors are of [place] descent, and paternal ancestors are of English/Irish descent. There is no  reported Ashkenazi Jewish ancestry. There is no known consanguinity.    GENETIC COUNSELING ASSESSMENT: Ms. Dieterich is a 75 y.o. female with a personal and family history of cancer which is somewhat suggestive of a hereditary cancer syndrome and predisposition to cancer. We, therefore, discussed and recommended the following at today's visit.   DISCUSSION: We discussed that approximately 5-10% of breast cancer is hereditary  Most cases of hereditary breast cancer are associated with BRCA1/BRCA2 genes, which also increase the risk for prostate cancer, although there are other genes associated with hereditary cancer as well.  We discussed that testing is beneficial for several reasons including knowing about other cancer risks, identifying potential screening and risk-reduction options that may be appropriate, and to understand if other family members could be at risk for cancer and allow them to undergo genetic testing.   We reviewed the characteristics, features and inheritance patterns of hereditary cancer syndromes. We also discussed genetic testing, including the appropriate family members to test, the process of testing, insurance coverage and turn-around-time for results. We discussed the implications of a negative, positive and/or variant of uncertain significant result. We recommended Ms. Shults pursue genetic testing for the [facility]  Multi-Cancer gene panel.   The Multi-Cancer Panel offered by [facility] includes sequencing and/or deletion duplication testing of the following 84 genes: AIP, ALK, APC, ATM, AXIN2,BAP1,  BARD1, BLM, BMPR1A, BRCA1, BRCA2, BRIP1, CASR, CDC73, CDH1, CDK4, CDKN1B, CDKN1C, CDKN2A (p14ARF), CDKN2A (p16INK4a), CEBPA, CHEK2, CTNNA1, DICER1, DIS3L2, EGFR (c.2369C>T, p.Thr790Met variant only), EPCAM (Deletion/duplication testing only), FH, FLCN, GATA2, GPC3, GREM1 (Promoter region deletion/duplication testing only), HOXB13 (c.251G>A, p.Gly84Glu), HRAS, KIT, MAX, MEN1, MET, MITF (c.952G>A, p.Glu318Lys variant only), MLH1, MSH2, MSH3, MSH6, MUTYH, NBN, NF1, NF2, NTHL1, PALB2, PDGFRA, PHOX2B, PMS2, POLD1, POLE, POT1, PRKAR1A, PTCH1, PTEN, RAD50, RAD51C, RAD51D, RB1, RECQL4, RET,  RUNX1, SDHAF2, SDHA (sequence changes only), SDHB, SDHC, SDHD, SMAD4, SMARCA4, SMARCB1, SMARCE1, STK11, SUFU, TERC, TERT, TMEM127, TP53, TSC1, TSC2, VHL, WRN and WT1.   Based on Ms. Wickes's personal and family history of cancer, she meets medical criteria for genetic testing. Despite that she meets criteria, she may still have an out of pocket cost. We discussed that if her out of pocket cost for testing is over $100, the laboratory will call and confirm whether she wants to proceed with testing.  If the out of pocket cost of testing is less than $100 she will be billed by the genetic testing laboratory.   PLAN: After considering the risks, benefits, and limitations, Ms. Roedl provided informed consent to pursue genetic testing and the blood sample was sent to [facility] for analysis of the Multi-Cancer Panel. Results should be available within approximately 2-3 weeks' time, at which point they will be disclosed by telephone to Ms. Brinley, as will any additional recommendations warranted by these results. Ms. Lint will receive a summary of her genetic counseling visit and a copy of her results once available. This information will  also be available in Epic.   Ms. Timoney questions were answered to her satisfaction today. Our contact information was provided should additional questions or concerns arise. Thank you for the referral and allowing [place] to share in the care of your patient.   Faith Rogue, MS, [facility] Genetic Counselor [email or web address] Phone: [phone]  The patient was seen for a total of 30 minutes in face-to-face genetic counseling.  [employer] Intern Magda Paganini was also present. Dr. Grayland Ormond was available for discussion regarding this case.   _______________________________________________________________________ For Office Staff:  Number of  people involved in session: 2 Was an Intern/ student involved with case: yes

## 2020-12-04 NOTE — Telephone Encounter (Signed)
Scheduled appts per 3/7 los. Gave pt a print out of AVS.

## 2020-12-05 ENCOUNTER — Ambulatory Visit: Payer: Medicare Other | Admitting: Radiation Oncology

## 2020-12-05 ENCOUNTER — Ambulatory Visit: Payer: Medicare Other

## 2020-12-05 LAB — AEROBIC/ANAEROBIC CULTURE W GRAM STAIN (SURGICAL/DEEP WOUND)
Culture: NO GROWTH
Gram Stain: NONE SEEN
Special Requests: NORMAL

## 2020-12-06 ENCOUNTER — Ambulatory Visit: Payer: Medicare Other

## 2020-12-07 ENCOUNTER — Ambulatory Visit: Payer: Medicare Other

## 2020-12-07 ENCOUNTER — Ambulatory Visit: Payer: Medicare Other | Admitting: Rehabilitation

## 2020-12-07 ENCOUNTER — Other Ambulatory Visit: Payer: Self-pay

## 2020-12-07 ENCOUNTER — Encounter: Payer: Self-pay | Admitting: Rehabilitation

## 2020-12-07 DIAGNOSIS — R293 Abnormal posture: Secondary | ICD-10-CM

## 2020-12-07 DIAGNOSIS — M25512 Pain in left shoulder: Secondary | ICD-10-CM

## 2020-12-07 DIAGNOSIS — Z170 Estrogen receptor positive status [ER+]: Secondary | ICD-10-CM

## 2020-12-07 DIAGNOSIS — C50412 Malignant neoplasm of upper-outer quadrant of left female breast: Secondary | ICD-10-CM | POA: Diagnosis not present

## 2020-12-07 DIAGNOSIS — Z483 Aftercare following surgery for neoplasm: Secondary | ICD-10-CM

## 2020-12-07 DIAGNOSIS — R600 Localized edema: Secondary | ICD-10-CM | POA: Diagnosis not present

## 2020-12-07 NOTE — Therapy (Signed)
[place], [place], [address] Phone: [phone]   Fax:  [phone]  Physical Therapy Treatment  Patient Details  Name: Dawn Powers MRN: [id number] Date of Birth: 23-Oct-1945 Referring Provider (PT): Dr. Jana Hakim   Encounter Date: 12/07/2020   PT End of Session - 12/07/20 1237    Visit Number 10    Number of Visits 22    Date for PT Re-Evaluation 01/18/21    Authorization - Number of Visits 20    PT Start Time 1105    PT Stop Time 1156    PT Time Calculation (min) 51 min    Activity Tolerance Patient tolerated treatment well    Behavior During Therapy [facility] for tasks assessed/performed           Past Medical History:  Diagnosis Date  . Anxiety   . Arthritis   . Breast cancer ([place]) 09/19/2020   left breast IMC  . Cancer [facility])    Endometrial   . Family history of breast cancer   . Family history of kidney cancer   . Family history of prostate cancer   . Heart murmur   . Hyperlipidemia   . Hypertension   . Neuromuscular disorder ([place])    DDD  . Pain    lower back -hx "DDD"  . Radiation 11/15/15, 11/23/15, 11/30/15, 12/07/15, 12/14/15   HDR brachytherapy    Past Surgical History:  Procedure Laterality Date  . ABDOMINAL HYSTERECTOMY    . BREAST LUMPECTOMY WITH RADIOACTIVE SEED AND SENTINEL LYMPH NODE BIOPSY Left 10/19/2020   Procedure: LEFT BREAST LUMPECTOMY WITH RADIOACTIVE SEED AND SENTINEL LYMPH NODE BIOPSY;  Surgeon: Stark Klein, MD;  Location: [facility] [facility];  Service: General;  Laterality: Left;  [place], RNFA  . COLONOSCOPY    . FRACTURE SURGERY Right    ankle  . GANGLION CYST EXCISION    . LYMPH NODE BIOPSY N/A 09/26/2015   Procedure: SENTINEL LYMPH NODE BIOPSY;  Surgeon: Everitt Amber, MD;  [facility];  Service: Gynecology;  Laterality: N/A;  . ROBOTIC ASSISTED TOTAL HYSTERECTOMY WITH BILATERAL SALPINGO OOPHERECTOMY Bilateral 09/26/2015   Procedure: ROBOTIC  ASSISTED TOTAL HYSTERECTOMY WITH BILATERAL SALPINGO OOPHORECTOMY;  Surgeon: Everitt Amber, MD;  [facility];  Service: Gynecology;  Laterality: Bilateral;  . TOTAL SHOULDER ARTHROPLASTY Left 09/10/2018   Procedure: TOTAL SHOULDER ARTHROPLASTY;  Surgeon: Justice Britain, MD;  Location: [place];  Service: Orthopedics;  Laterality: Left;  128mn  . WISDOM TOOTH EXTRACTION      There were no vitals filed for this visit.   Subjective Assessment - 12/07/20 1005    Subjective Has been on Augmentin x 4 days and it seems to be helping.  Saw Dr. BBarry Dienes2 days ago who said to just watch it and come back in 2 weeks.    Pertinent History Pt. presents to PT s/p Left Breast Lumpectomy with SLNB on 10/19/2020.  She was diagnosed during a routine mammogram.  She has a history of Edometrial CA. with hysterectomy in 2016 and radiation in 2017. She had 3 LN removed.  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  She has not had pathology report yet.  She has a left TSA    Currently in Pain? Yes    Pain Score 4     Pain Location Axilla    Pain Orientation Left    Pain Descriptors / Indicators Tender;Sore    Pain Onset More than  a month ago    Pain Frequency Constant              [facility] PT Assessment - 12/07/20 0001      Observation/Other Assessments   Observations improved redness today.  Not bright red anymore but still a dark red/purple like color.  See picture.                         [facility] Adult PT Treatment/Exercise - 12/07/20 0001      Manual Therapy   Myofascial Release MFR to areas of cording at axilla, upper arm, antecubital fossa, and upper lower arm    Manual Lymphatic Drainage (MLD) post cording work to the UE only distal to proximal    Passive ROM PROM to the left shoulder into flexion                    PT Short Term Goals - 10/30/20 1632      PT SHORT TERM GOAL #1   Title Pt will be independent with HEP for left shoulder ROM/ strength     Time 4    Period Weeks    Status New             PT Long Term Goals - 11/23/20 1108      PT LONG TERM GOAL #1   Title Pt will be independent in self Manual Lymph drainage to decrease swelling    Baseline pt feels more and more comfortable each session with self MLD - 90%    Status Partially Met      PT LONG TERM GOAL #2   Title Pt will be fit for compression bra/sleeve prn    Baseline Pt has not gotten a compression bra yet and has prescription - pt did not like the sleeve and will wait    Status Partially Met      PT LONG TERM GOAL #3   Title Left shoulder AROM returned to normal pre surgery ROM per pt. (has left TSA)    Baseline pt feels like motion is back    Status Achieved      PT LONG TERM GOAL #4   Title Left chest/trunk swelling will be reduced greater than 50% per pt and will have no discomfort    Baseline Pt feeling like the swelling and discomfort is improved by 50%    Status Achieved                 Plan - 12/07/20 1242    Clinical Impression Statement Pt is doing better today with current infection but still red and avoided MLD and work to the breast areas.  Focused on cording release which travels from the axilla down to the medial bicep and across the elbow.    PT Frequency 2x / week    PT Duration 6 weeks    PT Treatment/Interventions ADLs/Self Care Home Management;Therapeutic activities;Therapeutic exercise;Neuromuscular re-education;Manual techniques;Patient/family education;Orthotic Fit/Training;Manual lymph drainage;Passive range of motion;Scar mobilization    PT Next Visit Plan assess pt area of seroma and see what pt says about MD appt. Will need to hold MLD until redness resolves, MFR to cording, PROM    Consulted and Agree with Plan of Care Patient           Patient will benefit from skilled therapeutic intervention in order to improve the following deficits and impairments:  Decreased mobility,Decreased strength,Increased edema,Impaired  sensation,Postural dysfunction,Decreased scar mobility,Pain,Decreased skin integrity,Decreased  knowledge of precautions,Decreased activity tolerance,Impaired UE functional use,Decreased range of motion  Visit Diagnosis: Localized edema  Abnormal posture  Aftercare following surgery for neoplasm  Left shoulder pain, unspecified chronicity  Malignant neoplasm of upper-outer quadrant of left breast in female, estrogen receptor positive ([place])     Problem List Patient Active Problem List   Diagnosis Date Noted  . Family history of prostate cancer   . Family history of breast cancer   . Family history of kidney cancer   . Malignant neoplasm of upper-outer quadrant of left breast in female, estrogen receptor positive ([place]) 09/21/2020  . Prediabetes 07/02/2019  . 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 Endometrial cancer ([place]) 09/26/2015  . Essential hypertension 05/23/2015  . Thoracic facet joint syndrome 04/27/2014  . Back pain 03/24/2014  . Muscle spasm 03/24/2014  . Scoliosis (and kyphoscoliosis), idiopathic 12/22/2012  . Osteopenia 12/22/2012  . Other and unspecified hyperlipidemia 12/22/2012    Stark Bray 12/07/2020, 12:54 PM  [place], [place], [address] Phone: [phone]   Fax:  [phone]  Name: CHARMA MOCARSKI MRN: [id number] Date of Birth: 22-Oct-1945

## 2020-12-08 ENCOUNTER — Ambulatory Visit: Payer: Medicare Other

## 2020-12-08 DIAGNOSIS — Z170 Estrogen receptor positive status [ER+]: Secondary | ICD-10-CM | POA: Diagnosis not present

## 2020-12-08 DIAGNOSIS — M25512 Pain in left shoulder: Secondary | ICD-10-CM | POA: Insufficient documentation

## 2020-12-08 DIAGNOSIS — R293 Abnormal posture: Secondary | ICD-10-CM | POA: Insufficient documentation

## 2020-12-08 DIAGNOSIS — Z483 Aftercare following surgery for neoplasm: Secondary | ICD-10-CM | POA: Diagnosis not present

## 2020-12-08 DIAGNOSIS — R600 Localized edema: Secondary | ICD-10-CM | POA: Diagnosis not present

## 2020-12-08 DIAGNOSIS — C50412 Malignant neoplasm of upper-outer quadrant of left female breast: Secondary | ICD-10-CM | POA: Diagnosis not present

## 2020-12-10 NOTE — Progress Notes (Signed)
Dawn Powers  [phone] [phone]     ID: Dawn Powers DOB: 1946/01/07  MR#: [id number]  BZJ#:[id number]  Patient Care Team: Dawn Mclean, MD as PCP - General (Family Medicine) Dawn Powers, Dawn Dad, MD as Consulting Physician (Oncology) Dawn Klein, MD as Consulting Physician (General Surgery) Dawn Amber, MD as Consulting Physician (Gynecologic Oncology) Dawn Pray, MD as Consulting Physician (Radiation Oncology) Dawn Fowler, MD as Consulting Physician (Obstetrics and Gynecology) Dawn Germany, RN as Nurse Navigator Dawn Powers, Dawn Blanch, RN as Nurse Navigator Dawn Cruel, MD OTHER MD:  CHIEF COMPLAINT: Estrogen receptor positive breast cancer  CURRENT TREATMENT: Aanstrozole; adjuvant radiation   INTERVAL HISTORY: Dawn Powers returns today for follow-up of her estrogen-receptor positive breast cancer accompanied by her daughter Dawn Powers   Since her last visit, she underwent genetic counseling on 12/04/2020. Results from testing are still pending.  She continues on anastrozole.  She does have nighttime hot flashes which wake her up about twice a night.  She is aware of the possibility of using gabapentin for this but for now wishes to demur   REVIEW OF SYSTEMS: Dawn Powers completed her Augmentin last night.  She has had a significant response as described below.  She tolerated the antibiotic without any complications.   COVID 19 VACCINATION STATUS: Status post Pfizer x2 with booster August 2021   HISTORY OF CURRENT ILLNESS:   From the original intake note:  Dawn Powers had routine screening mammography on 08/18/2020 showing a possible abnormality in the left breast. She underwent left diagnostic mammography with tomography and left breast ultrasonography at [facility] on 09/06/2020 showing: breast density category B; palpable 1.1 cm mass at 3 o'clock; normal-appearing left axillary lymph nodes.  Accordingly on 09/19/2020 she  proceeded to biopsy of the left breast area in question. The pathology from this procedure ([id number]) showed: invasive mammary carcinoma, e-cadherin positive, grade 3. Prognostic indicators significant for: estrogen receptor, 95% positive and progesterone receptor, 10% % positive, both with strong staining intensity. Proliferation marker Ki67 at 10%. HER2 equivocal by immunohistochemistry with fluorescent in situ hybridization pending  The patient's subsequent history is as detailed below.   PAST MEDICAL HISTORY: Past Medical History:  Diagnosis Date  . Anxiety   . Arthritis   . Breast cancer ([place]) 09/19/2020   left breast IMC  . Cancer [facility])    Endometrial   . Family history of breast cancer   . Family history of kidney cancer   . Family history of prostate cancer   . Heart murmur   . Hyperlipidemia   . Hypertension   . Neuromuscular disorder ([place])    DDD  . Pain    lower back -hx "DDD"  . Radiation 11/15/15, 11/23/15, 11/30/15, 12/07/15, 12/14/15   HDR brachytherapy    PAST SURGICAL HISTORY: Past Surgical History:  Procedure Laterality Date  . ABDOMINAL HYSTERECTOMY    . BREAST LUMPECTOMY WITH RADIOACTIVE SEED AND SENTINEL LYMPH NODE BIOPSY Left 10/19/2020   Procedure: LEFT BREAST LUMPECTOMY WITH RADIOACTIVE SEED AND SENTINEL LYMPH NODE BIOPSY;  Surgeon: Dawn Klein, MD;  Location: [facility];  Service: General;  Laterality: Left;  [place], RNFA  . COLONOSCOPY    . FRACTURE SURGERY Right    ankle  . GANGLION CYST EXCISION    . LYMPH NODE BIOPSY N/A 09/26/2015   Procedure: SENTINEL LYMPH NODE BIOPSY;  Surgeon: Dawn Amber, MD;  Location: [facility];  Service: Gynecology;  Laterality: N/A;  . ROBOTIC ASSISTED TOTAL  HYSTERECTOMY WITH BILATERAL SALPINGO OOPHERECTOMY Bilateral 09/26/2015   Procedure: ROBOTIC ASSISTED TOTAL HYSTERECTOMY WITH BILATERAL SALPINGO OOPHORECTOMY;  Surgeon: Dawn Amber, MD;  Location: [facility];  Service: Gynecology;  Laterality: Bilateral;  .  TOTAL SHOULDER ARTHROPLASTY Left 09/10/2018   Procedure: TOTAL SHOULDER ARTHROPLASTY;  Surgeon: Justice Britain, MD;  Location: [facility];  Service: Orthopedics;  Laterality: Left;  150min  . WISDOM TOOTH EXTRACTION      FAMILY HISTORY: Family History  Problem Relation Age of Onset  . Heart disease Mother   . Alcohol abuse Mother        breast cancer - questionable   . Dementia Mother   . Alcohol abuse Father   . Prostate cancer Father 39       prostate and colon- unsure of primary   . Kidney cancer Brother 31  . Alcohol abuse Maternal Grandfather   . Alcohol abuse Sister   . Basal cell carcinoma Sister   . Breast cancer Cousin        double mastectomy, d. 71  . Prostate cancer Brother 20  . Esophageal cancer Neg Hx   . Stomach cancer Neg Hx   . Rectal cancer Neg Hx   The patient's father died with prostate cancer at the age of 24.  The patient's mother died from heart disease at the age of 33.  She had had a mastectomy for unclear reasons.  The patient has 1 sister in good health and 2 brothers 1 of whom has had prostate cancer   GYNECOLOGIC HISTORY:  No LMP recorded. Patient has had a hysterectomy. Menarche: 75 years old Age at first live birth: 75 years old GX P 3 LMP 51 HRT about 2 years Hysterectomy? Yes, 08/2015 for endometrial cancer BSO? 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  Her husband Dawn Powers is a retired Administrator, teaching in the modern languages in film department.  At home it is just the 2 of them plus their hound dog.  Daughter Dawn Powers "the librarian" works at [employer].  Second child Dawn Powers died at age 3.  Daughter Dawn Powers lives in [place].  The patient has 2 grandchildren and attends [place]    ADVANCED DIRECTIVES: In the absence of any documentation to the contrary, the patient's spouse is their HCPOA.    HEALTH MAINTENANCE: Social History   Tobacco Use  . Smoking status: Former Smoker    Years: 4.00     Types: Cigarettes    Quit date: 09/30/1998    Years since quitting: 22.2  . Smokeless tobacco: Never Used  Vaping Use  . Vaping Use: Never used  Substance Use Topics  . Alcohol use: Yes    Alcohol/week: 3.0 standard drinks    Types: 3 Standard drinks or equivalent per week    Comment: 5 drinks per week  . Drug use: No     Colonoscopy: 08/2015 (Dr. Carlean Purl), repeat not indicated due to age  PAP: Status post hysterectomy  Bone density: 08/2009, -2.0   Allergies  Allergen Reactions  . Morphine And Related Hives    Current Outpatient Medications  Medication Sig Dispense Refill  . amoxicillin-clavulanate (AUGMENTIN) 875-125 MG tablet Take 1 tablet by mouth 2 (two) times daily. 14 tablet 1  . anastrozole (ARIMIDEX) 1 MG tablet Take 1 tablet (1 mg total) by mouth daily. 90 tablet 4  . losartan (COZAAR) 50 MG tablet TAKE 1 TABLET ONCE DAILY. (Patient taking differently: 25 mg.) 90 tablet 1  .  traZODone (DESYREL) 50 MG tablet Take 1.5 tablets (75 mg total) by mouth at bedtime as needed for sleep. (Patient taking differently: Take 25 mg by mouth at bedtime as needed for sleep.) 135 tablet 1   No current facility-administered medications for this visit.    OBJECTIVE: White woman who appears younger than stated age  Vitals:   12/11/20 1514  BP: (!) 146/64  Pulse: 78  Resp: 18  Temp: 97.9 F (36.6 C)  SpO2: 99%     Body mass index is 20.13 kg/m.   Wt Readings from Last 3 Encounters:  12/11/20 122 lb 1.6 oz (55.4 kg)  12/04/20 123 lb 3.2 oz (55.9 kg)  11/21/20 124 lb 9.6 oz (56.5 kg)      ECOG FS:1 - Symptomatic but completely ambulatory  Sclerae unicteric, EOMs intact Wearing a mask No cervical or supraclavicular adenopathy Lungs no rales or rhonchi Heart regular rate and rhythm Abd soft, nontender, positive bowel sounds MSK no focal spinal tenderness, no upper extremity lymphedema Neuro: nonfocal, well oriented, appropriate affect Breasts: The right breast is fine.   The left breast is status post lumpectomy and cellulitis.  It is considerably improved.  There is a little bit of induration under the breast incision which is expected.  There is a very small hematoma now measuring approximately 3 mm in the left axilla.  There is no evidence of residual recurrent disease.   Left breast 12/04/2020     LAB RESULTS:  CMP     Component Value Date/Time   NA 136 12/04/2020 1110   K 4.2 12/04/2020 1110   CL 102 12/04/2020 1110   CO2 26 12/04/2020 1110   GLUCOSE 98 12/04/2020 1110   BUN 14 12/04/2020 1110   CREATININE 0.89 12/04/2020 1110   CREATININE 0.97 (H) 07/31/2020 1045   CALCIUM 9.7 12/04/2020 1110   PROT 7.3 12/04/2020 1110   ALBUMIN 3.9 12/04/2020 1110   AST 18 12/04/2020 1110   ALT 11 12/04/2020 1110   ALKPHOS 60 12/04/2020 1110   BILITOT 0.4 12/04/2020 1110   GFRNONAA >60 12/04/2020 1110   GFRNONAA 70 04/11/2016 1200   GFRAA >60 09/03/2018 0934   GFRAA 80 04/11/2016 1200    Lab Results  Component Value Date   TOTALPROTELP 6.5 07/20/2014   TOTALPROTELP 6.5 07/20/2014   ALBUMINELP 63.3 07/20/2014   A1GS 4.7 07/20/2014   A2GS 11.4 07/20/2014   BETS 5.7 07/20/2014   BETA2SER 4.2 07/20/2014   GAMS 10.7 (L) 07/20/2014   MSPIKE NOT DET 07/20/2014   SPEI SEE NOTE 07/20/2014    Lab Results  Component Value Date   WBC 5.6 12/04/2020   NEUTROABS 3.6 12/04/2020   HGB 12.6 12/04/2020   HCT 37.7 12/04/2020   MCV 96.4 12/04/2020   PLT 299 12/04/2020    No results found for: LABCA2  No components found for: BZGONN117  No results for input(s): INR in the last 168 hours.  No results found for: LABCA2  No results found for: DCZ939  No results found for: KET781  No results found for: CMM592  No results found for: CA2729  No components found for: HGQUANT  No results found for: CEA1 / No results found for: CEA1   No results found for: AFPTUMOR  No results found for: CHROMOGRNA  No results found for: KPAFRELGTCHN,  LAMBDASER, KAPLAMBRATIO (kappa/lambda light chains)  No results found for: HGBA, HGBA2QUANT, HGBFQUANT, HGBSQUAN (Hemoglobinopathy evaluation)   No results found for: LDH  No results found for: IRON,  TIBC, IRONPCTSAT (Iron and TIBC)  No results found for: FERRITIN  Urinalysis    Component Value Date/Time   COLORURINE YELLOW 09/19/2015 1400   APPEARANCEUR CLEAR 09/19/2015 1400   LABSPEC 1.023 09/19/2015 1400   PHURINE 7.0 09/19/2015 1400   GLUCOSEU NEGATIVE 09/19/2015 1400   HGBUR NEGATIVE 09/19/2015 1400   BILIRUBINUR negative 11/06/2016 1411   BILIRUBINUR neg 05/23/2015 1003   KETONESUR negative 11/06/2016 1411   KETONESUR NEGATIVE 09/19/2015 1400   PROTEINUR negative 11/06/2016 1411   PROTEINUR NEGATIVE 09/19/2015 1400   UROBILINOGEN negative 11/06/2016 1411   NITRITE Negative 11/06/2016 1411   NITRITE NEGATIVE 09/19/2015 1400   LEUKOCYTESUR Negative 11/06/2016 1411    STUDIES: US BREAST LTD UNI LEFT INC AXILLA  Result Date: 11/30/2020 CLINICAL DATA:  Patient with history of left lumpectomy and left axillary lymph node removal. Patient has seromas at both sites. Patient now presents with cellulitis involving the outer left breast. Evaluate for infection of the associated collections. EXAM: ULTRASOUND OF THE LEFT BREAST COMPARISON:  Previous exam(s). FINDINGS: Targeted ultrasound is performed, showing a complex collection within the left breast 3-4 o'clock position measuring up to 8 cm. Additionally within the left axilla there is a 4.1 x 1.9 x 6.3 cm complex collection. IMPRESSION: Collection within the left breast 3-4 o'clock position and left axilla. These likely represent seromas. Superimposed infection not excluded. RECOMMENDATION: Ultrasound-guided aspiration of both collections within the left breast 3 o'clock position and left axilla. Continue antibiotic therapy. Clinical follow-up until resolution. I have discussed the findings and recommendations with the patient. If  applicable, a reminder letter will be sent to the patient regarding the next appointment. BI-RADS CATEGORY  2: Benign. Electronically Signed   By: Lovey Newcomer M.D.   On: 11/30/2020 16:05   US BREAST ASPIRATION LEFT  Addendum Date: 12/08/2020   ADDENDUM REPORT: 12/06/2020 09:31 ADDENDUM: LEFT breast aspiration yielded Gram Stain-NO WBC SEEN, NO ORGANISMS SEEN. Culture-No growth aerobically or anaerobically. The patient was notified of results and her questions were answered. The patient reported the affected area has less redness and feels smaller. She was instructed to call with any additional questions or concerns, or if symptoms worsen. My direct phone number was provided to the patient. The patient was instructed to continue with clinical follow-up as needed, and to return for annual diagnostic mammography, due in November 2022. Pathology results reported by Terie Purser, RN on 12/06/2020. Electronically Signed   By: Lovey Newcomer M.D.   On: 12/06/2020 09:31   Result Date: 12/08/2020 CLINICAL DATA:  Patient for ultrasound-guided aspiration of left breast collection and left axillary complex collection. EXAM: ULTRASOUND GUIDED LEFT BREAST CYST ASPIRATION COMPARISON:  Previous exams. PROCEDURE: The patient and I discussed the procedure of ultrasound-guided aspiration including benefits and alternatives. We discussed the high likelihood of a successful procedure. We discussed the risks of the procedure including infection, bleeding, tissue injury, and inadequate sampling. Informed written consent was given. The usual time out protocol was performed immediately prior to the procedure. Site 1: Left breast 3 o'clock position Using sterile technique, 1% lidocaine, under direct ultrasound visualization, needle aspiration of collection left breast 3 o'clock position was performed. Approximately 35 cc of thin sanguinous fluid was aspirated. Fluid was sent to the lab for analysis. Site 2: Left axilla. Using sterile  technique, 1% lidocaine, under direct ultrasound visualization, needle aspiration of complex left axillary collection was performed. Approximately 10 cc of thin sanguinous fluid was aspirated. IMPRESSION: Ultrasound-guided aspiration of left breast (3 o'clock)  and left axillary collections. No apparent complications. RECOMMENDATIONS: Continued clinical follow-up for suspected cellulitis and seromas within the left breast. Patient can return for additional drainage as clinically warranted. Electronically Signed: By: Annia Belt M.D. On: 11/30/2020 15:51   US BREAST ASPIRATION LEFT  Addendum Date: 12/08/2020   ADDENDUM REPORT: 12/06/2020 09:31 ADDENDUM: LEFT breast aspiration yielded Gram Stain-NO WBC SEEN, NO ORGANISMS SEEN. Culture-No growth aerobically or anaerobically. The patient was notified of results and her questions were answered. The patient reported the affected area has less redness and feels smaller. She was instructed to call with any additional questions or concerns, or if symptoms worsen. My direct phone number was provided to the patient. The patient was instructed to continue with clinical follow-up as needed, and to return for annual diagnostic mammography, due in November 2022. Pathology results reported by Rene Kocher, RN on 12/06/2020. Electronically Signed   By: Annia Belt M.D.   On: 12/06/2020 09:31   Result Date: 12/08/2020 CLINICAL DATA:  Patient for ultrasound-guided aspiration of left breast collection and left axillary complex collection. EXAM: ULTRASOUND GUIDED LEFT BREAST CYST ASPIRATION COMPARISON:  Previous exams. PROCEDURE: The patient and I discussed the procedure of ultrasound-guided aspiration including benefits and alternatives. We discussed the high likelihood of a successful procedure. We discussed the risks of the procedure including infection, bleeding, tissue injury, and inadequate sampling. Informed written consent was given. The usual time out protocol was performed  immediately prior to the procedure. Site 1: Left breast 3 o'clock position Using sterile technique, 1% lidocaine, under direct ultrasound visualization, needle aspiration of collection left breast 3 o'clock position was performed. Approximately 35 cc of thin sanguinous fluid was aspirated. Fluid was sent to the lab for analysis. Site 2: Left axilla. Using sterile technique, 1% lidocaine, under direct ultrasound visualization, needle aspiration of complex left axillary collection was performed. Approximately 10 cc of thin sanguinous fluid was aspirated. IMPRESSION: Ultrasound-guided aspiration of left breast (3 o'clock) and left axillary collections. No apparent complications. RECOMMENDATIONS: Continued clinical follow-up for suspected cellulitis and seromas within the left breast. Patient can return for additional drainage as clinically warranted. Electronically Signed: By: Annia Belt M.D. On: 11/30/2020 15:51     ELIGIBLE FOR AVAILABLE RESEARCH PROTOCOL: AET  ASSESSMENT: 75 y.o. [place] woman status post right breast upper outer quadrant biopsy 09/19/2020 for a clinical T1c N0, stage IA invasive ductal carcinoma, grade 3, estrogen and progesterone receptor positive, with an MIB-1 of 10% and HER-2 equivocal with FISH pending  (1) genetics testing  (2) s/p left lumpectomy and sentinel lymph node sampling 10/19/2020 for a pT1c pN0, stage IA invasive ductal carcinoma, grade 2, with negative margins   (3) Oncotype score of 15 predicts a risk of recurrence outseide the breast over the next 9 years of 4 % if the patient's oinlysystemic treatment is anti-estrogens; it predicts not benefit from chemotherapy  (4) adjuvant radiation as appropriate  (5) anastrozole started neoadjuvantly 09/21/2020   PLAN: Aleesia's cellulitis is considerably better.  She is seeing Dr. Basilio Cairo in 2 days.  I have a feeling she is going to postpone her radiation at least a week just to make sure everything completely clears up  especially since she only completed her antibiotics last night.  She is tolerating anastrozole well and the plan will be to continue that a total of 5 years.  Her last bone density was in 2010 and at that time she had a T score of -1.6.  I have setting her up  for mammography and repeat bone density at the [facility] the first week in August.  She will return to see me the second week in August to discuss those results.  Between now and then she will start on vitamin D supplementation  Total encounter time 25 minutes.Sarajane Jews C. Shaterria Sager, MD 12/11/2020 3:17 PM Medical Oncology and Hematology [facility] [place], [place] [address] Tel. [phone]    Fax. [phone]   This document serves as a record of services personally performed by Lurline Del, MD. It was created on his behalf by Wilburn Mylar, a trained medical scribe. The creation of this record is based on the scribe's personal observations and the provider's statements to them.   I, Lurline Del MD, have reviewed the above documentation for accuracy and completeness, and I agree with the above.   *Total Encounter Time as defined by the Centers for Medicare and Medicaid Services includes, in addition to the face-to-face time of a patient visit (documented in the note above) non-face-to-face time: obtaining and reviewing outside history, ordering and reviewing medications, tests or procedures, care coordination (communications with other health care professionals or caregivers) and documentation in the medical record.

## 2020-12-11 ENCOUNTER — Ambulatory Visit: Payer: Medicare Other

## 2020-12-11 ENCOUNTER — Inpatient Hospital Stay (HOSPITAL_BASED_OUTPATIENT_CLINIC_OR_DEPARTMENT_OTHER): Payer: Medicare Other | Admitting: Oncology

## 2020-12-11 ENCOUNTER — Other Ambulatory Visit: Payer: Self-pay

## 2020-12-11 VITALS — BP 146/64 | HR 78 | Temp 97.9°F | Resp 18 | Ht 65.3 in | Wt 122.1 lb

## 2020-12-11 DIAGNOSIS — Z87891 Personal history of nicotine dependence: Secondary | ICD-10-CM | POA: Diagnosis not present

## 2020-12-11 DIAGNOSIS — I10 Essential (primary) hypertension: Secondary | ICD-10-CM | POA: Diagnosis not present

## 2020-12-11 DIAGNOSIS — C50412 Malignant neoplasm of upper-outer quadrant of left female breast: Secondary | ICD-10-CM | POA: Diagnosis not present

## 2020-12-11 DIAGNOSIS — Z170 Estrogen receptor positive status [ER+]: Secondary | ICD-10-CM

## 2020-12-11 DIAGNOSIS — M47894 Other spondylosis, thoracic region: Secondary | ICD-10-CM

## 2020-12-11 DIAGNOSIS — Z79899 Other long term (current) drug therapy: Secondary | ICD-10-CM | POA: Diagnosis not present

## 2020-12-11 DIAGNOSIS — Z79811 Long term (current) use of aromatase inhibitors: Secondary | ICD-10-CM | POA: Diagnosis not present

## 2020-12-11 DIAGNOSIS — C541 Malignant neoplasm of endometrium: Secondary | ICD-10-CM | POA: Diagnosis not present

## 2020-12-11 DIAGNOSIS — C50411 Malignant neoplasm of upper-outer quadrant of right female breast: Secondary | ICD-10-CM | POA: Diagnosis not present

## 2020-12-12 ENCOUNTER — Ambulatory Visit: Payer: Medicare Other

## 2020-12-12 ENCOUNTER — Telehealth: Payer: Self-pay | Admitting: Oncology

## 2020-12-12 DIAGNOSIS — R600 Localized edema: Secondary | ICD-10-CM | POA: Diagnosis not present

## 2020-12-12 DIAGNOSIS — R293 Abnormal posture: Secondary | ICD-10-CM | POA: Diagnosis not present

## 2020-12-12 DIAGNOSIS — C50412 Malignant neoplasm of upper-outer quadrant of left female breast: Secondary | ICD-10-CM | POA: Diagnosis not present

## 2020-12-12 DIAGNOSIS — M25512 Pain in left shoulder: Secondary | ICD-10-CM

## 2020-12-12 DIAGNOSIS — Z170 Estrogen receptor positive status [ER+]: Secondary | ICD-10-CM | POA: Diagnosis not present

## 2020-12-12 DIAGNOSIS — Z483 Aftercare following surgery for neoplasm: Secondary | ICD-10-CM

## 2020-12-12 NOTE — Therapy (Signed)
[place], [place], [address] Phone: [phone]   Fax:  [phone]  Physical Therapy Treatment  Patient Details  Name: Dawn Powers MRN: [id number] Date of Birth: 1946/07/22 Referring Provider (PT): Dr. Jana Hakim   Encounter Date: 12/12/2020   PT End of Session - 12/12/20 1203    Visit Number 11    Number of Visits 22    Date for PT Re-Evaluation 01/18/21    Authorization Type MC    PT Start Time 1106    PT Stop Time 1200    PT Time Calculation (min) 54 min    Activity Tolerance Patient tolerated treatment well    Behavior During Therapy [facility] for tasks assessed/performed           Past Medical History:  Diagnosis Date  . Anxiety   . Arthritis   . Breast cancer ([place]) 09/19/2020   left breast IMC  . [facility])    Endometrial   . Family history of breast cancer   . Family history of kidney cancer   . Family history of prostate cancer   . Heart murmur   . Hyperlipidemia   . Hypertension   . Neuromuscular disorder ([place])    DDD  . Pain    lower back -hx "DDD"  . Radiation 11/15/15, 11/23/15, 11/30/15, 12/07/15, 12/14/15   HDR brachytherapy    Past Surgical History:  Procedure Laterality Date  . ABDOMINAL HYSTERECTOMY    . BREAST LUMPECTOMY WITH RADIOACTIVE SEED AND SENTINEL LYMPH NODE BIOPSY Left 10/19/2020   Procedure: LEFT BREAST LUMPECTOMY WITH RADIOACTIVE SEED AND SENTINEL LYMPH NODE BIOPSY;  Surgeon: Stark Klein, MD;  Location: [place];  Service: General;  Laterality: Left;  [place], RNFA  . COLONOSCOPY    . FRACTURE SURGERY Right    ankle  . GANGLION CYST EXCISION    . LYMPH NODE BIOPSY N/A 09/26/2015   Procedure: SENTINEL LYMPH NODE BIOPSY;  Surgeon: Everitt Amber, MD;  [facility];  Service: Gynecology;  Laterality: N/A;  . ROBOTIC ASSISTED TOTAL HYSTERECTOMY WITH BILATERAL SALPINGO OOPHERECTOMY Bilateral 09/26/2015   Procedure: ROBOTIC ASSISTED TOTAL  HYSTERECTOMY WITH BILATERAL SALPINGO OOPHORECTOMY;  Surgeon: Everitt Amber, MD;  [facility];  Service: Gynecology;  Laterality: Bilateral;  . TOTAL SHOULDER ARTHROPLASTY Left 09/10/2018   Procedure: TOTAL SHOULDER ARTHROPLASTY;  Surgeon: Justice Britain, MD;  Location: [place];  Service: Orthopedics;  Laterality: Left;  162mn  . WISDOM TOOTH EXTRACTION      There were no vitals filed for this visit.   Subjective Assessment - 12/12/20 1107    Subjective Augmentin was finished on Sunday night.  Redness is much better. Swelling is much better. Still 1 area of real firmness/nodularity.  Saw Dr. MJana Hakimand he said it looked good, but he wants me to wait on radiation for a week.   Pertinent History Pt. presents to PT s/p Left Breast Lumpectomy with SLNB on 10/19/2020.  She was diagnosed during a routine mammogram.  She has a history of Edometrial CA. with hysterectomy in 2016 and radiation in 2017. She had 3 LN removed.  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  She has not had pathology report yet.  She has a left TSA                             [facility] Adult PT Treatment/Exercise - 12/12/20 0001  Shoulder Exercises: Supine   Horizontal ABduction Strengthening;Both;10 reps    Theraband Level (Shoulder Horizontal ABduction) Level 1 (Yellow)    External Rotation Strengthening;Both;10 reps    Theraband Level (Shoulder External Rotation) Level 1 (Yellow)    Flexion Strengthening;Both;10 reps    Theraband Level (Shoulder Flexion) Level 1 (Yellow)    Other Supine Exercises AROM bilateral UE flex, scaption, horizontal abd x 5      Manual Therapy   Myofascial Release MFR to areas of cording at axilla, upper arm, antecubital fossa, and upper lower arm    Manual Lymphatic Drainage (MLD) short neck, right axillary LN, left inguinal right Upper arm lateral, medial, and lateral again retracing steps and pathways, but  avoiding red areas under arm    Passive ROM  PROM to the left shoulder into flexion, abd, IR, ER, D2 flexion                  PT Education - 12/12/20 1202    Education Details Pt was instructed in supine scapular series for all except Diagonals.  She was given illustrated and written instructions to perform only 3x's per week    Person(s) Educated Patient    Methods Explanation;Demonstration;Handout    Comprehension Returned demonstration            PT Short Term Goals - 10/30/20 1632      PT SHORT TERM GOAL #1   Title Pt will be independent with HEP for left shoulder ROM/ strength    Time 4    Period Weeks    Status New             PT Long Term Goals - 11/23/20 1108      PT LONG TERM GOAL #1   Title Pt will be independent in self Manual Lymph drainage to decrease swelling    Baseline pt feels more and more comfortable each session with self MLD - 90%    Status Partially Met      PT LONG TERM GOAL #2   Title Pt will be fit for compression bra/sleeve prn    Baseline Pt has not gotten a compression bra yet and has prescription - pt did not like the sleeve and will wait    Status Partially Met      PT LONG TERM GOAL #3   Title Left shoulder AROM returned to normal pre surgery ROM per pt. (has left TSA)    Baseline pt feels like motion is back    Status Achieved      PT LONG TERM GOAL #4   Title Left chest/trunk swelling will be reduced greater than 50% per pt and will have no discomfort    Baseline Pt feeling like the swelling and discomfort is improved by 50%    Status Achieved                 Plan - 12/12/20 1206    Clinical Impression Statement Pt continues to improve with decreased redness but still mildly red under arm and with a firm nodular area MD told her was a hematoma.  Started MLD today but did only a short time and avoided all mildly red areas.  Initiated supine AROM and supine scapular series which was well tolerated and gave pictures of supine scapular series but told to avoid  diagonals until instructed in these.  Pts arm felt much better after stretching and exercise program and pt felt she could reach better    Personal  Factors and Comorbidities Comorbidity 3+    Comorbidities left breast CA with SLNB, prior endometrial CA, Left TSA, Back spasms    Stability/Clinical Decision Making Evolving/Moderate complexity    Rehab Potential Excellent    PT Frequency 2x / week    PT Duration 6 weeks    PT Treatment/Interventions ADLs/Self Care Home Management;Therapeutic activities;Therapeutic exercise;Neuromuscular re-education;Manual techniques;Patient/family education;Orthotic Fit/Training;Manual lymph drainage;Passive range of motion;Scar mobilization    PT Next Visit Plan assess pt area of seroma and see what pt says about MD appt. Will need to hold MLD until redness resolves, MFR to cording, PROM, exs    PT Home Exercise Plan 4 post op exs, adding wrist extension to qi qong movements    Consulted and Agree with Plan of Care Patient           Patient will benefit from skilled therapeutic intervention in order to improve the following deficits and impairments:  Decreased mobility,Decreased strength,Increased edema,Impaired sensation,Postural dysfunction,Decreased scar mobility,Pain,Decreased skin integrity,Decreased knowledge of precautions,Decreased activity tolerance,Impaired UE functional use,Decreased range of motion  Visit Diagnosis: Localized edema  Abnormal posture  Aftercare following surgery for neoplasm  Left shoulder pain, unspecified chronicity  Malignant neoplasm of upper-outer quadrant of left breast in female, estrogen receptor positive ([place])     Problem List Patient Active Problem List   Diagnosis Date Noted  . Family history of prostate cancer   . Family history of breast cancer   . Family history of kidney cancer   . Malignant neoplasm of upper-outer quadrant of left breast in female, estrogen receptor positive ([place]) 09/21/2020  .  Prediabetes 07/02/2019  . 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 Endometrial cancer ([place]) 09/26/2015  . Essential hypertension 05/23/2015  . Thoracic facet joint syndrome 04/27/2014  . Back pain 03/24/2014  . Muscle spasm 03/24/2014  . Scoliosis (and kyphoscoliosis), idiopathic 12/22/2012  . Osteopenia 12/22/2012  . Other and unspecified hyperlipidemia 12/22/2012    Claris Pong 12/12/2020, 12:10 PM  [place], [place], [address] Phone: [phone]   Fax:  [phone]  Name: MARNEE SHERRARD MRN: [id number] Date of Birth: Nov 12, 1945 Cheral Almas, PT 12/12/20 12:13 PM

## 2020-12-12 NOTE — Patient Instructions (Signed)

## 2020-12-12 NOTE — Telephone Encounter (Signed)
Scheduled appointments per 3/14 los. Spoke to patient who is aware of appointments date and times.

## 2020-12-13 ENCOUNTER — Ambulatory Visit: Payer: Medicare Other

## 2020-12-13 ENCOUNTER — Ambulatory Visit: Payer: Medicare Other | Admitting: Radiation Oncology

## 2020-12-14 ENCOUNTER — Ambulatory Visit: Payer: Medicare Other

## 2020-12-14 ENCOUNTER — Other Ambulatory Visit: Payer: Self-pay

## 2020-12-14 ENCOUNTER — Encounter: Payer: Self-pay | Admitting: Oncology

## 2020-12-14 DIAGNOSIS — C50412 Malignant neoplasm of upper-outer quadrant of left female breast: Secondary | ICD-10-CM

## 2020-12-14 DIAGNOSIS — R293 Abnormal posture: Secondary | ICD-10-CM

## 2020-12-14 DIAGNOSIS — Z170 Estrogen receptor positive status [ER+]: Secondary | ICD-10-CM | POA: Diagnosis not present

## 2020-12-14 DIAGNOSIS — M25512 Pain in left shoulder: Secondary | ICD-10-CM

## 2020-12-14 DIAGNOSIS — Z483 Aftercare following surgery for neoplasm: Secondary | ICD-10-CM

## 2020-12-14 DIAGNOSIS — R600 Localized edema: Secondary | ICD-10-CM

## 2020-12-14 NOTE — Therapy (Signed)
[place], [place], [address] Phone: [phone]   Fax:  [phone]  Physical Therapy Treatment  Patient Details  Name: Dawn Powers MRN: [id number] Date of Birth: 05/28/1946 Referring Provider (PT): Dr. Jana Hakim   Encounter Date: 12/14/2020   PT End of Session - 12/14/20 1143    Visit Number 12    Number of Visits 22    Date for PT Re-Evaluation 01/18/21    Authorization Type MC    Authorization - Number of Visits 20    PT Start Time 1105    PT Stop Time 1155    PT Time Calculation (min) 50 min    Activity Tolerance Patient tolerated treatment well    Behavior During Therapy [facility] for tasks assessed/performed           Past Medical History:  Diagnosis Date  . Anxiety   . Arthritis   . Breast cancer ([place]) 09/19/2020   left breast IMC  . Cancer [facility])    Endometrial   . Family history of breast cancer   . Family history of kidney cancer   . Family history of prostate cancer   . Heart murmur   . Hyperlipidemia   . Hypertension   . Neuromuscular disorder ([place])    DDD  . Pain    lower back -hx "DDD"  . Radiation 11/15/15, 11/23/15, 11/30/15, 12/07/15, 12/14/15   HDR brachytherapy    Past Surgical History:  Procedure Laterality Date  . ABDOMINAL HYSTERECTOMY    . BREAST LUMPECTOMY WITH RADIOACTIVE SEED AND SENTINEL LYMPH NODE BIOPSY Left 10/19/2020   Procedure: LEFT BREAST LUMPECTOMY WITH RADIOACTIVE SEED AND SENTINEL LYMPH NODE BIOPSY;  Surgeon: Stark Klein, MD;  Location: [place];  Service: General;  Laterality: Left;  [place], RNFA  . COLONOSCOPY    . FRACTURE SURGERY Right    ankle  . GANGLION CYST EXCISION    . LYMPH NODE BIOPSY N/A 09/26/2015   Procedure: SENTINEL LYMPH NODE BIOPSY;  Surgeon: Everitt Amber, MD;  [facility];  Service: Gynecology;  Laterality: N/A;  . ROBOTIC ASSISTED TOTAL HYSTERECTOMY WITH BILATERAL SALPINGO OOPHERECTOMY Bilateral 09/26/2015    Procedure: ROBOTIC ASSISTED TOTAL HYSTERECTOMY WITH BILATERAL SALPINGO OOPHORECTOMY;  Surgeon: Everitt Amber, MD;  [facility];  Service: Gynecology;  Laterality: Bilateral;  . TOTAL SHOULDER ARTHROPLASTY Left 09/10/2018   Procedure: TOTAL SHOULDER ARTHROPLASTY;  Surgeon: Justice Britain, MD;  Location: [place];  Service: Orthopedics;  Laterality: Left;  138mn  . WISDOM TOOTH EXTRACTION      There were no vitals filed for this visit.   Subjective Assessment - 12/14/20 1103    Subjective Redness is still better.  sometimes I wear a regular bra because they all hit where the sore spot is. still have the numbness and stiffness.  I will start radiation next Wednesday.  Shoulder felt great when I left last time but then got a little stiff so I did the exercises again and it improved.    Pertinent History Pt. presents to PT s/p Left Breast Lumpectomy with SLNB on 10/19/2020.  She was diagnosed during a routine mammogram.  She has a history of Edometrial CA. with hysterectomy in 2016 and radiation in 2017. She had 3 LN removed.  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  She has not had pathology report yet.  She has a left TSA    Patient Stated Goals Check breast, axilla for  swelling, ROM    Currently in Pain? Yes    Pain Score 2     Pain Location Axilla    Pain Orientation Left    Pain Descriptors / Indicators Tightness    Pain Onset More than a month ago    Pain Frequency Intermittent    Aggravating Factors  reaching,                             [facility] Adult PT Treatment/Exercise - 12/14/20 0001      Shoulder Exercises: Supine   Horizontal ABduction Strengthening;Both;10 reps    Theraband Level (Shoulder Horizontal ABduction) Level 1 (Yellow)    External Rotation Strengthening;Both;10 reps    Theraband Level (Shoulder External Rotation) Level 1 (Yellow)    Other Supine Exercises AROM bilateral UE flex, scaption, horizontal abd x 5      Manual Therapy    Myofascial Release MFR to areas of cording at axilla, upper arm, antecubital fossa, and upper lower arm    Manual Lymphatic Drainage (MLD) short neck, right axillary LN, left inguinal LN, interaxillary pathway, left axillo-inguinal pathway supine and SL retracing steps to reduce swelling from seroma.    Passive ROM PROM to the left shoulder into flexion                    PT Short Term Goals - 12/14/20 1201      PT SHORT TERM GOAL #1   Title Pt will be independent with HEP for left shoulder ROM/ strength    Time 4    Period Weeks    Status Achieved             PT Long Term Goals - 11/23/20 1108      PT LONG TERM GOAL #1   Title Pt will be independent in self Manual Lymph drainage to decrease swelling    Baseline pt feels more and more comfortable each session with self MLD - 90%    Status Partially Met      PT LONG TERM GOAL #2   Title Pt will be fit for compression bra/sleeve prn    Baseline Pt has not gotten a compression bra yet and has prescription - pt did not like the sleeve and will wait    Status Partially Met      PT LONG TERM GOAL #3   Title Left shoulder AROM returned to normal pre surgery ROM per pt. (has left TSA)    Baseline pt feels like motion is back    Status Achieved      PT LONG TERM GOAL #4   Title Left chest/trunk swelling will be reduced greater than 50% per pt and will have no discomfort    Baseline Pt feeling like the swelling and discomfort is improved by 50%    Status Achieved                 Plan - 12/14/20 1145    Clinical Impression Statement Redness continues to improve and is now mildly pink.  Initiated MLD today to assist with decreasing edema.  Shoulder ROM always improved after treatment.  Cording still noted and feels tight with AROM but improves after manual work.  Pt uses good form with exercises but required occasional VC's for form with ER.  Pt will start Radiation next week.  Pt advised to keep an eye on the area  to be sure there is no  increased redness over the weekend    Personal Factors and Comorbidities Comorbidity 3+    Comorbidities left breast CA with SLNB, prior endometrial CA, Left TSA, Back spasms    Stability/Clinical Decision Making Stable/Uncomplicated    Rehab Potential Excellent    PT Frequency 2x / week    PT Duration 6 weeks    PT Treatment/Interventions ADLs/Self Care Home Management;Therapeutic activities;Therapeutic exercise;Neuromuscular re-education;Manual techniques;Patient/family education;Orthotic Fit/Training;Manual lymph drainage;Passive range of motion;Scar mobilization    PT Next Visit Plan continue to assess seroma region, continue MLD if it still looks good.  MFR to cording areas.  Instruct Self MLD    PT Home Exercise Plan 4 post op exs, adding wrist extension to qi qong movements    Consulted and Agree with Plan of Care Patient           Patient will benefit from skilled therapeutic intervention in order to improve the following deficits and impairments:  Decreased mobility,Decreased strength,Increased edema,Impaired sensation,Postural dysfunction,Decreased scar mobility,Pain,Decreased skin integrity,Decreased knowledge of precautions,Decreased activity tolerance,Impaired UE functional use,Decreased range of motion  Visit Diagnosis: Localized edema  Abnormal posture  Aftercare following surgery for neoplasm  Left shoulder pain, unspecified chronicity  Malignant neoplasm of upper-outer quadrant of left breast in female, estrogen receptor positive ([place])     Problem List Patient Active Problem List   Diagnosis Date Noted  . Family history of prostate cancer   . Family history of breast cancer   . Family history of kidney cancer   . Malignant neoplasm of upper-outer quadrant of left breast in female, estrogen receptor positive ([place]) 09/21/2020  . Prediabetes 07/02/2019  . 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 Endometrial cancer ([place]) 09/26/2015  .  Essential hypertension 05/23/2015  . Thoracic facet joint syndrome 04/27/2014  . Back pain 03/24/2014  . Muscle spasm 03/24/2014  . Scoliosis (and kyphoscoliosis), idiopathic 12/22/2012  . Osteopenia 12/22/2012  . Other and unspecified hyperlipidemia 12/22/2012    Claris Pong 12/14/2020, 12:05 PM  [place], [place], [address] Phone: [phone]   Fax:  [phone]  Name: Dawn Powers MRN: [id number] Date of Birth: Aug 22, 1946  Cheral Almas, PT 12/14/20 12:06 PM

## 2020-12-15 ENCOUNTER — Ambulatory Visit: Payer: Medicare Other

## 2020-12-18 ENCOUNTER — Ambulatory Visit: Payer: Self-pay | Admitting: Licensed Clinical Social Worker

## 2020-12-18 ENCOUNTER — Encounter: Payer: Self-pay | Admitting: Licensed Clinical Social Worker

## 2020-12-18 ENCOUNTER — Telehealth: Payer: Self-pay | Admitting: Licensed Clinical Social Worker

## 2020-12-18 ENCOUNTER — Ambulatory Visit: Payer: Medicare Other

## 2020-12-18 DIAGNOSIS — Z1379 Encounter for other screening for genetic and chromosomal anomalies: Secondary | ICD-10-CM | POA: Insufficient documentation

## 2020-12-18 DIAGNOSIS — Z8051 Family history of malignant neoplasm of kidney: Secondary | ICD-10-CM

## 2020-12-18 DIAGNOSIS — C50412 Malignant neoplasm of upper-outer quadrant of left female breast: Secondary | ICD-10-CM

## 2020-12-18 DIAGNOSIS — C541 Malignant neoplasm of endometrium: Secondary | ICD-10-CM

## 2020-12-18 DIAGNOSIS — Z8042 Family history of malignant neoplasm of prostate: Secondary | ICD-10-CM

## 2020-12-18 DIAGNOSIS — Z170 Estrogen receptor positive status [ER+]: Secondary | ICD-10-CM

## 2020-12-18 DIAGNOSIS — Z803 Family history of malignant neoplasm of breast: Secondary | ICD-10-CM

## 2020-12-18 NOTE — Telephone Encounter (Signed)
Revealed negative genetic testing. This normal result is reassuring and indicates that it is unlikely Dawn Powers's cancer is due to a hereditary cause.  It is unlikely that there is an increased risk of another cancer due to a mutation in one of these genes.  However, genetic testing is not perfect, and cannot definitively rule out a hereditary cause.  It will be important for her to keep in contact with genetics to learn if any additional testing may be needed in the future.

## 2020-12-18 NOTE — Progress Notes (Signed)
HPI:  Ms. Rohlfs was previously seen in the [facility] clinic due to a personal and family history of cancer and concerns regarding a hereditary predisposition to cancer. Please refer to our prior cancer genetics clinic note for more information regarding our discussion, assessment and recommendations, at the time. Ms. Eversley recent genetic test results were disclosed to her, as were recommendations warranted by these results. These results and recommendations are discussed in more detail below.  CANCER HISTORY:  Oncology History  Endometrial cancer ([place])  09/26/2015 Initial Diagnosis   Endometrial cancer ([place])   09/21/2020 Cancer Staging   Staging form: Corpus Uteri - Carcinoma, AJCC 7th Edition - Clinical: FIGO Stage IB (T1b, N0, M0) - Signed by Chauncey Cruel, MD on 09/21/2020   Malignant neoplasm of upper-outer quadrant of left breast in female, estrogen receptor positive ([place])  09/21/2020 Initial Diagnosis   Malignant neoplasm of upper-outer quadrant of left breast in female, estrogen receptor positive ([place])   09/21/2020 Cancer Staging   Staging form: Breast, AJCC 8th Edition - Clinical: Stage IA (cT1c, cN0, cM0, G3, ER+, PR+, HER2: Equivocal) - Signed by Chauncey Cruel, MD on 09/21/2020    Genetic Testing   Negative genetic testing. No pathogenic variants identified on the [facility] Multi-Cancer Panel +RNA. The report date is 12/17/2020.  The Multi-Cancer Panel + RNA offered by [facility] includes sequencing and/or deletion duplication testing of the following 84 genes: AIP, ALK, APC, ATM, AXIN2,BAP1,  BARD1, BLM, BMPR1A, BRCA1, BRCA2, BRIP1, CASR, CDC73, CDH1, CDK4, CDKN1B, CDKN1C, CDKN2A (p14ARF), CDKN2A (p16INK4a), CEBPA, CHEK2, CTNNA1, DICER1, DIS3L2, EGFR (c.2369C>T, p.Thr790Met variant only), EPCAM (Deletion/duplication testing only), FH, FLCN, GATA2, GPC3, GREM1 (Promoter region deletion/duplication testing only), HOXB13 (c.251G>A, p.Gly84Glu), HRAS, KIT,  MAX, MEN1, MET, MITF (c.952G>A, p.Glu318Lys variant only), MLH1, MSH2, MSH3, MSH6, MUTYH, NBN, NF1, NF2, NTHL1, PALB2, PDGFRA, PHOX2B, PMS2, POLD1, POLE, POT1, PRKAR1A, PTCH1, PTEN, RAD50, RAD51C, RAD51D, RB1, RECQL4, RET, RUNX1, SDHAF2, SDHA (sequence changes only), SDHB, SDHC, SDHD, SMAD4, SMARCA4, SMARCB1, SMARCE1, STK11, SUFU, TERC, TERT, TMEM127, TP53, TSC1, TSC2, VHL, WRN and WT1.     FAMILY HISTORY:  We obtained a detailed, 4-generation family history.  Significant diagnoses are listed below: Family History  Problem Relation Age of Onset  . Heart disease Mother   . Alcohol abuse Mother        breast cancer - questionable   . Dementia Mother   . Alcohol abuse Father   . Prostate cancer Father 25       prostate and colon- unsure of primary   . Kidney cancer Brother 59  . Alcohol abuse Maternal Grandfather   . Alcohol abuse Sister   . Basal cell carcinoma Sister   . Breast cancer Cousin        double mastectomy, d. 27  . Prostate cancer Brother 53  . Esophageal cancer Neg Hx   . Stomach cancer Neg Hx   . Rectal cancer Neg Hx     Ms. Dolby had 1 son and 2 daughters. Her son passed from ileal atresia at 45. Patient has 2 brothers, 1 sister. Her sister has had skin cancer on her nose, BCC. Brother has had kidney cancer at 50 and her other brother had prostate at 45.  Ms. Cihlar father had prostate cancer, metastatic and died at 7. He may also have had colon cancer. No other known cancers in paternal relatives.  Ms. Buist mother possibly had breast cancer in her 37s and died in her late 38s. A  maternal cousin had breast cancer and died at 57. No other known cancers on this side of the family.  Ms. Bristow is unaware of previous family history of genetic testing for hereditary cancer risks. Patient's maternal ancestors are of [place] descent, and paternal ancestors are of English/Irish descent. There is no reported Ashkenazi Jewish ancestry. There is no known  consanguinity.     GENETIC TEST RESULTS: Genetic testing reported out on 12/17/2020 through the [facility] Multi-Cancer Panel +RNA cancer panel found no pathogenic mutations.   The Multi-Cancer Panel + RNA offered by [facility] includes sequencing and/or deletion duplication testing of the following 84 genes: AIP, ALK, APC, ATM, AXIN2,BAP1,  BARD1, BLM, BMPR1A, BRCA1, BRCA2, BRIP1, CASR, CDC73, CDH1, CDK4, CDKN1B, CDKN1C, CDKN2A (p14ARF), CDKN2A (p16INK4a), CEBPA, CHEK2, CTNNA1, DICER1, DIS3L2, EGFR (c.2369C>T, p.Thr790Met variant only), EPCAM (Deletion/duplication testing only), FH, FLCN, GATA2, GPC3, GREM1 (Promoter region deletion/duplication testing only), HOXB13 (c.251G>A, p.Gly84Glu), HRAS, KIT, MAX, MEN1, MET, MITF (c.952G>A, p.Glu318Lys variant only), MLH1, MSH2, MSH3, MSH6, MUTYH, NBN, NF1, NF2, NTHL1, PALB2, PDGFRA, PHOX2B, PMS2, POLD1, POLE, POT1, PRKAR1A, PTCH1, PTEN, RAD50, RAD51C, RAD51D, RB1, RECQL4, RET, RUNX1, SDHAF2, SDHA (sequence changes only), SDHB, SDHC, SDHD, SMAD4, SMARCA4, SMARCB1, SMARCE1, STK11, SUFU, TERC, TERT, TMEM127, TP53, TSC1, TSC2, VHL, WRN and WT1.  The test report has been scanned into EPIC and is located under the Molecular Pathology section of the Results Review tab.  A portion of the result report is included below for reference.     We discussed with Ms. Renk that because current genetic testing is not perfect, it is possible there may be a gene mutation in one of these genes that current testing cannot detect, but that chance is small.  We also discussed, that there could be another gene that has not yet been discovered, or that we have not yet tested, that is responsible for the cancer diagnoses in the family. It is also possible there is a hereditary cause for the cancer in the family that Ms. Brugger did not inherit and therefore was not identified in her testing.  Therefore, it is important to remain in touch with cancer genetics in the future so that we can  continue to offer Ms. Quang the most up to date genetic testing.   ADDITIONAL GENETIC TESTING: We discussed with Ms. Renwick that her genetic testing was fairly extensive.  If there are genes identified to increase cancer risk that can be analyzed in the future, we would be happy to discuss and coordinate this testing at that time.    CANCER SCREENING RECOMMENDATIONS: Ms. Tobey test result is considered negative (normal).  This means that we have not identified a hereditary cause for her  personal and family history of cancer at this time. Most cancers happen by chance and this negative test suggests that her cancer may fall into this category.    While reassuring, this does not definitively rule out a hereditary predisposition to cancer. It is still possible that there could be genetic mutations that are undetectable by current technology. There could be genetic mutations in genes that have not been tested or identified to increase cancer risk.  Therefore, it is recommended she continue to follow the cancer management and screening guidelines provided by her oncology and primary healthcare provider.   An individual's cancer risk and medical management are not determined by genetic test results alone. Overall cancer risk assessment incorporates additional factors, including personal medical history, family history, and any available genetic information that may result in  a personalized plan for cancer prevention and surveillance.  RECOMMENDATIONS FOR FAMILY MEMBERS:  Relatives in this family might be at some increased risk of developing cancer, over the general population risk, simply due to the family history of cancer.  We recommended female relatives in this family have a yearly mammogram beginning at age 22, or 32 years younger than the earliest onset of cancer, an annual clinical breast exam, and perform monthly breast self-exams. Female relatives in this family should also have a gynecological  exam as recommended by their primary provider.  All family members should be referred for colonoscopy starting at age 49.    It is also possible there is a hereditary cause for the cancer in Ms. Azeez's family that she did not inherit and therefore was not identified in her.  Based on Ms. Ziolkowski's family history, we recommended maternal and paternal relatives have genetic counseling and testing. Ms. Maiden will let us know if we can be of any assistance in coordinating genetic counseling and/or testing for these family members.  FOLLOW-UP: Lastly, we discussed with Ms. Rackers that cancer genetics is a rapidly advancing field and it is possible that new genetic tests will be appropriate for her and/or her family members in the future. We encouraged her to remain in contact with cancer genetics on an annual basis so we can update her personal and family histories and let her know of advances in cancer genetics that may benefit this family.   Our contact number was provided. Ms. Isley questions were answered to her satisfaction, and she knows she is welcome to call us at anytime with additional questions or concerns.   Faith Rogue, MS, [facility] Genetic Counselor [email or web address] Phone: [phone]

## 2020-12-19 ENCOUNTER — Ambulatory Visit: Payer: Medicare Other

## 2020-12-19 DIAGNOSIS — D1801 Hemangioma of skin and subcutaneous tissue: Secondary | ICD-10-CM | POA: Diagnosis not present

## 2020-12-19 DIAGNOSIS — L814 Other melanin hyperpigmentation: Secondary | ICD-10-CM | POA: Diagnosis not present

## 2020-12-19 DIAGNOSIS — L570 Actinic keratosis: Secondary | ICD-10-CM | POA: Diagnosis not present

## 2020-12-19 DIAGNOSIS — L853 Xerosis cutis: Secondary | ICD-10-CM | POA: Diagnosis not present

## 2020-12-19 DIAGNOSIS — D225 Melanocytic nevi of trunk: Secondary | ICD-10-CM | POA: Diagnosis not present

## 2020-12-19 DIAGNOSIS — L821 Other seborrheic keratosis: Secondary | ICD-10-CM | POA: Diagnosis not present

## 2020-12-20 ENCOUNTER — Ambulatory Visit: Payer: Medicare Other

## 2020-12-20 ENCOUNTER — Other Ambulatory Visit: Payer: Self-pay

## 2020-12-20 ENCOUNTER — Ambulatory Visit
Admission: RE | Admit: 2020-12-20 | Discharge: 2020-12-20 | Disposition: A | Discharge status: Home / Self Care | Payer: Medicare Other | Source: Ambulatory Visit | Attending: Radiation Oncology | Admitting: Radiation Oncology

## 2020-12-20 DIAGNOSIS — Z170 Estrogen receptor positive status [ER+]: Secondary | ICD-10-CM | POA: Diagnosis not present

## 2020-12-20 DIAGNOSIS — R600 Localized edema: Secondary | ICD-10-CM | POA: Diagnosis not present

## 2020-12-20 DIAGNOSIS — Z483 Aftercare following surgery for neoplasm: Secondary | ICD-10-CM | POA: Diagnosis not present

## 2020-12-20 DIAGNOSIS — C50412 Malignant neoplasm of upper-outer quadrant of left female breast: Secondary | ICD-10-CM | POA: Diagnosis not present

## 2020-12-20 DIAGNOSIS — R293 Abnormal posture: Secondary | ICD-10-CM | POA: Diagnosis not present

## 2020-12-20 DIAGNOSIS — M25512 Pain in left shoulder: Secondary | ICD-10-CM | POA: Diagnosis not present

## 2020-12-21 ENCOUNTER — Ambulatory Visit
Admission: RE | Admit: 2020-12-21 | Discharge: 2020-12-21 | Disposition: A | Discharge status: Home / Self Care | Payer: Medicare Other | Source: Ambulatory Visit | Attending: Radiation Oncology | Admitting: Radiation Oncology

## 2020-12-21 DIAGNOSIS — Z483 Aftercare following surgery for neoplasm: Secondary | ICD-10-CM | POA: Diagnosis not present

## 2020-12-21 DIAGNOSIS — R600 Localized edema: Secondary | ICD-10-CM | POA: Diagnosis not present

## 2020-12-21 DIAGNOSIS — R293 Abnormal posture: Secondary | ICD-10-CM | POA: Diagnosis not present

## 2020-12-21 DIAGNOSIS — M25512 Pain in left shoulder: Secondary | ICD-10-CM | POA: Diagnosis not present

## 2020-12-21 DIAGNOSIS — Z170 Estrogen receptor positive status [ER+]: Secondary | ICD-10-CM | POA: Diagnosis not present

## 2020-12-21 DIAGNOSIS — C50412 Malignant neoplasm of upper-outer quadrant of left female breast: Secondary | ICD-10-CM | POA: Diagnosis not present

## 2020-12-22 ENCOUNTER — Other Ambulatory Visit: Payer: Self-pay

## 2020-12-22 ENCOUNTER — Ambulatory Visit: Payer: Medicare Other | Admitting: Rehabilitation

## 2020-12-22 ENCOUNTER — Ambulatory Visit
Admission: RE | Admit: 2020-12-22 | Discharge: 2020-12-22 | Disposition: A | Discharge status: Home / Self Care | Payer: Medicare Other | Source: Ambulatory Visit | Attending: Radiation Oncology | Admitting: Radiation Oncology

## 2020-12-22 DIAGNOSIS — R293 Abnormal posture: Secondary | ICD-10-CM | POA: Diagnosis not present

## 2020-12-22 DIAGNOSIS — C50412 Malignant neoplasm of upper-outer quadrant of left female breast: Secondary | ICD-10-CM | POA: Diagnosis not present

## 2020-12-22 DIAGNOSIS — Z170 Estrogen receptor positive status [ER+]: Secondary | ICD-10-CM | POA: Diagnosis not present

## 2020-12-22 DIAGNOSIS — Z483 Aftercare following surgery for neoplasm: Secondary | ICD-10-CM | POA: Diagnosis not present

## 2020-12-22 DIAGNOSIS — M25512 Pain in left shoulder: Secondary | ICD-10-CM | POA: Diagnosis not present

## 2020-12-22 DIAGNOSIS — R600 Localized edema: Secondary | ICD-10-CM | POA: Diagnosis not present

## 2020-12-25 ENCOUNTER — Ambulatory Visit
Admission: RE | Admit: 2020-12-25 | Discharge: 2020-12-25 | Disposition: A | Discharge status: Home / Self Care | Payer: Medicare Other | Source: Ambulatory Visit | Attending: Radiation Oncology | Admitting: Radiation Oncology

## 2020-12-25 ENCOUNTER — Other Ambulatory Visit: Payer: Self-pay

## 2020-12-25 DIAGNOSIS — R600 Localized edema: Secondary | ICD-10-CM | POA: Diagnosis not present

## 2020-12-25 DIAGNOSIS — Z170 Estrogen receptor positive status [ER+]: Secondary | ICD-10-CM

## 2020-12-25 DIAGNOSIS — C50412 Malignant neoplasm of upper-outer quadrant of left female breast: Secondary | ICD-10-CM | POA: Diagnosis not present

## 2020-12-25 DIAGNOSIS — M25512 Pain in left shoulder: Secondary | ICD-10-CM | POA: Diagnosis not present

## 2020-12-25 DIAGNOSIS — R293 Abnormal posture: Secondary | ICD-10-CM | POA: Diagnosis not present

## 2020-12-25 DIAGNOSIS — Z483 Aftercare following surgery for neoplasm: Secondary | ICD-10-CM | POA: Diagnosis not present

## 2020-12-25 MED ORDER — ALRA NON-METALLIC DEODORANT (RAD-ONC)
1.0000 "application " | Freq: Once | TOPICAL | Status: AC
  Administered 2020-12-25: 1 via TOPICAL

## 2020-12-25 MED ORDER — RADIAPLEXRX EX GEL: Freq: Once | CUTANEOUS | Status: AC

## 2020-12-25 NOTE — Progress Notes (Signed)

## 2020-12-26 ENCOUNTER — Encounter: Payer: Self-pay | Admitting: General Practice

## 2020-12-26 ENCOUNTER — Ambulatory Visit: Payer: Medicare Other | Admitting: Oncology

## 2020-12-26 ENCOUNTER — Ambulatory Visit
Admission: RE | Admit: 2020-12-26 | Discharge: 2020-12-26 | Disposition: A | Discharge status: Home / Self Care | Payer: Medicare Other | Source: Ambulatory Visit | Attending: Radiation Oncology | Admitting: Radiation Oncology

## 2020-12-26 ENCOUNTER — Ambulatory Visit: Payer: Medicare Other

## 2020-12-26 ENCOUNTER — Other Ambulatory Visit: Payer: Self-pay

## 2020-12-26 DIAGNOSIS — R600 Localized edema: Secondary | ICD-10-CM | POA: Diagnosis not present

## 2020-12-26 DIAGNOSIS — C50412 Malignant neoplasm of upper-outer quadrant of left female breast: Secondary | ICD-10-CM | POA: Diagnosis not present

## 2020-12-26 DIAGNOSIS — R293 Abnormal posture: Secondary | ICD-10-CM | POA: Diagnosis not present

## 2020-12-26 DIAGNOSIS — M25512 Pain in left shoulder: Secondary | ICD-10-CM | POA: Diagnosis not present

## 2020-12-26 DIAGNOSIS — Z483 Aftercare following surgery for neoplasm: Secondary | ICD-10-CM

## 2020-12-26 DIAGNOSIS — Z170 Estrogen receptor positive status [ER+]: Secondary | ICD-10-CM | POA: Diagnosis not present

## 2020-12-26 NOTE — Progress Notes (Signed)
[facility] Spiritual Care Note  Referred by Dr Isidore Moos and Willodean Rosenthal for emotional support related to distress at second cancer diagnosis. Connected with Ms Champa by phone to introduce Spiritual Care and build rapport. We plan to meet in my office tomorrow after her radiation treatment (ca 1:30) to speak in more detail.   [place], [place], [facility] Pager [phone] Voicemail [phone]

## 2020-12-26 NOTE — Therapy (Signed)
[facility] [address] [place], [place], [address] Phone: [phone]   Fax:  [phone]  Physical Therapy Treatment  Patient Details  Name: Dawn Powers MRN: [id number] Date of Birth: 05/30/1946 Referring Provider (PT): Dr. Magrinat   Encounter Date: 12/26/2020   PT End of Session - 12/26/20 1651    Visit Number 13    Number of Visits 22    Date for PT Re-Evaluation 01/18/21    PT Start Time 1604    PT Stop Time 1659    PT Time Calculation (min) 55 min    Activity Tolerance Patient tolerated treatment well    Behavior During Therapy WFL for tasks assessed/performed           Past Medical History:  Diagnosis Date  . Anxiety   . Arthritis   . Breast cancer (HCC) 09/19/2020   left breast IMC  . Cancer (HCC)    Endometrial   . Family history of breast cancer   . Family history of kidney cancer   . Family history of prostate cancer   . Heart murmur   . Hyperlipidemia   . Hypertension   . Neuromuscular disorder (H[facility])    DDD  . Pain    lower back -hx "DDD"  . Radiation 11/15/15, 11/23/15, 11/30/15, 12/07/15, 12/14/15   HDR brachytherapy    Past Surgical History:  Procedure Laterality Date  . ABDOMINAL HYSTERECTOMY    . BREAST LUMPECTOMY WITH RADIOACTIVE SEED AND SENTINEL LYMPH NODE BIOPSY Left 10/19/2020   Procedure: LEFT BREAST LUMPECTOMY WITH RADIOACTIVE SEED AND SENTINEL LYMPH NODE BIOPSY;  Surgeon: Byerly, Faera, MD;  Location: [facility];  Service: General;  Laterality: Left;  PEC BLOCK, RNFA  . COLONOSCOPY    . FRACTURE SURGERY Right    ankle  . GANGLION CYST EXCISION    . LYMPH NODE BIOPSY N/A 09/26/2015   Procedure: SENTINEL LYMPH NODE BIOPSY;  Surgeon: Emma Rossi, MD;  Location: [facility];  Service: Gynecology;  Laterality: N/A;  . ROBOTIC ASSISTED TOTAL HYSTERECTOMY WITH BILATERAL SALPINGO OOPHERECTOMY Bilateral 09/26/2015   Procedure: ROBOTIC ASSISTED TOTAL HYSTERECTOMY WITH BILATERAL  SALPINGO OOPHORECTOMY;  Surgeon: Emma Rossi, MD;  Location: [facility];  Service: Gynecology;  Laterality: Bilateral;  . TOTAL SHOULDER ARTHROPLASTY Left 09/10/2018   Procedure: TOTAL SHOULDER ARTHROPLASTY;  Surgeon: Supple, Kevin, MD;  Location: [facility];  Service: Orthopedics;  Laterality: Left;  120min  . WISDOM TOOTH EXTRACTION      There were no vitals filed for this visit.   Subjective Assessment - 12/26/20 1605    Subjective Area of seroma is much better.  I am starting to get red from the radiation and today was the 5th one.  I went home and took a nap.    Pertinent History Pt. presents to PT s/p Left Breast Lumpectomy with SLNB on 10/19/2020.  She was diagnosed during a routine mammogram.  She has a history of Edometrial CA. with hysterectomy in 2016 and radiation in 2017. She had 3 LN removed.  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  She has not had pathology report yet.  She has a left TSA    Patient Stated Goals Check breast, axilla for swelling, ROM    Currently in Pain? Yes    Pain Score 2     Pain Location Axilla    Pain Orientation Left    Pain Descriptors / Indicators Tightness;Tender;Sore    Pain Type Surgical   pain    Pain Onset More than a month ago    Pain Frequency Intermittent                             [facility] Adult PT Treatment/Exercise - 12/26/20 0001      Shoulder Exercises: Supine   Horizontal ABduction Strengthening;Both;10 reps    Theraband Level (Shoulder Horizontal ABduction) Level 1 (Yellow)    External Rotation Strengthening;Both;10 reps    Theraband Level (Shoulder External Rotation) Level 1 (Yellow)    Other Supine Exercises AROM bilateral UE flex, scaption, horizontal abd x 5      Manual Therapy   Myofascial Release MFR to areas of cording at axilla, upper arm, antecubital fossa, and upper lower arm    Manual Lymphatic Drainage (MLD) short neck, right axillary LN, left inguinal LN, interaxillary pathway, left  axillo-inguinal pathway supine and SL retracing steps to reduce swelling from seroma.    Passive ROM PROM to the left shoulder into flexion, abd, ER, D2 flexion                 PT Education - 12/26/20 1702    Education Details Pt was educated in standing scapular retraction and shoulder extension with yellow x 10 reps    Person(s) Educated Patient    Methods Demonstration;Handout    Comprehension Returned demonstration            PT Short Term Goals - 12/14/20 1201      PT SHORT TERM GOAL #1   Title Pt will be independent with HEP for left shoulder ROM/ strength    Time 4    Period Weeks    Status Achieved             PT Long Term Goals - 11/23/20 1108      PT LONG TERM GOAL #1   Title Pt will be independent in self Manual Lymph drainage to decrease swelling    Baseline pt feels more and more comfortable each session with self MLD - 90%    Status Partially Met      PT LONG TERM GOAL #2   Title Pt will be fit for compression bra/sleeve prn    Baseline Pt has not gotten a compression bra yet and has prescription - pt did not like the sleeve and will wait    Status Partially Met      PT LONG TERM GOAL #3   Title Left shoulder AROM returned to normal pre surgery ROM per pt. (has left TSA)    Baseline pt feels like motion is back    Status Achieved      PT LONG TERM GOAL #4   Title Left chest/trunk swelling will be reduced greater than 50% per pt and will have no discomfort    Baseline Pt feeling like the swelling and discomfort is improved by 50%    Status Achieved                 Plan - 12/26/20 1653    Clinical Impression Statement Redness from seroma is much better however she is developing redness from radiation.  Cording is still present in right axillary region but does not seem to interfere with AROM although it does feel tight.  PROM is doing very nicely and she is using good form with exercises. mild swelling still noted at axillary region     Personal Factors and Comorbidities Comorbidity 3+      Comorbidities left breast CA with SLNB, prior endometrial CA, Left TSA, Back spasms    Stability/Clinical Decision Making Stable/Uncomplicated    Rehab Potential Excellent    PT Frequency 2x / week    PT Duration 6 weeks    PT Treatment/Interventions ADLs/Self Care Home Management;Therapeutic activities;Therapeutic exercise;Neuromuscular re-education;Manual techniques;Patient/family education;Orthotic Fit/Training;Manual lymph drainage;Passive range of motion;Scar mobilization    PT Next Visit Plan continue to assess seroma region, continue MLD if it still looks good.  MFR to cording areas.  Instruct Self MLD    PT Home Exercise Plan 4 post op exs, adding wrist extension to qi qong movements, supine scap series except diagonals, standing scap retraction and shoulder ext with yellow TB    Consulted and Agree with Plan of Care Patient           Patient will benefit from skilled therapeutic intervention in order to improve the following deficits and impairments:  Decreased mobility,Decreased strength,Increased edema,Impaired sensation,Postural dysfunction,Decreased scar mobility,Pain,Decreased skin integrity,Decreased knowledge of precautions,Decreased activity tolerance,Impaired UE functional use,Decreased range of motion  Visit Diagnosis: Abnormal posture  Localized edema  Aftercare following surgery for neoplasm  Left shoulder pain, unspecified chronicity  Malignant neoplasm of upper-outer quadrant of left breast in female, estrogen receptor positive ([place])     Problem List Patient Active Problem List   Diagnosis Date Noted  . Genetic testing 12/18/2020  . Family history of prostate cancer   . Family history of breast cancer   . Family history of kidney cancer   . Malignant neoplasm of upper-outer quadrant of left breast in female, estrogen receptor positive ([place]) 09/21/2020  . Prediabetes 07/02/2019  . S/P shoulder  replacement, 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  . Endometrial cancer ([place]) 09/26/2015  . Essential hypertension 05/23/2015  . Thoracic facet joint syndrome 04/27/2014  . Back pain 03/24/2014  . Muscle spasm 03/24/2014  . Scoliosis (and kyphoscoliosis), idiopathic 12/22/2012  . Osteopenia 12/22/2012  . Other and unspecified hyperlipidemia 12/22/2012    Claris Pong 12/26/2020, 5:05 PM  [facility] [place] [place], [place], [address] Phone: [phone]   Fax:  [phone]  Name: BERNARD DONAHOO MRN: [id number] Date of Birth: August 15, 1946 Cheral Almas, PT 12/26/20 5:07 PM

## 2020-12-26 NOTE — Patient Instructions (Signed)
Access Code: [email or web address] URL: [email or web address] Date: 12/26/2020 Prepared by: Cheral Almas  Exercises Scapular Retraction with Resistance - 1 x daily - 3 x weekly - 1 sets - 10 reps Scapular Retraction with Resistance Advanced - 1 x daily - 3 x weekly - 1 sets - 10 reps

## 2020-12-27 ENCOUNTER — Ambulatory Visit: Payer: Medicare Other | Admitting: Family Medicine

## 2020-12-27 ENCOUNTER — Ambulatory Visit
Admission: RE | Admit: 2020-12-27 | Discharge: 2020-12-27 | Disposition: A | Discharge status: Home / Self Care | Payer: Medicare Other | Source: Ambulatory Visit | Attending: Radiation Oncology | Admitting: Radiation Oncology

## 2020-12-27 ENCOUNTER — Encounter: Payer: Self-pay | Admitting: General Practice

## 2020-12-27 DIAGNOSIS — M25512 Pain in left shoulder: Secondary | ICD-10-CM | POA: Diagnosis not present

## 2020-12-27 DIAGNOSIS — R293 Abnormal posture: Secondary | ICD-10-CM | POA: Diagnosis not present

## 2020-12-27 DIAGNOSIS — R600 Localized edema: Secondary | ICD-10-CM | POA: Diagnosis not present

## 2020-12-27 DIAGNOSIS — Z483 Aftercare following surgery for neoplasm: Secondary | ICD-10-CM | POA: Diagnosis not present

## 2020-12-27 DIAGNOSIS — C50412 Malignant neoplasm of upper-outer quadrant of left female breast: Secondary | ICD-10-CM | POA: Diagnosis not present

## 2020-12-27 DIAGNOSIS — Z170 Estrogen receptor positive status [ER+]: Secondary | ICD-10-CM | POA: Diagnosis not present

## 2020-12-27 NOTE — Progress Notes (Signed)
[facility] Spiritual Care Note  With limited time due to a machine problem, Dawn Powers and I met briefly in my office for introductions and rapport-building. She used the opportunity well to share and to begin to process reasons for her distress. She plans to phone to schedule another appointment so that we can explore distress and coping tools in more detail.   [place], [place], [facility] Pager [phone] Voicemail [phone]

## 2020-12-28 ENCOUNTER — Other Ambulatory Visit: Payer: Self-pay

## 2020-12-28 ENCOUNTER — Encounter: Payer: Self-pay | Admitting: *Deleted

## 2020-12-28 ENCOUNTER — Ambulatory Visit
Admission: RE | Admit: 2020-12-28 | Discharge: 2020-12-28 | Disposition: A | Discharge status: Home / Self Care | Payer: Medicare Other | Source: Ambulatory Visit | Attending: Radiation Oncology | Admitting: Radiation Oncology

## 2020-12-28 DIAGNOSIS — Z483 Aftercare following surgery for neoplasm: Secondary | ICD-10-CM | POA: Diagnosis not present

## 2020-12-28 DIAGNOSIS — R600 Localized edema: Secondary | ICD-10-CM | POA: Diagnosis not present

## 2020-12-28 DIAGNOSIS — C50412 Malignant neoplasm of upper-outer quadrant of left female breast: Secondary | ICD-10-CM | POA: Diagnosis not present

## 2020-12-28 DIAGNOSIS — M25512 Pain in left shoulder: Secondary | ICD-10-CM | POA: Diagnosis not present

## 2020-12-28 DIAGNOSIS — R293 Abnormal posture: Secondary | ICD-10-CM | POA: Diagnosis not present

## 2020-12-28 DIAGNOSIS — Z170 Estrogen receptor positive status [ER+]: Secondary | ICD-10-CM | POA: Diagnosis not present

## 2020-12-29 ENCOUNTER — Ambulatory Visit
Admission: RE | Admit: 2020-12-29 | Discharge: 2020-12-29 | Disposition: A | Discharge status: Home / Self Care | Payer: Medicare Other | Source: Ambulatory Visit | Attending: Radiation Oncology | Admitting: Radiation Oncology

## 2020-12-29 ENCOUNTER — Ambulatory Visit: Payer: Medicare Other

## 2020-12-29 ENCOUNTER — Telehealth: Payer: Self-pay

## 2020-12-29 DIAGNOSIS — Z483 Aftercare following surgery for neoplasm: Secondary | ICD-10-CM | POA: Insufficient documentation

## 2020-12-29 DIAGNOSIS — R293 Abnormal posture: Secondary | ICD-10-CM | POA: Insufficient documentation

## 2020-12-29 DIAGNOSIS — C50412 Malignant neoplasm of upper-outer quadrant of left female breast: Secondary | ICD-10-CM

## 2020-12-29 DIAGNOSIS — Z170 Estrogen receptor positive status [ER+]: Secondary | ICD-10-CM

## 2020-12-29 DIAGNOSIS — R600 Localized edema: Secondary | ICD-10-CM | POA: Insufficient documentation

## 2020-12-29 DIAGNOSIS — M25512 Pain in left shoulder: Secondary | ICD-10-CM | POA: Insufficient documentation

## 2020-12-29 NOTE — Therapy (Signed)
[place], [place], [address] Phone: [phone]   Fax:  [phone]  Physical Therapy Treatment  Patient Details  Name: Dawn Powers MRN: [id number] Date of Birth: 03/03/1946 Referring Provider (PT): Dr. Jana Hakim   Encounter Date: 12/29/2020   PT End of Session - 12/29/20 0938    Visit Number 14    Number of Visits 22    Date for PT Re-Evaluation 01/18/21    Authorization Type MC    PT Start Time 0901    PT Stop Time 0950    PT Time Calculation (min) 49 min    Activity Tolerance Patient tolerated treatment well    Behavior During Therapy [facility] for tasks assessed/performed           Past Medical History:  Diagnosis Date  . Anxiety   . Arthritis   . Breast cancer ([place]) 09/19/2020   left breast IMC  . Cancer [facility])    Endometrial   . Family history of breast cancer   . Family history of kidney cancer   . Family history of prostate cancer   . Heart murmur   . Hyperlipidemia   . Hypertension   . Neuromuscular disorder ([place])    DDD  . Pain    lower back -hx "DDD"  . Radiation 11/15/15, 11/23/15, 11/30/15, 12/07/15, 12/14/15   HDR brachytherapy    Past Surgical History:  Procedure Laterality Date  . ABDOMINAL HYSTERECTOMY    . BREAST LUMPECTOMY WITH RADIOACTIVE SEED AND SENTINEL LYMPH NODE BIOPSY Left 10/19/2020   Procedure: LEFT BREAST LUMPECTOMY WITH RADIOACTIVE SEED AND SENTINEL LYMPH NODE BIOPSY;  Surgeon: Stark Klein, MD;  Location: [facility];  Service: General;  Laterality: Left;  [place], RNFA  . COLONOSCOPY    . FRACTURE SURGERY Right    ankle  . GANGLION CYST EXCISION    . LYMPH NODE BIOPSY N/A 09/26/2015   Procedure: SENTINEL LYMPH NODE BIOPSY;  Surgeon: Everitt Amber, MD;  [facility];  Service: Gynecology;  Laterality: N/A;  . ROBOTIC ASSISTED TOTAL HYSTERECTOMY WITH BILATERAL SALPINGO OOPHERECTOMY Bilateral 09/26/2015   Procedure: ROBOTIC ASSISTED TOTAL  HYSTERECTOMY WITH BILATERAL SALPINGO OOPHORECTOMY;  Surgeon: Everitt Amber, MD;  [facility];  Service: Gynecology;  Laterality: Bilateral;  . TOTAL SHOULDER ARTHROPLASTY Left 09/10/2018   Procedure: TOTAL SHOULDER ARTHROPLASTY;  Surgeon: Justice Britain, MD;  Location: [facility];  Service: Orthopedics;  Laterality: Left;  178min  . WISDOM TOOTH EXTRACTION      There were no vitals filed for this visit.   Subjective Assessment - 12/29/20 0906    Subjective I have radiation today at 1:00.  Not feeling really tired today.  I feel like my breast may be alittle bigger yesterday and today.  Maybe from the radiation.    Pertinent History Pt. presents to PT s/p Left Breast Lumpectomy with SLNB on 10/19/2020.  She was diagnosed during a routine mammogram.  She has a history of Edometrial CA. with hysterectomy in 2016 and radiation in 2017. She had 3 LN removed.  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  She has not had pathology report yet.  She has a left TSA    Patient Stated Goals Check breast, axilla for swelling, ROM    Currently in Pain? No/denies    Pain Score 0-No pain              [facility] PT Assessment - 12/29/20 0001  AROM   Left Shoulder Extension 50 Degrees    Left Shoulder Flexion 160 Degrees    Left Shoulder ABduction 160 Degrees                         [facility] Adult PT Treatment/Exercise - 12/29/20 0001      Manual Therapy   Edema Management scar mobs to area of firmness in axilla.   chhip pack made for lateral breast secondary to losing her other one.   Myofascial Release MFR to areas of cording at axilla, upper arm,    Manual Lymphatic Drainage (MLD) short neck, right and left axillary LN, left inguinal LN, interaxillary pathway, left axillo-inguinal pathway supine and left breat retracing steps to reduce swelling from seroma/radiation.Pt instructed in and practiced same to left breast    Passive ROM PROM to the left shoulder into flexion ,  abd, ER, D2 flex                   PT Short Term Goals - 12/14/20 1201      PT SHORT TERM GOAL #1   Title Pt will be independent with HEP for left shoulder ROM/ strength    Time 4    Period Weeks    Status Achieved             PT Long Term Goals - 12/29/20 0947      PT LONG TERM GOAL #1   Title Pt will be independent in self Manual Lymph drainage to decrease swelling    Time 4    Period Weeks    Status Achieved      PT LONG TERM GOAL #2   Baseline Pt has not gotten a compression bra yet and has prescription - pt did not like the sleeve and will wait    Time 4    Period Weeks    Status On-going      PT LONG TERM GOAL #3   Title Left shoulder AROM returned to normal pre surgery ROM per pt. (has left TSA)    Status Achieved      PT LONG TERM GOAL #4   Baseline slightly more swollen secondary to radiation    Period Weeks    Status On-going    Target Date 01/18/21                 Plan - 12/29/20 0954    Clinical Impression Statement Pts goals were reassessed and pt is making good progress.  She does still have cording but does not experience pain or loss of motion.  She is slightly swollen in the left breast from radiation and still has hematoma at seroma drainage incision.  Ship pack was made for this area and lateral breast and pt feels it is very comfortable in her bra.  We reviewed self MLD to the left breast today and she did very well requiring only occasional VC's.  May be able to decrease to 1x per week    Personal Factors and Comorbidities Comorbidity 3+    Comorbidities left breast CA with SLNB, prior endometrial CA, Left TSA, Back spasms    Stability/Clinical Decision Making Stable/Uncomplicated    Rehab Potential Excellent    PT Frequency 2x / week    PT Duration 6 weeks    PT Treatment/Interventions ADLs/Self Care Home Management;Therapeutic activities;Therapeutic exercise;Neuromuscular re-education;Manual techniques;Patient/family  education;Orthotic Fit/Training;Manual lymph drainage;Passive range of motion;Scar mobilization    PT Next Visit  Plan continue to assess seroma region, continue MLD if it still looks good.  MFR to cording areas. assess chip pack, review standing theraband retraction/extension.  May be able to decrease to 1x/week    PT Home Exercise Plan 4 post op exs, adding wrist extension to qi qong movements, supine scap series except diagonals, standing scap retraction and shoulder ext with yellow TB    Recommended Other Services sleeve (does not think she wants at this time. (has bra)    Consulted and Agree with Plan of Care Patient           Patient will benefit from skilled therapeutic intervention in order to improve the following deficits and impairments:  Decreased mobility,Decreased strength,Increased edema,Impaired sensation,Postural dysfunction,Decreased scar mobility,Pain,Decreased skin integrity,Decreased knowledge of precautions,Decreased activity tolerance,Impaired UE functional use,Decreased range of motion  Visit Diagnosis: Abnormal posture  Localized edema  Aftercare following surgery for neoplasm  Left shoulder pain, unspecified chronicity  Malignant neoplasm of upper-outer quadrant of left breast in female, estrogen receptor positive ([place])     Problem List Patient Active Problem List   Diagnosis Date Noted  . Genetic testing 12/18/2020  . Family history of prostate cancer   . Family history of breast cancer   . Family history of kidney cancer   . Malignant neoplasm of upper-outer quadrant of left breast in female, estrogen receptor positive ([place]) 09/21/2020  . Prediabetes 07/02/2019  . 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 Endometrial cancer ([place]) 09/26/2015  . Essential hypertension 05/23/2015  . Thoracic facet joint syndrome 04/27/2014  . Back pain 03/24/2014  . Muscle spasm 03/24/2014  . Scoliosis (and kyphoscoliosis), idiopathic 12/22/2012  . Osteopenia  12/22/2012  . Other and unspecified hyperlipidemia 12/22/2012    Claris Pong 12/29/2020, 10:00 AM  [place], [place], [address] Phone: [phone]   Fax:  [phone]  Name: Dawn Powers MRN: [id number] Date of Birth: 01-04-1946 Cheral Almas, PT 12/29/20 10:02 AM

## 2020-12-29 NOTE — Telephone Encounter (Signed)
Pt callled to ask if Dr Jana Hakim is recommending 2nd COVID booster. Pt was advised that at this time he is not recommending as there is not enough data; however the pt is welcome to get it if they please. Pt verbalized understanding.

## 2021-01-01 ENCOUNTER — Ambulatory Visit
Admission: RE | Admit: 2021-01-01 | Discharge: 2021-01-01 | Disposition: A | Discharge status: Home / Self Care | Payer: Medicare Other | Source: Ambulatory Visit | Attending: Radiation Oncology | Admitting: Radiation Oncology

## 2021-01-01 ENCOUNTER — Encounter: Payer: Self-pay | Admitting: *Deleted

## 2021-01-01 ENCOUNTER — Other Ambulatory Visit: Payer: Self-pay

## 2021-01-01 DIAGNOSIS — Z170 Estrogen receptor positive status [ER+]: Secondary | ICD-10-CM

## 2021-01-01 DIAGNOSIS — R293 Abnormal posture: Secondary | ICD-10-CM | POA: Diagnosis not present

## 2021-01-01 DIAGNOSIS — C50412 Malignant neoplasm of upper-outer quadrant of left female breast: Secondary | ICD-10-CM | POA: Diagnosis not present

## 2021-01-01 DIAGNOSIS — M25512 Pain in left shoulder: Secondary | ICD-10-CM | POA: Diagnosis not present

## 2021-01-01 DIAGNOSIS — R600 Localized edema: Secondary | ICD-10-CM | POA: Diagnosis not present

## 2021-01-01 MED ORDER — RADIAPLEXRX EX GEL: Freq: Once | CUTANEOUS | Status: AC

## 2021-01-02 ENCOUNTER — Ambulatory Visit
Admission: RE | Admit: 2021-01-02 | Discharge: 2021-01-02 | Disposition: A | Discharge status: Home / Self Care | Payer: Medicare Other | Source: Ambulatory Visit | Attending: Radiation Oncology | Admitting: Radiation Oncology

## 2021-01-02 DIAGNOSIS — Z170 Estrogen receptor positive status [ER+]: Secondary | ICD-10-CM | POA: Diagnosis not present

## 2021-01-02 DIAGNOSIS — R600 Localized edema: Secondary | ICD-10-CM | POA: Diagnosis not present

## 2021-01-02 DIAGNOSIS — R293 Abnormal posture: Secondary | ICD-10-CM | POA: Diagnosis not present

## 2021-01-02 DIAGNOSIS — M25512 Pain in left shoulder: Secondary | ICD-10-CM | POA: Diagnosis not present

## 2021-01-02 DIAGNOSIS — C50412 Malignant neoplasm of upper-outer quadrant of left female breast: Secondary | ICD-10-CM | POA: Diagnosis not present

## 2021-01-03 ENCOUNTER — Other Ambulatory Visit: Payer: Self-pay

## 2021-01-03 ENCOUNTER — Ambulatory Visit: Payer: Medicare Other

## 2021-01-03 ENCOUNTER — Ambulatory Visit
Admission: RE | Admit: 2021-01-03 | Discharge: 2021-01-03 | Disposition: A | Discharge status: Home / Self Care | Payer: Medicare Other | Source: Ambulatory Visit | Attending: Radiation Oncology | Admitting: Radiation Oncology

## 2021-01-03 DIAGNOSIS — C50412 Malignant neoplasm of upper-outer quadrant of left female breast: Secondary | ICD-10-CM | POA: Diagnosis not present

## 2021-01-03 DIAGNOSIS — M25512 Pain in left shoulder: Secondary | ICD-10-CM | POA: Diagnosis not present

## 2021-01-03 DIAGNOSIS — R293 Abnormal posture: Secondary | ICD-10-CM | POA: Diagnosis not present

## 2021-01-03 DIAGNOSIS — Z170 Estrogen receptor positive status [ER+]: Secondary | ICD-10-CM | POA: Diagnosis not present

## 2021-01-03 DIAGNOSIS — R600 Localized edema: Secondary | ICD-10-CM | POA: Diagnosis not present

## 2021-01-04 ENCOUNTER — Ambulatory Visit
Admission: RE | Admit: 2021-01-04 | Discharge: 2021-01-04 | Disposition: A | Discharge status: Home / Self Care | Payer: Medicare Other | Source: Ambulatory Visit | Attending: Radiation Oncology | Admitting: Radiation Oncology

## 2021-01-04 ENCOUNTER — Other Ambulatory Visit: Payer: Self-pay

## 2021-01-04 ENCOUNTER — Ambulatory Visit: Payer: Medicare Other

## 2021-01-04 DIAGNOSIS — Z170 Estrogen receptor positive status [ER+]: Secondary | ICD-10-CM | POA: Diagnosis not present

## 2021-01-04 DIAGNOSIS — C50412 Malignant neoplasm of upper-outer quadrant of left female breast: Secondary | ICD-10-CM | POA: Diagnosis not present

## 2021-01-04 DIAGNOSIS — R600 Localized edema: Secondary | ICD-10-CM | POA: Diagnosis not present

## 2021-01-04 DIAGNOSIS — R293 Abnormal posture: Secondary | ICD-10-CM | POA: Diagnosis not present

## 2021-01-04 DIAGNOSIS — M25512 Pain in left shoulder: Secondary | ICD-10-CM | POA: Diagnosis not present

## 2021-01-05 ENCOUNTER — Ambulatory Visit
Admission: RE | Admit: 2021-01-05 | Discharge: 2021-01-05 | Disposition: A | Discharge status: Home / Self Care | Payer: Medicare Other | Source: Ambulatory Visit | Attending: Radiation Oncology | Admitting: Radiation Oncology

## 2021-01-05 ENCOUNTER — Ambulatory Visit: Payer: Medicare Other | Admitting: Rehabilitation

## 2021-01-05 ENCOUNTER — Other Ambulatory Visit: Payer: Self-pay

## 2021-01-05 ENCOUNTER — Encounter: Payer: Self-pay | Admitting: Rehabilitation

## 2021-01-05 ENCOUNTER — Encounter: Payer: Medicare Other | Admitting: Rehabilitation

## 2021-01-05 DIAGNOSIS — R600 Localized edema: Secondary | ICD-10-CM

## 2021-01-05 DIAGNOSIS — C50412 Malignant neoplasm of upper-outer quadrant of left female breast: Secondary | ICD-10-CM

## 2021-01-05 DIAGNOSIS — M25512 Pain in left shoulder: Secondary | ICD-10-CM | POA: Diagnosis not present

## 2021-01-05 DIAGNOSIS — Z170 Estrogen receptor positive status [ER+]: Secondary | ICD-10-CM

## 2021-01-05 DIAGNOSIS — R293 Abnormal posture: Secondary | ICD-10-CM

## 2021-01-05 DIAGNOSIS — Z483 Aftercare following surgery for neoplasm: Secondary | ICD-10-CM

## 2021-01-05 NOTE — Therapy (Signed)
[place], [place], [address] Phone: [phone]   Fax:  [phone]  Physical Therapy Treatment  Patient Details  Name: Dawn Powers MRN: [id number] Date of Birth: [birth year] Referring Provider (PT): Dr. Jana Hakim   Encounter Date: 01/05/2021   PT End of Session - 01/05/21 0954    Visit Number 15    Number of Visits 22    Date for PT Re-Evaluation 01/18/21    PT Start Time 0900    PT Stop Time 0953    PT Time Calculation (min) 53 min    Behavior During Therapy [facility] for tasks assessed/performed           Past Medical History:  Diagnosis Date  . Anxiety   . Arthritis   . Breast cancer ([place]) 09/19/2020   left breast IMC  . Cancer [facility])    Endometrial   . Family history of breast cancer   . Family history of kidney cancer   . Family history of prostate cancer   . Heart murmur   . Hyperlipidemia   . Hypertension   . Neuromuscular disorder ([place])    DDD  . Pain    lower back -hx "DDD"  . Radiation 11/15/15, 11/23/15, 11/30/15, 12/07/15, 12/14/15   HDR brachytherapy    Past Surgical History:  Procedure Laterality Date  . ABDOMINAL HYSTERECTOMY    . BREAST LUMPECTOMY WITH RADIOACTIVE SEED AND SENTINEL LYMPH NODE BIOPSY Left 10/19/2020   Procedure: LEFT BREAST LUMPECTOMY WITH RADIOACTIVE SEED AND SENTINEL LYMPH NODE BIOPSY;  Surgeon: Stark Klein, MD;  Location: [facility];  Service: General;  Laterality: Left;  [place], RNFA  . COLONOSCOPY    . FRACTURE SURGERY Right    ankle  . GANGLION CYST EXCISION    . LYMPH NODE BIOPSY N/A 09/26/2015   Procedure: SENTINEL LYMPH NODE BIOPSY;  Surgeon: Everitt Amber, MD;  [facility];  Service: Gynecology;  Laterality: N/A;  . ROBOTIC ASSISTED TOTAL HYSTERECTOMY WITH BILATERAL SALPINGO OOPHERECTOMY Bilateral 09/26/2015   Procedure: ROBOTIC ASSISTED TOTAL HYSTERECTOMY WITH BILATERAL SALPINGO OOPHORECTOMY;  Surgeon: Everitt Amber, MD;  [facility];  Service: Gynecology;  Laterality: Bilateral;  . TOTAL SHOULDER ARTHROPLASTY Left 09/10/2018   Procedure: TOTAL SHOULDER ARTHROPLASTY;  Surgeon: Justice Britain, MD;  Location: [facility];  Service: Orthopedics;  Laterality: Left;  143mn  . WISDOM TOOTH EXTRACTION      There were no vitals filed for this visit.   Subjective Assessment - 01/05/21 0904    Subjective I think I will check in again after radiation.  I am doing okay after all.    Pertinent History Pt. presents to PT s/p Left Breast Lumpectomy with SLNB on 10/19/2020.  She was diagnosed during a routine mammogram.  She has a history of Edometrial CA. with hysterectomy in 2016 and radiation in 2017. She had 3 LN removed.  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  She has not had pathology report yet.  She has a left TSA    Currently in Pain? No/denies                             [facility] - [facility]      Manual Therapy   Myofascial Release MFR to areas of cording at axilla, upper arm, antecubital fossa, and upper lower arm    Passive ROM PROM to  the left shoulder into flexion                    PT Short Term Goals - 12/14/20 1201      PT SHORT TERM GOAL #1   Title Pt will be independent with HEP for left shoulder ROM/ strength    Time 4    Period Weeks    Status Achieved             PT Long Term Goals - 12/29/20 0947      PT LONG TERM GOAL #1   Title Pt will be independent in self Manual Lymph drainage to decrease swelling    Time 4    Period Weeks    Status Achieved      PT LONG TERM GOAL #2   Baseline Pt has not gotten a compression bra yet and has prescription - pt did not like the sleeve and will wait    Time 4    Period Weeks    Status On-going      PT LONG TERM GOAL #3   Title Left shoulder AROM returned to normal pre surgery ROM per pt. (has left TSA)    Status Achieved      PT LONG TERM GOAL #4   Baseline slightly  more swollen secondary to radiation    Period Weeks    Status On-going    Target Date 01/18/21                 Plan - 01/05/21 0958    Clinical Impression Statement Pt is doing very well with only 3 radiation sessions remaining.  We will check in in 3 weeks post radiation to see if pt has any further needs regarding breast edema, cording, ROM, or strength education.  Pt continues with axillary cords and still reports benefits from [place].    PT Frequency 2x / week    PT Duration 6 weeks    PT Treatment/Interventions ADLs/Self Care Home Management;Therapeutic activities;Therapeutic exercise;Neuromuscular re-education;Manual techniques;Patient/family education;Orthotic Fit/Training;Manual lymph drainage;Passive range of motion;Scar mobilization    PT Next Visit Plan reassess after radiation, start more? update HEP?    Consulted and Agree with Plan of Care Patient           Patient will benefit from skilled therapeutic intervention in order to improve the following deficits and impairments:     Visit Diagnosis: Abnormal posture  Localized edema  Aftercare following surgery for neoplasm  Left shoulder pain, unspecified chronicity  Malignant neoplasm of upper-outer quadrant of left breast in female, estrogen receptor positive ([place])     Problem List Patient Active Problem List   Diagnosis Date Noted  . Genetic testing 12/18/2020  . Family history of prostate cancer   . Family history of breast cancer   . Family history of kidney cancer   . Malignant neoplasm of upper-outer quadrant of left breast in female, estrogen receptor positive ([place]) 09/21/2020  . Prediabetes 07/02/2019  . 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 Endometrial cancer ([place]) 09/26/2015  . Essential hypertension 05/23/2015  . Thoracic facet joint syndrome 04/27/2014  . Back pain 03/24/2014  . Muscle spasm 03/24/2014  . Scoliosis (and kyphoscoliosis), idiopathic 12/22/2012  . Osteopenia 12/22/2012   . Other and unspecified hyperlipidemia 12/22/2012    Stark Bray 01/05/2021, 10:00 AM  [place], [place], [address] Phone: [phone]   Fax:  [phone]  Name: Dawn Powers  MRN: [id number] Date of Birth: 19-Apr-1946

## 2021-01-08 ENCOUNTER — Other Ambulatory Visit: Payer: Self-pay

## 2021-01-08 ENCOUNTER — Ambulatory Visit
Admission: RE | Admit: 2021-01-08 | Discharge: 2021-01-08 | Disposition: A | Discharge status: Home / Self Care | Payer: Medicare Other | Source: Ambulatory Visit | Attending: Radiation Oncology | Admitting: Radiation Oncology

## 2021-01-08 DIAGNOSIS — Z170 Estrogen receptor positive status [ER+]: Secondary | ICD-10-CM | POA: Diagnosis not present

## 2021-01-08 DIAGNOSIS — M25512 Pain in left shoulder: Secondary | ICD-10-CM | POA: Diagnosis not present

## 2021-01-08 DIAGNOSIS — C50412 Malignant neoplasm of upper-outer quadrant of left female breast: Secondary | ICD-10-CM | POA: Diagnosis not present

## 2021-01-08 DIAGNOSIS — R293 Abnormal posture: Secondary | ICD-10-CM | POA: Diagnosis not present

## 2021-01-08 DIAGNOSIS — R600 Localized edema: Secondary | ICD-10-CM | POA: Diagnosis not present

## 2021-01-09 ENCOUNTER — Encounter: Payer: Self-pay | Admitting: *Deleted

## 2021-01-09 ENCOUNTER — Other Ambulatory Visit: Payer: Self-pay

## 2021-01-09 ENCOUNTER — Ambulatory Visit
Admission: RE | Admit: 2021-01-09 | Discharge: 2021-01-09 | Disposition: A | Discharge status: Home / Self Care | Payer: Medicare Other | Source: Ambulatory Visit | Attending: Radiation Oncology | Admitting: Radiation Oncology

## 2021-01-09 DIAGNOSIS — R293 Abnormal posture: Secondary | ICD-10-CM | POA: Diagnosis not present

## 2021-01-09 DIAGNOSIS — M25512 Pain in left shoulder: Secondary | ICD-10-CM | POA: Diagnosis not present

## 2021-01-09 DIAGNOSIS — C50412 Malignant neoplasm of upper-outer quadrant of left female breast: Secondary | ICD-10-CM | POA: Diagnosis not present

## 2021-01-09 DIAGNOSIS — R600 Localized edema: Secondary | ICD-10-CM | POA: Diagnosis not present

## 2021-01-09 DIAGNOSIS — Z170 Estrogen receptor positive status [ER+]: Secondary | ICD-10-CM | POA: Diagnosis not present

## 2021-01-09 NOTE — Progress Notes (Addendum)
[facility] [address] [place], [place] [address] [phone]  Date:  01/11/2021   Name:  Dawn Powers   DOB:  1946/01/18   MRN:  [id number]  PCP:  Darreld Mclean, MD    Chief Complaint: Hypertension (6 month follow up) and Breast Cancer   History of Present Illness:  Dawn Powers is a 75 y.o. very pleasant female patient who presents with the following:  Periodic follow-up visit today Last seen by myself about 5 months ago - history of pre-diabetes, HTN, endometrial cancer s/p hyst 2016, chronic back pain  Since our last visit she was dx with breast cancer ASSESSMENT: 75 y.o. [place] woman status post right breast upper outer quadrant biopsy 09/19/2020 for a clinical T1c N0, stage IA invasive ductal carcinoma, grade 3, estrogen and progesterone receptor positive, with an MIB-1 of 10% and HER-2 equivocal with FISH pending (1) genetics testing (2) s/p left lumpectomy and sentinel lymph node sampling 10/19/2020 for a pT1c pN0, stage IA invasive ductal carcinoma, grade 2, with negative margins (3) Oncotype score of 15 predicts a risk of recurrence outseide the breast over the next 9 years of 4 % if the patient's oinlysystemic treatment is anti-estrogens; it predicts not benefit from chemotherapy (4) adjuvant radiation as appropriate (5) anastrozole started neoadjuvantly 09/21/2020  She finished up radiation yesterday She has some skin burning but not terrible  Dexa is scheduled for later this summer She is on asastrozole for 5 years or longer. She is getting hot flashes but otherwise is doing ok with this  Colon 2016  She would like to check her B12 and vitamin D levels today She has noted some tinging in both her feet mostly at night Wearing socks helps She is having a hard time sleeping Trazodone is not helping her and gives her a dry mouth Patient Active Problem List   Diagnosis Date Noted  . Genetic  testing 12/18/2020  . Family history of prostate cancer   . Family history of breast cancer   . Family history of kidney cancer   . Malignant neoplasm of upper-outer quadrant of left breast in female, estrogen receptor positive ([place]) 09/21/2020  . Prediabetes 07/02/2019  . 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 Endometrial cancer ([place]) 09/26/2015  . Essential hypertension 05/23/2015  . Thoracic facet joint syndrome 04/27/2014  . Back pain 03/24/2014  . Muscle spasm 03/24/2014  . Scoliosis (and kyphoscoliosis), idiopathic 12/22/2012  . Osteopenia 12/22/2012  . Other and unspecified hyperlipidemia 12/22/2012    Past Medical History:  Diagnosis Date  . Anxiety   . Arthritis   . Breast cancer ([place]) 09/19/2020   left breast IMC  . Cancer [facility])    Endometrial   . Family history of breast cancer   . Family history of kidney cancer   . Family history of prostate cancer   . Heart murmur   . Hyperlipidemia   . Hypertension   . Neuromuscular disorder ([place])    DDD  . Pain    lower back -hx "DDD"  . Radiation 11/15/15, 11/23/15, 11/30/15, 12/07/15, 12/14/15   HDR brachytherapy    Past Surgical History:  Procedure Laterality Date  . ABDOMINAL HYSTERECTOMY    . BREAST LUMPECTOMY WITH RADIOACTIVE SEED AND SENTINEL LYMPH NODE BIOPSY Left 10/19/2020   Procedure: LEFT BREAST LUMPECTOMY WITH RADIOACTIVE SEED AND SENTINEL LYMPH NODE BIOPSY;  Surgeon: Stark Klein, MD;  Location: [facility];  Service: General;  Laterality: Left;  [place], RNFA  . COLONOSCOPY    . FRACTURE SURGERY Right    ankle  . GANGLION CYST EXCISION    . LYMPH NODE BIOPSY N/A 09/26/2015   Procedure: SENTINEL LYMPH NODE BIOPSY;  Surgeon: Everitt Amber, MD;  [facility];  Service: Gynecology;  Laterality: N/A;  . ROBOTIC ASSISTED TOTAL HYSTERECTOMY WITH BILATERAL SALPINGO OOPHERECTOMY Bilateral 09/26/2015   Procedure: ROBOTIC ASSISTED TOTAL HYSTERECTOMY WITH BILATERAL SALPINGO OOPHORECTOMY;  Surgeon:  Everitt Amber, MD;  [facility];  Service: Gynecology;  Laterality: Bilateral;  . TOTAL SHOULDER ARTHROPLASTY Left 09/10/2018   Procedure: TOTAL SHOULDER ARTHROPLASTY;  Surgeon: Justice Britain, MD;  Location: [facility];  Service: Orthopedics;  Laterality: Left;  157min  . WISDOM TOOTH EXTRACTION      Social History   Tobacco Use  . Smoking status: Former Smoker    Years: 4.00    Types: Cigarettes    Quit date: 09/30/1998    Years since quitting: 22.2  . Smokeless tobacco: Never Used  Vaping Use  . Vaping Use: Never used  Substance Use Topics  . Alcohol use: Yes    Alcohol/week: 3.0 standard drinks    Types: 3 Standard drinks or equivalent per week    Comment: 5 drinks per week  . Drug use: No    Family History  Problem Relation Age of Onset  . Heart disease Mother   . Alcohol abuse Mother        breast cancer - questionable   . Dementia Mother   . Alcohol abuse Father   . Prostate cancer Father 72       prostate and colon- unsure of primary   . Kidney cancer Brother 71  . Alcohol abuse Maternal Grandfather   . Alcohol abuse Sister   . Basal cell carcinoma Sister   . Breast cancer Cousin        double mastectomy, d. 66  . Prostate cancer Brother 37  . Esophageal cancer Neg Hx   . Stomach cancer Neg Hx   . Rectal cancer Neg Hx     Allergies  Allergen Reactions  . Morphine And Related Hives    Medication list has been reviewed and updated.  Current Outpatient Medications on File Prior to Visit  Medication Sig Dispense Refill  . anastrozole (ARIMIDEX) 1 MG tablet Take 1 tablet (1 mg total) by mouth daily. 90 tablet 4  . HYDROcodone-acetaminophen (NORCO/VICODIN) 5-325 MG tablet Take 1 tablet by mouth every 6 (six) hours as needed for moderate pain.    . traZODone (DESYREL) 50 MG tablet Take 1.5 tablets (75 mg total) by mouth at bedtime as needed for sleep. (Patient taking differently: Take 50 mg by mouth at bedtime as needed for sleep.) 135 tablet 1   No current  facility-administered medications on file prior to visit.    Review of Systems:  As per HPI- otherwise negative.   Physical Examination: Vitals:   01/11/21 1009 01/11/21 1033  BP: (!) 152/82 130/70  Pulse: (!) 58   Resp: 16   Temp: 98.6 F (37 C)   SpO2: 96%    Vitals:   01/11/21 1009  Weight: 121 lb (54.9 kg)  Height: $Remove'5\' 5"'[id number]$  (1.651 m)   Body mass index is 20.14 kg/m. Ideal Body Weight: Weight in (lb) to have BMI = 25: 149.9  GEN: no acute distress.  Normal weight HEENT: Atraumatic, Normocephalic. Bilateral TM wnl, oropharynx normal.  PEERL,EOMI.  Ears and Nose: No external deformity. CV: RRR, No M/G/R. No JVD. No thrill. No extra heart sounds. PULM: CTA B, no wheezes, crackles, rhonchi. No retractions. No resp. distress. No accessory muscle use. ABD: S, NT, ND, +BS. No rebound. No HSM. EXTR: No c/c/e PSYCH: Normally interactive. Conversant.  Looks well.  Mild irritation of the left chest wall from radiation, no skin breakdown Normal foot exam with normal pulses and monofilament sensation bilaterally Assessment and Plan: Numbness and tingling of both lower extremities - Plan: VITAMIN D 25 Hydroxy (Vit-D Deficiency, Fractures), Vitamin B12, Ferritin  Fatigue, unspecified type - Plan: VITAMIN D 25 Hydroxy (Vit-D Deficiency, Fractures), Vitamin B12  Hyperlipidemia, unspecified hyperlipidemia type - Plan: Lipid panel  Essential hypertension - Plan: losartan (COZAAR) 50 MG tablet  Vitamin D deficiency - Plan: VITAMIN D 25 Hydroxy (Vit-D Deficiency, Fractures)  History of anemia - Plan: Ferritin  Following up today Dawn Powers has recently completed radiation treatment for her breast cancer.  She is overall doing very well She notes some difficulty sleeping, some symptoms Indicate neuropathy.  For the time being she does not wish to take medication for either of these conditions, she will try over-the-counter sleep aids as needed We will check vitamin D, B12, ferritin also  due to paresthesias Continue current dose of losartan for blood pressure control Will plan further follow- up pending labs. Encourage weightbearing exercise to increase bone density This visit occurred during the SARS-CoV-2 public health emergency.  Safety protocols were in place, including screening questions prior to the visit, additional usage of staff PPE, and extensive cleaning of exam room while observing appropriate contact time as indicated for disinfecting solutions.    Signed Lamar Blinks, MD  Received her labs as below- 4/15- message to pt  Results for orders placed or performed in visit on 01/11/21  Lipid panel  Result Value Ref Range   Cholesterol 201 (H) 0 - 200 mg/dL   Triglycerides 69.0 0.0 - 149.0 mg/dL   HDL 82.80 >39.00 mg/dL   VLDL 13.8 0.0 - 40.0 mg/dL   LDL Cholesterol 104 (H) 0 - 99 mg/dL   Total CHOL/HDL Ratio 2    NonHDL 118.14   VITAMIN D 25 Hydroxy (Vit-D Deficiency, Fractures)  Result Value Ref Range   VITD 24.53 (L) 30.00 - 100.00 ng/mL  Vitamin B12  Result Value Ref Range   Vitamin B-12 185 (L) 211 - 911 pg/mL  Ferritin  Result Value Ref Range   Ferritin 70.7 10.0 - 291.0 ng/mL

## 2021-01-10 ENCOUNTER — Other Ambulatory Visit: Payer: Self-pay

## 2021-01-10 ENCOUNTER — Ambulatory Visit
Admission: RE | Admit: 2021-01-10 | Discharge: 2021-01-10 | Disposition: A | Discharge status: Home / Self Care | Payer: Medicare Other | Source: Ambulatory Visit | Attending: Radiation Oncology | Admitting: Radiation Oncology

## 2021-01-10 ENCOUNTER — Encounter: Payer: Self-pay | Admitting: Radiation Oncology

## 2021-01-10 DIAGNOSIS — Z170 Estrogen receptor positive status [ER+]: Secondary | ICD-10-CM | POA: Diagnosis not present

## 2021-01-10 DIAGNOSIS — R293 Abnormal posture: Secondary | ICD-10-CM | POA: Diagnosis not present

## 2021-01-10 DIAGNOSIS — M25512 Pain in left shoulder: Secondary | ICD-10-CM | POA: Diagnosis not present

## 2021-01-10 DIAGNOSIS — R600 Localized edema: Secondary | ICD-10-CM | POA: Diagnosis not present

## 2021-01-10 DIAGNOSIS — C50412 Malignant neoplasm of upper-outer quadrant of left female breast: Secondary | ICD-10-CM | POA: Diagnosis not present

## 2021-01-11 ENCOUNTER — Ambulatory Visit (INDEPENDENT_AMBULATORY_CARE_PROVIDER_SITE_OTHER): Payer: Medicare Other | Admitting: Family Medicine

## 2021-01-11 ENCOUNTER — Encounter: Payer: Self-pay | Admitting: Family Medicine

## 2021-01-11 ENCOUNTER — Other Ambulatory Visit: Payer: Self-pay

## 2021-01-11 VITALS — BP 130/70 | HR 58 | Temp 98.6°F | Resp 16 | Ht 65.0 in | Wt 121.0 lb

## 2021-01-11 DIAGNOSIS — I10 Essential (primary) hypertension: Secondary | ICD-10-CM

## 2021-01-11 DIAGNOSIS — R200 Anesthesia of skin: Secondary | ICD-10-CM | POA: Diagnosis not present

## 2021-01-11 DIAGNOSIS — R5383 Other fatigue: Secondary | ICD-10-CM | POA: Diagnosis not present

## 2021-01-11 DIAGNOSIS — E559 Vitamin D deficiency, unspecified: Secondary | ICD-10-CM | POA: Diagnosis not present

## 2021-01-11 DIAGNOSIS — Z862 Personal history of diseases of the blood and blood-forming organs and certain disorders involving the immune mechanism: Secondary | ICD-10-CM

## 2021-01-11 DIAGNOSIS — E785 Hyperlipidemia, unspecified: Secondary | ICD-10-CM | POA: Diagnosis not present

## 2021-01-11 DIAGNOSIS — R202 Paresthesia of skin: Secondary | ICD-10-CM

## 2021-01-11 LAB — VITAMIN B12: Vitamin B-12: 185 pg/mL — ABNORMAL LOW (ref 211–911)

## 2021-01-11 LAB — LIPID PANEL
Cholesterol: 201 mg/dL — ABNORMAL HIGH (ref 0–200)
HDL: 82.8 mg/dL (ref >39.00)
LDL Cholesterol: 104 mg/dL — ABNORMAL HIGH (ref 0–99)
NonHDL: 118.14
Total CHOL/HDL Ratio: 2
Triglycerides: 69 mg/dL (ref 0.0–149.0)
VLDL: 13.8 mg/dL (ref 0.0–40.0)

## 2021-01-11 LAB — FERRITIN: Ferritin: 70.7 ng/mL (ref 10.0–291.0)

## 2021-01-11 LAB — VITAMIN D 25 HYDROXY (VIT D DEFICIENCY, FRACTURES): VITD: 24.53 ng/mL — ABNORMAL LOW (ref 30.00–100.00)

## 2021-01-11 MED ORDER — LOSARTAN POTASSIUM 50 MG PO TABS
75.0000 mg | ORAL_TABLET | Freq: Every day | ORAL | 3 refills | Status: DC
Start: 2021-01-11 — End: 2022-01-16

## 2021-01-11 NOTE — Patient Instructions (Addendum)
You might try melatonin and/ or OTC doxylamine for sleep (unisom)  I will be in touch with your labs asap Assuming all is well please see me in about 6 months We do have other mediations you could use for your foot tingling if you wish- keep me posted

## 2021-01-12 ENCOUNTER — Encounter: Payer: Self-pay | Admitting: Family Medicine

## 2021-01-19 DIAGNOSIS — C50412 Malignant neoplasm of upper-outer quadrant of left female breast: Secondary | ICD-10-CM | POA: Diagnosis not present

## 2021-01-19 DIAGNOSIS — Z170 Estrogen receptor positive status [ER+]: Secondary | ICD-10-CM | POA: Diagnosis not present

## 2021-01-30 ENCOUNTER — Encounter: Payer: Self-pay | Admitting: Family Medicine

## 2021-01-30 DIAGNOSIS — L538 Other specified erythematous conditions: Secondary | ICD-10-CM | POA: Diagnosis not present

## 2021-01-30 DIAGNOSIS — B029 Zoster without complications: Secondary | ICD-10-CM | POA: Diagnosis not present

## 2021-01-30 DIAGNOSIS — D485 Neoplasm of uncertain behavior of skin: Secondary | ICD-10-CM | POA: Diagnosis not present

## 2021-01-31 ENCOUNTER — Ambulatory Visit: Payer: Medicare Other

## 2021-02-07 ENCOUNTER — Telehealth: Payer: Self-pay

## 2021-02-07 DIAGNOSIS — E538 Deficiency of other specified B group vitamins: Secondary | ICD-10-CM

## 2021-02-07 DIAGNOSIS — E559 Vitamin D deficiency, unspecified: Secondary | ICD-10-CM

## 2021-02-07 DIAGNOSIS — O280 Abnormal hematological finding on antenatal screening of mother: Secondary | ICD-10-CM

## 2021-02-07 NOTE — Telephone Encounter (Signed)
Pt called and wanted to schedule 3 mon labs for Vitamin D but no future lab order is in the system . Please advise the patient. -JMA

## 2021-02-07 NOTE — Addendum Note (Signed)
Addended by: Lamar Blinks C on: 02/07/2021 04:29 PM   Modules accepted: Orders

## 2021-02-07 NOTE — Telephone Encounter (Signed)
I have pended vit D- ok to schedule her lab appt?

## 2021-02-07 NOTE — Addendum Note (Signed)
Addended by: Wynonia Musty A on: 02/07/2021 02:37 PM   Modules accepted: Orders

## 2021-02-08 ENCOUNTER — Ambulatory Visit: Payer: Medicare Other | Attending: Oncology

## 2021-02-08 ENCOUNTER — Telehealth: Payer: Self-pay

## 2021-02-08 ENCOUNTER — Other Ambulatory Visit: Payer: Self-pay

## 2021-02-08 DIAGNOSIS — Z483 Aftercare following surgery for neoplasm: Secondary | ICD-10-CM | POA: Diagnosis not present

## 2021-02-08 DIAGNOSIS — C50412 Malignant neoplasm of upper-outer quadrant of left female breast: Secondary | ICD-10-CM | POA: Diagnosis not present

## 2021-02-08 DIAGNOSIS — Z170 Estrogen receptor positive status [ER+]: Secondary | ICD-10-CM

## 2021-02-08 DIAGNOSIS — R293 Abnormal posture: Secondary | ICD-10-CM | POA: Diagnosis not present

## 2021-02-08 DIAGNOSIS — R600 Localized edema: Secondary | ICD-10-CM | POA: Diagnosis not present

## 2021-02-08 DIAGNOSIS — M25512 Pain in left shoulder: Secondary | ICD-10-CM | POA: Insufficient documentation

## 2021-02-08 NOTE — Therapy (Addendum)
[place], [place], [address] Phone: [phone]   Fax:  [phone]  Physical Therapy Treatment  Patient Details  Name: Dawn Powers MRN: [id number] Date of Birth: [birth year] Referring Provider (PT): Dr. Aleda Grana   Encounter Date: 02/08/2021   PT End of Session - 02/08/21 1551    Visit Number 16 use KX modifier next   Number of Visits 24    Date for PT Re-Evaluation 03/08/21    Authorization Type MC    PT Start Time 1502    PT Stop Time 1545    PT Time Calculation (min) 43 min    Activity Tolerance Patient tolerated treatment well    Behavior During Therapy [facility] for tasks assessed/performed           Past Medical History:  Diagnosis Date  . Anxiety   . Arthritis   . Breast cancer ([place]) 09/19/2020   left breast IMC  . Cancer [facility])    Endometrial   . Family history of breast cancer   . Family history of kidney cancer   . Family history of prostate cancer   . Heart murmur   . Hyperlipidemia   . Hypertension   . Neuromuscular disorder ([place])    DDD  . Pain    lower back -hx "DDD"  . Radiation 11/15/15, 11/23/15, 11/30/15, 12/07/15, 12/14/15   HDR brachytherapy    Past Surgical History:  Procedure Laterality Date  . ABDOMINAL HYSTERECTOMY    . BREAST LUMPECTOMY WITH RADIOACTIVE SEED AND SENTINEL LYMPH NODE BIOPSY Left 10/19/2020   Procedure: LEFT BREAST LUMPECTOMY WITH RADIOACTIVE SEED AND SENTINEL LYMPH NODE BIOPSY;  Surgeon: Stark Klein, MD;  Location: [facility];  Service: General;  Laterality: Left;  [place], RNFA  . COLONOSCOPY    . FRACTURE SURGERY Right    ankle  . GANGLION CYST EXCISION    . LYMPH NODE BIOPSY N/A 09/26/2015   Procedure: SENTINEL LYMPH NODE BIOPSY;  Surgeon: Everitt Amber, MD;  [facility];  Service: Gynecology;  Laterality: N/A;  . ROBOTIC ASSISTED TOTAL HYSTERECTOMY WITH BILATERAL SALPINGO OOPHERECTOMY Bilateral 09/26/2015   Procedure:  ROBOTIC ASSISTED TOTAL HYSTERECTOMY WITH BILATERAL SALPINGO OOPHORECTOMY;  Surgeon: Everitt Amber, MD;  [facility];  Service: Gynecology;  Laterality: Bilateral;  . TOTAL SHOULDER ARTHROPLASTY Left 09/10/2018   Procedure: TOTAL SHOULDER ARTHROPLASTY;  Surgeon: Justice Britain, MD;  Location: [facility];  Service: Orthopedics;  Laterality: Left;  111min  . WISDOM TOOTH EXTRACTION      There were no vitals filed for this visit.   Subjective Assessment - 02/08/21 1503    Subjective Radiation ended about a month ago. Last week I was really sore and swollen, but this week feels a little better. Skin did relatively well with it but I had some soreness and swelling.  I have gotten tighter again in the left axillary region. I still feel a little swollen. I still don't have my energy back after the radiation    Pertinent History Pt. presents to PT s/p Left Breast Lumpectomy with SLNB on 10/19/2020.  She was diagnosed during a routine mammogram.  She has a history of Edometrial CA. with hysterectomy in 2016 and radiation in 2017. She had 3 LN removed.  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  She has not had pathology report yet.  She has a left TSA    Patient Stated Goals check breast for swelling, axilla and  arm for tightness    Currently in Pain? Yes    Pain Score 3     Pain Location Axilla    Pain Orientation Left    Pain Descriptors / Indicators Tightness    Pain Onset 1 to 4 weeks ago    Pain Frequency Constant    Aggravating Factors  reaching, stretching.    Effect of Pain on Daily Activities limits use of left UE    Multiple Pain Sites No              [facility] PT Assessment - 02/08/21 0001      Assessment   Medical Diagnosis Left Breast Cancer    Referring Provider (PT) Dr. Aleda Grana    Onset Date/Surgical Date 10/19/20    Hand Dominance Right      Balance Screen   Has the patient fallen in the past 6 months No    Has the patient had a decrease in activity  level because of a fear of falling?  No    Is the patient reluctant to leave their home because of a fear of falling?  No      Observation/Other Assessments   Observations multiple cords noted in left axillary region restricting ROM      Observation/Other Assessments-Edema    Edema --   mild edema noted at  leftlumpectomy incision with fibrosis, mild breast swelling observed     Posture/Postural Control   Postural Limitations Rounded Shoulders;Forward head      AROM   Right Shoulder Extension 49 Degrees    Right Shoulder Flexion 156 Degrees    Right Shoulder ABduction 150 Degrees   pain in right shoulder   Left Shoulder Extension 36 Degrees    Left Shoulder Flexion 125 Degrees    Left Shoulder ABduction 115 Degrees      Palpation   Palpation comment tenderness at left lateral breast, axilary region             LYMPHEDEMA/ONCOLOGY QUESTIONNAIRE - 02/08/21 0001      Left Upper Extremity Lymphedema   At Axilla  28 cm (P)     15 cm Proximal to Olecranon Process 27.8 cm (P)     10 cm Proximal to Olecranon Process 25.9 cm (P)     Olecranon Process 21.6 cm (P)     15 cm Proximal to Ulnar Styloid Process 19.5 cm (P)     10 cm Proximal to Ulnar Styloid Process 16.2 cm (P)     Just Proximal to Ulnar Styloid Process 14 cm (P)     Across Hand at [employer] 18.2 cm (P)     At [place] of 2nd Digit 5.9 cm (P)                       [facility] Adult PT Treatment/Exercise - 02/08/21 0001      Manual Therapy   Myofascial Release MFR to areas of cording left axilla, medial arm    Passive ROM PROM to the left shoulder into flexion                    PT Short Term Goals - 12/14/20 1201      PT SHORT TERM GOAL #1   Title Pt will be independent with HEP for left shoulder ROM/ strength    Time 4    Period Weeks    Status Achieved  PT Long Term Goals - 02/08/21 1556      PT LONG TERM GOAL #1   Title Pt will be independent in self Manual Lymph  drainage to decrease swelling    Baseline needs further review    Period Weeks    Status On-going      PT LONG TERM GOAL #2   Title Pt will be fit for compression bra/sleeve prn    Baseline has bra, does not want sleeve    Time 4    Period Weeks    Status New      PT LONG TERM GOAL #3   Title Left shoulder AROM returned to normal pre surgery ROM per pt. (has left TSA)    Baseline now decreased secondary to cording/radiation    Time 4    Period Weeks    Status On-going      PT LONG TERM GOAL #4   Title Left chest/trunk swelling will be reduced greater than 50% per pt and will have no discomfort    Baseline slightly more swollen secondary to radiation    Time 4    Period Weeks    Status On-going      PT LONG TERM GOAL #5   Title Pt will have no left shouder limitations due to cording    Time 4    Period Weeks    Status New    Target Date 03/08/21                 Plan - 02/08/21 1552    Clinical Impression Statement Pt is 1 month s/p completion of radiation.  She presents with limitations in left shoulder ROM with multiple cords still present.  She is very tender at the left medial arm and lateral breast.  There is mild breast swelling with fibrosis at lumpectomy incision. She will benefit from skilled PT to address deficits and return to PLOF    Personal Factors and Comorbidities Comorbidity 3+    Comorbidities left breast CA with SLNB, prior endometrial CA, Left TSA, Back spasms    Stability/Clinical Decision Making Stable/Uncomplicated    Rehab Potential Excellent    PT Frequency 2x / week    PT Duration 4 weeks    PT Treatment/Interventions ADLs/Self Care Home Management;Therapeutic activities;Therapeutic exercise;Neuromuscular re-education;Manual techniques;Patient/family education;Orthotic Fit/Training;Manual lymph drainage;Passive range of motion;Scar mobilization    PT Next Visit Plan USE KX modifier and talk to pt about it.MFR to left areas of cording, PROM,  MLD and instruct pt, (pt will star using chip pack at lupectomy incision to decrease fibrosis    Consulted and Agree with Plan of Care Patient           Patient will benefit from skilled therapeutic intervention in order to improve the following deficits and impairments:  Decreased mobility,Decreased strength,Increased edema,Impaired sensation,Postural dysfunction,Decreased scar mobility,Pain,Decreased skin integrity,Decreased knowledge of precautions,Decreased activity tolerance,Impaired UE functional use,Decreased range of motion  Visit Diagnosis: Abnormal posture  Localized edema  Aftercare following surgery for neoplasm  Left shoulder pain, unspecified chronicity  Malignant neoplasm of upper-outer quadrant of left breast in female, estrogen receptor positive ([place])     Problem List Patient Active Problem List   Diagnosis Date Noted  . Genetic testing 12/18/2020  . Family history of prostate cancer   . Family history of breast cancer   . Family history of kidney cancer   . Malignant neoplasm of upper-outer quadrant of left breast in female, estrogen receptor positive ([place]) 09/21/2020  .  Prediabetes 07/02/2019  . 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 Endometrial cancer ([place]) 09/26/2015  . Essential hypertension 05/23/2015  . Thoracic facet joint syndrome 04/27/2014  . Back pain 03/24/2014  . Muscle spasm 03/24/2014  . Scoliosis (and kyphoscoliosis), idiopathic 12/22/2012  . Osteopenia 12/22/2012  . Other and unspecified hyperlipidemia 12/22/2012    Claris Pong 02/08/2021, 3:59 PM  [place], [place], [address] Phone: [phone]   Fax:  [phone]  Name: Dawn Powers MRN: [id number] Date of Birth: 1946-02-18 Cheral Almas, PT 02/08/21 4:00 PM

## 2021-02-08 NOTE — Telephone Encounter (Signed)
Patient has been placed on lab schedule in July per request of pcp in last lab results to recheck vitamin D and B12. Orders are placed as future.

## 2021-02-08 NOTE — Addendum Note (Signed)
Addended by: Wynonia Musty A on: 02/08/2021 10:41 AM   Modules accepted: Orders

## 2021-02-08 NOTE — Telephone Encounter (Signed)
I called the patient today about her upcoming follow-up appointment in radiation oncology.   Given the state of the COVID-19 pandemic, concerning case numbers in our community, and guidance from [facility], I offered a phone assessment with the patient to determine if coming to the clinic was necessary. She accepted.  I let the patient know that I had spoken with Dr. Isidore Moos, and she wanted them to know the importance of washing their hands for at least 20 seconds at a time, especially after going out in public, and before they eat.  Limit going out in public whenever possible. Do not touch your face, unless your hands are clean, such as when bathing. Get plenty of rest, eat well, and stay hydrated. Patient verbalized understanding and agreement.  The patient denies any major concerns. She does report that she had a [place]'s flare-up about 10 days ago, but it has since resolved and she was able to resume physical therapy today. She continues to deal with fatigue, but she states it's manageable. Reports a stable appetite but states her sleep routine is not ideal because of the hot flashes from her anastrozole.  Specifically, she reports good healing of her skin in the radiation fields.  Skin is intact.  She did mention a mole to her axilla that she is worried about, but she states it was present even before radiation. I encouraged her to continue to monitor the area and watch for any changes in size, color, shape, etc; in which case she should have site evaluated by a dermatologist. I recommended that she continue skin care by applying oil or lotion with vitamin E to the skin in the radiation fields, BID, for 2 more months.    Continue follow-up with medical oncology - follow-up is scheduled on 05/08/2021 with Dr. Jana Hakim.  I explained that yearly mammograms are important for patients with intact breast tissue, and physical exams are important after mastectomy for patients that cannot undergo  mammography.  I encouraged her to call if she had further questions or concerns about her healing. Otherwise, she will follow-up PRN in radiation oncology. Patient is pleased with this plan, and we will cancel her upcoming follow-up to reduce the risk of COVID-19 transmission.

## 2021-02-09 ENCOUNTER — Encounter: Payer: Self-pay | Admitting: Radiation Oncology

## 2021-02-09 ENCOUNTER — Ambulatory Visit: Payer: Medicare Other | Admitting: Radiation Oncology

## 2021-02-14 NOTE — Progress Notes (Signed)
I called the patient today to check on her. She deferred an in person follow-up, but upon nursing assessment by telephone, she had mentioned to [place] that she had had a shingles outbreak on her chest following completion of radiation therapy.  Fortunately it was identified by her doctor early and she started antiviral medication with relatively little pain.  We talked about how this is an uncommon occurrence after radiation therapy to the chest, but theoretically radiation can increase the risk of an outbreak.  Interestingly she has been vaccinated against shingles.   She is recovering well and pleased with the resolution of her skin irritation thus far.  She has follow-up scheduled with medical oncology.  She expressed appreciation for her care here in radiation oncology.  She knows to contact us if she has any needs in the future and I will see her back PRN.  I wished her the best. -----------------------------------  Eppie Gibson, MD

## 2021-02-15 ENCOUNTER — Ambulatory Visit: Payer: Medicare Other | Admitting: Rehabilitation

## 2021-02-15 ENCOUNTER — Other Ambulatory Visit: Payer: Self-pay

## 2021-02-15 ENCOUNTER — Encounter: Payer: Self-pay | Admitting: Rehabilitation

## 2021-02-15 DIAGNOSIS — Z483 Aftercare following surgery for neoplasm: Secondary | ICD-10-CM | POA: Diagnosis not present

## 2021-02-15 DIAGNOSIS — C50412 Malignant neoplasm of upper-outer quadrant of left female breast: Secondary | ICD-10-CM | POA: Diagnosis not present

## 2021-02-15 DIAGNOSIS — R600 Localized edema: Secondary | ICD-10-CM

## 2021-02-15 DIAGNOSIS — M25512 Pain in left shoulder: Secondary | ICD-10-CM

## 2021-02-15 DIAGNOSIS — Z170 Estrogen receptor positive status [ER+]: Secondary | ICD-10-CM

## 2021-02-15 DIAGNOSIS — R293 Abnormal posture: Secondary | ICD-10-CM

## 2021-02-15 NOTE — Therapy (Signed)
[place], [place], [address] Phone: [phone]   Fax:  [phone]  Physical Therapy Treatment  Patient Details  Name: Dawn Powers MRN: [id number] Date of Birth: [birth year] Referring Provider (PT): Dr. Aleda Grana   Encounter Date: 02/15/2021   PT End of Session - 02/15/21 1245    Visit Number 17    Number of Visits 24    Date for PT Re-Evaluation 03/08/21    Authorization - Number of Visits 20    PT Start Time 1104    PT Stop Time 1159    PT Time Calculation (min) 55 min    Activity Tolerance Patient tolerated treatment well    Behavior During Therapy [facility] for tasks assessed/performed           Past Medical History:  Diagnosis Date  . Anxiety   . Arthritis   . Breast cancer ([place]) 09/19/2020   left breast IMC  . Cancer [facility])    Endometrial   . Family history of breast cancer   . Family history of kidney cancer   . Family history of prostate cancer   . Heart murmur   . Hyperlipidemia   . Hypertension   . Neuromuscular disorder ([place])    DDD  . Pain    lower back -hx "DDD"  . Radiation 11/15/15, 11/23/15, 11/30/15, 12/07/15, 12/14/15   HDR brachytherapy    Past Surgical History:  Procedure Laterality Date  . ABDOMINAL HYSTERECTOMY    . BREAST LUMPECTOMY WITH RADIOACTIVE SEED AND SENTINEL LYMPH NODE BIOPSY Left 10/19/2020   Procedure: LEFT BREAST LUMPECTOMY WITH RADIOACTIVE SEED AND SENTINEL LYMPH NODE BIOPSY;  Surgeon: Stark Klein, MD;  Location: [facility];  Service: General;  Laterality: Left;  [place], RNFA  . COLONOSCOPY    . FRACTURE SURGERY Right    ankle  . GANGLION CYST EXCISION    . LYMPH NODE BIOPSY N/A 09/26/2015   Procedure: SENTINEL LYMPH NODE BIOPSY;  Surgeon: Everitt Amber, MD;  [facility];  Service: Gynecology;  Laterality: N/A;  . ROBOTIC ASSISTED TOTAL HYSTERECTOMY WITH BILATERAL SALPINGO OOPHERECTOMY Bilateral 09/26/2015   Procedure: ROBOTIC  ASSISTED TOTAL HYSTERECTOMY WITH BILATERAL SALPINGO OOPHORECTOMY;  Surgeon: Everitt Amber, MD;  [facility];  Service: Gynecology;  Laterality: Bilateral;  . TOTAL SHOULDER ARTHROPLASTY Left 09/10/2018   Procedure: TOTAL SHOULDER ARTHROPLASTY;  Surgeon: Justice Britain, MD;  Location: [facility];  Service: Orthopedics;  Laterality: Left;  156min  . WISDOM TOOTH EXTRACTION      There were no vitals filed for this visit.   Subjective Assessment - 02/15/21 1105    Subjective I don't like it.  It just always feels like rubber on rubber    Pertinent History Pt. presents to PT s/p Left Breast Lumpectomy with SLNB on 10/19/2020.  She was diagnosed during a routine mammogram.  She has a history of Edometrial CA. with hysterectomy in 2016 and radiation in 2017. She had 3 LN removed.  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  She has not had pathology report yet.  She has a left TSA    Currently in Pain? No/denies   when I take my bra off it is 3/10                            [facility] Adult PT Treatment/Exercise - 02/15/21 0001      Manual Therapy  Manual Therapy Soft tissue mobilization    Soft tissue mobilization lateral breast, scar tissue work included, pectoralis, latissimus    Myofascial Release MFR to areas of cording left axilla, lateral trunk, axilla    Manual Lymphatic Drainage (MLD) short neck, Rt axillary nodes, sternal nodes and focus on SCF and lateral breast towards LNs    Passive ROM PROM to the left shoulder into flexion                    PT Short Term Goals - 12/14/20 1201      PT SHORT TERM GOAL #1   Title Pt will be independent with HEP for left shoulder ROM/ strength    Time 4    Period Weeks    Status Achieved             PT Long Term Goals - 02/08/21 1556      PT LONG TERM GOAL #1   Title Pt will be independent in self Manual Lymph drainage to decrease swelling    Baseline needs further review    Period Weeks     Status On-going      PT LONG TERM GOAL #2   Title Pt will be fit for compression bra/sleeve prn    Baseline has bra, does not want sleeve    Time 4    Period Weeks    Status New      PT LONG TERM GOAL #3   Title Left shoulder AROM returned to normal pre surgery ROM per pt. (has left TSA)    Baseline now decreased secondary to cording/radiation    Time 4    Period Weeks    Status On-going      PT LONG TERM GOAL #4   Title Left chest/trunk swelling will be reduced greater than 50% per pt and will have no discomfort    Baseline slightly more swollen secondary to radiation    Time 4    Period Weeks    Status On-going      PT LONG TERM GOAL #5   Title Pt will have no left shouder limitations due to cording    Time 4    Period Weeks    Status New    Target Date 03/08/21                 Plan - 02/15/21 1246    Clinical Impression Statement Restarted PT treatment with focus on MFR in the Left axilla, cording release, and scar tissue mobilization around the incision.  Pt reports improved feeling post MT    PT Frequency 2x / week    PT Duration 4 weeks    PT Treatment/Interventions ADLs/Self Care Home Management;Therapeutic activities;Therapeutic exercise;Neuromuscular re-education;Manual techniques;Patient/family education;Orthotic Fit/Training;Manual lymph drainage;Passive range of motion;Scar mobilization    PT Next Visit Plan MFR to left areas of cording, PROM, MLD and instruct pt, (pt will star using chip pack at lupectomy incision to decrease fibrosis    Consulted and Agree with Plan of Care Patient           Patient will benefit from skilled therapeutic intervention in order to improve the following deficits and impairments:     Visit Diagnosis: Abnormal posture  Localized edema  Aftercare following surgery for neoplasm  Left shoulder pain, unspecified chronicity  Malignant neoplasm of upper-outer quadrant of left breast in female, estrogen receptor positive  [facility])     Problem List Patient Active Problem List   Diagnosis Date  Noted  . Genetic testing 12/18/2020  . Family history of prostate cancer   . Family history of breast cancer   . Family history of kidney cancer   . Malignant neoplasm of upper-outer quadrant of left breast in female, estrogen receptor positive ([place]) 09/21/2020  . Prediabetes 07/02/2019  . 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 Endometrial cancer ([place]) 09/26/2015  . Essential hypertension 05/23/2015  . Thoracic facet joint syndrome 04/27/2014  . Back pain 03/24/2014  . Muscle spasm 03/24/2014  . Scoliosis (and kyphoscoliosis), idiopathic 12/22/2012  . Osteopenia 12/22/2012  . Other and unspecified hyperlipidemia 12/22/2012    Stark Bray 02/15/2021, 12:49 PM  [place], [place], [address] Phone: [phone]   Fax:  [phone]  Name: JASIEL APACHITO MRN: [id number] Date of Birth: 07-19-1946

## 2021-02-21 ENCOUNTER — Ambulatory Visit: Payer: Medicare Other

## 2021-02-21 ENCOUNTER — Other Ambulatory Visit: Payer: Self-pay

## 2021-02-21 DIAGNOSIS — Z483 Aftercare following surgery for neoplasm: Secondary | ICD-10-CM | POA: Diagnosis not present

## 2021-02-21 DIAGNOSIS — R600 Localized edema: Secondary | ICD-10-CM | POA: Diagnosis not present

## 2021-02-21 DIAGNOSIS — R293 Abnormal posture: Secondary | ICD-10-CM

## 2021-02-21 DIAGNOSIS — C50412 Malignant neoplasm of upper-outer quadrant of left female breast: Secondary | ICD-10-CM

## 2021-02-21 DIAGNOSIS — M25512 Pain in left shoulder: Secondary | ICD-10-CM | POA: Diagnosis not present

## 2021-02-21 DIAGNOSIS — Z170 Estrogen receptor positive status [ER+]: Secondary | ICD-10-CM | POA: Diagnosis not present

## 2021-02-21 NOTE — Therapy (Signed)
[place], [place], [address] Phone: [phone]   Fax:  [phone]  Physical Therapy Treatment  Patient Details  Name: Dawn Powers MRN: [id number] Date of Birth: 01/19/1946 Referring Provider (PT): Dr. Aleda Grana   Encounter Date: 02/21/2021   PT End of Session - 02/21/21 0852    Visit Number 18    Number of Visits 24    Authorization Type MC    PT Start Time 0803    PT Stop Time 0846    PT Time Calculation (min) 43 min    Activity Tolerance Patient tolerated treatment well    Behavior During Therapy [facility] for tasks assessed/performed           Past Medical History:  Diagnosis Date  . Anxiety   . Arthritis   . Breast cancer ([place]) 09/19/2020   left breast IMC  . Cancer [facility])    Endometrial   . Family history of breast cancer   . Family history of kidney cancer   . Family history of prostate cancer   . Heart murmur   . Hyperlipidemia   . Hypertension   . Neuromuscular disorder ([place])    DDD  . Pain    lower back -hx "DDD"  . Radiation 11/15/15, 11/23/15, 11/30/15, 12/07/15, 12/14/15   HDR brachytherapy    Past Surgical History:  Procedure Laterality Date  . ABDOMINAL HYSTERECTOMY    . BREAST LUMPECTOMY WITH RADIOACTIVE SEED AND SENTINEL LYMPH NODE BIOPSY Left 10/19/2020   Procedure: LEFT BREAST LUMPECTOMY WITH RADIOACTIVE SEED AND SENTINEL LYMPH NODE BIOPSY;  Surgeon: Stark Klein, MD;  Location: [facility];  Service: General;  Laterality: Left;  [place], RNFA  . COLONOSCOPY    . FRACTURE SURGERY Right    ankle  . GANGLION CYST EXCISION    . LYMPH NODE BIOPSY N/A 09/26/2015   Procedure: SENTINEL LYMPH NODE BIOPSY;  Surgeon: Everitt Amber, MD;  [facility];  Service: Gynecology;  Laterality: N/A;  . ROBOTIC ASSISTED TOTAL HYSTERECTOMY WITH BILATERAL SALPINGO OOPHERECTOMY Bilateral 09/26/2015   Procedure: ROBOTIC ASSISTED TOTAL HYSTERECTOMY WITH BILATERAL SALPINGO  OOPHORECTOMY;  Surgeon: Everitt Amber, MD;  [facility];  Service: Gynecology;  Laterality: Bilateral;  . TOTAL SHOULDER ARTHROPLASTY Left 09/10/2018   Procedure: TOTAL SHOULDER ARTHROPLASTY;  Surgeon: Justice Britain, MD;  Location: [facility];  Service: Orthopedics;  Laterality: Left;  140mn  . WISDOM TOOTH EXTRACTION      There were no vitals filed for this visit.   Subjective Assessment - 02/21/21 0801    Subjective I am getting discouraged with the [place] feeling.  The nipple is red and itchy today.  Its been like that for awhile but seems more aggravated now.  Saw a dermatologist and I am putting on CCeledonio Savageand some of the radiology cream.   Shoulder ROM is doing pretty well and I did yoga yesterday which felt really good.    Pertinent History Pt. presents to PT s/p Left Breast Lumpectomy with SLNB on 10/19/2020.  She was diagnosed during a routine mammogram.  She has a history of Edometrial CA. with hysterectomy in 2016 and radiation in 2017. She had 3 LN removed.  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  She has not had pathology report yet.  She has a left TSA    Patient Stated Goals check breast for swelling, axilla and arm for tightness    Currently in Pain?  Yes    Pain Score 4     Pain Location Back    Pain Orientation Right;Left    Pain Descriptors / Indicators Aching;Sharp    Pain Type Chronic pain    Pain Onset More than a month ago    Pain Frequency Intermittent    Multiple Pain Sites No                             [facility] Adult PT Treatment/Exercise - 02/21/21 0001      Manual Therapy   Soft tissue mobilization lateral breast, scar tissue work included, pectoralis, latissimus    Myofascial Release MFR to areas of cording left axilla, lateral trunk, axilla    Manual Lymphatic Drainage (MLD) short neck, bilateral axillary LN's, anterior interaxillary pathway,left axilloinguinal pathway and left breast retracing all pathways.  Pt  instructed in same.    Passive ROM PROM to the left shoulder into flexion, abd, ER, D2 flexion                    PT Short Term Goals - 12/14/20 1201      PT SHORT TERM GOAL #1   Title Pt will be independent with HEP for left shoulder ROM/ strength    Time 4    Period Weeks    Status Achieved             PT Long Term Goals - 02/08/21 1556      PT LONG TERM GOAL #1   Title Pt will be independent in self Manual Lymph drainage to decrease swelling    Baseline needs further review    Period Weeks    Status On-going      PT LONG TERM GOAL #2   Title Pt will be fit for compression bra/sleeve prn    Baseline has bra, does not want sleeve    Time 4    Period Weeks    Status New      PT LONG TERM GOAL #3   Title Left shoulder AROM returned to normal pre surgery ROM per pt. (has left TSA)    Baseline now decreased secondary to cording/radiation    Time 4    Period Weeks    Status On-going      PT LONG TERM GOAL #4   Title Left chest/trunk swelling will be reduced greater than 50% per pt and will have no discomfort    Baseline slightly more swollen secondary to radiation    Time 4    Period Weeks    Status On-going      PT LONG TERM GOAL #5   Title Pt will have no left shouder limitations due to cording    Time 4    Period Weeks    Status New    Target Date 03/08/21                 Plan - 02/21/21 8115    Clinical Impression Statement treatment consisted of soft tissue mobilization to left pectoralis and lats and lateral breast incision, MFR to axilla and medial arm areas of cording, PROM of left shoulder and MLD  to left breast and review of MLD techniques with pt.  Pt. did an excellent job with MLD techniques and was encouraged to do on a daily basis.  Chip pack works but she can only tolerate it 2 hours at a time.  She has new onset of  itching and mild redness at the nipple itself and was advised to contact her dermatologist secondary to current  treatment not working to help itching.   Personal Factors and Comorbidities Comorbidity 3+    Comorbidities left breast CA with SLNB, prior endometrial CA, Left TSA, Back spasms    Stability/Clinical Decision Making Stable/Uncomplicated    Rehab Potential Excellent    PT Frequency 2x / week    PT Duration 4 weeks    PT Treatment/Interventions ADLs/Self Care Home Management;Therapeutic activities;Therapeutic exercise;Neuromuscular re-education;Manual techniques;Patient/family education;Orthotic Fit/Training;Manual lymph drainage;Passive range of motion;Scar mobilization    PT Next Visit Plan MFR to left areas of cording, PROM, MLD and instruct pt, (pt will star using chip pack at lupectomy incision to decrease fibrosis    PT Home Exercise Plan 4 post op exs, adding wrist extension to qi qong movements, supine scap series except diagonals, standing scap retraction and shoulder ext with yellow TB    Consulted and Agree with Plan of Care Patient           Patient will benefit from skilled therapeutic intervention in order to improve the following deficits and impairments:  Decreased mobility,Decreased strength,Increased edema,Impaired sensation,Postural dysfunction,Decreased scar mobility,Pain,Decreased skin integrity,Decreased knowledge of precautions,Decreased activity tolerance,Impaired UE functional use,Decreased range of motion  Visit Diagnosis: Abnormal posture  Localized edema  Aftercare following surgery for neoplasm  Left shoulder pain, unspecified chronicity  Malignant neoplasm of upper-outer quadrant of left breast in female, estrogen receptor positive ([place])     Problem List Patient Active Problem List   Diagnosis Date Noted  . Genetic testing 12/18/2020  . Family history of prostate cancer   . Family history of breast cancer   . Family history of kidney cancer   . Malignant neoplasm of upper-outer quadrant of left breast in female, estrogen receptor positive ([place])  09/21/2020  . Prediabetes 07/02/2019  . 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 Endometrial cancer ([place]) 09/26/2015  . Essential hypertension 05/23/2015  . Thoracic facet joint syndrome 04/27/2014  . Back pain 03/24/2014  . Muscle spasm 03/24/2014  . Scoliosis (and kyphoscoliosis), idiopathic 12/22/2012  . Osteopenia 12/22/2012  . Other and unspecified hyperlipidemia 12/22/2012    Dawn Powers 02/21/2021, 10:16 AM  [place], [place], [address] Phone: [phone]   Fax:  [phone]  Name: Dawn Powers MRN: [id number] Date of Birth: Sep 26, 1946 Cheral Almas, PT 02/21/21 10:17 AM

## 2021-02-22 ENCOUNTER — Encounter: Payer: Medicare Other | Admitting: Rehabilitation

## 2021-02-22 DIAGNOSIS — G8929 Other chronic pain: Secondary | ICD-10-CM | POA: Diagnosis not present

## 2021-02-22 DIAGNOSIS — M5459 Other low back pain: Secondary | ICD-10-CM | POA: Diagnosis not present

## 2021-02-27 ENCOUNTER — Telehealth: Payer: Self-pay | Admitting: *Deleted

## 2021-02-27 NOTE — Telephone Encounter (Signed)
This RN returned call per pt's VM inquiring about visual changes and needed cataract surgery as well concern if related to anastrazole use. She states she would also need to " get permission from Dr Jana Hakim for Dr Katy Fitch to proceed with surgery."  Obtained identified VM- message left to call this RN again.

## 2021-02-28 ENCOUNTER — Other Ambulatory Visit: Payer: Self-pay

## 2021-02-28 ENCOUNTER — Ambulatory Visit: Payer: Medicare Other | Attending: Oncology

## 2021-02-28 DIAGNOSIS — M25512 Pain in left shoulder: Secondary | ICD-10-CM

## 2021-02-28 DIAGNOSIS — C50412 Malignant neoplasm of upper-outer quadrant of left female breast: Secondary | ICD-10-CM | POA: Insufficient documentation

## 2021-02-28 DIAGNOSIS — Z170 Estrogen receptor positive status [ER+]: Secondary | ICD-10-CM | POA: Diagnosis not present

## 2021-02-28 DIAGNOSIS — R293 Abnormal posture: Secondary | ICD-10-CM | POA: Diagnosis not present

## 2021-02-28 DIAGNOSIS — R600 Localized edema: Secondary | ICD-10-CM

## 2021-02-28 DIAGNOSIS — Z483 Aftercare following surgery for neoplasm: Secondary | ICD-10-CM | POA: Insufficient documentation

## 2021-02-28 NOTE — Therapy (Signed)
[place], [place], [address] Phone: [phone]   Fax:  [phone]  Physical Therapy Treatment  Patient Details  Name: Dawn Powers MRN: [id number] Date of Birth: May 12, 1946 Referring Provider (PT): Dr. Aleda Grana   Encounter Date: 02/28/2021   PT End of Session - 02/28/21 0848    Visit Number 19    Number of Visits 24    Date for PT Re-Evaluation 03/08/21    Authorization Type MC    Authorization - Number of Visits 20    PT Start Time 0804    PT Stop Time 9476    PT Time Calculation (min) 51 min    Activity Tolerance Patient tolerated treatment well    Behavior During Therapy [facility] for tasks assessed/performed           Past Medical History:  Diagnosis Date  . Anxiety   . Arthritis   . Breast cancer ([place]) 09/19/2020   left breast IMC  . Cancer [facility])    Endometrial   . Family history of breast cancer   . Family history of kidney cancer   . Family history of prostate cancer   . Heart murmur   . Hyperlipidemia   . Hypertension   . Neuromuscular disorder ([place])    DDD  . Pain    lower back -hx "DDD"  . Radiation 11/15/15, 11/23/15, 11/30/15, 12/07/15, 12/14/15   HDR brachytherapy    Past Surgical History:  Procedure Laterality Date  . ABDOMINAL HYSTERECTOMY    . BREAST LUMPECTOMY WITH RADIOACTIVE SEED AND SENTINEL LYMPH NODE BIOPSY Left 10/19/2020   Procedure: LEFT BREAST LUMPECTOMY WITH RADIOACTIVE SEED AND SENTINEL LYMPH NODE BIOPSY;  Surgeon: Stark Klein, MD;  Location: [facility];  Service: General;  Laterality: Left;  [place], RNFA  . COLONOSCOPY    . FRACTURE SURGERY Right    ankle  . GANGLION CYST EXCISION    . LYMPH NODE BIOPSY N/A 09/26/2015   Procedure: SENTINEL LYMPH NODE BIOPSY;  Surgeon: Everitt Amber, MD;  [facility];  Service: Gynecology;  Laterality: N/A;  . ROBOTIC ASSISTED TOTAL HYSTERECTOMY WITH BILATERAL SALPINGO OOPHERECTOMY Bilateral  09/26/2015   Procedure: ROBOTIC ASSISTED TOTAL HYSTERECTOMY WITH BILATERAL SALPINGO OOPHORECTOMY;  Surgeon: Everitt Amber, MD;  [facility];  Service: Gynecology;  Laterality: Bilateral;  . TOTAL SHOULDER ARTHROPLASTY Left 09/10/2018   Procedure: TOTAL SHOULDER ARTHROPLASTY;  Surgeon: Justice Britain, MD;  Location: [facility];  Service: Orthopedics;  Laterality: Left;  124min  . WISDOM TOOTH EXTRACTION      There were no vitals filed for this visit.   Subjective Assessment - 02/28/21 0803    Subjective The nipple is not as itchy  now.  I didn't talk to the dermatologist because it has been improving.  Cording is still there and feels like a rubber band through the medial arm. Pt has been doing MLD every few days. Went to accupuncture for my back and I had spasms the whole day, but it is better today.    Pertinent History Pt. presents to PT s/p Left Breast Lumpectomy with SLNB on 10/19/2020.  She was diagnosed during a routine mammogram.  She has a history of Edometrial CA. with hysterectomy in 2016 and radiation in 2017. She had 3 LN removed.  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  She has not had pathology report yet.  She has a left TSA    Currently in Pain?  Yes    Pain Score 3     Pain Location Back    Pain Orientation Right;Left    Pain Descriptors / Indicators Aching;Sharp    Pain Onset More than a month ago    Pain Frequency Intermittent                             [facility] Adult PT Treatment/Exercise - 02/28/21 0001      Manual Therapy   Soft tissue mobilization lateral breast, scar tissue work included, pectoralis, latissimus    Myofascial Release MFR to areas of cording left axilla, lateral trunk,    Manual Lymphatic Drainage (MLD) short neck, bilateral axillary LN's, anterior interaxillary pathway,left axilloinguinal pathway and left breast retracing all pathways.  Pt reviewed same with head of table elevatedinstructed in same.    Passive ROM  PROM to the left shoulder into flexion, abd, ER, D2 flexion                    PT Short Term Goals - 12/14/20 1201      PT SHORT TERM GOAL #1   Title Pt will be independent with HEP for left shoulder ROM/ strength    Time 4    Period Weeks    Status Achieved             PT Long Term Goals - 02/28/21 3818      PT LONG TERM GOAL #1   Title Pt will be independent in self Manual Lymph drainage to decrease swelling    Time 4    Period Weeks    Status Achieved    Target Date 03/08/21      PT LONG TERM GOAL #2   Title Pt will be fit for compression bra/sleeve prn    Baseline has bra, does not want sleeve    Time 4    Period Weeks    Status Achieved    Target Date 03/08/21      PT LONG TERM GOAL #3   Title Left shoulder AROM returned to normal pre surgery ROM per pt. (has left TSA)    Baseline now decreased secondary to cording/radiation    Time 4    Period Weeks    Status On-going    Target Date 03/08/21      PT LONG TERM GOAL #4   Title Left chest/trunk swelling will be reduced greater than 50% per pt and will have no discomfort    Baseline slightly more swollen secondary to radiation    Time 4    Period Weeks    Status On-going    Target Date 03/08/21      PT LONG TERM GOAL #5   Title Pt will have no left shouder limitations due to cording    Time 4    Period Weeks    Status New    Target Date 03/08/21                 Plan - 02/28/21 0853    Clinical Impression Statement Pt was able to return demonstrate self breast MLD with good technique and understanding of sequence.  She does still have lateral breast swelling around area of incision which does improve with MLD.  Cording is still present and discomfort is prevalent during MFR to cords.  Less itching at nipple overall and decreased redness.  She is compliant with HEP, stretching and strengthening but continues to  be bothered with cording    Personal Factors and Comorbidities Comorbidity 3+     Comorbidities left breast CA with SLNB, prior endometrial CA, Left TSA, Back spasms    Stability/Clinical Decision Making Stable/Uncomplicated    Rehab Potential Excellent    PT Frequency 2x / week    PT Duration 4 weeks    PT Treatment/Interventions ADLs/Self Care Home Management;Therapeutic activities;Therapeutic exercise;Neuromuscular re-education;Manual techniques;Patient/family education;Orthotic Fit/Training;Manual lymph drainage;Passive range of motion;Scar mobilization    PT Next Visit Plan Recert?MFR to left areas of cording, PROM, MLD and instruct pt, (pt will star using chip pack at lupectomy incision to decrease fibrosis    PT Home Exercise Plan 4 post op exs, adding wrist extension to qi qong movements, supine scap series except diagonals, standing scap retraction and shoulder ext with yellow TB    Consulted and Agree with Plan of Care Patient           Patient will benefit from skilled therapeutic intervention in order to improve the following deficits and impairments:  Decreased mobility,Decreased strength,Increased edema,Impaired sensation,Postural dysfunction,Decreased scar mobility,Pain,Decreased skin integrity,Decreased knowledge of precautions,Decreased activity tolerance,Impaired UE functional use,Decreased range of motion  Visit Diagnosis: Abnormal posture  Localized edema  Aftercare following surgery for neoplasm  Left shoulder pain, unspecified chronicity  Malignant neoplasm of upper-outer quadrant of left breast in female, estrogen receptor positive ([place])     Problem List Patient Active Problem List   Diagnosis Date Noted  . Genetic testing 12/18/2020  . Family history of prostate cancer   . Family history of breast cancer   . Family history of kidney cancer   . Malignant neoplasm of upper-outer quadrant of left breast in female, estrogen receptor positive ([place]) 09/21/2020  . Prediabetes 07/02/2019  . 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  Endometrial cancer ([place]) 09/26/2015  . Essential hypertension 05/23/2015  . Thoracic facet joint syndrome 04/27/2014  . Back pain 03/24/2014  . Muscle spasm 03/24/2014  . Scoliosis (and kyphoscoliosis), idiopathic 12/22/2012  . Osteopenia 12/22/2012  . Other and unspecified hyperlipidemia 12/22/2012    Claris Pong 02/28/2021, 9:03 AM  [place], [place], [address] Phone: [phone]   Fax:  [phone]  Name: Dawn Powers MRN: [id number] Date of Birth: 1946-02-03 Cheral Almas, PT 02/28/21 9:04 AM

## 2021-03-05 DIAGNOSIS — H04123 Dry eye syndrome of bilateral lacrimal glands: Secondary | ICD-10-CM | POA: Diagnosis not present

## 2021-03-05 DIAGNOSIS — H2513 Age-related nuclear cataract, bilateral: Secondary | ICD-10-CM | POA: Diagnosis not present

## 2021-03-05 DIAGNOSIS — H52213 Irregular astigmatism, bilateral: Secondary | ICD-10-CM | POA: Diagnosis not present

## 2021-03-05 DIAGNOSIS — H0102A Squamous blepharitis right eye, upper and lower eyelids: Secondary | ICD-10-CM | POA: Diagnosis not present

## 2021-03-05 DIAGNOSIS — H10413 Chronic giant papillary conjunctivitis, bilateral: Secondary | ICD-10-CM | POA: Diagnosis not present

## 2021-03-05 DIAGNOSIS — H18453 Nodular corneal degeneration, bilateral: Secondary | ICD-10-CM | POA: Diagnosis not present

## 2021-03-05 DIAGNOSIS — H0102B Squamous blepharitis left eye, upper and lower eyelids: Secondary | ICD-10-CM | POA: Diagnosis not present

## 2021-03-07 NOTE — Progress Notes (Signed)
  Patient Name: Dawn Powers MRN: [id number] DOB: 26-May-1946 Referring Physician: Lurline Del (Profile Not Attached) Date of Service: 01/10/2021 [facility], [place]                                                        End Of Treatment Note  Diagnoses: C50.412-Malignant neoplasm of upper-outer quadrant of left female breast  Cancer Staging: Cancer Staging Endometrial cancer [facility]) Staging form: Corpus Uteri - Carcinoma, AJCC 7th Edition - Clinical: FIGO Stage IB (T1b, N0, M0) - Signed by Chauncey Cruel, MD on 09/21/2020 Histologic grade (G): G2  Malignant neoplasm of upper-outer quadrant of left breast in female, estrogen receptor positive ([place]) Staging form: Breast, AJCC 8th Edition - Clinical: Stage IA (cT1c, cN0, cM0, G3, ER+, PR+, HER2: Equivocal) - Signed by Chauncey Cruel, MD on 09/21/2020  pT1c pN0   Intent: Curative  Radiation Treatment Dates: 12/20/2020 through 01/10/2021 Site Technique Total Dose (Gy) Dose per Fx (Gy) Completed Fx Beam Energies  Breast, Left: Breast_Lt 3D 42.56/42.56 2.66 16/16 6X   Narrative: The patient tolerated radiation therapy relatively well.   Plan: The patient will follow-up with radiation oncology in 71mo.     Eppie Gibson, MD

## 2021-03-08 ENCOUNTER — Encounter: Payer: Medicare Other | Admitting: Rehabilitation

## 2021-03-13 ENCOUNTER — Ambulatory Visit: Payer: Medicare Other | Admitting: Rehabilitation

## 2021-03-15 ENCOUNTER — Encounter: Payer: Self-pay | Admitting: Rehabilitation

## 2021-03-15 ENCOUNTER — Other Ambulatory Visit: Payer: Self-pay

## 2021-03-15 ENCOUNTER — Ambulatory Visit: Payer: Medicare Other | Admitting: Rehabilitation

## 2021-03-15 DIAGNOSIS — M25512 Pain in left shoulder: Secondary | ICD-10-CM

## 2021-03-15 DIAGNOSIS — Z170 Estrogen receptor positive status [ER+]: Secondary | ICD-10-CM

## 2021-03-15 DIAGNOSIS — R293 Abnormal posture: Secondary | ICD-10-CM | POA: Diagnosis not present

## 2021-03-15 DIAGNOSIS — R600 Localized edema: Secondary | ICD-10-CM | POA: Diagnosis not present

## 2021-03-15 DIAGNOSIS — C50412 Malignant neoplasm of upper-outer quadrant of left female breast: Secondary | ICD-10-CM | POA: Diagnosis not present

## 2021-03-15 DIAGNOSIS — Z483 Aftercare following surgery for neoplasm: Secondary | ICD-10-CM | POA: Diagnosis not present

## 2021-03-15 NOTE — Therapy (Signed)
[place], [place], [address] Phone: [phone]   Fax:  [phone]   Physical Therapy Progress Note  Dates of Reporting Period: 12/07/20  to 03/15/21  Objective Reports of Subjective Statement: see below  Objective Measurements: see below  Goal Update: see below  Plan: below  Reason Skilled Services are Required: below  Physical Therapy Treatment  Patient Details  Name: Dawn Powers MRN: [id number] Date of Birth: [birth year] Referring Provider (PT): Dr. Aleda Grana   Encounter Date: 03/15/2021   PT End of Session - 03/15/21 1100     Visit Number 20    Number of Visits 26    Date for PT Re-Evaluation 04/26/21    Authorization - Number of Visits 30    PT Start Time 1006    PT Stop Time 5102    PT Time Calculation (min) 52 min    Activity Tolerance Patient tolerated treatment well    Behavior During Therapy [facility] for tasks assessed/performed             Past Medical History:  Diagnosis Date   Anxiety    Arthritis    Breast cancer ([place]) 09/19/2020   left breast IMC   Cancer ([place])    Endometrial    Family history of breast cancer    Family history of kidney cancer    Family history of prostate cancer    Heart murmur    Hyperlipidemia    Hypertension    Neuromuscular disorder ([place])    DDD   Pain    lower back -hx "DDD"   Radiation 11/15/15, 11/23/15, 11/30/15, 12/07/15, 12/14/15   HDR brachytherapy    Past Surgical History:  Procedure Laterality Date   ABDOMINAL HYSTERECTOMY     BREAST LUMPECTOMY WITH RADIOACTIVE SEED AND SENTINEL LYMPH NODE BIOPSY Left 10/19/2020   Procedure: LEFT BREAST LUMPECTOMY WITH RADIOACTIVE SEED AND SENTINEL LYMPH NODE BIOPSY;  Surgeon: Stark Klein, MD;  Location: [place];  Service: General;  Laterality: Left;  PEC BLOCK, RNFA   COLONOSCOPY     FRACTURE SURGERY Right    ankle   GANGLION CYST EXCISION     LYMPH NODE BIOPSY N/A  09/26/2015   Procedure: SENTINEL LYMPH NODE BIOPSY;  Surgeon: Everitt Amber, MD;  [facility];  Service: Gynecology;  Laterality: N/A;   ROBOTIC ASSISTED TOTAL HYSTERECTOMY WITH BILATERAL SALPINGO OOPHERECTOMY Bilateral 09/26/2015   Procedure: ROBOTIC ASSISTED TOTAL HYSTERECTOMY WITH BILATERAL SALPINGO OOPHORECTOMY;  Surgeon: Everitt Amber, MD;  [facility];  Service: Gynecology;  Laterality: Bilateral;   TOTAL SHOULDER ARTHROPLASTY Left 09/10/2018   Procedure: TOTAL SHOULDER ARTHROPLASTY;  Surgeon: Justice Britain, MD;  Location: [place];  Service: Orthopedics;  Laterality: Left;  168min   WISDOM TOOTH EXTRACTION      There were no vitals filed for this visit.   Subjective Assessment - 03/15/21 1007     Subjective It has seemed to plateau.  It feels like a rubber balloon in the armpit and still feels tight.    Pertinent History Pt. presents to PT s/p Left Breast Lumpectomy with SLNB on 10/19/2020.  She was diagnosed during a routine mammogram.  She has a history of Edometrial CA. with hysterectomy in 2016 and radiation in 2017. She had 3 LN removed.  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  She has not had pathology report yet.  She has a left TSA    Patient Stated Goals check  breast for swelling, axilla and arm for tightness    Currently in Pain? No/denies   only when touching               [facility] PT Assessment - 03/15/21 0001       ROM / Strength   AROM / PROM / Strength Strength      Strength   Strength Assessment Site Shoulder    Right/Left Shoulder Left;Right    Right Shoulder Flexion 4/5    Right Shoulder Internal Rotation 4+/5    Right Shoulder External Rotation 4+/5    Left Shoulder Flexion 4-/5    Left Shoulder Internal Rotation 4/5    Left Shoulder External Rotation 4-/5                           [facility] Adult PT Treatment/Exercise - 03/15/21 0001       Manual Therapy   Manual therapy comments goals assessed including MMT     Soft tissue mobilization lateral breast, scar tissue work included, pectoralis, latissimus    Myofascial Release MFR to areas of cording left axilla, lateral trunk,    Manual Lymphatic Drainage (MLD) short neck, bilateral axillary LN's, anterior interaxillary pathway,left axilloinguinal pathway and left breast retracing all pathways.                      PT Short Term Goals - 12/14/20 1201       PT SHORT TERM GOAL #1   Title Pt will be independent with HEP for left shoulder ROM/ strength    Time 4    Period Weeks    Status Achieved               PT Long Term Goals - 03/15/21 1013       PT LONG TERM GOAL #1   Title Pt will be independent in self Manual Lymph drainage to decrease swelling    Status Achieved      PT LONG TERM GOAL #2   Title Pt will be fit for compression bra/sleeve prn    Status Achieved      PT LONG TERM GOAL #3   Title Left shoulder AROM returned to normal pre surgery ROM per pt. (has left TSA)    Status Achieved      PT LONG TERM GOAL #4   Title Left chest/trunk swelling will be reduced greater than 50% per pt and will have no discomfort    Status Achieved      PT LONG TERM GOAL #5   Title Pt will have no left shouder limitations due to cording    Baseline no limitations but still noticing it    Status Partially Met                   Plan - 03/15/21 1102     Clinical Impression Statement Reassessed pts goals today for recertification.  Pt still has cording in the Lt UE, increased scar tissue and edema from chronic seroma and surgical resection which softens and stretches with MT.  Pt is feeling close to plateau with status in the Lt UE but would still benefit from PT at this time to decrease breast edema and see if scar tissue continues to stay less tight with more sessions.  will extend 1x per week up to 6 weeks. pt would also like to transition to more strength    PT Frequency 1x /  week    PT Duration 6 weeks    PT  Treatment/Interventions ADLs/Self Care Home Management;Therapeutic activities;Therapeutic exercise;Neuromuscular re-education;Manual techniques;Patient/family education;Orthotic Fit/Training;Manual lymph drainage;Passive range of motion;Scar mobilization    PT Next Visit Plan MFR to left areas of cording and scar, breast MLD, start some general shoulder strength like supine scap or 4 way shoulder    Consulted and Agree with Plan of Care Patient             Patient will benefit from skilled therapeutic intervention in order to improve the following deficits and impairments:  Decreased mobility, Decreased strength, Increased edema, Impaired sensation, Postural dysfunction, Decreased scar mobility, Pain, Decreased skin integrity, Decreased knowledge of precautions, Decreased activity tolerance, Impaired UE functional use, Decreased range of motion  Visit Diagnosis: Abnormal posture  Aftercare following surgery for neoplasm  Localized edema  Left shoulder pain, unspecified chronicity  Malignant neoplasm of upper-outer quadrant of left breast in female, estrogen receptor positive ([place])     Problem List Patient Active Problem List   Diagnosis Date Noted   Genetic testing 12/18/2020   Family history of prostate cancer    Family history of breast cancer    Family history of kidney cancer    Malignant neoplasm of upper-outer quadrant of left breast in female, estrogen receptor positive ([place]) 09/21/2020   Prediabetes 07/02/2019   S/P shoulder replacement, 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   Endometrial cancer ([place]) 09/26/2015   Essential hypertension 05/23/2015   Thoracic facet joint syndrome 04/27/2014   Back pain 03/24/2014   Muscle spasm 03/24/2014   Scoliosis (and kyphoscoliosis), idiopathic 12/22/2012   Osteopenia 12/22/2012   Other and unspecified hyperlipidemia 12/22/2012    Stark Bray 03/15/2021, 11:06 AM  [place], [place], [address] Phone: [phone]   Fax:  [phone]  Name: MAZIKEEN HEHN MRN: [id number] Date of Birth: 08-18-1946

## 2021-03-20 ENCOUNTER — Encounter: Payer: Medicare Other | Admitting: Rehabilitation

## 2021-03-20 ENCOUNTER — Other Ambulatory Visit: Payer: Self-pay | Admitting: Family Medicine

## 2021-03-20 DIAGNOSIS — G894 Chronic pain syndrome: Secondary | ICD-10-CM

## 2021-03-27 ENCOUNTER — Telehealth: Payer: Self-pay | Admitting: *Deleted

## 2021-03-27 ENCOUNTER — Ambulatory Visit: Payer: Medicare Other | Admitting: Rehabilitation

## 2021-03-27 NOTE — Telephone Encounter (Signed)
Pt left VM stating request to reschedule current appointment in August to sooner due to " having some swelling and want to be seen sooner "  This RN returned call - obtained identified VM-message left requesting a return call to discuss further for appropriate rescheduling.  Of note pt is being seen at the lymphedema clinic presently.  This RN name given for return call.

## 2021-03-28 ENCOUNTER — Telehealth: Payer: Self-pay | Admitting: *Deleted

## 2021-03-28 NOTE — Telephone Encounter (Signed)
This RN returned second message left by the patient requesting appointment change due to "swelling".

## 2021-03-29 ENCOUNTER — Encounter: Payer: Medicare Other | Admitting: Rehabilitation

## 2021-04-04 ENCOUNTER — Other Ambulatory Visit: Payer: Self-pay

## 2021-04-04 ENCOUNTER — Ambulatory Visit: Payer: Medicare Other | Attending: Oncology

## 2021-04-04 ENCOUNTER — Other Ambulatory Visit: Payer: Self-pay | Admitting: Oncology

## 2021-04-04 ENCOUNTER — Encounter: Payer: Self-pay | Admitting: Oncology

## 2021-04-04 DIAGNOSIS — R293 Abnormal posture: Secondary | ICD-10-CM

## 2021-04-04 DIAGNOSIS — M47894 Other spondylosis, thoracic region: Secondary | ICD-10-CM

## 2021-04-04 DIAGNOSIS — M25512 Pain in left shoulder: Secondary | ICD-10-CM | POA: Insufficient documentation

## 2021-04-04 DIAGNOSIS — C541 Malignant neoplasm of endometrium: Secondary | ICD-10-CM

## 2021-04-04 DIAGNOSIS — Z170 Estrogen receptor positive status [ER+]: Secondary | ICD-10-CM | POA: Diagnosis not present

## 2021-04-04 DIAGNOSIS — N63 Unspecified lump in unspecified breast: Secondary | ICD-10-CM

## 2021-04-04 DIAGNOSIS — C50412 Malignant neoplasm of upper-outer quadrant of left female breast: Secondary | ICD-10-CM

## 2021-04-04 DIAGNOSIS — Z483 Aftercare following surgery for neoplasm: Secondary | ICD-10-CM | POA: Diagnosis not present

## 2021-04-04 DIAGNOSIS — R600 Localized edema: Secondary | ICD-10-CM | POA: Insufficient documentation

## 2021-04-04 NOTE — Patient Instructions (Signed)

## 2021-04-04 NOTE — Therapy (Signed)
[place], [place], [address] Phone: [phone]   Fax:  [phone]  Physical Therapy Treatment  Patient Details  Name: Dawn Powers MRN: [id number] Date of Birth: [birth year] Referring Provider (PT): Dr. Aleda Grana   Encounter Date: 04/04/2021   PT End of Session - 04/04/21 1051     Visit Number 21    Number of Visits 26    Date for PT Re-Evaluation 04/26/21    Authorization Type MC    Authorization - Number of Visits 30    PT Start Time 1007    PT Stop Time 1059    PT Time Calculation (min) 52 min    Activity Tolerance Patient tolerated treatment well    Behavior During Therapy [facility] for tasks assessed/performed             Past Medical History:  Diagnosis Date   Anxiety    Arthritis    Breast cancer ([place]) 09/19/2020   left breast IMC   Cancer ([place])    Endometrial    Family history of breast cancer    Family history of kidney cancer    Family history of prostate cancer    Heart murmur    Hyperlipidemia    Hypertension    Neuromuscular disorder ([place])    DDD   Pain    lower back -hx "DDD"   Radiation 11/15/15, 11/23/15, 11/30/15, 12/07/15, 12/14/15   HDR brachytherapy    Past Surgical History:  Procedure Laterality Date   ABDOMINAL HYSTERECTOMY     BREAST LUMPECTOMY WITH RADIOACTIVE SEED AND SENTINEL LYMPH NODE BIOPSY Left 10/19/2020   Procedure: LEFT BREAST LUMPECTOMY WITH RADIOACTIVE SEED AND SENTINEL LYMPH NODE BIOPSY;  Surgeon: Stark Klein, MD;  Location: [place];  Service: General;  Laterality: Left;  PEC BLOCK, RNFA   COLONOSCOPY     FRACTURE SURGERY Right    ankle   GANGLION CYST EXCISION     LYMPH NODE BIOPSY N/A 09/26/2015   Procedure: SENTINEL LYMPH NODE BIOPSY;  Surgeon: Everitt Amber, MD;  [facility];  Service: Gynecology;  Laterality: N/A;   ROBOTIC ASSISTED TOTAL HYSTERECTOMY WITH BILATERAL SALPINGO OOPHERECTOMY Bilateral 09/26/2015    Procedure: ROBOTIC ASSISTED TOTAL HYSTERECTOMY WITH BILATERAL SALPINGO OOPHORECTOMY;  Surgeon: Everitt Amber, MD;  [facility];  Service: Gynecology;  Laterality: Bilateral;   TOTAL SHOULDER ARTHROPLASTY Left 09/10/2018   Procedure: TOTAL SHOULDER ARTHROPLASTY;  Surgeon: Justice Britain, MD;  Location: [place];  Service: Orthopedics;  Laterality: Left;  150min   WISDOM TOOTH EXTRACTION      There were no vitals filed for this visit.   Subjective Assessment - 04/04/21 1007     Subjective Just returned from a week long music camp.  It went very well.  I was really nervous.  I thought it was warm and swollen.  I never heard from the MD office.  I did my MLD 2xs per day and it worked. the swelling and redness went down but the left breast still feels warm.    Pertinent History Pt. presents to PT s/p Left Breast Lumpectomy with SLNB on 10/19/2020.  She was diagnosed during a routine mammogram.  She has a history of Edometrial CA. with hysterectomy in 2016 and radiation in 2017. She had 3 LN removed.  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  She has not had pathology report yet.  She has a left TSA  [facility] Adult PT Treatment/Exercise - 04/04/21 0001       Shoulder Exercises: Supine   Horizontal ABduction Strengthening;Both;10 reps    Theraband Level (Shoulder Horizontal ABduction) Level 1 (Yellow)    External Rotation Strengthening;Both;10 reps    Theraband Level (Shoulder External Rotation) Level 1 (Yellow)    Flexion Strengthening;Both;10 reps    Theraband Level (Shoulder Flexion) Level 1 (Yellow)    Diagonals Strengthening;Both;10 reps    Theraband Level (Shoulder Diagonals) Level 1 (Yellow)      Manual Therapy   Edema Management reviewed pt self MLD to correct hand placement for pathways, LN activation    Soft tissue mobilization lateral breast, scar    Myofascial Release MFR to areas of cording left axilla, lateral  trunk,    Manual Lymphatic Drainage (MLD) short neck, bilateral axillary LN's, anterior interaxillary pathway,left axilloinguinal pathway and left breast retracing all pathways.    Passive ROM PROM to the left shoulder into flexion, abd, ER, D2 flexion                    PT Education - 04/04/21 1101     Education Details Pt educated again in all supine stabs and practiced each x 10    Person(s) Educated Patient    Methods Explanation;Handout    Comprehension Returned demonstration              PT Short Term Goals - 12/14/20 1201       PT SHORT TERM GOAL #1   Title Pt will be independent with HEP for left shoulder ROM/ strength    Time 4    Period Weeks    Status Achieved               PT Long Term Goals - 03/15/21 1013       PT LONG TERM GOAL #1   Title Pt will be independent in self Manual Lymph drainage to decrease swelling    Status Achieved      PT LONG TERM GOAL #2   Title Pt will be fit for compression bra/sleeve prn    Status Achieved      PT LONG TERM GOAL #3   Title Left shoulder AROM returned to normal pre surgery ROM per pt. (has left TSA)    Status Achieved      PT LONG TERM GOAL #4   Title Left chest/trunk swelling will be reduced greater than 50% per pt and will have no discomfort    Status Achieved      PT LONG TERM GOAL #5   Title Pt will have no left shouder limitations due to cording    Baseline no limitations but still noticing it    Status Partially Met                   Plan - 04/04/21 1134     Clinical Impression Statement therapy consisted of pt instruction/review of MLD to left breast, MFR techniques to left axillary cording, PT performance of MLD to left breast, education to pt and PT performance of scar massage, and pt review and instruction in supine scapular series with yellow band. Pt needed some reminding for hand placement with performing pathways during MLD and proper LN activation.  She felt tightness  during MFR to cording, however, cording does not impact her ROM which is excellent.  She did well last week while away and learned that her compliance with MLD works and is important for keeping  swelling down. She will start back to her theraband exs    Personal Factors and Comorbidities Comorbidity 3+    Comorbidities left breast CA with SLNB, prior endometrial CA, Left TSA, Back spasms    Stability/Clinical Decision Making Stable/Uncomplicated    Rehab Potential Excellent    PT Frequency 1x / week    PT Duration 6 weeks    PT Treatment/Interventions ADLs/Self Care Home Management;Therapeutic activities;Therapeutic exercise;Neuromuscular re-education;Manual techniques;Patient/family education;Orthotic Fit/Training;Manual lymph drainage;Passive range of motion;Scar mobilization    PT Next Visit Plan MFR to left areas of cording and scar, breast MLD, start some general shoulder strength like supine scap or 4 way shoulder, chip pack?    PT Home Exercise Plan 4 post op exs, adding wrist extension to qi qong movements, supine scap series except diagonals, standing scap retraction and shoulder ext with yellow TB    Consulted and Agree with Plan of Care Patient             Patient will benefit from skilled therapeutic intervention in order to improve the following deficits and impairments:  Decreased mobility, Decreased strength, Increased edema, Impaired sensation, Postural dysfunction, Decreased scar mobility, Pain, Decreased skin integrity, Decreased knowledge of precautions, Decreased activity tolerance, Impaired UE functional use, Decreased range of motion  Visit Diagnosis: Abnormal posture  Aftercare following surgery for neoplasm  Localized edema  Left shoulder pain, unspecified chronicity  Malignant neoplasm of upper-outer quadrant of left breast in female, estrogen receptor positive ([place])     Problem List Patient Active Problem List   Diagnosis Date Noted   Genetic testing  12/18/2020   Family history of prostate cancer    Family history of breast cancer    Family history of kidney cancer    Malignant neoplasm of upper-outer quadrant of left breast in female, estrogen receptor positive ([place]) 09/21/2020   Prediabetes 07/02/2019   S/P shoulder replacement, 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   Endometrial cancer ([place]) 09/26/2015   Essential hypertension 05/23/2015   Thoracic facet joint syndrome 04/27/2014   Back pain 03/24/2014   Muscle spasm 03/24/2014   Scoliosis (and kyphoscoliosis), idiopathic 12/22/2012   Osteopenia 12/22/2012   Other and unspecified hyperlipidemia 12/22/2012    Elsie Ra Auburn Hester 04/04/2021, 12:06 PM  [place], [place], [address] Phone: [phone]   Fax:  [phone]  Name: Dawn Powers MRN: [id number] Date of Birth: [birth year] Cheral Almas, PT 04/04/21 12:08 PM

## 2021-04-05 ENCOUNTER — Ambulatory Visit
Admission: RE | Admit: 2021-04-05 | Discharge: 2021-04-05 | Disposition: A | Discharge status: Home / Self Care | Payer: Medicare Other | Source: Ambulatory Visit | Attending: Oncology | Admitting: Oncology

## 2021-04-05 DIAGNOSIS — Z170 Estrogen receptor positive status [ER+]: Secondary | ICD-10-CM

## 2021-04-05 DIAGNOSIS — Z853 Personal history of malignant neoplasm of breast: Secondary | ICD-10-CM | POA: Diagnosis not present

## 2021-04-05 DIAGNOSIS — N63 Unspecified lump in unspecified breast: Secondary | ICD-10-CM

## 2021-04-05 DIAGNOSIS — C541 Malignant neoplasm of endometrium: Secondary | ICD-10-CM

## 2021-04-05 DIAGNOSIS — N644 Mastodynia: Secondary | ICD-10-CM | POA: Diagnosis not present

## 2021-04-05 DIAGNOSIS — M47894 Other spondylosis, thoracic region: Secondary | ICD-10-CM

## 2021-04-05 DIAGNOSIS — C50412 Malignant neoplasm of upper-outer quadrant of left female breast: Secondary | ICD-10-CM

## 2021-04-05 DIAGNOSIS — R922 Inconclusive mammogram: Secondary | ICD-10-CM | POA: Diagnosis not present

## 2021-04-05 HISTORY — DX: Personal history of irradiation: Z92.3

## 2021-04-11 ENCOUNTER — Other Ambulatory Visit: Payer: Self-pay

## 2021-04-11 ENCOUNTER — Encounter: Payer: Self-pay | Admitting: Gynecologic Oncology

## 2021-04-11 ENCOUNTER — Inpatient Hospital Stay: Payer: Medicare Other | Attending: Gynecologic Oncology | Admitting: Gynecologic Oncology

## 2021-04-11 VITALS — BP 136/71 | HR 63 | Temp 97.6°F | Resp 18 | Ht 65.0 in | Wt 120.0 lb

## 2021-04-11 DIAGNOSIS — G709 Myoneural disorder, unspecified: Secondary | ICD-10-CM | POA: Diagnosis not present

## 2021-04-11 DIAGNOSIS — Z79899 Other long term (current) drug therapy: Secondary | ICD-10-CM | POA: Insufficient documentation

## 2021-04-11 DIAGNOSIS — Z9221 Personal history of antineoplastic chemotherapy: Secondary | ICD-10-CM | POA: Diagnosis not present

## 2021-04-11 DIAGNOSIS — M549 Dorsalgia, unspecified: Secondary | ICD-10-CM | POA: Diagnosis not present

## 2021-04-11 DIAGNOSIS — C50919 Malignant neoplasm of unspecified site of unspecified female breast: Secondary | ICD-10-CM | POA: Insufficient documentation

## 2021-04-11 DIAGNOSIS — Z90710 Acquired absence of both cervix and uterus: Secondary | ICD-10-CM | POA: Diagnosis not present

## 2021-04-11 DIAGNOSIS — C541 Malignant neoplasm of endometrium: Secondary | ICD-10-CM

## 2021-04-11 DIAGNOSIS — Z8542 Personal history of malignant neoplasm of other parts of uterus: Secondary | ICD-10-CM | POA: Diagnosis not present

## 2021-04-11 DIAGNOSIS — E785 Hyperlipidemia, unspecified: Secondary | ICD-10-CM | POA: Diagnosis not present

## 2021-04-11 DIAGNOSIS — Z923 Personal history of irradiation: Secondary | ICD-10-CM | POA: Diagnosis not present

## 2021-04-11 DIAGNOSIS — Z79811 Long term (current) use of aromatase inhibitors: Secondary | ICD-10-CM | POA: Insufficient documentation

## 2021-04-11 DIAGNOSIS — R011 Cardiac murmur, unspecified: Secondary | ICD-10-CM | POA: Insufficient documentation

## 2021-04-11 DIAGNOSIS — I10 Essential (primary) hypertension: Secondary | ICD-10-CM | POA: Insufficient documentation

## 2021-04-11 DIAGNOSIS — G8929 Other chronic pain: Secondary | ICD-10-CM | POA: Diagnosis not present

## 2021-04-11 DIAGNOSIS — Z87891 Personal history of nicotine dependence: Secondary | ICD-10-CM | POA: Diagnosis not present

## 2021-04-11 DIAGNOSIS — Z90722 Acquired absence of ovaries, bilateral: Secondary | ICD-10-CM | POA: Diagnosis not present

## 2021-04-11 NOTE — Progress Notes (Signed)
Gynecologic Oncology Follow-up  Chief Complaint:  Chief Complaint  Patient presents with   Endometrial cancer ([place])   FOLLOW UP VISIT  Assessment:    75 y.o. 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   S/p robotic hysterectomy, BSO, sentinel lymph node biopsy on 09/26/15 with high intermediate risk factors for recurrence, s/p vaginal brachytherapy adjuvant therapy completed in March, 2017.  No evidence of recurrence on today's exam.  New diagnosis of breast cancer, stage I, receiving anastrazole.   Plan: Discussed symptoms concerning for recurrence and recommendation to return to see [place] on a prn basis should they develop.  She has graduated from scheduled cancer surveillance visits for her endometrial cancer as it will have been greater than 5 years since completing therapy.  HPI:  Dawn Powers is a 75 y.o. year old initially seen in consultation on 09/08/15 referred by Dr Meissinger for grade 2 endometrial cancer.  She then underwent a robotic hysterectomy, BSO and bilateral SLN biopsy on 66/29/47 without complications.  Her postoperative course was uncomplicated.  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 MSI stable. She was recommended adjuvant vaginal brachytherapy for high/intermediate risk factors.  She completed vaginal brachytherapy in March, 2017.  She had shoulder replacement surgery in December, 2019.   Interval Hx:  She has begun seeing acupuncturist Sigmund Hazel from [facility] and this has resulted in her being able to stop taking oxycodone for her chronic back pain.  She has had mild left lower quadrant pains intermittently that are not bothersome.   In December, 2021 she was diagnosed with breast cancer and was treated with surgery, radiation, and anastrazole.   Past Medical  History:  Diagnosis Date   Anxiety    Arthritis    Breast cancer ([facility]) 09/19/2020   left breast [facility] w/ radiation no chemo   Cancer ([place])    Endometrial    Family history of breast cancer    Family history of kidney cancer    Family history of prostate cancer    Heart murmur    Hyperlipidemia    Hypertension    Neuromuscular disorder ([place])    DDD   Pain    lower back -hx "DDD"   Personal history of radiation therapy    left [facility] 08/2020 w/ radiation no chemo   Radiation 11/15/15, 11/23/15, 11/30/15, 12/07/15, 12/14/15   HDR brachytherapy   Past Surgical History:  Procedure Laterality Date   ABDOMINAL HYSTERECTOMY     BREAST LUMPECTOMY Left 10/19/2018   left lumpectomy 10-19-20   BREAST LUMPECTOMY WITH RADIOACTIVE SEED AND SENTINEL LYMPH NODE BIOPSY Left 10/19/2020   Procedure: LEFT BREAST LUMPECTOMY WITH RADIOACTIVE SEED AND SENTINEL LYMPH NODE BIOPSY;  Surgeon: Stark Klein, MD;  Location: [place];  Service: General;  Laterality: Left;  PEC BLOCK, RNFA   COLONOSCOPY     FRACTURE SURGERY Right    ankle   GANGLION CYST EXCISION     LYMPH NODE BIOPSY N/A 09/26/2015   Procedure: SENTINEL LYMPH NODE BIOPSY;  Surgeon: Everitt Amber, MD;  Location: [facility];  Service: Gynecology;  Laterality: N/A;   ROBOTIC ASSISTED TOTAL HYSTERECTOMY WITH BILATERAL SALPINGO OOPHERECTOMY Bilateral 09/26/2015   Procedure: ROBOTIC ASSISTED TOTAL HYSTERECTOMY WITH BILATERAL SALPINGO OOPHORECTOMY;  Surgeon: Everitt Amber, MD;  [facility];  Service: Gynecology;  Laterality: Bilateral;   TOTAL  SHOULDER ARTHROPLASTY Left 09/10/2018   Procedure: TOTAL SHOULDER ARTHROPLASTY;  Surgeon: Justice Britain, MD;  Location: [place];  Service: Orthopedics;  Laterality: Left;  146mn   WISDOM TOOTH EXTRACTION     Family History  Problem Relation Age of Onset   Heart disease Mother    Alcohol abuse Mother        breast cancer - questionable    Dementia Mother    Alcohol abuse Father    Prostate cancer Father  632      prostate and colon- unsure of primary    Kidney cancer Brother 790  Alcohol abuse Maternal Grandfather    Alcohol abuse Sister    Basal cell carcinoma Sister    Breast cancer Cousin        double mastectomy, d. 519  Prostate cancer Brother 766  Esophageal cancer Neg Hx    Stomach cancer Neg Hx    Rectal cancer Neg Hx    Social History   Socioeconomic History   Marital status: Married    Spouse name: Not on file   Number of children: 2   Years of education: Not on file   Highest education level: Not on file  Occupational History   Not on file  Tobacco Use   Smoking status: Former    Years: 4.00    Pack years: 0.00    Types: Cigarettes    Quit date: 09/30/1998    Years since quitting: 22.5   Smokeless tobacco: Never  Vaping Use   Vaping Use: Never used  Substance and Sexual Activity   Alcohol use: Yes    Alcohol/week: 3.0 standard drinks    Types: 3 Standard drinks or equivalent per week    Comment: 5 drinks per week   Drug use: No   Sexual activity: Yes    Partners: Female    Birth control/protection: Surgical  Other Topics Concern   Not on file  Social History Narrative   Married. Education: college and Other. Exercise 4 times a week for 50 minutes.   Social Determinants of Health   Financial Resource Strain: Not on file  Food Insecurity: Not on file  Transportation Needs: Not on file  Physical Activity: Not on file  Stress: Not on file  Social Connections: Not on file  Intimate Partner Violence: Not on file   Current Outpatient Medications on File Prior to Visit  Medication Sig Dispense Refill   anastrozole (ARIMIDEX) 1 MG tablet Take 1 tablet (1 mg total) by mouth daily. 90 tablet 4   HYDROcodone-acetaminophen (NORCO/VICODIN) 5-325 MG tablet TAKE 1 OR 2 TABLETS EVERY 6 HOURS AS NEEDED FOR PAIN. 90 tablet 0   losartan (COZAAR) 50 MG tablet Take 1.5 tablets (75 mg total) by mouth daily. 135 tablet 3   traZODone (DESYREL) 50 MG tablet Take 1.5  tablets (75 mg total) by mouth at bedtime as needed for sleep. (Patient taking differently: Take 50 mg by mouth at bedtime as needed for sleep.) 135 tablet 1   No current facility-administered medications on file prior to visit.   Allergies  Allergen Reactions   Morphine And Related Hives      Review of systems: Constitutional:  She has no weight gain or weight loss. She has no fever or chills. Eyes: No blurred vision Ears, Nose, Mouth, Throat: No dizziness, headaches or changes in hearing. No mouth sores. Cardiovascular: No chest pain, palpitations or edema. Respiratory:  No shortness of breath, wheezing or cough Gastrointestinal:  She has normal bowel movements without diarrhea or constipation. She denies any nausea or vomiting. She denies blood in her stool or heart burn. Occasional pelvic twinges. Genitourinary:  She denies pelvic pain, pelvic pressure or changes in her urinary function. She has no hematuria, dysuria, or incontinence. She has no irregular vaginal bleeding or vaginal discharge Musculoskeletal: Denies muscle weakness or joint pains.  Skin:  She has no skin changes, rashes or itching Neurological: resolved neuropathic back pain. Psychiatric:  She denies depression or anxiety. Hematologic/Lymphatic:   No easy bruising or bleeding   Physical Exam: WD in NAD Neck  Supple NROM, without any enlargements.  Lymph Node Survey No cervical supraclavicular or inguinal adenopathy Cardiovascular  Well perfused peripheries Lungs  No increased WOB Skin  No rash/lesions/breakdown. Psychiatry  Alert and oriented to person, place, and time  Abdomen  Normoactive bowel sounds, abdomen soft, non-tender and thin without evidence of hernia.  Back No CVA tenderness Genito Urinary  Vaginal cuff smooth, no lesions. No palpable masses.  Rectal  Good tone, no masses no cul de sac nodularity. Tenderness on deep palpation but no masses.  Extremities  No bilateral cyanosis,  clubbing or edema.   Thereasa Solo, MD

## 2021-04-11 NOTE — Patient Instructions (Signed)
Dr Denman George is releasing you from scheduled surveillance visits for your endometrial cancer. However, if you develop new concerning pelvic symptoms, please contact her office at [phone] to be seen on an as needed basis.

## 2021-04-12 ENCOUNTER — Encounter: Payer: Self-pay | Admitting: Family Medicine

## 2021-04-12 ENCOUNTER — Other Ambulatory Visit (INDEPENDENT_AMBULATORY_CARE_PROVIDER_SITE_OTHER): Payer: Medicare Other

## 2021-04-12 DIAGNOSIS — E559 Vitamin D deficiency, unspecified: Secondary | ICD-10-CM | POA: Diagnosis not present

## 2021-04-12 DIAGNOSIS — E538 Deficiency of other specified B group vitamins: Secondary | ICD-10-CM

## 2021-04-12 LAB — VITAMIN B12: Vitamin B-12: >1550 pg/mL — ABNORMAL HIGH (ref 211–911)

## 2021-04-12 LAB — VITAMIN D 25 HYDROXY (VIT D DEFICIENCY, FRACTURES): VITD: 44.13 ng/mL (ref 30.00–100.00)

## 2021-04-18 DIAGNOSIS — M5459 Other low back pain: Secondary | ICD-10-CM | POA: Diagnosis not present

## 2021-04-18 DIAGNOSIS — G8929 Other chronic pain: Secondary | ICD-10-CM | POA: Diagnosis not present

## 2021-04-19 ENCOUNTER — Ambulatory Visit: Payer: Medicare Other | Admitting: Rehabilitation

## 2021-04-19 ENCOUNTER — Encounter: Payer: Self-pay | Admitting: Rehabilitation

## 2021-04-19 ENCOUNTER — Other Ambulatory Visit: Payer: Self-pay

## 2021-04-19 DIAGNOSIS — R293 Abnormal posture: Secondary | ICD-10-CM

## 2021-04-19 DIAGNOSIS — Z483 Aftercare following surgery for neoplasm: Secondary | ICD-10-CM | POA: Diagnosis not present

## 2021-04-19 DIAGNOSIS — C50412 Malignant neoplasm of upper-outer quadrant of left female breast: Secondary | ICD-10-CM

## 2021-04-19 DIAGNOSIS — Z170 Estrogen receptor positive status [ER+]: Secondary | ICD-10-CM | POA: Diagnosis not present

## 2021-04-19 DIAGNOSIS — R600 Localized edema: Secondary | ICD-10-CM

## 2021-04-19 DIAGNOSIS — M25512 Pain in left shoulder: Secondary | ICD-10-CM | POA: Diagnosis not present

## 2021-04-19 NOTE — Therapy (Signed)
[place], [place], [address] Phone: [phone]   Fax:  [phone]  Physical Therapy Treatment  Patient Details  Name: Dawn Powers MRN: [id number] Date of Birth: 02/09/1946 Referring Provider (PT): Dr. Aleda Grana   Encounter Date: 04/19/2021   PT End of Session - 04/19/21 0855     Visit Number 22    Number of Visits 26    PT Start Time 0802    PT Stop Time 5361    PT Time Calculation (min) 53 min    Activity Tolerance Patient tolerated treatment well    Behavior During Therapy [facility] for tasks assessed/performed             Past Medical History:  Diagnosis Date   Anxiety    Arthritis    Breast cancer ([place]) 09/19/2020   left breast [facility] w/ radiation no chemo   Cancer ([place])    Endometrial    Family history of breast cancer    Family history of kidney cancer    Family history of prostate cancer    Heart murmur    Hyperlipidemia    Hypertension    Neuromuscular disorder ([place])    DDD   Pain    lower back -hx "DDD"   Personal history of radiation therapy    left [facility] 08/2020 w/ radiation no chemo   Radiation 11/15/15, 11/23/15, 11/30/15, 12/07/15, 12/14/15   HDR brachytherapy    Past Surgical History:  Procedure Laterality Date   ABDOMINAL HYSTERECTOMY     BREAST LUMPECTOMY Left 10/19/2018   left lumpectomy 10-19-20   BREAST LUMPECTOMY WITH RADIOACTIVE SEED AND SENTINEL LYMPH NODE BIOPSY Left 10/19/2020   Procedure: LEFT BREAST LUMPECTOMY WITH RADIOACTIVE SEED AND SENTINEL LYMPH NODE BIOPSY;  Surgeon: Stark Klein, MD;  Location: [place];  Service: General;  Laterality: Left;  PEC BLOCK, RNFA   COLONOSCOPY     FRACTURE SURGERY Right    ankle   GANGLION CYST EXCISION     LYMPH NODE BIOPSY N/A 09/26/2015   Procedure: SENTINEL LYMPH NODE BIOPSY;  Surgeon: Everitt Amber, MD;  [facility];  Service: Gynecology;  Laterality: N/A;   ROBOTIC ASSISTED TOTAL HYSTERECTOMY  WITH BILATERAL SALPINGO OOPHERECTOMY Bilateral 09/26/2015   Procedure: ROBOTIC ASSISTED TOTAL HYSTERECTOMY WITH BILATERAL SALPINGO OOPHORECTOMY;  Surgeon: Everitt Amber, MD;  [facility];  Service: Gynecology;  Laterality: Bilateral;   TOTAL SHOULDER ARTHROPLASTY Left 09/10/2018   Procedure: TOTAL SHOULDER ARTHROPLASTY;  Surgeon: Justice Britain, MD;  Location: [place];  Service: Orthopedics;  Laterality: Left;  156min   WISDOM TOOTH EXTRACTION      There were no vitals filed for this visit.   Subjective Assessment - 04/19/21 0806     Subjective My mammogram was clear on the right side and [place].  I feel ready for this being the last day    Pertinent History Pt. presents to PT s/p Left Breast Lumpectomy with SLNB on 10/19/2020.  She was diagnosed during a routine mammogram.  She has a history of Edometrial CA. with hysterectomy in 2016 and radiation in 2017. She had 3 LN removed.  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  She has not had pathology report yet.  She has a left TSA    Currently in Pain? No/denies  [place] Adult PT Treatment/Exercise - 04/19/21 0001       Shoulder Exercises: Supine   Horizontal ABduction Strengthening;Both;10 reps    Theraband Level (Shoulder Horizontal ABduction) Level 2 (Red)    External Rotation Strengthening;Both;10 reps    Theraband Level (Shoulder External Rotation) Level 2 (Red)    Diagonals 10 reps;Both    Theraband Level (Shoulder Diagonals) Level 1 (Yellow)      Shoulder Exercises: Standing   Retraction 10 reps;Both      Manual Therapy   Soft tissue mobilization lateral breast, scar    Myofascial Release MFR to areas of cording left axilla, lateral trunk,    Manual Lymphatic Drainage (MLD) incorporating lateral breast MLD post STM    Passive ROM PROM to the left shoulder into flexion, abd, ER, D2 flexion                      PT Short Term Goals - 12/14/20 1201        PT SHORT TERM GOAL #1   Title Pt will be independent with HEP for left shoulder ROM/ strength    Time 4    Period Weeks    Status Achieved               PT Long Term Goals - 04/19/21 7078       PT LONG TERM GOAL #1   Title Pt will be independent in self Manual Lymph drainage to decrease swelling    Status Achieved      PT LONG TERM GOAL #2   Title Pt will be fit for compression bra/sleeve prn    Status Achieved      PT LONG TERM GOAL #3   Title Left shoulder AROM returned to normal pre surgery ROM per pt. (has left TSA)    Status Achieved      PT LONG TERM GOAL #4   Title Left chest/trunk swelling will be reduced greater than 50% per pt and will have no discomfort    Status Achieved      PT LONG TERM GOAL #5   Title Pt will have no left shouder limitations due to cording    Status Achieved                   Plan - 04/19/21 0857     Clinical Impression Statement D/C day today with all goals met.  One last session of MT and MFR focus also with focus on reviewing TE.  Pt is ready for DC at this time.    PT Frequency 1x / week    PT Duration 6 weeks    PT Treatment/Interventions ADLs/Self Care Home Management;Therapeutic activities;Therapeutic exercise;Neuromuscular re-education;Manual techniques;Patient/family education;Orthotic Fit/Training;Manual lymph drainage;Passive range of motion;Scar mobilization    PT Next Visit Plan MFR to left areas of cording and scar, breast MLD, start some general shoulder strength like supine scap or 4 way shoulder, chip pack?    Consulted and Agree with Plan of Care Patient             Patient will benefit from skilled therapeutic intervention in order to improve the following deficits and impairments:  Decreased mobility, Decreased strength, Increased edema, Impaired sensation, Postural dysfunction, Decreased scar mobility, Pain, Decreased skin integrity, Decreased knowledge of precautions, Decreased activity  tolerance, Impaired UE functional use, Decreased range of motion  Visit Diagnosis: Abnormal posture  Aftercare following surgery for neoplasm  Left shoulder pain, unspecified chronicity  Malignant neoplasm of  upper-outer quadrant of left breast in female, estrogen receptor positive ([place])  Localized edema     Problem List Patient Active Problem List   Diagnosis Date Noted   Genetic testing 12/18/2020   Family history of prostate cancer    Family history of breast cancer    Family history of kidney cancer    Malignant neoplasm of upper-outer quadrant of left breast in female, estrogen receptor positive ([place]) 09/21/2020   Prediabetes 07/02/2019   S/P shoulder replacement, 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   Endometrial cancer ([place]) 09/26/2015   Essential hypertension 05/23/2015   Thoracic facet joint syndrome 04/27/2014   Back pain 03/24/2014   Muscle spasm 03/24/2014   Scoliosis (and kyphoscoliosis), idiopathic 12/22/2012   Osteopenia 12/22/2012   Other and unspecified hyperlipidemia 12/22/2012    Stark Bray 04/19/2021, 9:13 AM  [place], [place], [address] Phone: [phone]   Fax:  [phone]  Name: Dawn Powers MRN: [id number] Date of Birth: 10-23-1945

## 2021-04-23 DIAGNOSIS — H04123 Dry eye syndrome of bilateral lacrimal glands: Secondary | ICD-10-CM | POA: Diagnosis not present

## 2021-04-23 DIAGNOSIS — H0102A Squamous blepharitis right eye, upper and lower eyelids: Secondary | ICD-10-CM | POA: Diagnosis not present

## 2021-04-23 DIAGNOSIS — H18453 Nodular corneal degeneration, bilateral: Secondary | ICD-10-CM | POA: Diagnosis not present

## 2021-04-23 DIAGNOSIS — H0102B Squamous blepharitis left eye, upper and lower eyelids: Secondary | ICD-10-CM | POA: Diagnosis not present

## 2021-04-23 DIAGNOSIS — H52213 Irregular astigmatism, bilateral: Secondary | ICD-10-CM | POA: Diagnosis not present

## 2021-04-23 DIAGNOSIS — H10413 Chronic giant papillary conjunctivitis, bilateral: Secondary | ICD-10-CM | POA: Diagnosis not present

## 2021-04-23 DIAGNOSIS — H2513 Age-related nuclear cataract, bilateral: Secondary | ICD-10-CM | POA: Diagnosis not present

## 2021-05-07 DIAGNOSIS — H0102A Squamous blepharitis right eye, upper and lower eyelids: Secondary | ICD-10-CM | POA: Diagnosis not present

## 2021-05-07 DIAGNOSIS — H52213 Irregular astigmatism, bilateral: Secondary | ICD-10-CM | POA: Diagnosis not present

## 2021-05-07 DIAGNOSIS — C50412 Malignant neoplasm of upper-outer quadrant of left female breast: Secondary | ICD-10-CM | POA: Diagnosis not present

## 2021-05-07 DIAGNOSIS — H04123 Dry eye syndrome of bilateral lacrimal glands: Secondary | ICD-10-CM | POA: Diagnosis not present

## 2021-05-07 DIAGNOSIS — H10413 Chronic giant papillary conjunctivitis, bilateral: Secondary | ICD-10-CM | POA: Diagnosis not present

## 2021-05-07 DIAGNOSIS — H2513 Age-related nuclear cataract, bilateral: Secondary | ICD-10-CM | POA: Diagnosis not present

## 2021-05-07 DIAGNOSIS — H18453 Nodular corneal degeneration, bilateral: Secondary | ICD-10-CM | POA: Diagnosis not present

## 2021-05-07 DIAGNOSIS — Z170 Estrogen receptor positive status [ER+]: Secondary | ICD-10-CM | POA: Diagnosis not present

## 2021-05-07 DIAGNOSIS — I890 Lymphedema, not elsewhere classified: Secondary | ICD-10-CM | POA: Diagnosis not present

## 2021-05-07 DIAGNOSIS — R1031 Right lower quadrant pain: Secondary | ICD-10-CM | POA: Diagnosis not present

## 2021-05-07 DIAGNOSIS — H0102B Squamous blepharitis left eye, upper and lower eyelids: Secondary | ICD-10-CM | POA: Diagnosis not present

## 2021-05-08 ENCOUNTER — Other Ambulatory Visit: Payer: Self-pay

## 2021-05-08 ENCOUNTER — Inpatient Hospital Stay: Payer: Medicare Other | Attending: Gynecologic Oncology

## 2021-05-08 ENCOUNTER — Inpatient Hospital Stay (HOSPITAL_BASED_OUTPATIENT_CLINIC_OR_DEPARTMENT_OTHER): Payer: Medicare Other | Admitting: Oncology

## 2021-05-08 VITALS — BP 166/71 | HR 70 | Temp 97.7°F | Resp 18 | Ht 65.0 in | Wt 122.4 lb

## 2021-05-08 DIAGNOSIS — E785 Hyperlipidemia, unspecified: Secondary | ICD-10-CM | POA: Insufficient documentation

## 2021-05-08 DIAGNOSIS — Z8542 Personal history of malignant neoplasm of other parts of uterus: Secondary | ICD-10-CM | POA: Insufficient documentation

## 2021-05-08 DIAGNOSIS — C50412 Malignant neoplasm of upper-outer quadrant of left female breast: Secondary | ICD-10-CM

## 2021-05-08 DIAGNOSIS — G8929 Other chronic pain: Secondary | ICD-10-CM | POA: Insufficient documentation

## 2021-05-08 DIAGNOSIS — R011 Cardiac murmur, unspecified: Secondary | ICD-10-CM | POA: Insufficient documentation

## 2021-05-08 DIAGNOSIS — Z9221 Personal history of antineoplastic chemotherapy: Secondary | ICD-10-CM | POA: Diagnosis not present

## 2021-05-08 DIAGNOSIS — Z170 Estrogen receptor positive status [ER+]: Secondary | ICD-10-CM | POA: Insufficient documentation

## 2021-05-08 DIAGNOSIS — Z90710 Acquired absence of both cervix and uterus: Secondary | ICD-10-CM | POA: Diagnosis not present

## 2021-05-08 DIAGNOSIS — M549 Dorsalgia, unspecified: Secondary | ICD-10-CM | POA: Diagnosis not present

## 2021-05-08 DIAGNOSIS — Z87891 Personal history of nicotine dependence: Secondary | ICD-10-CM | POA: Diagnosis not present

## 2021-05-08 DIAGNOSIS — I10 Essential (primary) hypertension: Secondary | ICD-10-CM | POA: Insufficient documentation

## 2021-05-08 DIAGNOSIS — Z79899 Other long term (current) drug therapy: Secondary | ICD-10-CM | POA: Diagnosis not present

## 2021-05-08 DIAGNOSIS — C50919 Malignant neoplasm of unspecified site of unspecified female breast: Secondary | ICD-10-CM | POA: Insufficient documentation

## 2021-05-08 DIAGNOSIS — C541 Malignant neoplasm of endometrium: Secondary | ICD-10-CM | POA: Diagnosis not present

## 2021-05-08 DIAGNOSIS — Z79811 Long term (current) use of aromatase inhibitors: Secondary | ICD-10-CM | POA: Insufficient documentation

## 2021-05-08 DIAGNOSIS — Z90722 Acquired absence of ovaries, bilateral: Secondary | ICD-10-CM | POA: Diagnosis not present

## 2021-05-08 DIAGNOSIS — Z923 Personal history of irradiation: Secondary | ICD-10-CM | POA: Diagnosis not present

## 2021-05-08 LAB — CBC WITH DIFFERENTIAL/PLATELET
Abs Immature Granulocytes: 0.02 10*3/uL (ref 0.00–0.07)
Basophils Absolute: 0 10*3/uL (ref 0.0–0.1)
Basophils Relative: 1 %
Eosinophils Absolute: 0 10*3/uL (ref 0.0–0.5)
Eosinophils Relative: 1 %
HCT: 39.5 % (ref 36.0–46.0)
Hemoglobin: 13.4 g/dL (ref 12.0–15.0)
Immature Granulocytes: 0 %
Lymphocytes Relative: 21 %
Lymphs Abs: 1 10*3/uL (ref 0.7–4.0)
MCH: 33 pg (ref 26.0–34.0)
MCHC: 33.9 g/dL (ref 30.0–36.0)
MCV: 97.3 fL (ref 80.0–100.0)
Monocytes Absolute: 0.3 10*3/uL (ref 0.1–1.0)
Monocytes Relative: 6 %
Neutro Abs: 3.5 10*3/uL (ref 1.7–7.7)
Neutrophils Relative %: 71 %
Platelets: 216 10*3/uL (ref 150–400)
RBC: 4.06 MIL/uL (ref 3.87–5.11)
RDW: 12.3 % (ref 11.5–15.5)
WBC: 4.9 10*3/uL (ref 4.0–10.5)
nRBC: 0 % (ref 0.0–0.2)

## 2021-05-08 LAB — COMPREHENSIVE METABOLIC PANEL
ALT: 13 U/L (ref 0–44)
AST: 19 U/L (ref 15–41)
Albumin: 4.3 g/dL (ref 3.5–5.0)
Alkaline Phosphatase: 64 U/L (ref 38–126)
Anion gap: 11 (ref 5–15)
BUN: 16 mg/dL (ref 8–23)
CO2: 27 mmol/L (ref 22–32)
Calcium: 10.1 mg/dL (ref 8.9–10.3)
Chloride: 103 mmol/L (ref 98–111)
Creatinine, Ser: 0.97 mg/dL (ref 0.44–1.00)
GFR, Estimated: >60 mL/min (ref >60)
Glucose, Bld: 89 mg/dL (ref 70–99)
Potassium: 3.8 mmol/L (ref 3.5–5.1)
Sodium: 141 mmol/L (ref 135–145)
Total Bilirubin: 0.6 mg/dL (ref 0.3–1.2)
Total Protein: 7.4 g/dL (ref 6.5–8.1)

## 2021-05-08 NOTE — Progress Notes (Addendum)
[place]  [phone] Fax:(336) [phone]     ID: Dawn Powers DOB: 1945/12/21  MR#: [id number]  ZGY#:[id number]  Patient Care Team: Darreld Mclean, MD as PCP - General (Family Medicine) Nhung Danko, Virgie Dad, MD as Consulting Physician (Oncology) Stark Klein, MD as Consulting Physician (General Surgery) Everitt Amber, MD as Consulting Physician (Gynecologic Oncology) Gery Pray, MD as Consulting Physician (Radiation Oncology) Cheri Fowler, MD as Consulting Physician (Obstetrics and Gynecology) Rockwell Germany, RN as Nurse Navigator Tressie Ellis, Paulette Blanch, RN as Nurse Navigator Chauncey Cruel, MD OTHER MD:  CHIEF COMPLAINT: Estrogen receptor positive breast cancer  CURRENT TREATMENT: Aanstrozole   INTERVAL HISTORY: Dawn Powers returns today for follow-up of her estrogen-receptor positive breast cancer accompanied.  Since her last visit, she completed radiation therapy on 01/10/2021 under Dr. Isidore Moos.  She contacted [place] on 04/04/2021 to report a new tender, palpable right breast lump. She underwent diagnostic right breast mammography and right breast ultrasonography on 04/05/2021 showing no evidence of malignancy.  She continues on anastrozole.  She is tolerating this better and feels she will be able to continue it for the full 5 years.  REVIEW OF SYSTEMS: Dawn Powers tells me one of our phlebotomist today reviewed, reminded her of her sons long hospitalization at [facility].  This young man had born with small bowel atresia and lived to be 81.  This increased and blood pressure she tells me today.  She also is planning to undergo "corneal scraping" next week and expectation of surgery for cataracts.  Another complication: Her dog had a urinary tract infection and that is requiring to be taken out more frequently.  For exercise she continues to walk 2 times a day 20 minutes, does weigh twice a week.  She is also taking good dose of vitamin D and she tells me this was tested  and she has a good level.   COVID 19 VACCINATION STATUS: Status post Pfizer x2 with booster August 2021   HISTORY OF CURRENT ILLNESS:   From the original intake note:  Dawn Powers had routine screening mammography on 08/18/2020 showing a possible abnormality in the left breast. She underwent left diagnostic mammography with tomography and left breast ultrasonography at [facility] on 09/06/2020 showing: breast density category B; palpable 1.1 cm mass at 3 o'clock; normal-appearing left axillary lymph nodes.  Accordingly on 09/19/2020 she proceeded to biopsy of the left breast area in question. The pathology from this procedure ([id number]) showed: invasive mammary carcinoma, e-cadherin positive, grade 3. Prognostic indicators significant for: estrogen receptor, 95% positive and progesterone receptor, 10% % positive, both with strong staining intensity. Proliferation marker Ki67 at 10%. HER2 equivocal by immunohistochemistry with fluorescent in situ hybridization negative, with a signals ratio of 1.65 and copy #3.05.  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   S/p robotic hysterectomy, BSO, sentinel lymph node biopsy on 09/26/15 with high intermediate risk factors for recurrence, s/p vaginal brachytherapy adjuvant therapy completed in March, 2017.   The patient's subsequent history is as detailed below.   PAST MEDICAL HISTORY: Past Medical History:  Diagnosis Date   Anxiety    Arthritis    Breast cancer ([place]) 09/19/2020   left breast [facility] w/ radiation no chemo   Cancer ([place])    Endometrial    Family history of breast cancer    Family history of kidney cancer    Family history of prostate cancer  Heart murmur    Hyperlipidemia    Hypertension    Neuromuscular disorder (HCC)    DDD   Pain    lower back -hx "DDD"   Personal history of radiation therapy    left [facility] 08/2020 w/ radiation no chemo   Radiation  11/15/15, 11/23/15, 11/30/15, 12/07/15, 12/14/15   HDR brachytherapy    PAST SURGICAL HISTORY: Past Surgical History:  Procedure Laterality Date   ABDOMINAL HYSTERECTOMY     BREAST LUMPECTOMY Left 10/19/2018   left lumpectomy 10-19-20   BREAST LUMPECTOMY WITH RADIOACTIVE SEED AND SENTINEL LYMPH NODE BIOPSY Left 10/19/2020   Procedure: LEFT BREAST LUMPECTOMY WITH RADIOACTIVE SEED AND SENTINEL LYMPH NODE BIOPSY;  Surgeon: Stark Klein, MD;  Location: [facility];  Service: General;  Laterality: Left;  PEC BLOCK, RNFA   COLONOSCOPY     FRACTURE SURGERY Right    ankle   GANGLION CYST EXCISION     LYMPH NODE BIOPSY N/A 09/26/2015   Procedure: SENTINEL LYMPH NODE BIOPSY;  Surgeon: Everitt Amber, MD;  Location: [facility];  Service: Gynecology;  Laterality: N/A;   ROBOTIC ASSISTED TOTAL HYSTERECTOMY WITH BILATERAL SALPINGO OOPHERECTOMY Bilateral 09/26/2015   Procedure: ROBOTIC ASSISTED TOTAL HYSTERECTOMY WITH BILATERAL SALPINGO OOPHORECTOMY;  Surgeon: Everitt Amber, MD;  Location: [facility];  Service: Gynecology;  Laterality: Bilateral;   TOTAL SHOULDER ARTHROPLASTY Left 09/10/2018   Procedure: TOTAL SHOULDER ARTHROPLASTY;  Surgeon: Justice Britain, MD;  Location: [facility];  Service: Orthopedics;  Laterality: Left;  174min   WISDOM TOOTH EXTRACTION      FAMILY HISTORY: Family History  Problem Relation Age of Onset   Heart disease Mother    Alcohol abuse Mother        breast cancer - questionable    Dementia Mother    Alcohol abuse Father    Prostate cancer Father 78       prostate and colon- unsure of primary    Kidney cancer Brother 38   Alcohol abuse Maternal Grandfather    Alcohol abuse Sister    Basal cell carcinoma Sister    Breast cancer Cousin        double mastectomy, d. 67   Prostate cancer Brother 53   Esophageal cancer Neg Hx    Stomach cancer Neg Hx    Rectal cancer Neg Hx   The patient's father died with prostate cancer at the age of 19.  The patient's mother died from heart  disease at the age of 93.  She had had a mastectomy for unclear reasons.  The patient has 1 sister in good health and 2 brothers 1 of whom has had prostate cancer   GYNECOLOGIC HISTORY:  No LMP recorded. Patient has had a hysterectomy. Menarche: 75 years old Age at first live birth: 75 years old GX P 3 LMP 26 HRT about 2 years Hysterectomy? Yes, 08/2015 for endometrial cancer BSO? 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  Her husband Dawn Powers is a retired Administrator, teaching in the modern languages in film department.  At home it is just the 2 of them plus their hound dog Dawn Powers.  Daughter Dawn Powers "the librarian" works at [employer].  Second child Dawn Powers died at age 31.  Daughter Dawn Powers lives in [place].  The patient has 2 grandchildren and attends [place]    ADVANCED DIRECTIVES: In the absence of any documentation to the contrary, the patient's spouse is their HCPOA.    HEALTH MAINTENANCE: Social  History   Tobacco Use   Smoking status: Former    Years: 4.00    Types: Cigarettes    Quit date: 09/30/1998    Years since quitting: 22.6   Smokeless tobacco: Never  Vaping Use   Vaping Use: Never used  Substance Use Topics   Alcohol use: Yes    Alcohol/week: 3.0 standard drinks    Types: 3 Standard drinks or equivalent per week    Comment: 5 drinks per week   Drug use: No     Colonoscopy: 08/2015 (Dr. Carlean Purl), repeat not indicated due to age  PAP: Status post hysterectomy  Bone density: 08/2009, -2.0   Allergies  Allergen Reactions   Morphine And Related Hives    Current Outpatient Medications  Medication Sig Dispense Refill   cholecalciferol (VITAMIN D3) 25 MCG (1000 UNIT) tablet Take 2 tablets (2,000 Units total) by mouth daily.     anastrozole (ARIMIDEX) 1 MG tablet Take 1 tablet (1 mg total) by mouth daily. 90 tablet 4   losartan (COZAAR) 50 MG tablet Take 1.5 tablets (75 mg total) by mouth daily. 135 tablet 3    traZODone (DESYREL) 50 MG tablet Take 1.5 tablets (75 mg total) by mouth at bedtime as needed for sleep. (Patient taking differently: Take 50 mg by mouth at bedtime as needed for sleep.) 135 tablet 1   No current facility-administered medications for this visit.    OBJECTIVE: White woman who appears younger than stated age  50:   05/08/21 1338  BP: (!) 166/71  Pulse: 70  Resp: 18  Temp: 97.7 F (36.5 C)  SpO2: 100%      Body mass index is 20.37 kg/m.   Wt Readings from Last 3 Encounters:  05/08/21 122 lb 6.4 oz (55.5 kg)  04/11/21 120 lb (54.4 kg)  01/11/21 121 lb (54.9 kg)      ECOG FS:1 - Symptomatic but completely ambulatory  Sclerae unicteric, EOMs intact Wearing a mask No cervical or supraclavicular adenopathy Lungs no rales or rhonchi Heart regular rate and rhythm Abd soft, nontender, positive bowel sounds MSK no focal spinal tenderness, no upper extremity lymphedema Neuro: nonfocal, well oriented, appropriate affect Breasts: The right breast is unremarkable.  The left breast has undergone lumpectomy and radiation.  The cosmetic result is good.  There is no evidence of local recurrence.  Both axillae are benign.  LAB RESULTS:  CMP     Component Value Date/Time   NA 141 05/08/2021 1307   K 3.8 05/08/2021 1307   CL 103 05/08/2021 1307   CO2 27 05/08/2021 1307   GLUCOSE 89 05/08/2021 1307   BUN 16 05/08/2021 1307   CREATININE 0.97 05/08/2021 1307   CREATININE 0.97 (H) 07/31/2020 1045   CALCIUM 10.1 05/08/2021 1307   PROT 7.4 05/08/2021 1307   ALBUMIN 4.3 05/08/2021 1307   AST 19 05/08/2021 1307   ALT 13 05/08/2021 1307   ALKPHOS 64 05/08/2021 1307   BILITOT 0.6 05/08/2021 1307   GFRNONAA >60 05/08/2021 1307   GFRNONAA 70 04/11/2016 1200   GFRAA >60 09/03/2018 0934   GFRAA 80 04/11/2016 1200    Lab Results  Component Value Date   TOTALPROTELP 6.5 07/20/2014   TOTALPROTELP 6.5 07/20/2014   ALBUMINELP 63.3 07/20/2014   A1GS 4.7 07/20/2014   A2GS  11.4 07/20/2014   BETS 5.7 07/20/2014   BETA2SER 4.2 07/20/2014   GAMS 10.7 (L) 07/20/2014   MSPIKE NOT DET 07/20/2014   SPEI SEE NOTE 07/20/2014  Lab Results  Component Value Date   WBC 4.9 05/08/2021   NEUTROABS 3.5 05/08/2021   HGB 13.4 05/08/2021   HCT 39.5 05/08/2021   MCV 97.3 05/08/2021   PLT 216 05/08/2021    No results found for: LABCA2  No components found for: ZCHYIF027  No results for input(s): INR in the last 168 hours.  No results found for: LABCA2  No results found for: XAJ287  No results found for: OMV672  No results found for: CNO709  No results found for: CA2729  No components found for: HGQUANT  No results found for: CEA1 / No results found for: CEA1   No results found for: AFPTUMOR  No results found for: CHROMOGRNA  No results found for: KPAFRELGTCHN, LAMBDASER, KAPLAMBRATIO (kappa/lambda light chains)  No results found for: HGBA, HGBA2QUANT, HGBFQUANT, HGBSQUAN (Hemoglobinopathy evaluation)   No results found for: LDH  No results found for: IRON, TIBC, IRONPCTSAT (Iron and TIBC)  Lab Results  Component Value Date   FERRITIN 70.7 01/11/2021    Urinalysis    Component Value Date/Time   COLORURINE YELLOW 09/19/2015 1400   APPEARANCEUR CLEAR 09/19/2015 1400   LABSPEC 1.023 09/19/2015 1400   PHURINE 7.0 09/19/2015 1400   GLUCOSEU NEGATIVE 09/19/2015 [place] 09/19/2015 1400   BILIRUBINUR negative 11/06/2016 1411   BILIRUBINUR neg 05/23/2015 1003   KETONESUR negative 11/06/2016 1411   KETONESUR NEGATIVE 09/19/2015 1400   PROTEINUR negative 11/06/2016 1411   PROTEINUR NEGATIVE 09/19/2015 1400   UROBILINOGEN negative 11/06/2016 1411   NITRITE Negative 11/06/2016 1411   NITRITE NEGATIVE 09/19/2015 1400   LEUKOCYTESUR Negative 11/06/2016 1411    STUDIES: No results found.   ELIGIBLE FOR AVAILABLE RESEARCH PROTOCOL: AET  ASSESSMENT: 75 y.o. [place] woman status post right breast upper outer quadrant  biopsy 09/19/2020 for a clinical T1c N0, stage IA invasive ductal carcinoma, grade 3, estrogen and progesterone receptor positive, with an MIB-1 of 10% and HER-2 equivocal with FISH pending  (1) genetics testing 12/17/2020 through the [facility] +RNA.  Found no deleterious mutations in AIP, ALK, APC, ATM, AXIN2,BAP1,  BARD1, BLM, BMPR1A, BRCA1, BRCA2, BRIP1, CASR, CDC73, CDH1, CDK4, CDKN1B, CDKN1C, CDKN2A (p14ARF), CDKN2A (p16INK4a), CEBPA, CHEK2, CTNNA1, DICER1, DIS3L2, EGFR (c.2369C>T, p.Thr790Met variant only), EPCAM (Deletion/duplication testing only), FH, FLCN, GATA2, GPC3, GREM1 (Promoter region deletion/duplication testing only), HOXB13 (c.251G>A, p.Gly84Glu), HRAS, KIT, MAX, MEN1, MET, MITF (c.952G>A, p.Glu318Lys variant only), MLH1, MSH2, MSH3, MSH6, MUTYH, NBN, NF1, NF2, NTHL1, PALB2, PDGFRA, PHOX2B, PMS2, POLD1, POLE, POT1, PRKAR1A, PTCH1, PTEN, RAD50, RAD51C, RAD51D, RB1, RECQL4, RET, RUNX1, SDHAF2, SDHA (sequence changes only), SDHB, SDHC, SDHD, SMAD4, SMARCA4, SMARCB1, SMARCE1, STK11, SUFU, TERC, TERT, TMEM127, TP53, TSC1, TSC2, VHL, WRN and WT1  (2) s/p left lumpectomy and sentinel lymph node sampling 10/19/2020 for a pT1c pN0, stage IA invasive ductal carcinoma, grade 2, with negative margins   (3) Oncotype score of 15 predicts a risk of recurrence outseide the breast over the next 9 years of 4 % if the patient's oinlysystemic treatment is anti-estrogens; it predicts not benefit from chemotherapy  (4) adjuvant radiation 12/20/2020 through 01/10/2021 Site Technique Total Dose (Gy) Dose per Fx (Gy) Completed Fx Beam Energies  Breast, Left: Breast_Lt 3D 42.56/42.56 2.66 16/16 6X   (5) anastrozole started neoadjuvantly 09/21/2020  (a) bone density 05/28/2021   PLAN: Tanikka is tolerating anastrozole well and the plan will be to continue the total of 5 years.  She is scheduled for bone density later this month.  I am  setting her up for a virtual visit with me in September to  discuss that and decide whether or not we need to start pharmacologic agents for osteoporosis prevention  Otherwise she will see [place] again in 61months.  If she then sees Dr. Barry Dienes next August we can make the visits here yearly starting February 2023  Total encounter time 20 minutes.Sarajane Jews C. Tyrann Donaho, MD 05/08/2021 8:44 PM Medical Oncology and Hematology [facility] [place], [place] [address] Tel. [phone]    Fax. [phone]   This document serves as a record of services personally performed by Lurline Del, MD. It was created on his behalf by Wilburn Mylar, a trained medical scribe. The creation of this record is based on the scribe's personal observations and the provider's statements to them.   I, Lurline Del MD, have reviewed the above documentation for accuracy and completeness, and I agree with the above.   *Total Encounter Time as defined by the Centers for Medicare and Medicaid Services includes, in addition to the face-to-face time of a patient visit (documented in the note above) non-face-to-face time: obtaining and reviewing outside history, ordering and reviewing medications, tests or procedures, care coordination (communications with other health care professionals or caregivers) and documentation in the medical record.

## 2021-05-09 DIAGNOSIS — M5459 Other low back pain: Secondary | ICD-10-CM | POA: Diagnosis not present

## 2021-05-09 DIAGNOSIS — G8929 Other chronic pain: Secondary | ICD-10-CM | POA: Diagnosis not present

## 2021-05-10 ENCOUNTER — Telehealth: Payer: Self-pay | Admitting: Oncology

## 2021-05-10 NOTE — Telephone Encounter (Signed)
Scheduled appt 8/9 los. Pt aware.

## 2021-05-14 ENCOUNTER — Other Ambulatory Visit: Payer: Self-pay | Admitting: General Surgery

## 2021-05-14 DIAGNOSIS — R1031 Right lower quadrant pain: Secondary | ICD-10-CM

## 2021-05-16 DIAGNOSIS — H52212 Irregular astigmatism, left eye: Secondary | ICD-10-CM | POA: Diagnosis not present

## 2021-05-16 DIAGNOSIS — H18452 Nodular corneal degeneration, left eye: Secondary | ICD-10-CM | POA: Diagnosis not present

## 2021-05-28 ENCOUNTER — Ambulatory Visit
Admission: RE | Admit: 2021-05-28 | Discharge: 2021-05-28 | Disposition: A | Discharge status: Home / Self Care | Payer: Medicare Other | Source: Ambulatory Visit | Attending: Oncology | Admitting: Oncology

## 2021-05-28 ENCOUNTER — Other Ambulatory Visit: Payer: Self-pay

## 2021-05-28 DIAGNOSIS — C541 Malignant neoplasm of endometrium: Secondary | ICD-10-CM

## 2021-05-28 DIAGNOSIS — M85851 Other specified disorders of bone density and structure, right thigh: Secondary | ICD-10-CM | POA: Diagnosis not present

## 2021-05-28 DIAGNOSIS — M810 Age-related osteoporosis without current pathological fracture: Secondary | ICD-10-CM | POA: Diagnosis not present

## 2021-05-28 DIAGNOSIS — C50412 Malignant neoplasm of upper-outer quadrant of left female breast: Secondary | ICD-10-CM

## 2021-05-28 DIAGNOSIS — Z170 Estrogen receptor positive status [ER+]: Secondary | ICD-10-CM

## 2021-05-28 DIAGNOSIS — M47894 Other spondylosis, thoracic region: Secondary | ICD-10-CM

## 2021-06-04 ENCOUNTER — Other Ambulatory Visit: Payer: Self-pay | Admitting: Oncology

## 2021-06-12 ENCOUNTER — Telehealth (HOSPITAL_BASED_OUTPATIENT_CLINIC_OR_DEPARTMENT_OTHER): Payer: Medicare Other | Admitting: Oncology

## 2021-06-12 ENCOUNTER — Encounter: Payer: Self-pay | Admitting: Family Medicine

## 2021-06-12 ENCOUNTER — Encounter: Payer: Self-pay | Admitting: Oncology

## 2021-06-12 DIAGNOSIS — C50412 Malignant neoplasm of upper-outer quadrant of left female breast: Secondary | ICD-10-CM

## 2021-06-12 DIAGNOSIS — M810 Age-related osteoporosis without current pathological fracture: Secondary | ICD-10-CM

## 2021-06-12 DIAGNOSIS — C541 Malignant neoplasm of endometrium: Secondary | ICD-10-CM | POA: Diagnosis not present

## 2021-06-12 DIAGNOSIS — Z170 Estrogen receptor positive status [ER+]: Secondary | ICD-10-CM

## 2021-06-12 HISTORY — DX: Age-related osteoporosis without current pathological fracture: M81.0

## 2021-06-12 NOTE — Progress Notes (Signed)
[place]  [phone] [phone]     ID: JOANI COSMA DOB: 1946/08/18  MR#: [id number]  HFW#:[id number]  Patient Care Team: Darreld Mclean, MD as PCP - General (Family Medicine) Shloimy Michalski, Virgie Dad, MD as Consulting Physician (Oncology) Stark Klein, MD as Consulting Physician (General Surgery) Everitt Amber, MD as Consulting Physician (Gynecologic Oncology) Gery Pray, MD as Consulting Physician (Radiation Oncology) Cheri Fowler, MD as Consulting Physician (Obstetrics and Gynecology) Rockwell Germany, RN as Nurse Navigator Tressie Ellis, Paulette Blanch, RN as Nurse Navigator Chauncey Cruel, MD OTHER MD:  I connected with [name] on 06/12/21 at  4:30 PM EDT by telephone visit and verified that I am speaking with the correct person using two identifiers.   I discussed the limitations, risks, security and privacy concerns of performing an evaluation and management service by telemedicine and the availability of in-person appointments. I also discussed with the patient that there may be a patient responsible charge related to this service. The patient expressed understanding and agreed to proceed.   Other persons participating in the visit and their role in the encounter: None  Patient's location: Home Provider's location: Clinic  CHIEF COMPLAINT: Estrogen receptor positive breast cancer  CURRENT TREATMENT: Aanstrozole   INTERVAL HISTORY: Essence was contacted today for follow-up of her estrogen-receptor positive breast cancer  She continues on anastrozole.  She is tolerating this better and feels she will be able to continue it for the full 5 years.  However she just had a bone density which shows a T score of -3.6.  The point of today's call us to discuss it further.  REVIEW OF SYSTEMS: A detailed review of systems today is otherwise unremarkable  COVID 19 VACCINATION STATUS: Status post Pfizer x2 with booster August 2021   HISTORY  OF CURRENT ILLNESS:   From the original intake note:  [name] had routine screening mammography on 08/18/2020 showing a possible abnormality in the left breast. She underwent left diagnostic mammography with tomography and left breast ultrasonography at [facility] on 09/06/2020 showing: breast density category B; palpable 1.1 cm mass at 3 o'clock; normal-appearing left axillary lymph nodes.  Accordingly on 09/19/2020 she proceeded to biopsy of the left breast area in question. The pathology from this procedure ([id number]) showed: invasive mammary carcinoma, e-cadherin positive, grade 3. Prognostic indicators significant for: estrogen receptor, 95% positive and progesterone receptor, 10% % positive, both with strong staining intensity. Proliferation marker Ki67 at 10%. HER2 equivocal by immunohistochemistry with fluorescent in situ hybridization negative, with a signals ratio of 1.65 and copy #3.05.  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   S/p robotic hysterectomy, BSO, sentinel lymph node biopsy on 09/26/15 with high intermediate risk factors for recurrence, s/p vaginal brachytherapy adjuvant therapy completed in March, 2017.   The patient's subsequent history is as detailed below.   PAST MEDICAL HISTORY: Past Medical History:  Diagnosis Date   Anxiety    Arthritis    Breast cancer ([place]) 09/19/2020   left breast [facility] w/ radiation no chemo   Cancer ([place])    Endometrial    Family history of breast cancer    Family history of kidney cancer    Family history of prostate cancer    Heart murmur    Hyperlipidemia    Hypertension    Neuromuscular disorder ([place])    DDD   Pain    lower back -  hx "DDD"   Personal history of radiation therapy    left [facility] 08/2020 w/ radiation no chemo   Radiation 11/15/15, 11/23/15, 11/30/15, 12/07/15, 12/14/15   HDR brachytherapy    PAST SURGICAL HISTORY: Past Surgical History:   Procedure Laterality Date   ABDOMINAL HYSTERECTOMY     BREAST LUMPECTOMY Left 10/19/2018   left lumpectomy 10-19-20   BREAST LUMPECTOMY WITH RADIOACTIVE SEED AND SENTINEL LYMPH NODE BIOPSY Left 10/19/2020   Procedure: LEFT BREAST LUMPECTOMY WITH RADIOACTIVE SEED AND SENTINEL LYMPH NODE BIOPSY;  Surgeon: Stark Klein, MD;  Location: [facility];  Service: General;  Laterality: Left;  PEC BLOCK, RNFA   COLONOSCOPY     FRACTURE SURGERY Right    ankle   GANGLION CYST EXCISION     LYMPH NODE BIOPSY N/A 09/26/2015   Procedure: SENTINEL LYMPH NODE BIOPSY;  Surgeon: Everitt Amber, MD;  Location: [facility];  Service: Gynecology;  Laterality: N/A;   ROBOTIC ASSISTED TOTAL HYSTERECTOMY WITH BILATERAL SALPINGO OOPHERECTOMY Bilateral 09/26/2015   Procedure: ROBOTIC ASSISTED TOTAL HYSTERECTOMY WITH BILATERAL SALPINGO OOPHORECTOMY;  Surgeon: Everitt Amber, MD;  Location: [facility];  Service: Gynecology;  Laterality: Bilateral;   TOTAL SHOULDER ARTHROPLASTY Left 09/10/2018   Procedure: TOTAL SHOULDER ARTHROPLASTY;  Surgeon: Justice Britain, MD;  Location: [facility];  Service: Orthopedics;  Laterality: Left;  116min   WISDOM TOOTH EXTRACTION      FAMILY HISTORY: Family History  Problem Relation Age of Onset   Heart disease Mother    Alcohol abuse Mother        breast cancer - questionable    Dementia Mother    Alcohol abuse Father    Prostate cancer Father 59       prostate and colon- unsure of primary    Kidney cancer Brother 67   Alcohol abuse Maternal Grandfather    Alcohol abuse Sister    Basal cell carcinoma Sister    Breast cancer Cousin        double mastectomy, d. 32   Prostate cancer Brother 23   Esophageal cancer Neg Hx    Stomach cancer Neg Hx    Rectal cancer Neg Hx   The patient's father died with prostate cancer at the age of 71.  The patient's mother died from heart disease at the age of 56.  She had had a mastectomy for unclear reasons.  The patient has 1 sister in good health and  2 brothers 1 of whom has had prostate cancer   GYNECOLOGIC HISTORY:  No LMP recorded. Patient has had a hysterectomy. Menarche: 75 years old Age at first live birth: 75 years old GX P 3 LMP 74 HRT about 2 years Hysterectomy? Yes, 08/2015 for endometrial cancer BSO? 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  Her husband Elberta Fortis is a retired Administrator, teaching in the modern languages in film department.  At home it is just the 2 of them plus their hound dog Karrie Doffing.  Daughter Esqueda "the librarian" works at [employer].  Second child [name] died at age 52.  Daughter [name] lives in [place].  The patient has 2 grandchildren and attends [place]    ADVANCED DIRECTIVES: In the absence of any documentation to the contrary, the patient's spouse is their HCPOA.    HEALTH MAINTENANCE: Social History   Tobacco Use   Smoking status: Former    Years: 4.00    Types: Cigarettes    Quit date: 09/30/1998  Years since quitting: 22.7   Smokeless tobacco: Never  Vaping Use   Vaping Use: Never used  Substance Use Topics   Alcohol use: Yes    Alcohol/week: 3.0 standard drinks    Types: 3 Standard drinks or equivalent per week    Comment: 5 drinks per week   Drug use: No     Colonoscopy: 08/2015 (Dr. Carlean Purl), repeat not indicated due to age  PAP: Status post hysterectomy  Bone density: 08/2009, -2.0   Allergies  Allergen Reactions   Morphine And Related Hives    Current Outpatient Medications  Medication Sig Dispense Refill   anastrozole (ARIMIDEX) 1 MG tablet Take 1 tablet (1 mg total) by mouth daily. 90 tablet 4   cholecalciferol (VITAMIN D3) 25 MCG (1000 UNIT) tablet Take 2 tablets (2,000 Units total) by mouth daily.     losartan (COZAAR) 50 MG tablet Take 1.5 tablets (75 mg total) by mouth daily. 135 tablet 3   traZODone (DESYREL) 50 MG tablet Take 1.5 tablets (75 mg total) by mouth at bedtime as needed for sleep. (Patient taking  differently: Take 50 mg by mouth at bedtime as needed for sleep.) 135 tablet 1   No current facility-administered medications for this visit.    OBJECTIVE:   There were no vitals filed for this visit.     There is no height or weight on file to calculate BMI.   Wt Readings from Last 3 Encounters:  05/08/21 122 lb 6.4 oz (55.5 kg)  04/11/21 120 lb (54.4 kg)  01/11/21 121 lb (54.9 kg)      ECOG FS:1 - Symptomatic but completely ambulatory  Televisit 06/12/2021  CMP     Component Value Date/Time   NA 141 05/08/2021 1307   K 3.8 05/08/2021 1307   CL 103 05/08/2021 1307   CO2 27 05/08/2021 1307   GLUCOSE 89 05/08/2021 1307   BUN 16 05/08/2021 1307   CREATININE 0.97 05/08/2021 1307   CREATININE 0.97 (H) 07/31/2020 1045   CALCIUM 10.1 05/08/2021 1307   PROT 7.4 05/08/2021 1307   ALBUMIN 4.3 05/08/2021 1307   AST 19 05/08/2021 1307   ALT 13 05/08/2021 1307   ALKPHOS 64 05/08/2021 1307   BILITOT 0.6 05/08/2021 1307   GFRNONAA >60 05/08/2021 1307   GFRNONAA 70 04/11/2016 1200   GFRAA >60 09/03/2018 0934   GFRAA 80 04/11/2016 1200    Lab Results  Component Value Date   TOTALPROTELP 6.5 07/20/2014   TOTALPROTELP 6.5 07/20/2014   ALBUMINELP 63.3 07/20/2014   A1GS 4.7 07/20/2014   A2GS 11.4 07/20/2014   BETS 5.7 07/20/2014   BETA2SER 4.2 07/20/2014   GAMS 10.7 (L) 07/20/2014   MSPIKE NOT DET 07/20/2014   SPEI SEE NOTE 07/20/2014    Lab Results  Component Value Date   WBC 4.9 05/08/2021   NEUTROABS 3.5 05/08/2021   HGB 13.4 05/08/2021   HCT 39.5 05/08/2021   MCV 97.3 05/08/2021   PLT 216 05/08/2021    No results found for: LABCA2  No components found for: [id number]  No results for input(s): INR in the last 168 hours.  No results found for: LABCA2  No results found for: ALP379  No results found for: KWI097  No results found for: DZH299  No results found for: CA2729  No components found for: HGQUANT  No results found for: CEA1 / No results found  for: CEA1   No results found for: AFPTUMOR  No results found for: [place]  No results found  for: KPAFRELGTCHN, LAMBDASER, KAPLAMBRATIO (kappa/lambda light chains)  No results found for: HGBA, HGBA2QUANT, HGBFQUANT, HGBSQUAN (Hemoglobinopathy evaluation)   No results found for: LDH  No results found for: IRON, TIBC, IRONPCTSAT (Iron and TIBC)  Lab Results  Component Value Date   FERRITIN 70.7 01/11/2021    Urinalysis    Component Value Date/Time   COLORURINE YELLOW 09/19/2015 1400   APPEARANCEUR CLEAR 09/19/2015 1400   LABSPEC 1.023 09/19/2015 1400   PHURINE 7.0 09/19/2015 1400   GLUCOSEU NEGATIVE 09/19/2015 1400   HGBUR NEGATIVE 09/19/2015 1400   BILIRUBINUR negative 11/06/2016 1411   BILIRUBINUR neg 05/23/2015 1003   KETONESUR negative 11/06/2016 1411   KETONESUR NEGATIVE 09/19/2015 1400   PROTEINUR negative 11/06/2016 1411   PROTEINUR NEGATIVE 09/19/2015 1400   UROBILINOGEN negative 11/06/2016 1411   NITRITE Negative 11/06/2016 1411   NITRITE NEGATIVE 09/19/2015 1400   LEUKOCYTESUR Negative 11/06/2016 1411    STUDIES: DG Bone Density  Result Date: 05/28/2021 EXAM: DUAL X-RAY ABSORPTIOMETRY (DXA) FOR BONE MINERAL DENSITY IMPRESSION: Referring Physician:  Chauncey Cruel Your patient completed a bone mineral density test using GE Lunar iDXA system (analysis version: 16). Technologist: [place] PATIENT: Name: Capitola, Ladson Patient ID: [id number] Birth Date: Apr 01, 1946 Height: 62.5 in. Sex: Female Measured: 05/28/2021 Weight: 121.0 lbs. Indications: Advanced Age, Anastrazole, Bilateral Ovariectomy (65.51), Breast Cancer History, Caucasian, Estrogen Deficient, Hysterectomy, Postmenopausal Fractures: None Treatments: Vitamin D (E933.5) ASSESSMENT: The BMD measured at Forearm Radius 33% is 0.568 g/cm2 with a T-score of -3.6. This patient is considered osteoporotic according to [facility]) criteria. The quality of the exam is good. The lumbar spine was  excluded due to degenerative changes. Site Region Measured Date Measured Age YA BMD Significant CHANGE T-score Left Forearm Radius 33% 05/28/2021 77.1 -3.6 0.568 g/cm2 DualFemur Neck Right 05/28/2021 77.1 -2.4 0.706 g/cm2 DualFemur Total Mean 05/28/2021 77.1 -2.0 0.755 g/cm2 World Health Organization [facility]) criteria for post-menopausal, Caucasian Women: Normal       T-score at or above -1 SD Osteopenia   T-score between -1 and -2.5 SD Osteoporosis T-score at or below -2.5 SD RECOMMENDATION: 1. All patients should optimize calcium and vitamin D intake. 2. Consider FDA-approved medical therapies in postmenopausal women and men aged 6 years and older, based on the following: a. A hip or vertebral (clinical or morphometric) fracture. b. T-score = -2.5 at the femoral neck or spine after appropriate evaluation to exclude secondary causes. c. Low bone mass (T-score between -1.0 and -2.5 at the femoral neck or spine) and a 10-year probability of a hip fracture = 3% or a 10-year probability of a major osteoporosis-related fracture = 20% based on the [place]-adapted WHO algorithm. d. Clinician judgment and/or patient preferences may indicate treatment for people with 10-year fracture probabilities above or below these levels. FOLLOW-UP: Patients with diagnosis of osteoporosis or at high risk for fracture should have regular bone mineral density tests.? Patients eligible for Medicare are allowed routine testing every 2 years.? The testing frequency can be increased to one year for patients who have rapidly progressing disease, are receiving or discontinuing medical therapy to restore bone mass, or have additional risk factors. I have reviewed this study and agree with the findings. Mark A. Thornton Papas, M.D. [facility] Electronically Signed   By: Lavonia Dana M.D.   On: 05/28/2021 08:24     ELIGIBLE FOR AVAILABLE RESEARCH PROTOCOL: AET  ASSESSMENT: 75 y.o.  woman status post right breast upper outer quadrant  biopsy 09/19/2020 for a clinical T1c  N0, stage IA invasive ductal carcinoma, grade 3, estrogen and progesterone receptor positive, with an MIB-1 of 10% and HER-2 equivocal with FISH pending  (1) genetics testing 12/17/2020 through the [facility] Multi-Cancer Panel +RNA.  Found no deleterious mutations in AIP, ALK, APC, ATM, AXIN2,BAP1,  BARD1, BLM, BMPR1A, BRCA1, BRCA2, BRIP1, CASR, CDC73, CDH1, CDK4, CDKN1B, CDKN1C, CDKN2A (p14ARF), CDKN2A (p16INK4a), CEBPA, CHEK2, CTNNA1, DICER1, DIS3L2, EGFR (c.2369C>T, p.Thr790Met variant only), EPCAM (Deletion/duplication testing only), FH, FLCN, GATA2, GPC3, GREM1 (Promoter region deletion/duplication testing only), HOXB13 (c.251G>A, p.Gly84Glu), HRAS, KIT, MAX, MEN1, MET, MITF (c.952G>A, p.Glu318Lys variant only), MLH1, MSH2, MSH3, MSH6, MUTYH, NBN, NF1, NF2, NTHL1, PALB2, PDGFRA, PHOX2B, PMS2, POLD1, POLE, POT1, PRKAR1A, PTCH1, PTEN, RAD50, RAD51C, RAD51D, RB1, RECQL4, RET, RUNX1, SDHAF2, SDHA (sequence changes only), SDHB, SDHC, SDHD, SMAD4, SMARCA4, SMARCB1, SMARCE1, STK11, SUFU, TERC, TERT, TMEM127, TP53, TSC1, TSC2, VHL, WRN and WT1  (2) s/p left lumpectomy and sentinel lymph node sampling 10/19/2020 for a pT1c pN0, stage IA invasive ductal carcinoma, grade 2, with negative margins   (3) Oncotype score of 15 predicts a risk of recurrence outseide the breast over the next 9 years of 4 % if the patient's oinlysystemic treatment is anti-estrogens; it predicts not benefit from chemotherapy  (4) adjuvant radiation 12/20/2020 through 01/10/2021 Site Technique Total Dose (Gy) Dose per Fx (Gy) Completed Fx Beam Energies  Breast, Left: Breast_Lt 3D 42.56/42.56 2.66 16/16 6X   (5) anastrozole started neoadjuvantly 09/21/2020  (a) bone density 05/28/2021 shows a T score of -3.6.   PLAN: Hayden has significant osteoporosis and really does need to do more than take vitamin D calcium and exercise.  She has 2 choices.  She can continue on anastrozole, which she is tolerating  well, and add a bone building agent.  Otherwise she can switch to tamoxifen.  My recommendation was that she continue on anastrozole but add Prolia.  We discussed the possible toxicities side effects and complications of Prolia as well as the bisphosphonates including Reclast, Fosamax, and Boniva.  She understands that the risk of osteonecrosis of the jaw is exceedingly rare the doses and dosing intervals that she would be receiving these but certainly it is not 0.  She was not able to make a decision.  She wanted more information and I was able to email that to her.  I am going to give her another phone call in a week and hopefully we will be able to reach closure at that time  Total encounter time 20 minutes.Sarajane Jews C. Keia Rask, MD 06/12/2021 4:53 PM Medical Oncology and Hematology [facility] [place], [place] [address] Tel. [phone]    Fax. [phone]   This document serves as a record of services personally performed by Lurline Del, MD. It was created on his behalf by Wilburn Mylar, a trained medical scribe. The creation of this record is based on the scribe's personal observations and the provider's statements to them.   I, Lurline Del MD, have reviewed the above documentation for accuracy and completeness, and I agree with the above.   *Total Encounter Time as defined by the Centers for Medicare and Medicaid Services includes, in addition to the face-to-face time of a patient visit (documented in the note above) non-face-to-face time: obtaining and reviewing outside history, ordering and reviewing medications, tests or procedures, care coordination (communications with other health care professionals or caregivers) and documentation in the medical record.

## 2021-06-14 ENCOUNTER — Other Ambulatory Visit: Payer: Self-pay | Admitting: Oncology

## 2021-06-14 DIAGNOSIS — G8929 Other chronic pain: Secondary | ICD-10-CM | POA: Diagnosis not present

## 2021-06-14 DIAGNOSIS — M5459 Other low back pain: Secondary | ICD-10-CM | POA: Diagnosis not present

## 2021-06-19 NOTE — Progress Notes (Signed)
Dawn Powers  [phone] [phone]     ID: Dawn Powers DOB: Oct 13, 1945  MR#: [id number]  LKT#:[id number]  Patient Care Team: Dawn Mclean, MD as PCP - General (Family Medicine) Dawn Powers, Dawn Dad, MD as Consulting Physician (Oncology) Dawn Klein, MD as Consulting Physician (General Surgery) Dawn Amber, MD as Consulting Physician (Gynecologic Oncology) Dawn Pray, MD as Consulting Physician (Radiation Oncology) Dawn Fowler, MD as Consulting Physician (Obstetrics and Gynecology) Dawn Germany, RN as Nurse Navigator Dawn Powers, Dawn Blanch, RN as Nurse Navigator Dawn Cruel, MD OTHER MD:  I connected with Dawn Powers on 06/19/21 at  8:30 AM EDT by telephone visit and verified that I am speaking with the correct person using two identifiers.   I discussed the limitations, risks, security and privacy concerns of performing an evaluation and management service by telemedicine and the availability of in-person appointments. I also discussed with the patient that there may be a patient responsible charge related to this service. The patient expressed understanding and agreed to proceed.   Other persons participating in the visit and their role in the encounter: None  Patient's location: Home Provider's location: Clinic  CHIEF COMPLAINT: Estrogen receptor positive breast cancer  CURRENT TREATMENT: Aanstrozole   INTERVAL HISTORY: Dawn Powers was contacted today for follow-up of her estrogen-receptor positive breast cancer.  We have been trying to decide whether she should switch to tamoxifen because of her bone density issues (T score of -3.6) or continue anastrozole but add a pharmacologic agent.  REVIEW OF SYSTEMS: She continues to tolerate anastrozole remarkably well.  A detailed review of systems today is otherwise unremarkable  COVID 19 VACCINATION STATUS: Status post Pfizer x2 with booster August 2021   HISTORY OF CURRENT  ILLNESS:   From the original intake note:  Dawn Powers had routine screening mammography on 08/18/2020 showing a possible abnormality in the left breast. She underwent left diagnostic mammography with tomography and left breast ultrasonography at [facility] on 09/06/2020 showing: breast density category B; palpable 1.1 cm mass at 3 o'clock; normal-appearing left axillary lymph nodes.  Accordingly on 09/19/2020 she proceeded to biopsy of the left breast area in question. The pathology from this procedure ([id number]) showed: invasive mammary carcinoma, e-cadherin positive, grade 3. Prognostic indicators significant for: estrogen receptor, 95% positive and progesterone receptor, 10% % positive, both with strong staining intensity. Proliferation marker Ki67 at 10%. HER2 equivocal by immunohistochemistry with fluorescent in situ hybridization negative, with a signals ratio of 1.65 and copy #3.05.  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   S/p robotic hysterectomy, BSO, sentinel lymph node biopsy on 09/26/15 with high intermediate risk factors for recurrence, s/p vaginal brachytherapy adjuvant therapy completed in March, 2017.   The patient's subsequent history is as detailed below.   PAST MEDICAL HISTORY: Past Medical History:  Diagnosis Date   Anxiety    Arthritis    Breast cancer ([place]) 09/19/2020   left breast [facility] w/ radiation no chemo   Cancer ([place])    Endometrial    Family history of breast cancer    Family history of kidney cancer    Family history of prostate cancer    Heart murmur    Hyperlipidemia    Hypertension    Neuromuscular disorder ([place])    DDD   Osteoporosis 06/12/2021   Pain    lower back -hx "DDD"   Personal history of radiation  therapy    left [facility] 08/2020 w/ radiation no chemo   Radiation 11/15/15, 11/23/15, 11/30/15, 12/07/15, 12/14/15   HDR brachytherapy    PAST SURGICAL HISTORY: Past Surgical  History:  Procedure Laterality Date   ABDOMINAL HYSTERECTOMY     BREAST LUMPECTOMY Left 10/19/2018   left lumpectomy 10-19-20   BREAST LUMPECTOMY WITH RADIOACTIVE SEED AND SENTINEL LYMPH NODE BIOPSY Left 10/19/2020   Procedure: LEFT BREAST LUMPECTOMY WITH RADIOACTIVE SEED AND SENTINEL LYMPH NODE BIOPSY;  Surgeon: Dawn Klein, MD;  Location: [facility];  Service: General;  Laterality: Left;  PEC BLOCK, RNFA   COLONOSCOPY     FRACTURE SURGERY Right    ankle   GANGLION CYST EXCISION     LYMPH NODE BIOPSY N/A 09/26/2015   Procedure: SENTINEL LYMPH NODE BIOPSY;  Surgeon: Dawn Amber, MD;  Location: [facility];  Service: Gynecology;  Laterality: N/A;   ROBOTIC ASSISTED TOTAL HYSTERECTOMY WITH BILATERAL SALPINGO OOPHERECTOMY Bilateral 09/26/2015   Procedure: ROBOTIC ASSISTED TOTAL HYSTERECTOMY WITH BILATERAL SALPINGO OOPHORECTOMY;  Surgeon: Dawn Amber, MD;  Location: [facility];  Service: Gynecology;  Laterality: Bilateral;   TOTAL SHOULDER ARTHROPLASTY Left 09/10/2018   Procedure: TOTAL SHOULDER ARTHROPLASTY;  Surgeon: Justice Britain, MD;  Location: [facility];  Service: Orthopedics;  Laterality: Left;  172min   WISDOM TOOTH EXTRACTION      FAMILY HISTORY: Family History  Problem Relation Age of Onset   Heart disease Mother    Alcohol abuse Mother        breast cancer - questionable    Dementia Mother    Alcohol abuse Father    Prostate cancer Father 60       prostate and colon- unsure of primary    Kidney cancer Brother 75   Alcohol abuse Maternal Grandfather    Alcohol abuse Sister    Basal cell carcinoma Sister    Breast cancer Cousin        double mastectomy, d. 28   Prostate cancer Brother 81   Esophageal cancer Neg Hx    Stomach cancer Neg Hx    Rectal cancer Neg Hx   The patient's father died with prostate cancer at the age of 70.  The patient's mother died from heart disease at the age of 39.  She had had a mastectomy for unclear reasons.  The patient has 1 sister in good  health and 2 brothers 1 of whom has had prostate cancer   GYNECOLOGIC HISTORY:  No LMP recorded. Patient has had a hysterectomy. Menarche: 75 years old Age at first live birth: 75 years old GX P 3 LMP 34 HRT about 2 years Hysterectomy? Yes, 08/2015 for endometrial cancer BSO? 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  Her husband Elberta Fortis is a retired Administrator, teaching in the modern languages in film department.  At home it is just the 2 of them plus their hound dog Karrie Doffing.  Daughter Hitzeman "the librarian" works at [employer].  Second child [name] died at age 22.  Daughter [name] lives in [place].  The patient has 2 grandchildren and attends [employer]    ADVANCED DIRECTIVES: In the absence of any documentation to the contrary, the patient's spouse is their HCPOA.    HEALTH MAINTENANCE: Social History   Tobacco Use   Smoking status: Former    Years: 4.00    Types: Cigarettes    Quit date: 09/30/1998    Years since quitting: 22.7   Smokeless  tobacco: Never  Vaping Use   Vaping Use: Never used  Substance Use Topics   Alcohol use: Yes    Alcohol/week: 3.0 standard drinks    Types: 3 Standard drinks or equivalent per week    Comment: 5 drinks per week   Drug use: No     Colonoscopy: 08/2015 (Dr. Carlean Purl), repeat not indicated due to age  PAP: Status post hysterectomy  Bone density: 08/2009, -2.0   Allergies  Allergen Reactions   Morphine And Related Hives    Current Outpatient Medications  Medication Sig Dispense Refill   anastrozole (ARIMIDEX) 1 MG tablet Take 1 tablet (1 mg total) by mouth daily. 90 tablet 4   cholecalciferol (VITAMIN D3) 25 MCG (1000 UNIT) tablet Take 2 tablets (2,000 Units total) by mouth daily.     losartan (COZAAR) 50 MG tablet Take 1.5 tablets (75 mg total) by mouth daily. 135 tablet 3   traZODone (DESYREL) 50 MG tablet Take 1.5 tablets (75 mg total) by mouth at bedtime as needed for sleep. (Patient  taking differently: Take 50 mg by mouth at bedtime as needed for sleep.) 135 tablet 1   No current facility-administered medications for this visit.    OBJECTIVE:   There were no vitals filed for this visit.     There is no height or weight on file to calculate BMI.   Wt Readings from Last 3 Encounters:  05/08/21 122 lb 6.4 oz (55.5 kg)  04/11/21 120 lb (54.4 kg)  01/11/21 121 lb (54.9 kg)      ECOG FS:1 - Symptomatic but completely ambulatory  Televisit 06/12/2021  CMP     Component Value Date/Time   NA 141 05/08/2021 1307   K 3.8 05/08/2021 1307   CL 103 05/08/2021 1307   CO2 27 05/08/2021 1307   GLUCOSE 89 05/08/2021 1307   BUN 16 05/08/2021 1307   CREATININE 0.97 05/08/2021 1307   CREATININE 0.97 (H) 07/31/2020 1045   CALCIUM 10.1 05/08/2021 1307   PROT 7.4 05/08/2021 1307   ALBUMIN 4.3 05/08/2021 1307   AST 19 05/08/2021 1307   ALT 13 05/08/2021 1307   ALKPHOS 64 05/08/2021 1307   BILITOT 0.6 05/08/2021 1307   GFRNONAA >60 05/08/2021 1307   GFRNONAA 70 04/11/2016 1200   GFRAA >60 09/03/2018 0934   GFRAA 80 04/11/2016 1200    Lab Results  Component Value Date   TOTALPROTELP 6.5 07/20/2014   TOTALPROTELP 6.5 07/20/2014   ALBUMINELP 63.3 07/20/2014   A1GS 4.7 07/20/2014   A2GS 11.4 07/20/2014   BETS 5.7 07/20/2014   BETA2SER 4.2 07/20/2014   GAMS 10.7 (L) 07/20/2014   MSPIKE NOT DET 07/20/2014   SPEI SEE NOTE 07/20/2014    Lab Results  Component Value Date   WBC 4.9 05/08/2021   NEUTROABS 3.5 05/08/2021   HGB 13.4 05/08/2021   HCT 39.5 05/08/2021   MCV 97.3 05/08/2021   PLT 216 05/08/2021    No results found for: LABCA2  No components found for: [id number]  No results for input(s): INR in the last 168 hours.  No results found for: LABCA2  No results found for: IWP809  No results found for: XIP382  No results found for: NKN397  No results found for: CA2729  No components found for: HGQUANT  No results found for: CEA1 / No results  found for: CEA1   No results found for: AFPTUMOR  No results found for: CHROMOGRNA  No results found for: KPAFRELGTCHN, LAMBDASER, KAPLAMBRATIO (kappa/lambda light chains)  No results found for: HGBA, HGBA2QUANT, HGBFQUANT, HGBSQUAN (Hemoglobinopathy evaluation)   No results found for: LDH  No results found for: IRON, TIBC, IRONPCTSAT (Iron and TIBC)  Lab Results  Component Value Date   FERRITIN 70.7 01/11/2021    Urinalysis    Component Value Date/Time   COLORURINE YELLOW 09/19/2015 1400   APPEARANCEUR CLEAR 09/19/2015 1400   LABSPEC 1.023 09/19/2015 1400   PHURINE 7.0 09/19/2015 1400   GLUCOSEU NEGATIVE 09/19/2015 1400   HGBUR NEGATIVE 09/19/2015 1400   BILIRUBINUR negative 11/06/2016 1411   BILIRUBINUR neg 05/23/2015 1003   KETONESUR negative 11/06/2016 1411   KETONESUR NEGATIVE 09/19/2015 1400   PROTEINUR negative 11/06/2016 1411   PROTEINUR NEGATIVE 09/19/2015 1400   UROBILINOGEN negative 11/06/2016 1411   NITRITE Negative 11/06/2016 1411   NITRITE NEGATIVE 09/19/2015 1400   LEUKOCYTESUR Negative 11/06/2016 1411    STUDIES: DG Bone Density  Result Date: 05/28/2021 EXAM: DUAL X-RAY ABSORPTIOMETRY (DXA) FOR BONE MINERAL DENSITY IMPRESSION: Referring Physician:  Chauncey Powers Your patient completed a bone mineral density test using GE Lunar iDXA system (analysis version: 16). Technologist: [place] PATIENT: Name: Dawn, Powers Patient ID: [id number] Birth Date: 1946/03/04 Height: 62.5 in. Sex: Female Measured: 05/28/2021 Weight: 121.0 lbs. Indications: Advanced Age, Anastrazole, Bilateral Ovariectomy (65.51), Breast Cancer History, Caucasian, Estrogen Deficient, Hysterectomy, Postmenopausal Fractures: None Treatments: Vitamin D (E933.5) ASSESSMENT: The BMD measured at Forearm Radius 33% is 0.568 g/cm2 with a T-score of -3.6. This patient is considered osteoporotic according to [facility]) criteria. The quality of the exam is good. The lumbar spine  was excluded due to degenerative changes. Site Region Measured Date Measured Age YA BMD Significant CHANGE T-score Left Forearm Radius 33% 05/28/2021 77.1 -3.6 0.568 g/cm2 DualFemur Neck Right 05/28/2021 77.1 -2.4 0.706 g/cm2 DualFemur Total Mean 05/28/2021 77.1 -2.0 0.755 g/cm2 World Health Organization [facility]) criteria for post-menopausal, Caucasian Women: Normal       T-score at or above -1 SD Osteopenia   T-score between -1 and -2.5 SD Osteoporosis T-score at or below -2.5 SD RECOMMENDATION: 1. All patients should optimize calcium and vitamin D intake. 2. Consider FDA-approved medical therapies in postmenopausal women and men aged 58 years and older, based on the following: a. A hip or vertebral (clinical or morphometric) fracture. b. T-score = -2.5 at the femoral neck or spine after appropriate evaluation to exclude secondary causes. c. Low bone mass (T-score between -1.0 and -2.5 at the femoral neck or spine) and a 10-year probability of a hip fracture = 3% or a 10-year probability of a major osteoporosis-related fracture = 20% based on the [place]-adapted WHO algorithm. d. Clinician judgment and/or patient preferences may indicate treatment for people with 10-year fracture probabilities above or below these levels. FOLLOW-UP: Patients with diagnosis of osteoporosis or at high risk for fracture should have regular bone mineral density tests.? Patients eligible for Medicare are allowed routine testing every 2 years.? The testing frequency can be increased to one year for patients who have rapidly progressing disease, are receiving or discontinuing medical therapy to restore bone mass, or have additional risk factors. I have reviewed this study and agree with the findings. Mark A. Thornton Papas, M.D. [facility] Electronically Signed   By: Lavonia Dana M.D.   On: 05/28/2021 08:24     ELIGIBLE FOR AVAILABLE RESEARCH PROTOCOL: AET  ASSESSMENT: 75 y.o. [place] woman status post right breast upper outer  quadrant biopsy 09/19/2020 for a clinical T1c N0, stage IA invasive ductal carcinoma, grade 3,  estrogen and progesterone receptor positive, with an MIB-1 of 10% and HER-2 equivocal with FISH pending  (1) genetics testing 12/17/2020 through the [facility] Multi-Cancer Panel +RNA.  Found no deleterious mutations in AIP, ALK, APC, ATM, AXIN2,BAP1,  BARD1, BLM, BMPR1A, BRCA1, BRCA2, BRIP1, CASR, CDC73, CDH1, CDK4, CDKN1B, CDKN1C, CDKN2A (p14ARF), CDKN2A (p16INK4a), CEBPA, CHEK2, CTNNA1, DICER1, DIS3L2, EGFR (c.2369C>T, p.Thr790Met variant only), EPCAM (Deletion/duplication testing only), FH, FLCN, GATA2, GPC3, GREM1 (Promoter region deletion/duplication testing only), HOXB13 (c.251G>A, p.Gly84Glu), HRAS, KIT, MAX, MEN1, MET, MITF (c.952G>A, p.Glu318Lys variant only), MLH1, MSH2, MSH3, MSH6, MUTYH, NBN, NF1, NF2, NTHL1, PALB2, PDGFRA, PHOX2B, PMS2, POLD1, POLE, POT1, PRKAR1A, PTCH1, PTEN, RAD50, RAD51C, RAD51D, RB1, RECQL4, RET, RUNX1, SDHAF2, SDHA (sequence changes only), SDHB, SDHC, SDHD, SMAD4, SMARCA4, SMARCB1, SMARCE1, STK11, SUFU, TERC, TERT, TMEM127, TP53, TSC1, TSC2, VHL, WRN and WT1  (2) s/p left lumpectomy and sentinel lymph node sampling 10/19/2020 for a pT1c pN0, stage IA invasive ductal carcinoma, grade 2, with negative margins   (3) Oncotype score of 15 predicts a risk of recurrence outseide the breast over the next 9 years of 4 % if the patient's oinlysystemic treatment is anti-estrogens; it predicts not benefit from chemotherapy  (4) adjuvant radiation 12/20/2020 through 01/10/2021 Site Technique Total Dose (Gy) Dose per Fx (Gy) Completed Fx Beam Energies  Breast, Left: Breast_Lt 3D 42.56/42.56 2.66 16/16 6X   (5) anastrozole started neoadjuvantly 09/21/2020  (a) bone density 05/28/2021 shows a T score of -3.6.  (b) to start Prolia 07/04/2021   PLAN: Dawn Powers has done a great deal of reading and researching regarding both antiestrogens and pharmacologic agents.  She is concerned about the  possibility of osteonecrosis of the jaw.  This is not impossible but certainly quite rare when Prolia is used for osteoporosis namely 60 mg twice a year as opposed to when Delton See is used mainly 120 mg every month.  She understands the risk of transient hypocalcemia and bone aches for a day or 2 particularly after the first Prolia dose.  We are going to schedule that for 07/04/2021 and she will receive that every 6 months thereafter for 2 years.  We will decide at her next bone density which will be August 2020 for whether or not to continue  Total encounter time 15 minutes.Sarajane Jews C. Koren Plyler, MD 06/19/2021 5:07 PM Medical Oncology and Hematology [facility] [place], [place] [address] Tel. [phone]    Fax. [phone]   This document serves as a record of services personally performed by Lurline Del, MD. It was created on his behalf by Wilburn Mylar, a trained medical scribe. The creation of this record is based on the scribe's personal observations and the provider's statements to them.   I, Lurline Del MD, have reviewed the above documentation for accuracy and completeness, and I agree with the above.   *Total Encounter Time as defined by the Centers for Medicare and Medicaid Services includes, in addition to the face-to-face time of a patient visit (documented in the note above) non-face-to-face time: obtaining and reviewing outside history, ordering and reviewing medications, tests or procedures, care coordination (communications with other health care professionals or caregivers) and documentation in the medical record.

## 2021-06-20 ENCOUNTER — Inpatient Hospital Stay: Payer: Medicare Other | Attending: Gynecologic Oncology | Admitting: Oncology

## 2021-06-20 DIAGNOSIS — Z170 Estrogen receptor positive status [ER+]: Secondary | ICD-10-CM

## 2021-06-20 DIAGNOSIS — M810 Age-related osteoporosis without current pathological fracture: Secondary | ICD-10-CM

## 2021-06-20 DIAGNOSIS — C50412 Malignant neoplasm of upper-outer quadrant of left female breast: Secondary | ICD-10-CM

## 2021-06-27 ENCOUNTER — Telehealth: Payer: Self-pay | Admitting: *Deleted

## 2021-06-27 NOTE — Telephone Encounter (Signed)
This RN spoke with pt per her VM stating need to cancel her video visit with Dr Jana Hakim on 10/3 due to her husband have a procedure on that day.  This RN discussed with pt visit concerns regarding starting therapy with Prolia next week.  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  This RN reviewed above concerns including benefit of Prolia due to her known current osteoporosis that could lead to fractures.  This RN discussed her concerns with support as well as explanation of low risk of these issues.  She states she was seen recently by her dentist and use of the Prolia was discussed " but I didn't get xrays and maybe I should "  This RN informed above may be beneficial in helping her feel more comfortable proceeding with Prolia.  This RN informed her as well that the Prolia injection can be postponed while she gets this done.  Presently Dawn Powers wants to keep the appt for the injection as scheduled. She will call her dentist to discuss obtaining xrays.  If needed she will call this RN back.  This note will be forwarded to MD for review as well as when pt needs to be rescheduled for visit.

## 2021-06-28 ENCOUNTER — Telehealth: Payer: Self-pay | Admitting: Oncology

## 2021-06-28 NOTE — Telephone Encounter (Signed)
Scheduled per sch msg. Called and left msg. Mailed printout  

## 2021-06-30 DIAGNOSIS — Z23 Encounter for immunization: Secondary | ICD-10-CM | POA: Diagnosis not present

## 2021-07-02 ENCOUNTER — Telehealth: Payer: Medicare Other | Admitting: Oncology

## 2021-07-04 ENCOUNTER — Other Ambulatory Visit: Payer: Medicare Other

## 2021-07-04 ENCOUNTER — Ambulatory Visit: Payer: Medicare Other

## 2021-07-05 ENCOUNTER — Other Ambulatory Visit: Payer: Self-pay | Admitting: Family Medicine

## 2021-07-05 DIAGNOSIS — Z1231 Encounter for screening mammogram for malignant neoplasm of breast: Secondary | ICD-10-CM

## 2021-07-05 DIAGNOSIS — M5459 Other low back pain: Secondary | ICD-10-CM | POA: Diagnosis not present

## 2021-07-05 DIAGNOSIS — G8929 Other chronic pain: Secondary | ICD-10-CM | POA: Diagnosis not present

## 2021-07-06 ENCOUNTER — Other Ambulatory Visit: Payer: Self-pay | Admitting: Family Medicine

## 2021-07-06 ENCOUNTER — Other Ambulatory Visit: Payer: Self-pay | Admitting: *Deleted

## 2021-07-06 DIAGNOSIS — Z853 Personal history of malignant neoplasm of breast: Secondary | ICD-10-CM

## 2021-07-06 DIAGNOSIS — Z0000 Encounter for general adult medical examination without abnormal findings: Secondary | ICD-10-CM

## 2021-07-06 DIAGNOSIS — C50412 Malignant neoplasm of upper-outer quadrant of left female breast: Secondary | ICD-10-CM

## 2021-07-06 DIAGNOSIS — Z170 Estrogen receptor positive status [ER+]: Secondary | ICD-10-CM

## 2021-07-10 NOTE — Patient Instructions (Addendum)
Good to see you again today- I will be in touch with your labs asap Please see me in about 6 months assuming all is well  Let me know if you want me to treat you with an oral bisphosphonate such as fosamax for your osteoporosis- of course this is always up to you  Flu given today I will be in touch with your labs asap

## 2021-07-10 NOTE — Progress Notes (Signed)
[place] [facility] [address] [place], [place] [address] [phone]  Date:  07/16/2021   Name:  Dawn Powers   DOB:  1946/01/19   MRN:  [id number]  PCP:  Darreld Mclean, MD    Chief Complaint: 6 month follow up (Concerns/ questions: 1. Pt has had eye surgery on her cornea. 2. Wisdom teeth extraction. 3. Would like to discuss Osteoporosis. 4. Labs for HLD. 5. Pt says she has never had a medicare wellness check./Flu shot today: yes)   History of Present Illness:  Dawn Powers is a 75 y.o. very pleasant female patient who presents with the following:  Pt seen today for a 6 month follow-up visit Last seen by myself in April- history of pre-diabetes, HTN, endometrial cancer s/p hyst 2016, chronic back pain, breast cancer dx 12/21  At our last visit she had just finished her radiation for breast cancer  Anastazole planned for 5+ years  Seen by oncology- Dr Jana Hakim, about 3 weeks ago - they are considering treating her osteoporosis with Prolia.  She was found to have some dental disease and is worried about osteonecrosis of the jaw.  She is seeing her DDS this afternoon and will get their opinion  She wonders if an oral bisphosphonate or Prolia is more likely to cause osteonecrosis of the jaw.  We did some research together and discovered a study from November of last year.  It does appear the risk is actually greater with Prolia Risk of Osteonecrosis of the Jaw Under Denosumab Compared to Bisphosphonates in Patients With Osteoporosis Janean Sark Gravity Huselmann,Ueli Studer,Hans-Rudolf Ziswiler,Stephan Blaine Hamper First published: 16 August 2020 [email or web address]: 4  She notes that she is being treated for cataracts -part of the pretreatment involved "smoothing out" her cornea -this resulted in worsening of  her vision- she is not happy about this   Tetanus booster Flu - give today  Shingrix - she thinks she got the 2nd dose at [facility], we will call them and let her know   She notes that she is not feeling very hungry recently - however weight is stable Wt Readings from Last 3 Encounters:  07/16/21 122 lb 3.2 oz (55.4 kg)  05/08/21 122 lb 6.4 oz (55.5 kg)  04/11/21 120 lb (54.4 kg)    CMP and CBC on chart from August  Can check A1c and TSH for her today if she likes  She would also like to check her Vit d and B12, lipids Lab Results  Component Value Date   HGBA1C 5.7 07/16/2021    Patient Active Problem List   Diagnosis Date Noted   Osteoporosis 06/12/2021   Genetic testing 12/18/2020   Family history of prostate cancer    Family history of breast cancer    Family history of kidney cancer    Malignant neoplasm of upper-outer quadrant of left breast in female, estrogen receptor positive ([place]) 09/21/2020   Prediabetes 07/02/2019   S/P shoulder replacement, 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   Endometrial cancer ([place]) 09/26/2015   Essential hypertension 05/23/2015   Thoracic facet joint syndrome 04/27/2014   Back pain 03/24/2014   Muscle spasm 03/24/2014   Scoliosis (and kyphoscoliosis), idiopathic 12/22/2012   Osteopenia 12/22/2012   Other and unspecified hyperlipidemia 12/22/2012    Past Medical History:  Diagnosis Date   Anxiety    Arthritis    Breast cancer ([place]) 09/19/2020   left breast IMC w/  radiation no chemo   Cancer [facility])    Endometrial    Family history of breast cancer    Family history of kidney cancer    Family history of prostate cancer    Heart murmur    Hyperlipidemia    Hypertension    Neuromuscular disorder ([place])    DDD   Osteoporosis 06/12/2021   Pain    lower back -hx "DDD"   Personal history of radiation therapy    left [facility] 08/2020 w/ radiation no chemo   Radiation 11/15/15, 11/23/15, 11/30/15, 12/07/15, 12/14/15   HDR brachytherapy    Past Surgical History:   Procedure Laterality Date   ABDOMINAL HYSTERECTOMY     BREAST LUMPECTOMY Left 10/19/2018   left lumpectomy 10-19-20   BREAST LUMPECTOMY WITH RADIOACTIVE SEED AND SENTINEL LYMPH NODE BIOPSY Left 10/19/2020   Procedure: LEFT BREAST LUMPECTOMY WITH RADIOACTIVE SEED AND SENTINEL LYMPH NODE BIOPSY;  Surgeon: Stark Klein, MD;  Location: [place];  Service: General;  Laterality: Left;  PEC BLOCK, RNFA   COLONOSCOPY     FRACTURE SURGERY Right    ankle   GANGLION CYST EXCISION     LYMPH NODE BIOPSY N/A 09/26/2015   Procedure: SENTINEL LYMPH NODE BIOPSY;  Surgeon: Everitt Amber, MD;  Location: [facility];  Service: Gynecology;  Laterality: N/A;   ROBOTIC ASSISTED TOTAL HYSTERECTOMY WITH BILATERAL SALPINGO OOPHERECTOMY Bilateral 09/26/2015   Procedure: ROBOTIC ASSISTED TOTAL HYSTERECTOMY WITH BILATERAL SALPINGO OOPHORECTOMY;  Surgeon: Everitt Amber, MD;  Location: [facility];  Service: Gynecology;  Laterality: Bilateral;   TOTAL SHOULDER ARTHROPLASTY Left 09/10/2018   Procedure: TOTAL SHOULDER ARTHROPLASTY;  Surgeon: Justice Britain, MD;  Location: [place];  Service: Orthopedics;  Laterality: Left;  175min   WISDOM TOOTH EXTRACTION      Social History   Tobacco Use   Smoking status: Former    Years: 4.00    Types: Cigarettes    Quit date: 09/30/1998    Years since quitting: 22.8   Smokeless tobacco: Never  Vaping Use   Vaping Use: Never used  Substance Use Topics   Alcohol use: Yes    Alcohol/week: 3.0 standard drinks    Types: 3 Standard drinks or equivalent per week    Comment: 5 drinks per week   Drug use: No    Family History  Problem Relation Age of Onset   Heart disease Mother    Alcohol abuse Mother        breast cancer - questionable    Dementia Mother    Alcohol abuse Father    Prostate cancer Father 31       prostate and colon- unsure of primary    Kidney cancer Brother 1   Alcohol abuse Maternal Grandfather    Alcohol abuse Sister    Basal cell carcinoma Sister     Breast cancer Cousin        double mastectomy, d. 6   Prostate cancer Brother 50   Esophageal cancer Neg Hx    Stomach cancer Neg Hx    Rectal cancer Neg Hx     Allergies  Allergen Reactions   Morphine And Related Hives    Medication list has been reviewed and updated.  Current Outpatient Medications on File Prior to Visit  Medication Sig Dispense Refill   anastrozole (ARIMIDEX) 1 MG tablet Take 1 tablet (1 mg total) by mouth daily. 90 tablet 4   cholecalciferol (VITAMIN D3) 25 MCG (1000 UNIT) tablet Take 2 tablets (2,000 Units total)  by mouth daily.     losartan (COZAAR) 50 MG tablet Take 1.5 tablets (75 mg total) by mouth daily. (Patient taking differently: Take 75 mg by mouth daily. 1 TAB) 135 tablet 3   traZODone (DESYREL) 50 MG tablet Take 1.5 tablets (75 mg total) by mouth at bedtime as needed for sleep. (Patient taking differently: Take 50 mg by mouth at bedtime as needed for sleep. 1 TAB) 135 tablet 1   chlorhexidine (PERIDEX) 0.12 % solution SMARTSIG:10 By Mouth Twice Daily     prednisoLONE acetate (PRED FORTE) 1 % ophthalmic suspension      valACYclovir (VALTREX) 500 MG tablet Take 500 mg by mouth daily.     No current facility-administered medications on file prior to visit.    Review of Systems:  As per HPI- otherwise negative.   Physical Examination: Vitals:   07/16/21 1058  BP: 132/80  Pulse: 60  Resp: 18  Temp: 98.5 F (36.9 C)  SpO2: 99%   Vitals:   07/16/21 1058  Weight: 122 lb 3.2 oz (55.4 kg)  Height: 5\' 5"  (1.651 m)   Body mass index is 20.34 kg/m. Ideal Body Weight: Weight in (lb) to have BMI = 25: 149.9  GEN: no acute distress.  Slender build, looks well HEENT: Atraumatic, Normocephalic.  Ears and Nose: No external deformity. CV: RRR, No M/G/R. No JVD. No thrill. No extra heart sounds. PULM: CTA B, no wheezes, crackles, rhonchi. No retractions. No resp. distress. No accessory muscle use. ABD: S, NT, ND. No rebound. No HSM. EXTR: No  c/c/e PSYCH: Normally interactive. Conversant.    Assessment and Plan: Vitamin D deficiency - Plan: VITAMIN D 25 Hydroxy (Vit-D Deficiency, Fractures)  Essential hypertension  B12 deficiency - Plan: B12  Weight loss - Plan: TSH  Prediabetes - Plan: Hemoglobin A1c  Need for influenza vaccination - Plan: Flu Vaccine QUAD High Dose(Fluad)  Hyperlipidemia, unspecified hyperlipidemia type - Plan: Lipid panel  Age-related osteoporosis without current pathological fracture  Patient seen today with a few concerns.  Updated seasonal flu, discussed COVID booster She is interested in a few lab values as above Blood pressure well controlled Calisha has significant osteoporosis.  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  We discussed some options today.  It does appear that an oral bisphosphonate is less risky.  She is seeing her dentist this week and will discuss it with them-she will let me know if she would like a prescription  Signed Lamar Blinks, MD  Received her labs as follows:  Results for orders placed or performed in visit on 07/16/21  VITAMIN D 25 Hydroxy (Vit-D Deficiency, Fractures)  Result Value Ref Range   VITD 42.53 30.00 - 100.00 ng/mL  B12  Result Value Ref Range   Vitamin B-12 939 (H) 211 - 911 pg/mL  Hemoglobin A1c  Result Value Ref Range   Hgb A1c MFr Bld 5.7 4.6 - 6.5 %  TSH  Result Value Ref Range   TSH 3.08 0.35 - 5.50 uIU/mL  Lipid panel  Result Value Ref Range   Cholesterol 220 (H) 0 - 200 mg/dL   Triglycerides 77.0 0.0 - 149.0 mg/dL   HDL 85.70 >39.00 mg/dL   VLDL 15.4 0.0 - 40.0 mg/dL   LDL Cholesterol 119 (H) 0 - 99 mg/dL   Total CHOL/HDL Ratio 3    NonHDL 134.38

## 2021-07-16 ENCOUNTER — Other Ambulatory Visit: Payer: Self-pay

## 2021-07-16 ENCOUNTER — Encounter: Payer: Self-pay | Admitting: Family Medicine

## 2021-07-16 ENCOUNTER — Ambulatory Visit (INDEPENDENT_AMBULATORY_CARE_PROVIDER_SITE_OTHER): Payer: Medicare Other | Admitting: Family Medicine

## 2021-07-16 VITALS — BP 132/80 | HR 60 | Temp 98.5°F | Resp 18 | Ht 65.0 in | Wt 122.2 lb

## 2021-07-16 DIAGNOSIS — R7303 Prediabetes: Secondary | ICD-10-CM | POA: Diagnosis not present

## 2021-07-16 DIAGNOSIS — Z23 Encounter for immunization: Secondary | ICD-10-CM | POA: Diagnosis not present

## 2021-07-16 DIAGNOSIS — I10 Essential (primary) hypertension: Secondary | ICD-10-CM | POA: Diagnosis not present

## 2021-07-16 DIAGNOSIS — E559 Vitamin D deficiency, unspecified: Secondary | ICD-10-CM | POA: Diagnosis not present

## 2021-07-16 DIAGNOSIS — M810 Age-related osteoporosis without current pathological fracture: Secondary | ICD-10-CM

## 2021-07-16 DIAGNOSIS — R634 Abnormal weight loss: Secondary | ICD-10-CM

## 2021-07-16 DIAGNOSIS — E538 Deficiency of other specified B group vitamins: Secondary | ICD-10-CM

## 2021-07-16 DIAGNOSIS — E785 Hyperlipidemia, unspecified: Secondary | ICD-10-CM | POA: Diagnosis not present

## 2021-07-16 LAB — LIPID PANEL
Cholesterol: 220 mg/dL — ABNORMAL HIGH (ref 0–200)
HDL: 85.7 mg/dL (ref >39.00)
LDL Cholesterol: 119 mg/dL — ABNORMAL HIGH (ref 0–99)
NonHDL: 134.38
Total CHOL/HDL Ratio: 3
Triglycerides: 77 mg/dL (ref 0.0–149.0)
VLDL: 15.4 mg/dL (ref 0.0–40.0)

## 2021-07-16 LAB — HEMOGLOBIN A1C: Hgb A1c MFr Bld: 5.7 % (ref 4.6–6.5)

## 2021-07-16 LAB — VITAMIN B12: Vitamin B-12: 939 pg/mL — ABNORMAL HIGH (ref 211–911)

## 2021-07-16 LAB — TSH: TSH: 3.08 u[IU]/mL (ref 0.35–5.50)

## 2021-07-16 LAB — VITAMIN D 25 HYDROXY (VIT D DEFICIENCY, FRACTURES): VITD: 42.53 ng/mL (ref 30.00–100.00)

## 2021-07-26 DIAGNOSIS — M5459 Other low back pain: Secondary | ICD-10-CM | POA: Diagnosis not present

## 2021-07-26 DIAGNOSIS — G8929 Other chronic pain: Secondary | ICD-10-CM | POA: Diagnosis not present

## 2021-08-09 ENCOUNTER — Other Ambulatory Visit: Payer: Self-pay

## 2021-08-09 ENCOUNTER — Ambulatory Visit: Payer: Medicare Other

## 2021-08-09 ENCOUNTER — Ambulatory Visit
Admission: RE | Admit: 2021-08-09 | Discharge: 2021-08-09 | Disposition: A | Discharge status: Home / Self Care | Payer: Medicare Other | Source: Ambulatory Visit | Attending: Family Medicine | Admitting: Family Medicine

## 2021-08-09 DIAGNOSIS — Z853 Personal history of malignant neoplasm of breast: Secondary | ICD-10-CM

## 2021-08-13 ENCOUNTER — Encounter: Payer: Self-pay | Admitting: Family Medicine

## 2021-08-13 DIAGNOSIS — H52213 Irregular astigmatism, bilateral: Secondary | ICD-10-CM | POA: Diagnosis not present

## 2021-08-13 DIAGNOSIS — H18453 Nodular corneal degeneration, bilateral: Secondary | ICD-10-CM | POA: Diagnosis not present

## 2021-08-21 NOTE — Progress Notes (Signed)
[place]  Telephone:(336) [phone] Fax:(336) [phone]     ID: KARENE BRACKEN DOB: Apr 21, 1946  MR#: [id number]  BSW#:[id number]  Patient Care Team: Darreld Mclean, MD as PCP - General (Family Medicine) Magrinat, Virgie Dad, MD as Consulting Physician (Oncology) Stark Klein, MD as Consulting Physician (General Surgery) Everitt Amber, MD as Consulting Physician (Gynecologic Oncology) Gery Pray, MD as Consulting Physician (Radiation Oncology) Cheri Fowler, MD as Consulting Physician (Obstetrics and Gynecology) Rockwell Germany, RN as Nurse Navigator Tressie Ellis, Paulette Blanch, RN as Nurse Navigator Chauncey Cruel, MD OTHER MD:   CHIEF COMPLAINT: Estrogen receptor positive breast cancer  CURRENT TREATMENT: Anastrozole   INTERVAL HISTORY: Rozelle returns today for follow-up of her estrogen-receptor positive breast cancer.  We have been trying to decide whether she should switch to tamoxifen because of her bone density issues (T score of -3.6) or continue anastrozole but add a pharmacologic agent.  She is tolerating anastrozole well, with no unusual side effects.  She just saw an oral surgeon and had two wisdom teeth removed.  She was very pleased with the service she received and she was told by her oral surgeon that in his experience Fosamax is the agent that causes the least osteonecrosis.  This is an issue that she has been exploring.   REVIEW OF SYSTEMS: Onnie exercises regularly by walking as well as yoga and Pilates.  A detailed review of systems today was otherwise stable.  COVID 19 VACCINATION STATUS: Status post Pfizer x2 with booster August 2021   HISTORY OF CURRENT ILLNESS:   From the original intake note:  [name] had routine screening mammography on 08/18/2020 showing a possible abnormality in the left breast. She underwent left diagnostic mammography with tomography and left breast ultrasonography at [facility] on 09/06/2020 showing: breast  density category B; palpable 1.1 cm mass at 3 o'clock; normal-appearing left axillary lymph nodes.  Accordingly on 09/19/2020 she proceeded to biopsy of the left breast area in question. The pathology from this procedure ([id number]) showed: invasive mammary carcinoma, e-cadherin positive, grade 3. Prognostic indicators significant for: estrogen receptor, 95% positive and progesterone receptor, 10% % positive, both with strong staining intensity. Proliferation marker Ki67 at 10%. HER2 equivocal by immunohistochemistry with fluorescent in situ hybridization negative, with a signals ratio of 1.65 and copy #3.05.  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   S/p robotic hysterectomy, BSO, sentinel lymph node biopsy on 09/26/15 with high intermediate risk factors for recurrence, s/p vaginal brachytherapy adjuvant therapy completed in March, 2017.   The patient's subsequent history is as detailed below.   PAST MEDICAL HISTORY: Past Medical History:  Diagnosis Date   Anxiety    Arthritis    Breast cancer ([place]) 09/19/2020   left breast [facility] w/ radiation no chemo   Cancer ([place])    Endometrial    Family history of breast cancer    Family history of kidney cancer    Family history of prostate cancer    Heart murmur    Hyperlipidemia    Hypertension    Neuromuscular disorder ([place])    DDD   Osteoporosis 06/12/2021   Pain    lower back -hx "DDD"   Personal history of radiation therapy    left [facility] 08/2020 w/ radiation no chemo   Radiation 11/15/15, 11/23/15, 11/30/15, 12/07/15, 12/14/15   HDR brachytherapy    PAST SURGICAL HISTORY: Past Surgical History:  Procedure  Laterality Date   ABDOMINAL HYSTERECTOMY     BREAST LUMPECTOMY Left 10/19/2018   left lumpectomy 10-19-20   BREAST LUMPECTOMY WITH RADIOACTIVE SEED AND SENTINEL LYMPH NODE BIOPSY Left 10/19/2020   Procedure: LEFT BREAST LUMPECTOMY WITH RADIOACTIVE SEED AND SENTINEL LYMPH NODE  BIOPSY;  Surgeon: Stark Klein, MD;  Location: [place];  Service: General;  Laterality: Left;  PEC BLOCK, RNFA   COLONOSCOPY     FRACTURE SURGERY Right    ankle   GANGLION CYST EXCISION     LYMPH NODE BIOPSY N/A 09/26/2015   Procedure: SENTINEL LYMPH NODE BIOPSY;  Surgeon: Everitt Amber, MD;  Location: [facility];  Service: Gynecology;  Laterality: N/A;   ROBOTIC ASSISTED TOTAL HYSTERECTOMY WITH BILATERAL SALPINGO OOPHERECTOMY Bilateral 09/26/2015   Procedure: ROBOTIC ASSISTED TOTAL HYSTERECTOMY WITH BILATERAL SALPINGO OOPHORECTOMY;  Surgeon: Everitt Amber, MD;  Location: [facility];  Service: Gynecology;  Laterality: Bilateral;   TOTAL SHOULDER ARTHROPLASTY Left 09/10/2018   Procedure: TOTAL SHOULDER ARTHROPLASTY;  Surgeon: Justice Britain, MD;  Location: [place];  Service: Orthopedics;  Laterality: Left;  128mn   WISDOM TOOTH EXTRACTION      FAMILY HISTORY: Family History  Problem Relation Age of Onset   Heart disease Mother    Alcohol abuse Mother        breast cancer - questionable    Dementia Mother    Alcohol abuse Father    Prostate cancer Father 660      prostate and colon- unsure of primary    Kidney cancer Brother 725  Alcohol abuse Maternal Grandfather    Alcohol abuse Sister    Basal cell carcinoma Sister    Breast cancer Cousin        double mastectomy, d. 514  Prostate cancer Brother 725  Esophageal cancer Neg Hx    Stomach cancer Neg Hx    Rectal cancer Neg Hx   The patient's father died with prostate cancer at the age of 620  The patient's mother died from heart disease at the age of 831  She had had a mastectomy for unclear reasons.  The patient has 1 sister in good health and 2 brothers 1 of whom has had prostate cancer   GYNECOLOGIC HISTORY:  No LMP recorded. Patient has had a hysterectomy. Menarche: 75years old Age at first live birth: 75years old GX P 3 LMP 453HRT about 2 years Hysterectomy? Yes, 08/2015 for endometrial cancer BSO? yes   SOCIAL  HISTORY: (updated 08/2020)  ANoheliis retired from aData processing managerwork.  Her husband AElberta Fortisis a retired UAdministrator teaching in the modern languages in film department.  At home it is just the 2 of them plus their hound dog TKarrie Doffing  Daughter MWilhoite"the librarian" works at [employer]  Second child NElmyra Ricksdied at age 263  [place]  The patient has 2 grandchildren and attends [place]   ADVANCED DIRECTIVES: In the absence of any documentation to the contrary, the patient's spouse is their HCPOA.    HEALTH MAINTENANCE: Social History   Tobacco Use   Smoking status: Former    Years: 4.00    Types: Cigarettes    Quit date: 09/30/1998    Years since quitting: 22.9   Smokeless tobacco: Never  Vaping Use   Vaping Use: Never used  Substance Use Topics   Alcohol use: Yes    Alcohol/week: 3.0 standard drinks    Types: 3 Standard drinks  or equivalent per week    Comment: 5 drinks per week   Drug use: No     Colonoscopy: 08/2015 (Dr. Carlean Purl), repeat not indicated due to age  PAP: Status post hysterectomy  Bone density: 08/2009, -2.0   Allergies  Allergen Reactions   Morphine And Related Hives    Current Outpatient Medications  Medication Sig Dispense Refill   anastrozole (ARIMIDEX) 1 MG tablet Take 1 tablet (1 mg total) by mouth daily. 90 tablet 4   chlorhexidine (PERIDEX) 0.12 % solution SMARTSIG:10 By Mouth Twice Daily     cholecalciferol (VITAMIN D3) 25 MCG (1000 UNIT) tablet Take 2 tablets (2,000 Units total) by mouth daily.     losartan (COZAAR) 50 MG tablet Take 1.5 tablets (75 mg total) by mouth daily. (Patient taking differently: Take 75 mg by mouth daily. 1 TAB) 135 tablet 3   prednisoLONE acetate (PRED FORTE) 1 % ophthalmic suspension      traZODone (DESYREL) 50 MG tablet Take 1.5 tablets (75 mg total) by mouth at bedtime as needed for sleep. (Patient taking differently: Take 50 mg by mouth at bedtime as needed for sleep. 1 TAB) 135 tablet 1    valACYclovir (VALTREX) 500 MG tablet Take 500 mg by mouth daily.     No current facility-administered medications for this visit.    OBJECTIVE: White woman who appears younger than stated age  89:   08/22/21 1003  BP: (!) 141/65  Pulse: 76  Resp: 17  Temp: (!) 97 F (36.1 C)  SpO2: 97%       Body mass index is 20.35 kg/m.   Wt Readings from Last 3 Encounters:  08/22/21 122 lb 4.8 oz (55.5 kg)  07/16/21 122 lb 3.2 oz (55.4 kg)  05/08/21 122 lb 6.4 oz (55.5 kg)      ECOG FS:1 - Symptomatic but completely ambulatory  Sclerae unicteric, EOMs intact Wearing a mask No cervical or supraclavicular adenopathy Lungs no rales or rhonchi Heart regular rate and rhythm Abd soft, nontender, positive bowel sounds MSK no focal spinal tenderness, no upper extremity lymphedema Neuro: nonfocal, well oriented, appropriate affect Breasts: The right breast is status postlumpectomy and radiation.  There is no evidence of local recurrence.  The left breast and both axillae are benign.   CMP     Component Value Date/Time   NA 141 05/08/2021 1307   K 3.8 05/08/2021 1307   CL 103 05/08/2021 1307   CO2 27 05/08/2021 1307   GLUCOSE 89 05/08/2021 1307   BUN 16 05/08/2021 1307   CREATININE 0.97 05/08/2021 1307   CREATININE 0.97 (H) 07/31/2020 1045   CALCIUM 10.1 05/08/2021 1307   PROT 7.4 05/08/2021 1307   ALBUMIN 4.3 05/08/2021 1307   AST 19 05/08/2021 1307   ALT 13 05/08/2021 1307   ALKPHOS 64 05/08/2021 1307   BILITOT 0.6 05/08/2021 1307   GFRNONAA >60 05/08/2021 1307   GFRNONAA 70 04/11/2016 1200   GFRAA >60 09/03/2018 0934   GFRAA 80 04/11/2016 1200    Lab Results  Component Value Date   TOTALPROTELP 6.5 07/20/2014   TOTALPROTELP 6.5 07/20/2014   ALBUMINELP 63.3 07/20/2014   A1GS 4.7 07/20/2014   A2GS 11.4 07/20/2014   BETS 5.7 07/20/2014   BETA2SER 4.2 07/20/2014   GAMS 10.7 (L) 07/20/2014   MSPIKE NOT DET 07/20/2014   SPEI SEE NOTE 07/20/2014    Lab Results   Component Value Date   WBC 4.9 05/08/2021   NEUTROABS 3.5 05/08/2021   HGB  13.4 05/08/2021   HCT 39.5 05/08/2021   MCV 97.3 05/08/2021   PLT 216 05/08/2021    No results found for: LABCA2  No components found for: XIPJAS505  No results for input(s): INR in the last 168 hours.  No results found for: LABCA2  No results found for: LZJ673  No results found for: ALP379  No results found for: KWI097  No results found for: CA2729  No components found for: HGQUANT  No results found for: CEA1 / No results found for: CEA1   No results found for: AFPTUMOR  No results found for: CHROMOGRNA  No results found for: KPAFRELGTCHN, LAMBDASER, KAPLAMBRATIO (kappa/lambda light chains)  No results found for: HGBA, HGBA2QUANT, HGBFQUANT, HGBSQUAN (Hemoglobinopathy evaluation)   No results found for: LDH  No results found for: IRON, TIBC, IRONPCTSAT (Iron and TIBC)  Lab Results  Component Value Date   FERRITIN 70.7 01/11/2021    Urinalysis    Component Value Date/Time   COLORURINE YELLOW 09/19/2015 1400   APPEARANCEUR CLEAR 09/19/2015 1400   LABSPEC 1.023 09/19/2015 1400   PHURINE 7.0 09/19/2015 1400   GLUCOSEU NEGATIVE 09/19/2015 [place] 09/19/2015 1400   BILIRUBINUR negative 11/06/2016 1411   BILIRUBINUR neg 05/23/2015 1003   KETONESUR negative 11/06/2016 1411   KETONESUR NEGATIVE 09/19/2015 1400   PROTEINUR negative 11/06/2016 1411   PROTEINUR NEGATIVE 09/19/2015 1400   UROBILINOGEN negative 11/06/2016 1411   NITRITE Negative 11/06/2016 1411   NITRITE NEGATIVE 09/19/2015 1400   LEUKOCYTESUR Negative 11/06/2016 1411    STUDIES: No results found.   ELIGIBLE FOR AVAILABLE RESEARCH PROTOCOL: AET  ASSESSMENT: 75 y.o. [place] woman status post right breast upper outer quadrant biopsy 09/19/2020 for a clinical T1c N0, stage IA invasive ductal carcinoma, grade 3, estrogen and progesterone receptor positive, with an MIB-1 of 10% and HER-2 equivocal  with FISH pending  (1) genetics testing 12/17/2020 through the [facility]-Can[facility] Panel +RNA.  Found no deleterious mutations in AIP, ALK, APC, ATM, AXIN2,BAP1,  BARD1, BLM, BMPR1A, BRCA1, BRCA2, BRIP1, CASR, CDC73, CDH1, CDK4, CDKN1B, CDKN1C, CDKN2A (p14ARF), CDKN2A (p16INK4a), CEBPA, CHEK2, CTNNA1, DICER1, DIS3L2, EGFR (c.2369C>T, p.Thr790Met variant only), EPCAM (Deletion/duplication testing only), FH, FLCN, GATA2, GPC3, GREM1 (Promoter region deletion/duplication testing only), HOXB13 (c.251G>A, p.Gly84Glu), HRAS, KIT, MAX, MEN1, MET, MITF (c.952G>A, p.Glu318Lys variant only), MLH1, MSH2, MSH3, MSH6, MUTYH, NBN, NF1, NF2, NTHL1, PALB2, PDGFRA, PHOX2B, PMS2, POLD1, POLE, POT1, PRKAR1A, PTCH1, PTEN, RAD50, RAD51C, RAD51D, RB1, RECQL4, RET, RUNX1, SDHAF2, SDHA (sequence changes only), SDHB, SDHC, SDHD, SMAD4, SMARCA4, SMARCB1, SMARCE1, STK11, SUFU, TERC, TERT, TMEM127, TP53, TSC1, TSC2, VHL, WRN and WT1  (2) s/p left lumpectomy and sentinel lymph node sampling 10/19/2020 for a pT1c pN0, stage IA invasive ductal carcinoma, grade 2, with negative margins   (3) Oncotype score of 15 predicts a risk of recurrence outseide the breast over the next 9 years of 4 % if the patient's oinlysystemic treatment is anti-estrogens; it predicts not benefit from chemotherapy  (4) adjuvant radiation 12/20/2020 through 01/10/2021 Site Technique Total Dose (Gy) Dose per Fx (Gy) Completed Fx Beam Energies  Breast, Left: Breast_Lt 3D 42.56/42.56 2.66 16/16 6X   (5) anastrozole started neoadjuvantly 09/21/2020  (a) bone density 05/28/2021 shows a T score of -3.6.  (b) considering bisphosphonates versus denosumab   PLAN: Samadhi is coming up in a year from definitive surgery for her breast cancer with no evidence of disease recurrence.  This is very favorable  We are continuing to discuss pharmacologic agents for her bones.  She tells me her mother and grandmother both were quite slight and weight but also did not ever have  a fracture even though at times they did fall.  I gave her information from up-to-date on the bisphosphonates and Prolia with regards to the rate of osteonecrosis of the jaw.  At this point she is thinking that perhaps she will want to go on Fosamax at some point.  In any case we cannot go on any 1 of these agents for least 3 months since she just had 2 extractions.  She is scheduled for repeat mammography next month and to see [place] again in about 6 months.  She knows to call for any other issue that may develop before the next visit  Total encounter time 25 minutes.Sarajane Jews C. Kyndle Schlender, MD 08/22/2021 4:55 PM Medical Oncology and Hematology [facility] [place], [place] [address] Tel. [phone]    Fax. [phone]   This document serves as a record of services personally performed by Lurline Del, MD. It was created on his behalf by Wilburn Mylar, a trained medical scribe. The creation of this record is based on the scribe's personal observations and the provider's statements to them.   I, Lurline Del MD, have reviewed the above documentation for accuracy and completeness, and I agree with the above.   *Total Encounter Time as defined by the Centers for Medicare and Medicaid Services includes, in addition to the face-to-face time of a patient visit (documented in the note above) non-face-to-face time: obtaining and reviewing outside history, ordering and reviewing medications, tests or procedures, care coordination (communications with other health care professionals or caregivers) and documentation in the medical record.

## 2021-08-22 ENCOUNTER — Other Ambulatory Visit: Payer: Self-pay

## 2021-08-22 ENCOUNTER — Inpatient Hospital Stay: Payer: Medicare Other | Attending: Gynecologic Oncology | Admitting: Oncology

## 2021-08-22 VITALS — BP 141/65 | HR 76 | Temp 97.0°F | Resp 17 | Wt 122.3 lb

## 2021-08-22 DIAGNOSIS — Z923 Personal history of irradiation: Secondary | ICD-10-CM | POA: Insufficient documentation

## 2021-08-22 DIAGNOSIS — C50919 Malignant neoplasm of unspecified site of unspecified female breast: Secondary | ICD-10-CM | POA: Diagnosis not present

## 2021-08-22 DIAGNOSIS — Z79899 Other long term (current) drug therapy: Secondary | ICD-10-CM | POA: Diagnosis not present

## 2021-08-22 DIAGNOSIS — Z170 Estrogen receptor positive status [ER+]: Secondary | ICD-10-CM | POA: Diagnosis not present

## 2021-08-22 DIAGNOSIS — C541 Malignant neoplasm of endometrium: Secondary | ICD-10-CM

## 2021-08-22 DIAGNOSIS — C50412 Malignant neoplasm of upper-outer quadrant of left female breast: Secondary | ICD-10-CM | POA: Diagnosis not present

## 2021-08-22 DIAGNOSIS — Z87891 Personal history of nicotine dependence: Secondary | ICD-10-CM | POA: Insufficient documentation

## 2021-08-22 DIAGNOSIS — Z79811 Long term (current) use of aromatase inhibitors: Secondary | ICD-10-CM | POA: Diagnosis not present

## 2021-08-29 ENCOUNTER — Encounter: Payer: Self-pay | Admitting: General Practice

## 2021-08-29 NOTE — Progress Notes (Signed)
[facility] Spiritual Care Note  Met with Ms Dado in my office as planned, providing empathic listening, pastoral reflection, emotional support, normalization of feelings, and affirmation of strengths as she shared and processed several stressors related to health and personal life. We talked about the value of having a witness to our stories and suffering. We plan to follow up in my office on Thursday 12/15 at 2pm.   Chaplain Lisa Lundeen, MDiv, [facility] Pager [phone] Voicemail [phone]  

## 2021-08-30 ENCOUNTER — Ambulatory Visit
Admission: RE | Admit: 2021-08-30 | Discharge: 2021-08-30 | Disposition: A | Discharge status: Home / Self Care | Payer: Medicare Other | Source: Ambulatory Visit | Attending: Family Medicine | Admitting: Family Medicine

## 2021-08-30 DIAGNOSIS — Z853 Personal history of malignant neoplasm of breast: Secondary | ICD-10-CM | POA: Diagnosis not present

## 2021-08-30 DIAGNOSIS — R922 Inconclusive mammogram: Secondary | ICD-10-CM | POA: Diagnosis not present

## 2021-08-31 ENCOUNTER — Telehealth: Payer: Self-pay | Admitting: Family Medicine

## 2021-08-31 NOTE — Telephone Encounter (Signed)
Pt. Called in stated that she was recommended to start getting prolia shots. She did further research and she saw how costly it can be. She would like someone to call her to discuss the process of the prolia shot and the cost.

## 2021-09-06 NOTE — Telephone Encounter (Signed)
Clinical info submitted to insurance company. Waiting on summary of benefits to determine coverage. Will call patient to discuss once received.  

## 2021-09-12 DIAGNOSIS — H0102A Squamous blepharitis right eye, upper and lower eyelids: Secondary | ICD-10-CM | POA: Diagnosis not present

## 2021-09-12 DIAGNOSIS — H2512 Age-related nuclear cataract, left eye: Secondary | ICD-10-CM | POA: Diagnosis not present

## 2021-09-12 DIAGNOSIS — H04123 Dry eye syndrome of bilateral lacrimal glands: Secondary | ICD-10-CM | POA: Diagnosis not present

## 2021-09-12 DIAGNOSIS — H0102B Squamous blepharitis left eye, upper and lower eyelids: Secondary | ICD-10-CM | POA: Diagnosis not present

## 2021-09-12 DIAGNOSIS — G43B0 Ophthalmoplegic migraine, not intractable: Secondary | ICD-10-CM | POA: Diagnosis not present

## 2021-09-12 DIAGNOSIS — H18453 Nodular corneal degeneration, bilateral: Secondary | ICD-10-CM | POA: Diagnosis not present

## 2021-09-12 DIAGNOSIS — H10413 Chronic giant papillary conjunctivitis, bilateral: Secondary | ICD-10-CM | POA: Diagnosis not present

## 2021-09-13 ENCOUNTER — Encounter: Payer: Self-pay | Admitting: General Practice

## 2021-09-13 NOTE — Progress Notes (Signed)
[place] Spiritual Care Note  Met with Rolla by phone instead of in person due to weather. She is very welcoming of a safe space to share and process concerns and issues for discernment. Today we worked to reframe "I am a coward" because of anxiety/trepidation about travel to "I have discovered some feelings I have" about this. Removing (or at least reducing) judgment and self-blame is freeing and leads to more response-ability and empowerment. We plan to follow up in my office at 2pm on Thursday 1/5.   [place], [place], [facility] Pager [phone] Voicemail [phone]

## 2021-09-13 NOTE — Telephone Encounter (Signed)
Injection may not be appropriate:  Per last OV note: "Dawn Powers has significant osteoporosis.  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  We discussed some options today.  It does appear that an oral bisphosphonate is less risky.  She is seeing her dentist this week and will discuss it with them-she will let me know if she would like a prescription" - Will follow up on this.

## 2021-09-14 ENCOUNTER — Encounter: Payer: Self-pay | Admitting: Family Medicine

## 2021-09-14 NOTE — Telephone Encounter (Signed)
See below: I assume pt was to d/t therapy with Prolia.

## 2021-09-28 ENCOUNTER — Telehealth: Payer: Self-pay | Admitting: Family Medicine

## 2021-09-28 ENCOUNTER — Other Ambulatory Visit: Payer: Self-pay | Admitting: Family Medicine

## 2021-09-28 DIAGNOSIS — G894 Chronic pain syndrome: Secondary | ICD-10-CM

## 2021-09-28 NOTE — Telephone Encounter (Signed)
Medication:  HYDROcodone-acetaminophen (NORCO/VICODIN) 5-325 MG tablet [[id number]]   Has the patient contacted their pharmacy? No. (If no, request that the patient contact the pharmacy for the refill.) (If yes, when and what did the pharmacy advise?)     Preferred Pharmacy (with phone number or street name):  [facility], [place], [place] [address]  Phone:  [phone]  Fax:  [phone]     Agent: Please be advised that RX refills may take up to 3 business days. We ask that you follow-up with your pharmacy.

## 2021-09-29 ENCOUNTER — Encounter: Payer: Self-pay | Admitting: Family Medicine

## 2021-10-02 ENCOUNTER — Encounter: Payer: Self-pay | Admitting: Family Medicine

## 2021-10-02 NOTE — Telephone Encounter (Signed)
Okay for refill?  

## 2021-10-04 ENCOUNTER — Encounter: Payer: Self-pay | Admitting: General Practice

## 2021-10-04 NOTE — Progress Notes (Signed)
[place] Spiritual Care Note  Met with Shaguana in my office as planned. She brought a list of reflection items to share about after having done an informal examen (reflection) about the past year, particularly its health hurdles. She is working on a Network engineer of noticing when she feels happy and then recalling that happiness later in order to boost her mood, gratitude, and neuroplasticity. A particular joy has been becoming reacquainted with her flute after shoulder surgery and beginning to make music in community again. [place] programming--from yoga and meditation to writing--has also been very meaningful for her this year. Dannon is worried and hopeful about her cataract surgery next week, because reading books and music is so important to her.   Provided empathic listening, pastoral reflection, emotional support, and encouragement. We plan to follow up in my office on (or near) Thursday 2/2 at [place], [place], [facility] Pager [phone] Voicemail [phone]

## 2021-10-06 ENCOUNTER — Encounter: Payer: Self-pay | Admitting: Family Medicine

## 2021-10-11 DIAGNOSIS — H2512 Age-related nuclear cataract, left eye: Secondary | ICD-10-CM | POA: Diagnosis not present

## 2021-10-22 DIAGNOSIS — Z961 Presence of intraocular lens: Secondary | ICD-10-CM | POA: Diagnosis not present

## 2021-10-22 DIAGNOSIS — H2511 Age-related nuclear cataract, right eye: Secondary | ICD-10-CM | POA: Diagnosis not present

## 2021-10-25 DIAGNOSIS — H2511 Age-related nuclear cataract, right eye: Secondary | ICD-10-CM | POA: Diagnosis not present

## 2021-10-31 ENCOUNTER — Encounter: Payer: Self-pay | Admitting: General Practice

## 2021-10-31 NOTE — Progress Notes (Signed)
[place] Spiritual Care Note  Received and returned call from [facility], requesting to reschedule today's appointment when she has more clarity about her calendar. She plans to call when ready.   [place], [place], [facility] Pager [phone] Voicemail [phone]

## 2021-11-23 ENCOUNTER — Ambulatory Visit (INDEPENDENT_AMBULATORY_CARE_PROVIDER_SITE_OTHER): Payer: Medicare Other

## 2021-11-23 VITALS — Wt 122.0 lb

## 2021-11-23 DIAGNOSIS — Z0000 Encounter for general adult medical examination without abnormal findings: Secondary | ICD-10-CM

## 2021-11-23 DIAGNOSIS — I10 Essential (primary) hypertension: Secondary | ICD-10-CM

## 2021-11-23 NOTE — Progress Notes (Addendum)
.  Subjective:   Dawn Powers is a 76 y.o. female who presents for an Initial Medicare Annual Wellness Visit.  Review of Systems         Objective:    There were no vitals filed for this visit. There is no height or weight on file to calculate BMI.  Advanced Directives 11/21/2020 10/30/2020 10/19/2020 10/13/2020 10/10/2020 10/14/2019 09/10/2018  Does Patient Have a Medical Advance Directive? Yes Yes Yes Yes Yes Yes Yes  Type of Paramedic of [place];Living will Living will;Healthcare Power of Attorney Living will;Healthcare Power of Attorney Living will;Healthcare Power of [place];Living will - [place]  Does patient want to make changes to medical advance directive? No - Patient declined No - Patient declined No - Patient declined No - Patient declined - - No - Patient declined  Copy of [place] in Chart? Yes - validated most recent copy scanned in chart (See row information) Yes - validated most recent copy scanned in chart (See row information) - Yes - validated most recent copy scanned in chart (See row information) Yes - validated most recent copy scanned in chart (See row information) - Yes - validated most recent copy scanned in chart (See row information)  Would patient like information on creating a medical advance directive? - - - - - - -    Current Medications (verified) Outpatient Encounter Medications as of 11/23/2021  Medication Sig   anastrozole (ARIMIDEX) 1 MG tablet Take 1 tablet (1 mg total) by mouth daily.   chlorhexidine (PERIDEX) 0.12 % solution SMARTSIG:10 By Mouth Twice Daily   cholecalciferol (VITAMIN D3) 25 MCG (1000 UNIT) tablet Take 2 tablets (2,000 Units total) by mouth daily.   HYDROcodone-acetaminophen (NORCO/VICODIN) 5-325 MG tablet TAKE 1 OR 2 TABLETS EVERY 6 HOURS AS NEEDED FOR PAIN.   losartan (COZAAR) 50 MG tablet Take 1.5 tablets (75 mg total) by mouth daily.  (Patient taking differently: Take 75 mg by mouth daily. 1 TAB)   prednisoLONE acetate (PRED FORTE) 1 % ophthalmic suspension    traZODone (DESYREL) 50 MG tablet Take 1.5 tablets (75 mg total) by mouth at bedtime as needed for sleep. (Patient taking differently: Take 50 mg by mouth at bedtime as needed for sleep. 1 TAB)   valACYclovir (VALTREX) 500 MG tablet Take 500 mg by mouth daily.   No facility-administered encounter medications on file as of 11/23/2021.    Allergies (verified) Morphine and related   History: Past Medical History:  Diagnosis Date   Anxiety    Arthritis    Breast cancer ([place]) 09/19/2020   left breast [facility] w/ radiation no chemo   Cancer ([place])    Endometrial    Family history of breast cancer    Family history of kidney cancer    Family history of prostate cancer    Heart murmur    Hyperlipidemia    Hypertension    Neuromuscular disorder ([place])    DDD   Osteoporosis 06/12/2021   Pain    lower back -hx "DDD"   Personal history of radiation therapy    left [facility] 08/2020 w/ radiation no chemo   Radiation 11/15/15, 11/23/15, 11/30/15, 12/07/15, 12/14/15   HDR brachytherapy   Past Surgical History:  Procedure Laterality Date   ABDOMINAL HYSTERECTOMY     BREAST LUMPECTOMY Left 10/19/2018   left lumpectomy 10-19-20   BREAST LUMPECTOMY WITH RADIOACTIVE SEED AND SENTINEL LYMPH NODE BIOPSY Left 10/19/2020  Procedure: LEFT BREAST LUMPECTOMY WITH RADIOACTIVE SEED AND SENTINEL LYMPH NODE BIOPSY;  Surgeon: Stark Klein, MD;  Location: [facility];  Service: General;  Laterality: Left;  PEC BLOCK, RNFA   COLONOSCOPY     FRACTURE SURGERY Right    ankle   GANGLION CYST EXCISION     LYMPH NODE BIOPSY N/A 09/26/2015   Procedure: SENTINEL LYMPH NODE BIOPSY;  Surgeon: Everitt Amber, MD;  Location: [facility];  Service: Gynecology;  Laterality: N/A;   ROBOTIC ASSISTED TOTAL HYSTERECTOMY WITH BILATERAL SALPINGO OOPHERECTOMY Bilateral 09/26/2015   Procedure: ROBOTIC ASSISTED  TOTAL HYSTERECTOMY WITH BILATERAL SALPINGO OOPHORECTOMY;  Surgeon: Everitt Amber, MD;  Location: [facility];  Service: Gynecology;  Laterality: Bilateral;   TOTAL SHOULDER ARTHROPLASTY Left 09/10/2018   Procedure: TOTAL SHOULDER ARTHROPLASTY;  Surgeon: Justice Britain, MD;  Location: [place];  Service: Orthopedics;  Laterality: Left;  174min   WISDOM TOOTH EXTRACTION     Family History  Problem Relation Age of Onset   Heart disease Mother    Alcohol abuse Mother        breast cancer - questionable    Dementia Mother    Alcohol abuse Father    Prostate cancer Father 36       prostate and colon- unsure of primary    Kidney cancer Brother 43   Alcohol abuse Maternal Grandfather    Alcohol abuse Sister    Basal cell carcinoma Sister    Breast cancer Cousin        double mastectomy, d. 66   Prostate cancer Brother 13   Esophageal cancer Neg Hx    Stomach cancer Neg Hx    Rectal cancer Neg Hx    Social History   Socioeconomic History   Marital status: Married    Spouse name: Not on file   Number of children: 2   Years of education: Not on file   Highest education level: Not on file  Occupational History   Not on file  Tobacco Use   Smoking status: Former    Years: 4.00    Types: Cigarettes    Quit date: 09/30/1998    Years since quitting: 23.1   Smokeless tobacco: Never  Vaping Use   Vaping Use: Never used  Substance and Sexual Activity   Alcohol use: Yes    Alcohol/week: 3.0 standard drinks    Types: 3 Standard drinks or equivalent per week    Comment: 5 drinks per week   Drug use: No   Sexual activity: Yes    Partners: Female    Birth control/protection: Surgical  Other Topics Concern   Not on file  Social History Narrative   Married. Education: college and Other. Exercise 4 times a week for 50 minutes.   Social Determinants of Health   Financial Resource Strain: Not on file  Food Insecurity: Not on file  Transportation Needs: Not on file  Physical Activity: Not on file   Stress: Not on file  Social Connections: Not on file    Tobacco Counseling Counseling given: Not Answered   Clinical Intake:                 Diabetic? No         Activities of Daily Living No flowsheet data found.  Patient Care Team: [name], Gay Filler, MD as PCP - General (Family Medicine) Magrinat, Virgie Dad, MD (Inactive) as Consulting Physician (Oncology) Stark Klein, MD as Consulting Physician (General Surgery) Everitt Amber, MD as Consulting Physician (  Gynecologic Oncology) Gery Pray, MD as Consulting Physician (Radiation Oncology) Cheri Fowler, MD as Consulting Physician (Obstetrics and Gynecology) Rockwell Germany, RN as Nurse Navigator Tressie Ellis, Paulette Blanch, RN as Nurse Navigator  Indicate any recent Medical Services you may have received from other than [facility] providers in the past year (date may be approximate).     Assessment:   This is a routine wellness examination for [place].  Hearing/Vision screen No results found.  Dietary issues and exercise activities discussed:     Goals Addressed   None    Depression Screen PHQ 2/9 Scores 07/31/2020 12/12/2016 04/11/2016 06/27/2015 05/23/2015 03/24/2014  PHQ - 2 Score 0 0 0 1 1 4   PHQ- 9 Score - - - - - 10    Fall Risk Fall Risk  07/31/2020 04/28/2019 08/17/2018 05/01/2018 12/12/2016  Falls in the past year? 0 0 0 No No  Comment - [facility] Telephone Survey: data to providers prior to load [employer] Telephone Survey: data to providers prior to load [facility] Telephone Survey: data to providers prior to load -  Number falls in past yr: 0 - - - -  Injury with Fall? 0 - - - -    FALL RISK PREVENTION PERTAINING TO THE HOME:  Any stairs in or around the home? Yes If so, are there any without handrails? No  Home free of loose throw rugs in walkways, pet beds, electrical cords, etc? Yes  Adequate lighting in your home to reduce risk of falls? Yes   ASSISTIVE DEVICES UTILIZED TO PREVENT FALLS:  Life alert? No  Use of a  cane, walker or w/c? No  Grab bars in the bathroom? No  Shower chair or bench in shower? No  Elevated toilet seat or a handicapped toilet? Yes   TIMED UP AND GO:  Was the test performed? No . Phone visit   Cognitive Function:Normal cognitive status assessed by direct observation by this Nurse Health Advisor. No abnormalities found.          Immunizations Immunization History  Administered Date(s) Administered   Fluad Quad(high Dose 65+) 07/01/2019, 07/10/2020, 07/16/2021   Hepatitis A, Adult 07/09/2013, 01/22/2014   Hepatitis B, adult 07/09/2013, 08/16/2013, 01/22/2014   Influenza Split 07/30/2012   Influenza, High Dose Seasonal PF 06/05/2016, 06/30/2017   Influenza,inj,Quad PF,6+ Mos 07/19/2014, 05/23/2015   Influenza,inj,quad, With Preservative 05/31/2017   Influenza-Unspecified 07/15/2018, 06/03/2019   Meningococcal Polysaccharide 01/02/2010   PFIZER(Purple Top)SARS-COV-2 Vaccination 10/11/2019, 10/30/2019, 05/25/2020   Pfizer Covid-19 Vaccine Bivalent Booster 10yrs & up 06/30/2021   Pneumococcal Conjugate-13 05/17/2014   Pneumococcal Polysaccharide-23 01/02/2010   Td 01/27/2009   Zoster Recombinat (Shingrix) 06/02/2019, 08/18/2019   Zoster, Live 09/30/2009    TDAP status: Due, Education has been provided regarding the importance of this vaccine. Advised may receive this vaccine at local pharmacy or Health Dept. Aware to provide a copy of the vaccination record if obtained from local pharmacy or Health Dept. Verbalized acceptance and understanding.  Flu Vaccine status: Up to date  Pneumococcal vaccine status: Up to date  Covid-19 vaccine status: Completed vaccines  Qualifies for Shingles Vaccine? Yes   Zostavax completed Yes   Shingrix Completed?: Yes  Screening Tests Health Maintenance  Topic Date Due   TETANUS/TDAP  01/28/2019   MAMMOGRAM  08/30/2022   Pneumonia Vaccine 5+ Years old  Completed   INFLUENZA VACCINE  Completed   DEXA SCAN  Completed    COVID-19 Vaccine  Completed   Hepatitis C Screening  Completed   Zoster Vaccines-  Shingrix  Completed   HPV VACCINES  Aged Out   COLONOSCOPY (Pts 45-11yrs Insurance coverage will need to be confirmed)  Discontinued    Health Maintenance  Health Maintenance Due  Topic Date Due   TETANUS/TDAP  01/28/2019    Colorectal cancer screening: No longer required.   Mammogram status: Completed 08/30/2021. Repeat every year  Bone Density status: Completed 05/28/2021. Results reflect: Bone density results: OSTEOPOROSIS. Repeat every 2 years.  Lung Cancer Screening: (Low Dose CT Chest recommended if Age 74-80 years, 30 pack-year currently smoking OR have quit w/in 15years.) does not qualify.   Lung Cancer Screening Referral:   Additional Screening:  Hepatitis C Screening: does qualify; Completed 05/23/2015  Vision Screening: Recommended annual ophthalmology exams for early detection of glaucoma and other disorders of the eye. Is the patient up to date with their annual eye exam?  Yes  Who is the provider or what is the name of the office in which the patient attends annual eye exams? [facility]    Dental Screening: Recommended annual dental exams for proper oral hygiene  Community Resource Referral / Chronic Care Management: CRR required this visit?  No   CCM required this visit?  No      Plan:     I have personally reviewed and noted the following in the patients chart:   Medical and social history Use of alcohol, tobacco or illicit drugs  Current medications and supplements including opioid prescriptions. Patient is currently taking opioid prescriptions. Information provided to patient regarding non-opioid alternatives. Patient advised to discuss non-opioid treatment plan with their provider. Functional ability and status Nutritional status Physical activity Advanced directives List of other physicians Hospitalizations, surgeries, and ER visits in previous 12  months Vitals Screenings to include cognitive, depression, and falls Referrals and appointments  In addition, I have reviewed and discussed with patient certain preventive protocols, quality metrics, and best practice recommendations. A written personalized care plan for preventive services as well as general preventive health recommendations were provided to patient.     Duard Brady Aiysha Jillson, [place]   11/23/2021   Nurse Notes: AVS will be accessed through [facility]

## 2021-11-23 NOTE — Patient Instructions (Signed)
Dawn Powers , Thank you for taking time to come for your Medicare Wellness Visit. I appreciate your ongoing commitment to your health goals. Please review the following plan we discussed and let me know if I can assist you in the future.   Screening recommendations/referrals: Colonoscopy: no longer needed Mammogram: 08/30/2021 due 08/30/2022 Bone Density: 05/28/2021 due 05/29/2023 Recommended yearly ophthalmology/optometry visit for glaucoma screening and checkup Recommended yearly dental visit for hygiene and checkup  Vaccinations: Influenza vaccine: up to date Pneumococcal vaccine: up to date Tdap vaccine: Due-May obtain vaccine at our office or your local pharmacy.  Shingles vaccine: up to date   Covid-19:up to date  Advanced directives: Yes, in the system  Conditions/risks identified: see problem list  Next appointment: Follow up in one year for your annual wellness visit    Preventive Care 60 Years and Older, Female Preventive care refers to lifestyle choices and visits with your health care provider that can promote health and wellness. What does preventive care include? A yearly physical exam. This is also called an annual well check. Dental exams once or twice a year. Routine eye exams. Ask your health care provider how often you should have your eyes checked. Personal lifestyle choices, including: Daily care of your teeth and gums. Regular physical activity. Eating a healthy diet. Avoiding tobacco and drug use. Limiting alcohol use. Practicing safe sex. Taking low-dose aspirin every day. Taking vitamin and mineral supplements as recommended by your health care provider. What happens during an annual well check? The services and screenings done by your health care provider during your annual well check will depend on your age, overall health, lifestyle risk factors, and family history of disease. Counseling  Your health care provider may ask you questions about  your: Alcohol use. Tobacco use. Drug use. Emotional well-being. Home and relationship well-being. Sexual activity. Eating habits. History of falls. Memory and ability to understand (cognition). Work and work Statistician. Reproductive health. Screening  You may have the following tests or measurements: Height, weight, and BMI. Blood pressure. Lipid and cholesterol levels. These may be checked every 5 years, or more frequently if you are over 35 years old. Skin check. Lung cancer screening. You may have this screening every year starting at age 41 if you have a 30-pack-year history of smoking and currently smoke or have quit within the past 15 years. Fecal occult blood test (FOBT) of the stool. You may have this test every year starting at age 53. Flexible sigmoidoscopy or colonoscopy. You may have a sigmoidoscopy every 5 years or a colonoscopy every 10 years starting at age 38. Hepatitis C blood test. Hepatitis B blood test. Sexually transmitted disease (STD) testing. Diabetes screening. This is done by checking your blood sugar (glucose) after you have not eaten for a while (fasting). You may have this done every 1-3 years. Bone density scan. This is done to screen for osteoporosis. You may have this done starting at age 56. Mammogram. This may be done every 1-2 years. Talk to your health care provider about how often you should have regular mammograms. Talk with your health care provider about your test results, treatment options, and if necessary, the need for more tests. Vaccines  Your health care provider may recommend certain vaccines, such as: Influenza vaccine. This is recommended every year. Tetanus, diphtheria, and acellular pertussis (Tdap, Td) vaccine. You may need a Td booster every 10 years. Zoster vaccine. You may need this after age 9. Pneumococcal 13-valent conjugate (PCV13) vaccine. One  dose is recommended after age 87. Pneumococcal polysaccharide (PPSV23) vaccine.  One dose is recommended after age 55. Talk to your health care provider about which screenings and vaccines you need and how often you need them. This information is not intended to replace advice given to you by your health care provider. Make sure you discuss any questions you have with your health care provider. Document Released: 10/13/2015 Document Revised: 06/05/2016 Document Reviewed: 07/18/2015 Elsevier Interactive Patient Education  2017 [place] Prevention in the Home Falls can cause injuries. They can happen to people of all ages. There are many things you can do to make your home safe and to help prevent falls. What can I do on the outside of my home? Regularly fix the edges of walkways and driveways and fix any cracks. Remove anything that might make you trip as you walk through a door, such as a raised step or threshold. Trim any bushes or trees on the path to your home. Use bright outdoor lighting. Clear any walking paths of anything that might make someone trip, such as rocks or tools. Regularly check to see if handrails are loose or broken. Make sure that both sides of any steps have handrails. Any raised decks and porches should have guardrails on the edges. Have any leaves, snow, or ice cleared regularly. Use sand or salt on walking paths during winter. Clean up any spills in your garage right away. This includes oil or grease spills. What can I do in the bathroom? Use night lights. Install grab bars by the toilet and in the tub and shower. Do not use towel bars as grab bars. Use non-skid mats or decals in the tub or shower. If you need to sit down in the shower, use a plastic, non-slip stool. Keep the floor dry. Clean up any water that spills on the floor as soon as it happens. Remove soap buildup in the tub or shower regularly. Attach bath mats securely with double-sided non-slip rug tape. Do not have throw rugs and other things on the floor that can make  you trip. What can I do in the bedroom? Use night lights. Make sure that you have a light by your bed that is easy to reach. Do not use any sheets or blankets that are too big for your bed. They should not hang down onto the floor. Have a firm chair that has side arms. You can use this for support while you get dressed. Do not have throw rugs and other things on the floor that can make you trip. What can I do in the kitchen? Clean up any spills right away. Avoid walking on wet floors. Keep items that you use a lot in easy-to-reach places. If you need to reach something above you, use a strong step stool that has a grab bar. Keep electrical cords out of the way. Do not use floor polish or wax that makes floors slippery. If you must use wax, use non-skid floor wax. Do not have throw rugs and other things on the floor that can make you trip. What can I do with my stairs? Do not leave any items on the stairs. Make sure that there are handrails on both sides of the stairs and use them. Fix handrails that are broken or loose. Make sure that handrails are as long as the stairways. Check any carpeting to make sure that it is firmly attached to the stairs. Fix any carpet that is loose or worn.  Avoid having throw rugs at the top or bottom of the stairs. If you do have throw rugs, attach them to the floor with carpet tape. Make sure that you have a light switch at the top of the stairs and the bottom of the stairs. If you do not have them, ask someone to add them for you. What else can I do to help prevent falls? Wear shoes that: Do not have high heels. Have rubber bottoms. Are comfortable and fit you well. Are closed at the toe. Do not wear sandals. If you use a stepladder: Make sure that it is fully opened. Do not climb a closed stepladder. Make sure that both sides of the stepladder are locked into place. Ask someone to hold it for you, if possible. Clearly mark and make sure that you can  see: Any grab bars or handrails. First and last steps. Where the edge of each step is. Use tools that help you move around (mobility aids) if they are needed. These include: Canes. Walkers. Scooters. Crutches. Turn on the lights when you go into a dark area. Replace any light bulbs as soon as they burn out. Set up your furniture so you have a clear path. Avoid moving your furniture around. If any of your floors are uneven, fix them. If there are any pets around you, be aware of where they are. Review your medicines with your doctor. Some medicines can make you feel dizzy. This can increase your chance of falling. Ask your doctor what other things that you can do to help prevent falls. This information is not intended to replace advice given to you by your health care provider. Make sure you discuss any questions you have with your health care provider. Document Released: 07/13/2009 Document Revised: 02/22/2016 Document Reviewed: 10/21/2014 Elsevier Interactive Patient Education  2017 [employer].

## 2021-12-07 ENCOUNTER — Other Ambulatory Visit: Payer: Self-pay | Admitting: *Deleted

## 2021-12-07 MED ORDER — ANASTROZOLE 1 MG PO TABS: 1.0000 mg | ORAL_TABLET | Freq: Every day | ORAL | 4 refills | Status: DC

## 2021-12-10 ENCOUNTER — Other Ambulatory Visit: Payer: Self-pay

## 2021-12-10 MED ORDER — ANASTROZOLE 1 MG PO TABS: 1.0000 mg | ORAL_TABLET | Freq: Every day | ORAL | 0 refills | Status: DC

## 2021-12-19 ENCOUNTER — Other Ambulatory Visit: Payer: Self-pay | Admitting: Family Medicine

## 2021-12-19 ENCOUNTER — Telehealth: Payer: Self-pay | Admitting: Hematology and Oncology

## 2021-12-19 DIAGNOSIS — G4700 Insomnia, unspecified: Secondary | ICD-10-CM

## 2021-12-19 NOTE — Telephone Encounter (Signed)
Rescheduled appointment per providers. Patient aware.  ? ?

## 2021-12-20 DIAGNOSIS — L821 Other seborrheic keratosis: Secondary | ICD-10-CM | POA: Diagnosis not present

## 2021-12-20 DIAGNOSIS — D225 Melanocytic nevi of trunk: Secondary | ICD-10-CM | POA: Diagnosis not present

## 2021-12-20 DIAGNOSIS — L728 Other follicular cysts of the skin and subcutaneous tissue: Secondary | ICD-10-CM | POA: Diagnosis not present

## 2021-12-20 DIAGNOSIS — L218 Other seborrheic dermatitis: Secondary | ICD-10-CM | POA: Diagnosis not present

## 2021-12-20 DIAGNOSIS — L814 Other melanin hyperpigmentation: Secondary | ICD-10-CM | POA: Diagnosis not present

## 2021-12-20 DIAGNOSIS — L84 Corns and callosities: Secondary | ICD-10-CM | POA: Diagnosis not present

## 2022-01-03 NOTE — Progress Notes (Signed)
? ?Patient Care Team: ?[name], Gay Filler, MD as PCP - General (Family Medicine) ?Magrinat, Virgie Dad, MD (Inactive) as Consulting Physician (Oncology) ?Stark Klein, MD as Consulting Physician (General Surgery) ?Everitt Amber, MD as Consulting Physician (Gynecologic Oncology) ?Gery Pray, MD as Consulting Physician (Radiation Oncology) ?Cheri Fowler, MD as Consulting Physician (Obstetrics and Gynecology) ?Rockwell Germany, RN as Nurse Navigator ?Mauro Kaufmann, RN as Nurse Navigator ? ?DIAGNOSIS:  ?Encounter Diagnoses  ?Name Primary?  ? Malignant neoplasm of upper-outer quadrant of left breast in female, estrogen receptor positive ([place])   ? Essential hypertension   ? Insomnia, unspecified type   ? ? ?SUMMARY OF ONCOLOGIC HISTORY: ?Oncology History  ?Endometrial cancer ([place])  ?09/26/2015 Initial Diagnosis  ? Endometrial cancer ([place]) ? ?  ?09/21/2020 Cancer Staging  ? Staging form: Corpus Uteri - Carcinoma, AJCC 7th Edition ?- Clinical: FIGO Stage IB (T1b, N0, M0) - Signed by Chauncey Cruel, MD on 09/21/2020 ? ?  ?Malignant neoplasm of upper-outer quadrant of left breast in female, estrogen receptor positive ([name])  ?09/21/2020 Initial Diagnosis  ? Malignant neoplasm of upper-outer quadrant of left breast in female, estrogen receptor positive ([place]) ? ?  ?09/21/2020 Cancer Staging  ? Staging form: Breast, AJCC 8th Edition ?- Clinical: Stage IA (cT1c, cN0, cM0, G3, ER+, PR+, HER2: Equivocal) - Signed by Chauncey Cruel, MD on 09/21/2020 ? ?  ? Genetic Testing  ? Negative genetic testing. No pathogenic variants identified on the [facility] Multi-Cancer Panel +RNA. The report date is 12/17/2020. ? ?The Multi-Cancer Panel + RNA offered by [facility] includes sequencing and/or deletion duplication testing of the following 84 genes: AIP, ALK, APC, ATM, AXIN2,BAP1,  BARD1, BLM, BMPR1A, BRCA1, BRCA2, BRIP1, CASR, CDC73, CDH1, CDK4, CDKN1B, CDKN1C, CDKN2A (p14ARF), CDKN2A (p16INK4a), CEBPA, CHEK2, CTNNA1, DICER1, DIS3L2,  EGFR (c.2369C>T, p.Thr790Met variant only), EPCAM (Deletion/duplication testing only), FH, FLCN, GATA2, GPC3, GREM1 (Promoter region deletion/duplication testing only), HOXB13 (c.251G>A, p.Gly84Glu), HRAS, KIT, MAX, MEN1, MET, MITF (c.952G>A, p.Glu318Lys variant only), MLH1, MSH2, MSH3, MSH6, MUTYH, NBN, NF1, NF2, NTHL1, PALB2, PDGFRA, PHOX2B, PMS2, POLD1, POLE, POT1, PRKAR1A, PTCH1, PTEN, RAD50, RAD51C, RAD51D, RB1, RECQL4, RET, RUNX1, SDHAF2, SDHA (sequence changes only), SDHB, SDHC, SDHD, SMAD4, SMARCA4, SMARCB1, SMARCE1, STK11, SUFU, TERC, TERT, TMEM127, TP53, TSC1, TSC2, VHL, WRN and WT1. ?  ? ? ?CHIEF COMPLIANT: Follow up breast cancer  on anastrozole ? ?INTERVAL HISTORY: Dawn Powers is a 76 y.o. with the above mention Estrogen receptor positive breast cancer. She presents to the clinic today for a follow-up. Denies hot flashes. She states that her skin is very dry from taking the anastrozole. Complain of ankle joint, tender pain at the breast site from the radiation.  She was diagnosed with osteoporosis but does not want to take bisphosphonate therapy ? ?ALLERGIES:  is allergic to morphine and related. ? ?MEDICATIONS:  ?Current Outpatient Medications  ?Medication Sig Dispense Refill  ? ALPRAZolam (XANAX) 0.25 MG tablet Take 1 tablet (0.25 mg total) by mouth 2 (two) times daily as needed for anxiety. 20 tablet 0  ? anastrozole (ARIMIDEX) 1 MG tablet Take 1 tablet (1 mg total) by mouth daily. 90 tablet 4  ? cholecalciferol (VITAMIN D3) 25 MCG (1000 UNIT) tablet Take 2 tablets (2,000 Units total) by mouth daily.    ? Cyanocobalamin (VITAMIN B 12) 500 MCG TABS Take by mouth.    ? HYDROcodone-acetaminophen (NORCO/VICODIN) 5-325 MG tablet TAKE 1 OR 2 TABLETS EVERY 6 HOURS AS NEEDED FOR PAIN. 90 tablet 0  ? losartan (COZAAR) 50  MG tablet Take 1 tablet (50 mg total) by mouth daily. 135 tablet 3  ? traZODone (DESYREL) 50 MG tablet Take 0.5 tablets (25 mg total) by mouth at bedtime as needed for sleep. 135 tablet 1   ? ?No current facility-administered medications for this visit.  ? ? ?PHYSICAL EXAMINATION: ?ECOG PERFORMANCE STATUS: 1 - Symptomatic but completely ambulatory ? ?Vitals:  ? 01/16/22 0854  ?BP: (!) 149/74  ?Pulse: 66  ?Resp: 16  ?Temp: 97.7 ?F (36.5 ?C)  ?SpO2: 100%  ? ?Filed Weights  ? 01/16/22 0854  ?Weight: 125 lb (56.7 kg)  ? ? ?BREAST: No palpable masses or nodules in either right or left breasts. No palpable axillary supraclavicular or infraclavicular adenopathy there is palpable tenderness in the left breast along the scar tissue. (exam performed in the presence of a chaperone) ? ?LABORATORY DATA:  ?I have reviewed the data as listed ? ?  Latest Ref Rng & Units 01/14/2022  ? 10:00 AM 05/08/2021  ?  1:07 PM 12/04/2020  ? 11:10 AM  ?CMP  ?Glucose 70 - 99 mg/dL 90   89   98    ?BUN 6 - 23 mg/dL $Remove'21   16   14    'Kjxgpkq$ ?Creatinine 0.40 - 1.20 mg/dL 0.97   0.97   0.89    ?Sodium 135 - 145 mEq/L 140   141   136    ?Potassium 3.5 - 5.1 mEq/L 5.1   3.8   4.2    ?Chloride 96 - 112 mEq/L 102   103   102    ?CO2 19 - 32 mEq/L 32   27   26    ?Calcium 8.4 - 10.5 mg/dL 9.7   10.1   9.7    ?Total Protein 6.0 - 8.3 g/dL 6.8   7.4   7.3    ?Total Bilirubin 0.2 - 1.2 mg/dL 0.6   0.6   0.4    ?Alkaline Phos 39 - 117 U/L 54   64   60    ?AST 0 - 37 U/L $Remo'19   19   18    'ZyEbh$ ?ALT 0 - 35 U/L $Remo'14   13   11    'rdcTG$ ? ? ?Lab Results  ?Component Value Date  ? WBC 3.7 (L) 01/14/2022  ? HGB 13.4 01/14/2022  ? HCT 38.8 01/14/2022  ? MCV 99.1 01/14/2022  ? PLT 226.0 01/14/2022  ? NEUTROABS 3.5 05/08/2021  ? ? ?ASSESSMENT & PLAN:  ?Malignant neoplasm of upper-outer quadrant of left breast in female, estrogen receptor positive ([place]) ?09/19/2020: Right breast biopsy: T1CN0 stage Ia grade 3 IDC ER/PR positive, HER2 equal vocal, FISH negative, Ki-67 10% (genetics negative) ?10/19/2020: Left lumpectomy: Grade 2 IDC negative margins (Oncotype DX: Score 15: 4% ROR) ?12/20/2020-01/10/2021: Adjuvant radiation ? ?Current treatment: Anastrozole started neoadjuvantly  09/21/2020 ?Anastrozole toxicities: ?Severely dry skin ?Joint stiffness and achiness ? ?Osteoporosis: T score -3.6: Patient does not want to take any bisphosphonate therapy because she has heard horror stories about side effects.  I discussed with her that the risk of fracture is real and could impair her quality of life.  She understands all of this and decided not to initiate bisphosphonate therapy.  She does take dietary calcium and vitamin D supplement ? ?Breast cancer surveillance: ?1.  Breast exam 01/16/2022: Benign ?2. mammogram 08/30/2021: Benign breast density category C ? ?Return to clinic in 1 year for follow-up ? ? ? ?No orders of the defined types were placed in this encounter. ? ?  The patient has a good understanding of the overall plan. she agrees with it. she will call with any problems that may develop before the next visit here. ?Total time spent: 30 mins including face to face time and time spent for planning, charting and co-ordination of care ? ? Harriette Ohara, MD ?01/16/22 ? ? ? I Gardiner Coins am scribing for Dr. Lindi Adie ? ?I have reviewed the above documentation for accuracy and completeness, and I agree with the above. ?  ?

## 2022-01-06 NOTE — Progress Notes (Signed)
Therapist, music at [facility] ?[address], [address] ?[place], [place] [address] ?336 [phone] ?Fax [phone] ? ?Date:  01/14/2022  ? ?Name:  Dawn Powers   DOB:  04-21-1946   MRN:  [id number] ? ?PCP:  Darreld Mclean, MD  ? ? ?Chief Complaint: 6 month follow up (Concerns/ questions:  1. Pt says she is traveling soon and would like something for this. 2. She has glass in her foot that she would like like checked. 3. Vitamins checked with her labs. 4. Would like her ears checked. 5. Cramping in her legs at night. /) ? ? ?History of Present Illness: ? ?Dawn Powers is a 76 y.o. very pleasant female patient who presents with the following: ? ?Pt seen today for periodic follow-up ?Last seen by myself in October - history of pre-diabetes, HTN, endometrial cancer s/p hyst 2016, chronic back pain, breast cancer dx 12/21 ? ?Taking anastazole for 5+ years  ?At our last visit she was thinking about options for her osteoporosis but had concerns about osteonecrosis of the jaw-so far she has chosen not to start a medication for treatment of osteoporosis ? ?Could use tetanus booster today ?She is taking just 50 mg of losartan- had been on 75 mg-her blood pressure has generally been okay on 50 mg ? ?She has not had covid as of yet as far as she knows  ?She stepped on a piece of glass in the right foot a while ago- she then seemed to develop a corn and is using salicylic acid treatment. The corn is coming off and seems to be doing well -the area is no longer tender.  She thinks the glass may work its way out ?Her ears crackle in the am but this resolves when she is up and about ? ?She is traveling to [place] for a month coming up- she is a bit anxious about the flight and if she might get back spasms on the plane which can be very painful ?She uses hydrocodone on occasion for this- last rx given in December  ?She notes hydrocodone works great for her spasms, she tries to use as infrequently as  possible ? ?She is taking her B12 QOD now as her B12 level was high last time  ? ?She takes trazodone as needed at night- she tries to take a 1/2 tablet only ? ? ?Lab Results  ?Component Value Date  ? HGBA1C 5.7 07/16/2021  ? ?BP Readings from Last 3 Encounters:  ?01/14/22 (!) 142/76  ?08/22/21 (!) 141/65  ?07/16/21 132/80  ? ? ? ?Patient Active Problem List  ? Diagnosis Date Noted  ? Osteoporosis 06/12/2021  ? Genetic testing 12/18/2020  ? Family history of prostate cancer   ? Family history of breast cancer   ? Family history of kidney cancer   ? Malignant neoplasm of upper-outer quadrant of left breast in female, estrogen receptor positive ([place]) 09/21/2020  ? Prediabetes 07/02/2019  ? S/P shoulder replacement, 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  ? Endometrial cancer ([place]) 09/26/2015  ? Essential hypertension 05/23/2015  ? Thoracic facet joint syndrome 04/27/2014  ? Back pain 03/24/2014  ? Muscle spasm 03/24/2014  ? Scoliosis (and kyphoscoliosis), idiopathic 12/22/2012  ? Osteopenia 12/22/2012  ? Other and unspecified hyperlipidemia 12/22/2012  ? ? ?Past Medical History:  ?Diagnosis Date  ? Anxiety   ? Arthritis   ? Breast cancer ([place]) 09/19/2020  ? left breast [facility] w/ radiation no chemo  ? Cancer [facility])   ?  Endometrial   ? Family history of breast cancer   ? Family history of kidney cancer   ? Family history of prostate cancer   ? Heart murmur   ? Hyperlipidemia   ? Hypertension   ? Neuromuscular disorder ([place])   ? DDD  ? Osteoporosis 06/12/2021  ? Pain   ? lower back -hx "DDD"  ? Personal history of radiation therapy   ? left [facility] 08/2020 w/ radiation no chemo  ? Radiation 11/15/15, 11/23/15, 11/30/15, 12/07/15, 12/14/15  ? HDR brachytherapy  ? ? ?Past Surgical History:  ?Procedure Laterality Date  ? ABDOMINAL HYSTERECTOMY    ? BREAST LUMPECTOMY Left 10/19/2018  ? left lumpectomy 10-19-20  ? BREAST LUMPECTOMY WITH RADIOACTIVE SEED AND SENTINEL LYMPH NODE BIOPSY Left 10/19/2020  ? Procedure: LEFT BREAST LUMPECTOMY WITH RADIOACTIVE SEED AND  SENTINEL LYMPH NODE BIOPSY;  Surgeon: Stark Klein, MD;  Location: [facility];  Service: General;  Laterality: Left;  PEC BLOCK, RNFA  ? CATARACT EXTRACTION Bilateral   ? COLONOSCOPY    ? FRACTURE SURGERY Right   ? ankle  ? GANGLION CYST EXCISION    ? LYMPH NODE BIOPSY N/A 09/26/2015  ? Procedure: SENTINEL LYMPH NODE BIOPSY;  Surgeon: Everitt Amber, MD;  Location: [facility];  Service: Gynecology;  Laterality: N/A;  ? ROBOTIC ASSISTED TOTAL HYSTERECTOMY WITH BILATERAL SALPINGO OOPHERECTOMY Bilateral 09/26/2015  ? Procedure: ROBOTIC ASSISTED TOTAL HYSTERECTOMY WITH BILATERAL SALPINGO OOPHORECTOMY;  Surgeon: Everitt Amber, MD;  Location: [facility];  Service: Gynecology;  Laterality: Bilateral;  ? TOTAL SHOULDER ARTHROPLASTY Left 09/10/2018  ? Procedure: TOTAL SHOULDER ARTHROPLASTY;  Surgeon: Justice Britain, MD;  Location: [facility];  Service: Orthopedics;  Laterality: Left;  160mn  ? WISDOM TOOTH EXTRACTION    ? WISDOM TOOTH EXTRACTION    ? ? ?Social History  ? ?Tobacco Use  ? Smoking status: Former  ?  Years: 4.00  ?  Types: Cigarettes  ?  Quit date: 09/30/1998  ?  Years since quitting: 23.3  ? Smokeless tobacco: Never  ?Vaping Use  ? Vaping Use: Never used  ?Substance Use Topics  ? Alcohol use: Yes  ?  Alcohol/week: 3.0 standard drinks  ?  Types: 3 Standard drinks or equivalent per week  ?  Comment: 5 drinks per week  ? Drug use: No  ? ? ?Family History  ?Problem Relation Age of Onset  ? Heart disease Mother   ? Alcohol abuse Mother   ?     breast cancer - questionable   ? Dementia Mother   ? Alcohol abuse Father   ? Prostate cancer Father 653 ?     prostate and colon- unsure of primary   ? Kidney cancer Brother 72 ? Alcohol abuse Maternal Grandfather   ? Alcohol abuse Sister   ? Basal cell carcinoma Sister   ? Breast cancer Cousin   ?     double mastectomy, d. 582 ? Prostate cancer Brother 777 ? Esophageal cancer Neg Hx   ? Stomach cancer Neg Hx   ? Rectal cancer Neg Hx   ? ? ?Allergies  ?Allergen Reactions  ?  Morphine And Related Hives  ? ? ?Medication list has been reviewed and updated. ? ?Current Outpatient Medications on File Prior to Visit  ?Medication Sig Dispense Refill  ? anastrozole (ARIMIDEX) 1 MG tablet Take 1 tablet (1 mg total) by mouth daily. 90 tablet 0  ? chlorhexidine (PERIDEX) 0.12 % solution SMARTSIG:10 By Mouth Twice Daily    ?  cholecalciferol (VITAMIN D3) 25 MCG (1000 UNIT) tablet Take 2 tablets (2,000 Units total) by mouth daily.    ? Cyanocobalamin (VITAMIN B 12) 500 MCG TABS Take by mouth.    ? HYDROcodone-acetaminophen (NORCO/VICODIN) 5-325 MG tablet TAKE 1 OR 2 TABLETS EVERY 6 HOURS AS NEEDED FOR PAIN. 90 tablet 0  ? losartan (COZAAR) 50 MG tablet Take 1.5 tablets (75 mg total) by mouth daily. (Patient taking differently: Take 75 mg by mouth daily. 1 TAB) 135 tablet 3  ? traZODone (DESYREL) 50 MG tablet TAKE 1 AND 1/2 TABLETS AT BEDTIME AS NEEDED FOR SLEEP. (Patient taking differently: Taking 1/2 tab) 135 tablet 1  ? ?No current facility-administered medications on file prior to visit.  ? ? ?Review of Systems: ? ?As per HPI- otherwise negative. ? ? ?Physical Examination: ?Vitals:  ? 01/14/22 0925  ?BP: (!) 142/76  ?Pulse: (!) 55  ?Resp: 18  ?Temp: 97.9 ?F (36.6 ?C)  ?SpO2: 99%  ? ?Vitals:  ? 01/14/22 0925  ?Weight: 126 lb (57.2 kg)  ?Height: '5\' 5"'$  (1.651 m)  ? ?Body mass index is 20.97 kg/m?. ?Ideal Body Weight: Weight in (lb) to have BMI = 25: 149.9 ? ?GEN: no acute distress.  Normal weight, looks well ?HEENT: Atraumatic, Normocephalic.  Bilateral TM wnl, oropharynx normal.  PEERL,EOMI. there is a small amount of earwax bilaterally, not obstructing. ?Ears and Nose: No external deformity. ?CV: RRR, No M/G/R. No JVD. No thrill. No extra heart sounds. ?PULM: CTA B, no wheezes, crackles, rhonchi. No retractions. No resp. distress. No accessory muscle use. ?ABD: S, NT, ND. No rebound. No HSM. ?EXTR: No c/c/e ?PSYCH: Normally interactive. Conversant.  ?Right foot: The patient has been using a corn pad  and has nearly resolved a corn towards the heel of the right foot.  I am not able to palpate any glass or other mass in the foot, it is not tender.  Her corn treatment has caused a small break in the skin-no need for wound

## 2022-01-09 ENCOUNTER — Ambulatory Visit: Payer: Medicare Other | Admitting: Hematology and Oncology

## 2022-01-14 ENCOUNTER — Encounter: Payer: Self-pay | Admitting: Family Medicine

## 2022-01-14 ENCOUNTER — Ambulatory Visit (INDEPENDENT_AMBULATORY_CARE_PROVIDER_SITE_OTHER): Payer: Medicare Other | Admitting: Family Medicine

## 2022-01-14 VITALS — BP 142/76 | HR 55 | Temp 97.9°F | Resp 18 | Ht 65.0 in | Wt 126.0 lb

## 2022-01-14 DIAGNOSIS — E538 Deficiency of other specified B group vitamins: Secondary | ICD-10-CM

## 2022-01-14 DIAGNOSIS — R7303 Prediabetes: Secondary | ICD-10-CM | POA: Diagnosis not present

## 2022-01-14 DIAGNOSIS — I10 Essential (primary) hypertension: Secondary | ICD-10-CM | POA: Diagnosis not present

## 2022-01-14 DIAGNOSIS — S91301A Unspecified open wound, right foot, initial encounter: Secondary | ICD-10-CM | POA: Diagnosis not present

## 2022-01-14 DIAGNOSIS — Z1329 Encounter for screening for other suspected endocrine disorder: Secondary | ICD-10-CM | POA: Diagnosis not present

## 2022-01-14 DIAGNOSIS — Z23 Encounter for immunization: Secondary | ICD-10-CM

## 2022-01-14 DIAGNOSIS — M810 Age-related osteoporosis without current pathological fracture: Secondary | ICD-10-CM | POA: Diagnosis not present

## 2022-01-14 DIAGNOSIS — F40243 Fear of flying: Secondary | ICD-10-CM | POA: Diagnosis not present

## 2022-01-14 DIAGNOSIS — M62838 Other muscle spasm: Secondary | ICD-10-CM | POA: Diagnosis not present

## 2022-01-14 DIAGNOSIS — Z5181 Encounter for therapeutic drug level monitoring: Secondary | ICD-10-CM | POA: Diagnosis not present

## 2022-01-14 DIAGNOSIS — E785 Hyperlipidemia, unspecified: Secondary | ICD-10-CM | POA: Diagnosis not present

## 2022-01-14 DIAGNOSIS — E559 Vitamin D deficiency, unspecified: Secondary | ICD-10-CM | POA: Diagnosis not present

## 2022-01-14 LAB — LIPID PANEL
Cholesterol: 234 mg/dL — ABNORMAL HIGH (ref 0–200)
HDL: 89.2 mg/dL (ref >39.00)
LDL Cholesterol: 130 mg/dL — ABNORMAL HIGH (ref 0–99)
NonHDL: 145.11
Total CHOL/HDL Ratio: 3
Triglycerides: 78 mg/dL (ref 0.0–149.0)
VLDL: 15.6 mg/dL (ref 0.0–40.0)

## 2022-01-14 LAB — COMPREHENSIVE METABOLIC PANEL
ALT: 14 U/L (ref 0–35)
AST: 19 U/L (ref 0–37)
Albumin: 4.6 g/dL (ref 3.5–5.2)
Alkaline Phosphatase: 54 U/L (ref 39–117)
BUN: 21 mg/dL (ref 6–23)
CO2: 32 mEq/L (ref 19–32)
Calcium: 9.7 mg/dL (ref 8.4–10.5)
Chloride: 102 mEq/L (ref 96–112)
Creatinine, Ser: 0.97 mg/dL (ref 0.40–1.20)
GFR: 56.25 mL/min — ABNORMAL LOW (ref >60.00)
Glucose, Bld: 90 mg/dL (ref 70–99)
Potassium: 5.1 mEq/L (ref 3.5–5.1)
Sodium: 140 mEq/L (ref 135–145)
Total Bilirubin: 0.6 mg/dL (ref 0.2–1.2)
Total Protein: 6.8 g/dL (ref 6.0–8.3)

## 2022-01-14 LAB — CBC
HCT: 38.8 % (ref 36.0–46.0)
Hemoglobin: 13.4 g/dL (ref 12.0–15.0)
MCHC: 34.4 g/dL (ref 30.0–36.0)
MCV: 99.1 fl (ref 78.0–100.0)
Platelets: 226 10*3/uL (ref 150.0–400.0)
RBC: 3.92 Mil/uL (ref 3.87–5.11)
RDW: 12.2 % (ref 11.5–15.5)
WBC: 3.7 10*3/uL — ABNORMAL LOW (ref 4.0–10.5)

## 2022-01-14 LAB — TSH: TSH: 3.3 u[IU]/mL (ref 0.35–5.50)

## 2022-01-14 LAB — VITAMIN D 25 HYDROXY (VIT D DEFICIENCY, FRACTURES): VITD: 37.19 ng/mL (ref 30.00–100.00)

## 2022-01-14 LAB — VITAMIN B12: Vitamin B-12: 758 pg/mL (ref 211–911)

## 2022-01-14 MED ORDER — ALPRAZOLAM 0.25 MG PO TABS: 0.2500 mg | ORAL_TABLET | Freq: Two times a day (BID) | ORAL | 0 refills | Status: DC | PRN

## 2022-01-14 NOTE — Patient Instructions (Signed)
Good to see you today- have a wonderful time on your trip to [place]!  ?Tetanus booster given today ?Ok to use hydrocodone as needed for back spasm- continue to use as little as you can ?Xanax for flying- again, use as little as you can.  Avoid combining with alcohol/ hydrocodone. Can take with hydrocodone if an emergency only ? ?I will be in touch with your labs ?Please see me in about 6 months assuming all is well  ?

## 2022-01-16 ENCOUNTER — Other Ambulatory Visit: Payer: Self-pay

## 2022-01-16 ENCOUNTER — Inpatient Hospital Stay: Payer: Medicare Other | Attending: Hematology and Oncology | Admitting: Hematology and Oncology

## 2022-01-16 DIAGNOSIS — Z79811 Long term (current) use of aromatase inhibitors: Secondary | ICD-10-CM | POA: Insufficient documentation

## 2022-01-16 DIAGNOSIS — C50412 Malignant neoplasm of upper-outer quadrant of left female breast: Secondary | ICD-10-CM | POA: Diagnosis not present

## 2022-01-16 DIAGNOSIS — I10 Essential (primary) hypertension: Secondary | ICD-10-CM | POA: Diagnosis not present

## 2022-01-16 DIAGNOSIS — Z170 Estrogen receptor positive status [ER+]: Secondary | ICD-10-CM | POA: Diagnosis not present

## 2022-01-16 DIAGNOSIS — Z79899 Other long term (current) drug therapy: Secondary | ICD-10-CM | POA: Insufficient documentation

## 2022-01-16 DIAGNOSIS — G4700 Insomnia, unspecified: Secondary | ICD-10-CM

## 2022-01-16 DIAGNOSIS — M810 Age-related osteoporosis without current pathological fracture: Secondary | ICD-10-CM | POA: Insufficient documentation

## 2022-01-16 MED ORDER — TRAZODONE HCL 50 MG PO TABS: 25.0000 mg | ORAL_TABLET | Freq: Every evening | ORAL | 1 refills | Status: DC | PRN

## 2022-01-16 MED ORDER — LOSARTAN POTASSIUM 50 MG PO TABS: 50.0000 mg | ORAL_TABLET | Freq: Every day | ORAL | 3 refills | Status: DC

## 2022-01-16 MED ORDER — ANASTROZOLE 1 MG PO TABS: 1.0000 mg | ORAL_TABLET | Freq: Every day | ORAL | 4 refills | Status: DC

## 2022-01-16 NOTE — Assessment & Plan Note (Signed)
09/19/2020: Right breast biopsy: T1CN0 stage Ia grade 3 IDC ER/PR positive, HER2 equal vocal, FISH negative, Ki-67 10% (genetics negative) ?10/19/2020: Left lumpectomy: Grade 2 IDC negative margins (Oncotype DX: Score 15: 4% ROR) ?12/20/2020-01/10/2021: Adjuvant radiation ? ?Current treatment: Anastrozole started neoadjuvantly 09/21/2020 ?Anastrozole toxicities: ? ?Breast cancer surveillance: ?1.  Breast exam 01/16/2022: Benign ?2. mammogram 08/30/2021: Benign breast density category C ? ?Return to clinic in 1 year for follow-up ? ?

## 2022-01-17 ENCOUNTER — Telehealth: Payer: Self-pay | Admitting: Family Medicine

## 2022-01-17 ENCOUNTER — Encounter: Payer: Self-pay | Admitting: Family Medicine

## 2022-01-17 LAB — DRUG MONITORING, PANEL 8 WITH CONFIRMATION, URINE
6 Acetylmorphine: NEGATIVE ng/mL (ref <10)
Alcohol Metabolites: NEGATIVE ng/mL (ref <500)
Amphetamines: NEGATIVE ng/mL (ref <500)
Benzodiazepines: NEGATIVE ng/mL (ref <100)
Buprenorphine, Urine: NEGATIVE ng/mL (ref <5)
Cocaine Metabolite: NEGATIVE ng/mL (ref <150)
Codeine: NEGATIVE ng/mL (ref <50)
Creatinine: 50 mg/dL (ref >=20.0)
Hydrocodone: NEGATIVE ng/mL (ref <50)
Hydromorphone: 123 ng/mL — ABNORMAL HIGH (ref <50)
MDMA: NEGATIVE ng/mL (ref <500)
Marijuana Metabolite: NEGATIVE ng/mL (ref <20)
Morphine: NEGATIVE ng/mL (ref <50)
Norhydrocodone: 146 ng/mL — ABNORMAL HIGH (ref <50)
Opiates: POSITIVE ng/mL — AB (ref <100)
Oxidant: NEGATIVE ug/mL (ref <200)
Oxycodone: NEGATIVE ng/mL (ref <100)
pH: 7.2 (ref 4.5–9.0)

## 2022-01-17 LAB — DM TEMPLATE

## 2022-01-17 NOTE — Telephone Encounter (Signed)
Please advise on booster.  ? ?Billing has been sent to [facility].  ?

## 2022-01-17 NOTE — Telephone Encounter (Addendum)
Sent MyChart message regarding booster ?

## 2022-01-17 NOTE — Telephone Encounter (Signed)
Pt would like to know if Dr.[name] recommends getting the covid booster before her trip to [place] this summer. She also wanted to mention she was charged for her awv back on 11/23/21 and Dr.Wendling was listed as referring provider. She stated medicare told her it would be denied, as it is "once in a lifetime" and she would be charged $400. She would like to know if someone could look into this for her. Advised pt a message would be sent to [facility] regarding bootser, and would make supervisor aware of billing issue. Also transferred pt to billing so they could look into it as well.  ?

## 2022-01-22 ENCOUNTER — Encounter (HOSPITAL_COMMUNITY): Payer: Self-pay

## 2022-01-31 DIAGNOSIS — Z23 Encounter for immunization: Secondary | ICD-10-CM | POA: Diagnosis not present

## 2022-02-11 NOTE — Telephone Encounter (Signed)
As of 02/04/2022, it looks like the final payment for this DOS was received from Medicare.  Pt should have a zero balance now regarding this billing concern.  Should she have any further questions, she can contact the billing office.  ?

## 2022-04-26 ENCOUNTER — Other Ambulatory Visit: Payer: Self-pay | Admitting: Family Medicine

## 2022-04-26 DIAGNOSIS — I10 Essential (primary) hypertension: Secondary | ICD-10-CM

## 2022-04-29 DIAGNOSIS — Z961 Presence of intraocular lens: Secondary | ICD-10-CM | POA: Diagnosis not present

## 2022-04-29 DIAGNOSIS — H18453 Nodular corneal degeneration, bilateral: Secondary | ICD-10-CM | POA: Diagnosis not present

## 2022-04-29 DIAGNOSIS — H04123 Dry eye syndrome of bilateral lacrimal glands: Secondary | ICD-10-CM | POA: Diagnosis not present

## 2022-04-29 DIAGNOSIS — H0102B Squamous blepharitis left eye, upper and lower eyelids: Secondary | ICD-10-CM | POA: Diagnosis not present

## 2022-04-29 DIAGNOSIS — H16141 Punctate keratitis, right eye: Secondary | ICD-10-CM | POA: Diagnosis not present

## 2022-04-29 DIAGNOSIS — H0102A Squamous blepharitis right eye, upper and lower eyelids: Secondary | ICD-10-CM | POA: Diagnosis not present

## 2022-04-29 DIAGNOSIS — G43B0 Ophthalmoplegic migraine, not intractable: Secondary | ICD-10-CM | POA: Diagnosis not present

## 2022-04-29 DIAGNOSIS — H10413 Chronic giant papillary conjunctivitis, bilateral: Secondary | ICD-10-CM | POA: Diagnosis not present

## 2022-05-07 ENCOUNTER — Other Ambulatory Visit: Payer: Self-pay | Admitting: Family Medicine

## 2022-05-07 DIAGNOSIS — R928 Other abnormal and inconclusive findings on diagnostic imaging of breast: Secondary | ICD-10-CM

## 2022-06-04 ENCOUNTER — Other Ambulatory Visit: Payer: Self-pay | Admitting: Family Medicine

## 2022-06-04 DIAGNOSIS — G894 Chronic pain syndrome: Secondary | ICD-10-CM

## 2022-06-05 ENCOUNTER — Telehealth: Payer: Self-pay | Admitting: Family Medicine

## 2022-06-05 DIAGNOSIS — G894 Chronic pain syndrome: Secondary | ICD-10-CM

## 2022-06-05 MED ORDER — HYDROCODONE-ACETAMINOPHEN 5-325 MG PO TABS: ORAL_TABLET | ORAL | 0 refills | Status: DC

## 2022-06-05 NOTE — Telephone Encounter (Signed)
Medication: HYDROcodone-acetaminophen (NORCO/VICODIN) 5-325 MG tablet   Has the patient contacted their pharmacy? Yes.    Preferred Pharmacy:  [facility], [place], [place] [address]  Phone:  [phone]  Fax:  [phone]

## 2022-06-05 NOTE — Telephone Encounter (Signed)
Okay for refill?  

## 2022-06-19 ENCOUNTER — Encounter: Payer: Self-pay | Admitting: Family Medicine

## 2022-06-20 ENCOUNTER — Encounter: Payer: Self-pay | Admitting: Family Medicine

## 2022-07-13 NOTE — Patient Instructions (Incomplete)
Good to see you today- recommend a dose of covid and RSV this fall if not done already  Assuming all is well please see me in about 6 months  Flu shot today I will be in touch with your labs asap Check on the bone density scan and let me know if you have any concerns with getting this done

## 2022-07-13 NOTE — Progress Notes (Unsigned)
[facility] [address] [place], [place] [address] [phone]  Date:  07/17/2022   Name:  Dawn Powers   DOB:  [birth year]   MRN:  [id number]  PCP:  Darreld Mclean, MD    Chief Complaint: No chief complaint on file.   History of Present Illness:  Dawn Powers is a 76 y.o. very pleasant female patient who presents with the following:  Pt seen today for an office visit  Last seen by myself in April - history of pre-diabetes, HTN, endometrial cancer s/p hyst 2016, chronic back pain, breast cancer dx 12/21, osteoporosis Last seen by her oncologist in April of this year, Dr. Bettey Costa 1 year  At this time she has chosen not to pursue treatment for osteoporosis I do treat her with hydrocodone for chronic back pain; she generally feels prescriptions less than allowed   Taking anastazole for 5+ years   Covid booster Flu shot  She is not currently on a statin, can offer coronary calcium  Losartan Anastrozole Trazodone at bedtime as needed Labs done in April-CMP, lipid, vitamin D and B12, CBC, thyroid  Patient Active Problem List   Diagnosis Date Noted   Osteoporosis 06/12/2021   Genetic testing 12/18/2020   Family history of prostate cancer    Family history of breast cancer    Family history of kidney cancer    Malignant neoplasm of upper-outer quadrant of left breast in female, estrogen receptor positive ([place]) 09/21/2020   Prediabetes 07/02/2019   S/P shoulder replacement, 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   Endometrial cancer ([place]) 09/26/2015   Essential hypertension 05/23/2015   Thoracic facet joint syndrome 04/27/2014   Back pain 03/24/2014   Muscle spasm 03/24/2014   Scoliosis (and kyphoscoliosis), idiopathic 12/22/2012   Osteopenia 12/22/2012   Other and unspecified hyperlipidemia 12/22/2012    Past Medical History:  Diagnosis Date   Anxiety    Arthritis    Breast cancer ([place]) 09/19/2020   left  breast [facility] w/ radiation no chemo   Cancer ([place])    Endometrial    Family history of breast cancer    Family history of kidney cancer    Family history of prostate cancer    Heart murmur    Hyperlipidemia    Hypertension    Neuromuscular disorder ([place])    DDD   Osteoporosis 06/12/2021   Pain    lower back -hx "DDD"   Personal history of radiation therapy    left [facility] 08/2020 w/ radiation no chemo   Radiation 11/15/15, 11/23/15, 11/30/15, 12/07/15, 12/14/15   HDR brachytherapy    Past Surgical History:  Procedure Laterality Date   ABDOMINAL HYSTERECTOMY     BREAST LUMPECTOMY Left 10/19/2018   left lumpectomy 10-19-20   BREAST LUMPECTOMY WITH RADIOACTIVE SEED AND SENTINEL LYMPH NODE BIOPSY Left 10/19/2020   Procedure: LEFT BREAST LUMPECTOMY WITH RADIOACTIVE SEED AND SENTINEL LYMPH NODE BIOPSY;  Surgeon: Stark Klein, MD;  Location: [place];  Service: General;  Laterality: Left;  PEC BLOCK, RNFA   CATARACT EXTRACTION Bilateral    COLONOSCOPY     FRACTURE SURGERY Right    ankle   GANGLION CYST EXCISION     LYMPH NODE BIOPSY N/A 09/26/2015   Procedure: SENTINEL LYMPH NODE BIOPSY;  Surgeon: Everitt Amber, MD;  Location: [facility];  Service: Gynecology;  Laterality: N/A;   ROBOTIC ASSISTED TOTAL HYSTERECTOMY WITH BILATERAL SALPINGO OOPHERECTOMY Bilateral 09/26/2015   Procedure: ROBOTIC  ASSISTED TOTAL HYSTERECTOMY WITH BILATERAL SALPINGO OOPHORECTOMY;  Surgeon: Everitt Amber, MD;  Location: [facility];  Service: Gynecology;  Laterality: Bilateral;   TOTAL SHOULDER ARTHROPLASTY Left 09/10/2018   Procedure: TOTAL SHOULDER ARTHROPLASTY;  Surgeon: Justice Britain, MD;  Location: [place];  Service: Orthopedics;  Laterality: Left;  168mn   WISDOM TOOTH EXTRACTION     WISDOM TOOTH EXTRACTION      Social History   Tobacco Use   Smoking status: Former    Years: 4.00    Types: Cigarettes    Quit date: 09/30/1998    Years since quitting: 23.8   Smokeless tobacco: Never  Vaping Use   Vaping Use:  Never used  Substance Use Topics   Alcohol use: Yes    Alcohol/week: 3.0 standard drinks of alcohol    Types: 3 Standard drinks or equivalent per week    Comment: 5 drinks per week   Drug use: No    Family History  Problem Relation Age of Onset   Heart disease Mother    Alcohol abuse Mother        breast cancer - questionable    Dementia Mother    Alcohol abuse Father    Prostate cancer Father 639      prostate and colon- unsure of primary    Kidney cancer Brother 722  Alcohol abuse Maternal Grandfather    Alcohol abuse Sister    Basal cell carcinoma Sister    Breast cancer Cousin        double mastectomy, d. 575  Prostate cancer Brother 774  Esophageal cancer Neg Hx    Stomach cancer Neg Hx    Rectal cancer Neg Hx     Allergies  Allergen Reactions   Morphine And Related Hives    Medication list has been reviewed and updated.  Current Outpatient Medications on File Prior to Visit  Medication Sig Dispense Refill   ALPRAZolam (XANAX) 0.25 MG tablet Take 1 tablet (0.25 mg total) by mouth 2 (two) times daily as needed for anxiety. 20 tablet 0   anastrozole (ARIMIDEX) 1 MG tablet Take 1 tablet (1 mg total) by mouth daily. 90 tablet 4   cholecalciferol (VITAMIN D3) 25 MCG (1000 UNIT) tablet Take 2 tablets (2,000 Units total) by mouth daily.     Cyanocobalamin (VITAMIN B 12) 500 MCG TABS Take by mouth.     HYDROcodone-acetaminophen (NORCO/VICODIN) 5-325 MG tablet TAKE 1 OR 2 TABLETS EVERY 6 HOURS AS NEEDED FOR PAIN. 90 tablet 0   losartan (COZAAR) 50 MG tablet Take 1 tablet (50 mg total) by mouth daily. 90 tablet 1   traZODone (DESYREL) 50 MG tablet Take 0.5 tablets (25 mg total) by mouth at bedtime as needed for sleep. 135 tablet 1   No current facility-administered medications on file prior to visit.    Review of Systems:  As per HPI- otherwise negative.   Physical Examination: There were no vitals filed for this visit. There were no vitals filed for this  visit. There is no height or weight on file to calculate BMI. Ideal Body Weight:    GEN: no acute distress. HEENT: Atraumatic, Normocephalic.  Ears and Nose: No external deformity. CV: RRR, No M/G/R. No JVD. No thrill. No extra heart sounds. PULM: CTA B, no wheezes, crackles, rhonchi. No retractions. No resp. distress. No accessory muscle use. ABD: S, NT, ND, +BS. No rebound. No HSM. EXTR: No c/c/e PSYCH: Normally interactive. Conversant.    Assessment and Plan: ***  Signed Lamar Blinks, MD

## 2022-07-14 IMAGING — US US BREAST CYST ASPIRATION 1ST CYST
1 series · 10 of 10 positions shown · non-contrast
Comparison: Previous exams.
COMPARISON: Previous exams.

Addendum:
CLINICAL DATA: Patient for ultrasound-guided aspiration of left
breast collection and left axillary complex collection.

EXAM:
ULTRASOUND GUIDED LEFT BREAST CYST ASPIRATION

[Series 1: us breast cyst aspiration 1st cyst · 0.07mm/px · 10 of 10 slices shown]
[im 1/10]
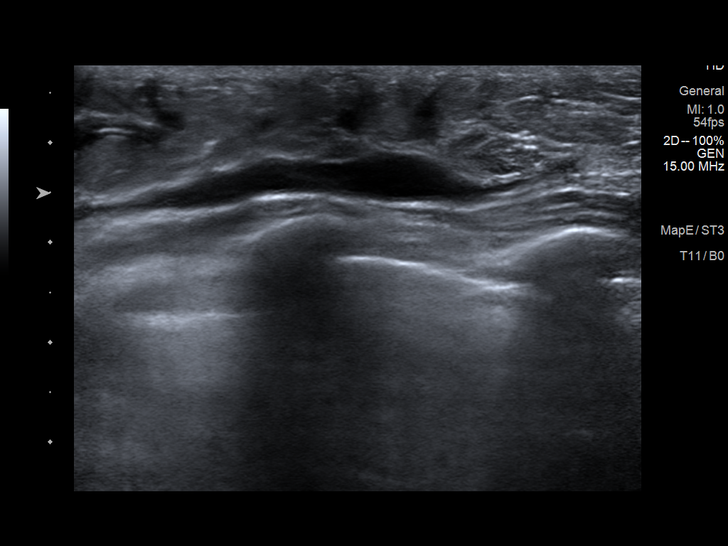
[im 2/10]
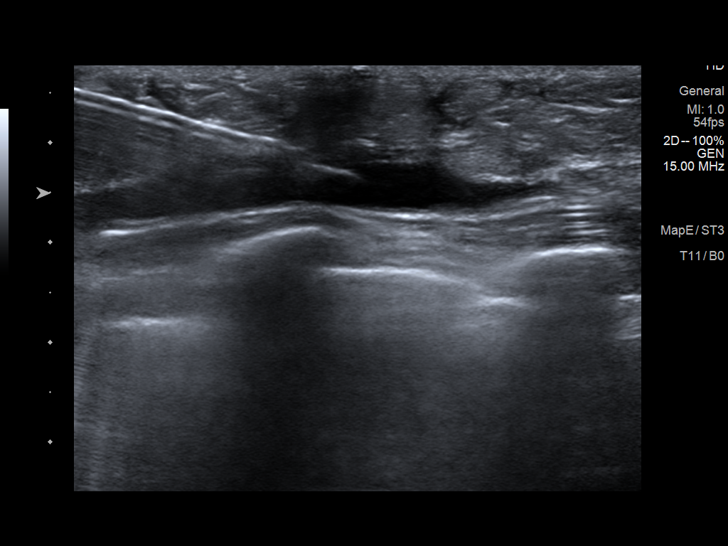
[im 3/10]
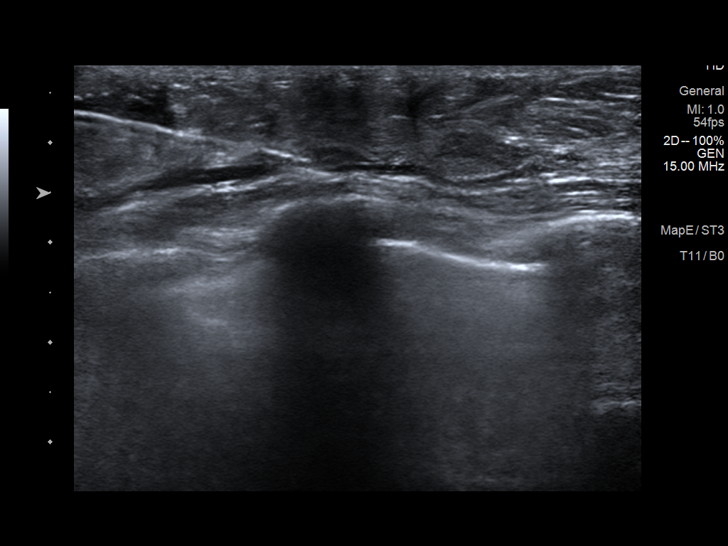
[im 4/10]
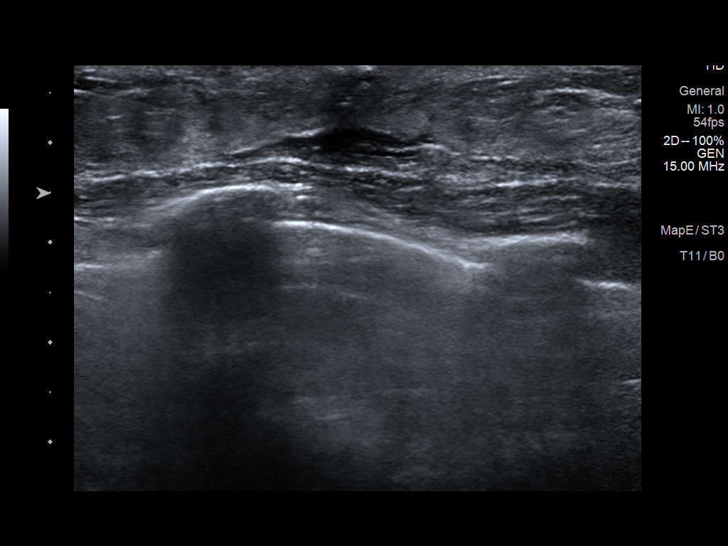
[im 5/10]
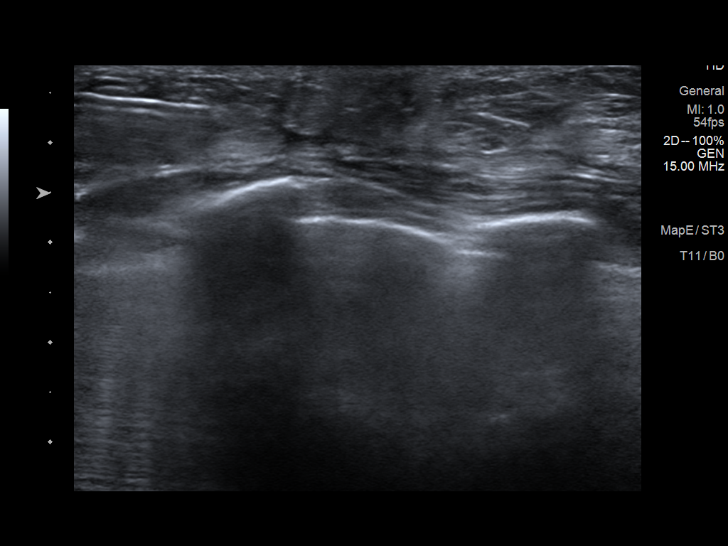
[im 6/10]
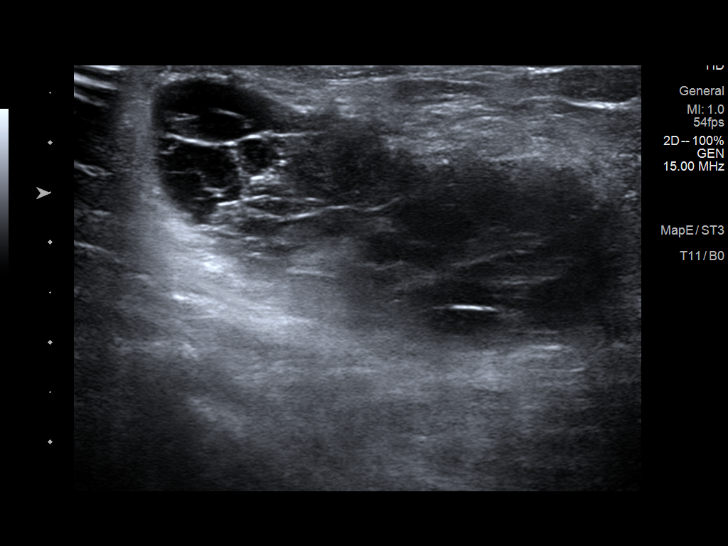
[im 7/10]
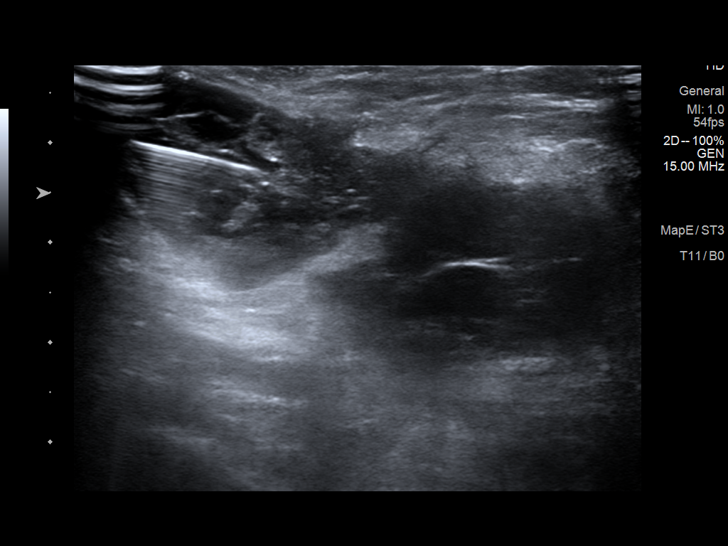
[im 8/10]
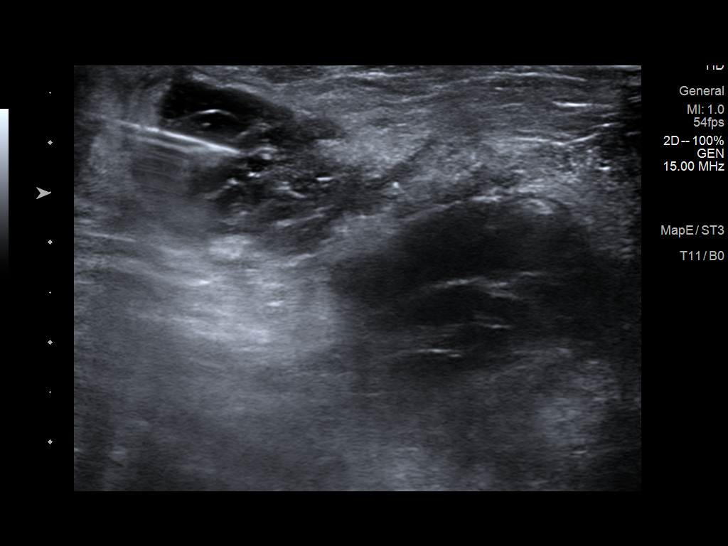
[im 9/10]
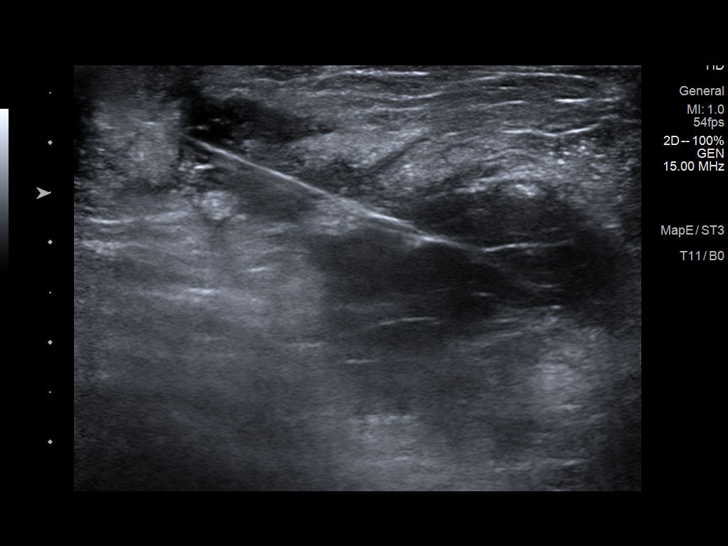
[im 10/10]
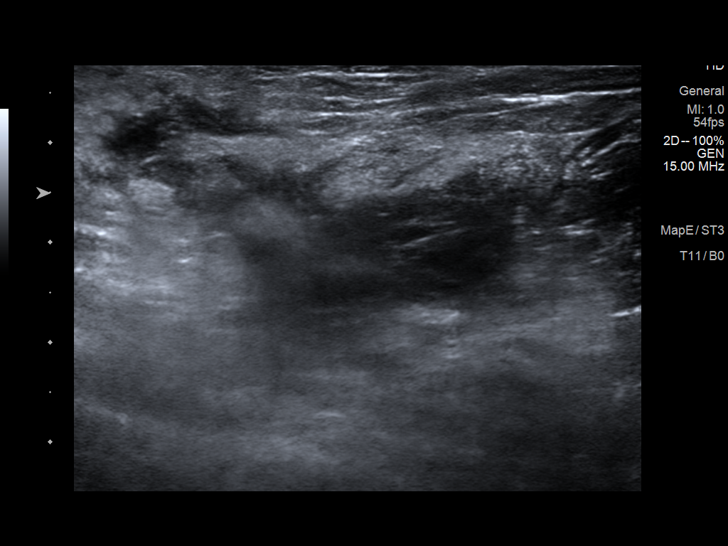

[10 of 10 positions shown; findings below may reference images not displayed]

PROCEDURE:
The patient and I discussed the procedure of ultrasound-guided
aspiration including benefits and alternatives. We discussed the
high likelihood of a successful procedure. We discussed the risks of
the procedure including infection, bleeding, tissue injury, and
inadequate sampling. Informed written consent was given. The usual
time out protocol was performed immediately prior to the procedure.

Site 1: Left breast 3 o'clock position

Using sterile technique, 1% lidocaine, under direct ultrasound
visualization, needle aspiration of collection left breast 3 o'clock
position was performed. Approximately 35 cc of thin sanguinous fluid
was aspirated. Fluid was sent to the lab for analysis.

Site 2: Left axilla.

Using sterile technique, 1% lidocaine, under direct ultrasound
visualization, needle aspiration of complex left axillary collection
was performed. Approximately 10 cc of thin sanguinous fluid was
aspirated.
IMPRESSION: Ultrasound-guided aspiration of left breast (3 o'clock) and left
axillary collections. No apparent complications.

RECOMMENDATIONS:
Continued clinical follow-up for suspected cellulitis and seromas
within the left breast. Patient can return for additional drainage
as clinically warranted.

ADDENDUM:
LEFT breast aspiration yielded Gram Stain-NO WBC SEEN, NO ORGANISMS
SEEN. Culture-No growth aerobically or anaerobically.

The patient was notified of results and her questions were answered.

The patient reported the affected area has less redness and feels
smaller. She was instructed to call with any additional questions or
concerns, or if symptoms worsen. My direct phone number was provided
to the patient.

The patient was instructed to continue with clinical follow-up as
needed, and to return for annual diagnostic mammography, due in
July 2021.

Pathology results reported by Geir Arne Tidemand, RN on 12/06/2020.

*** End of Addendum ***
PROCEDURE:
The patient and I discussed the procedure of ultrasound-guided
aspiration including benefits and alternatives. We discussed the
high likelihood of a successful procedure. We discussed the risks of
the procedure including infection, bleeding, tissue injury, and
inadequate sampling. Informed written consent was given. The usual
time out protocol was performed immediately prior to the procedure.

Site 1: Left breast 3 o'clock position

Using sterile technique, 1% lidocaine, under direct ultrasound
visualization, needle aspiration of collection left breast 3 o'clock
position was performed. Approximately 35 cc of thin sanguinous fluid
was aspirated. Fluid was sent to the lab for analysis.

Site 2: Left axilla.

Using sterile technique, 1% lidocaine, under direct ultrasound
visualization, needle aspiration of complex left axillary collection
was performed. Approximately 10 cc of thin sanguinous fluid was
aspirated.
IMPRESSION: Ultrasound-guided aspiration of left breast (3 o'clock) and left
axillary collections. No apparent complications.

RECOMMENDATIONS:
Continued clinical follow-up for suspected cellulitis and seromas
within the left breast. Patient can return for additional drainage
as clinically warranted.

## 2022-07-14 IMAGING — US US BREAST*L* LIMITED INC AXILLA
1 series · 13 of 13 positions shown · non-contrast
Comparison: Previous exam(s).

CLINICAL DATA: Patient with history of left lumpectomy and left
Patient now presents with cellulitis involving the outer left
breast. Evaluate for infection of the associated collections.

EXAM:
ULTRASOUND OF THE LEFT BREAST

[Series 1: us breast*left* limited inc axilla · 0.10mm/px · 13 acquisitions, 13 frames shown]
[im 1/13]
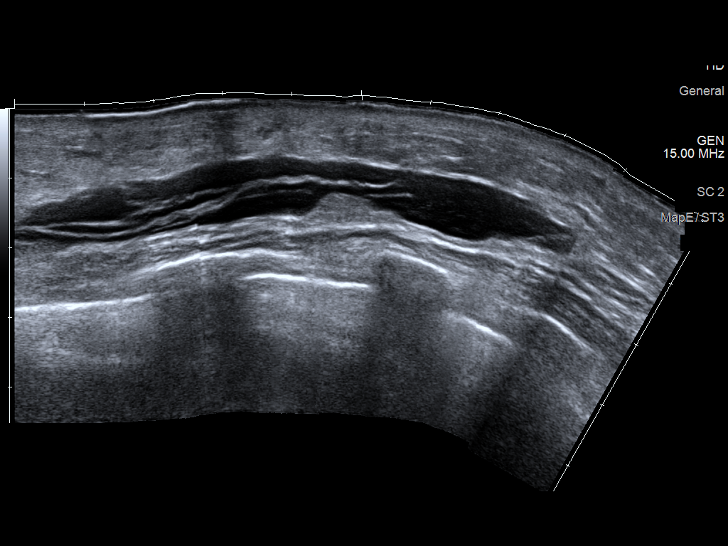
[im 2/13]
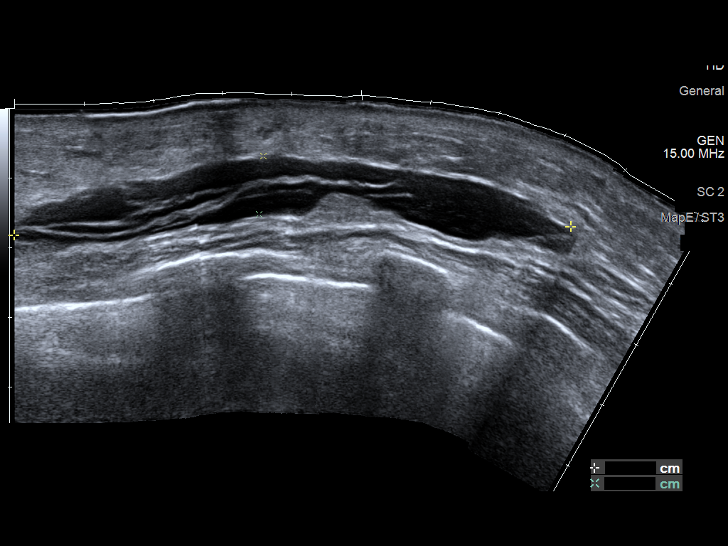
[im 3/13]
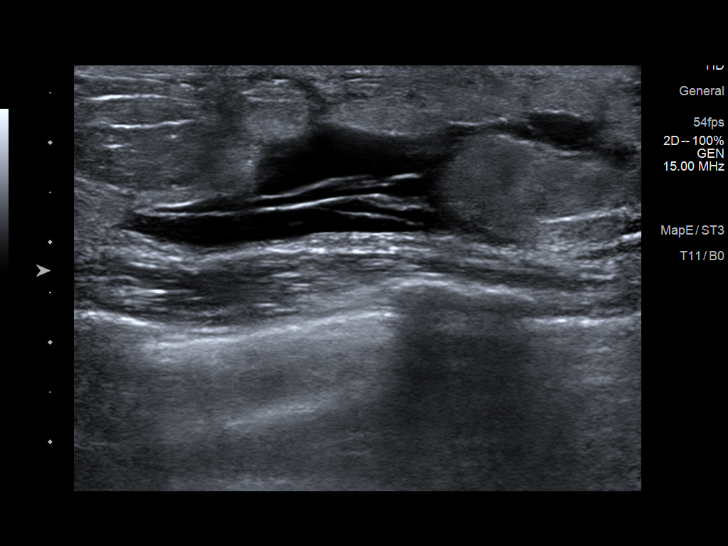
[im 4/13]
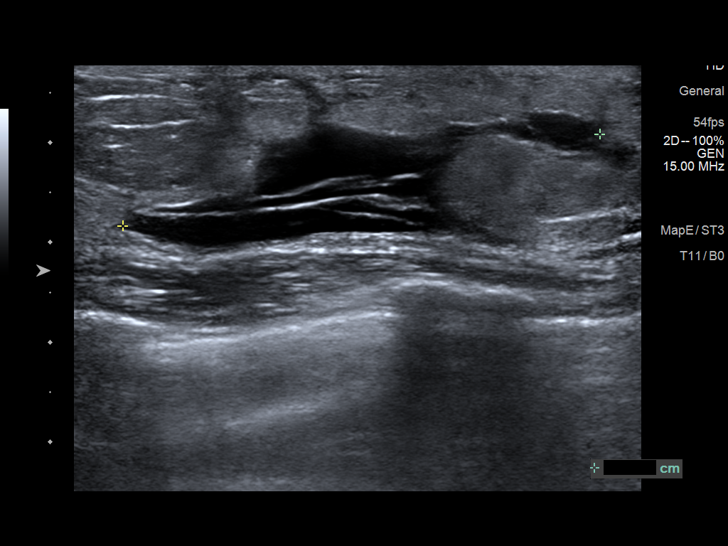
[im 5/13]
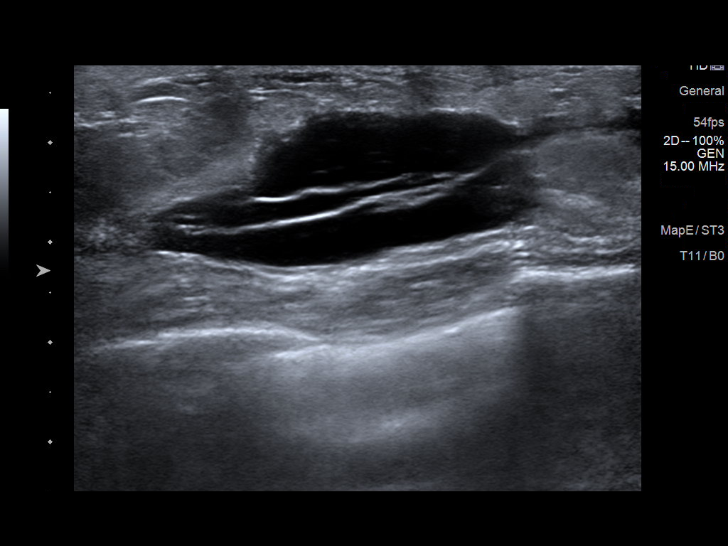
[im 6/13]
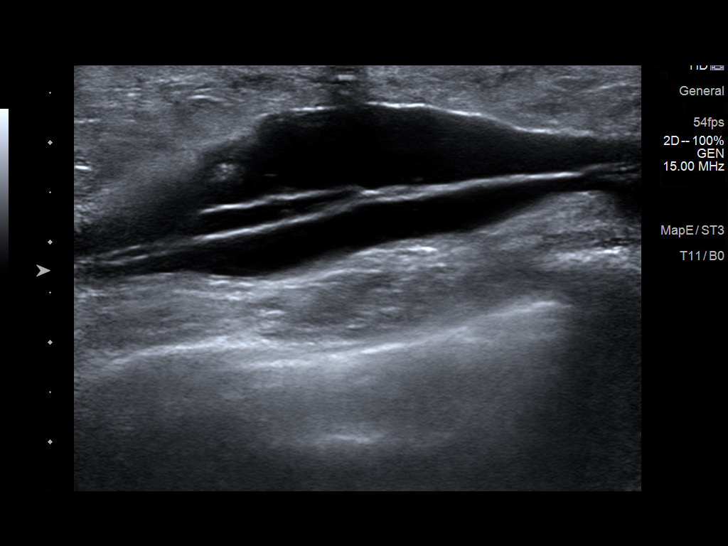
[im 7/13]
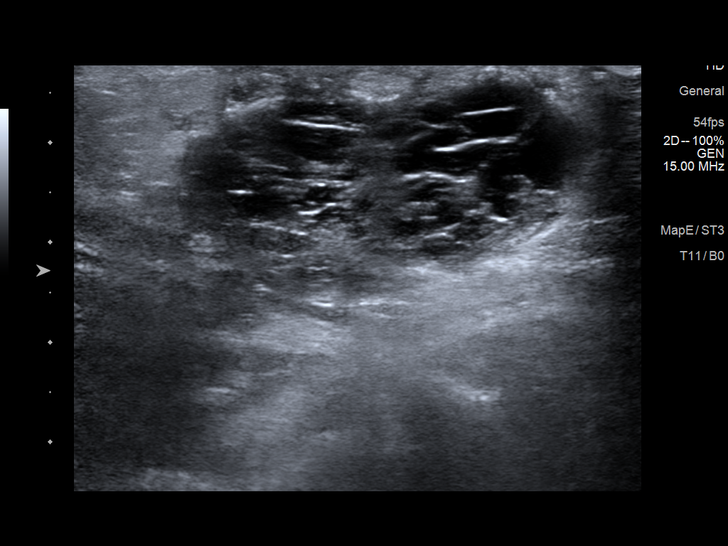
[im 8/13]
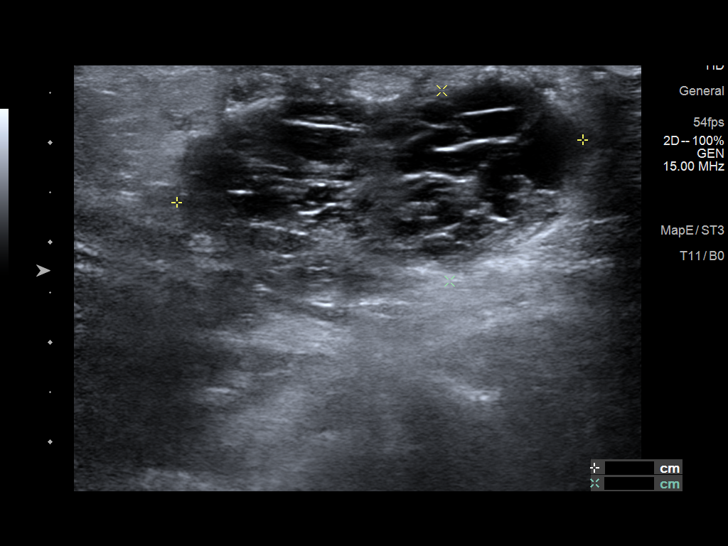
[im 9/13]
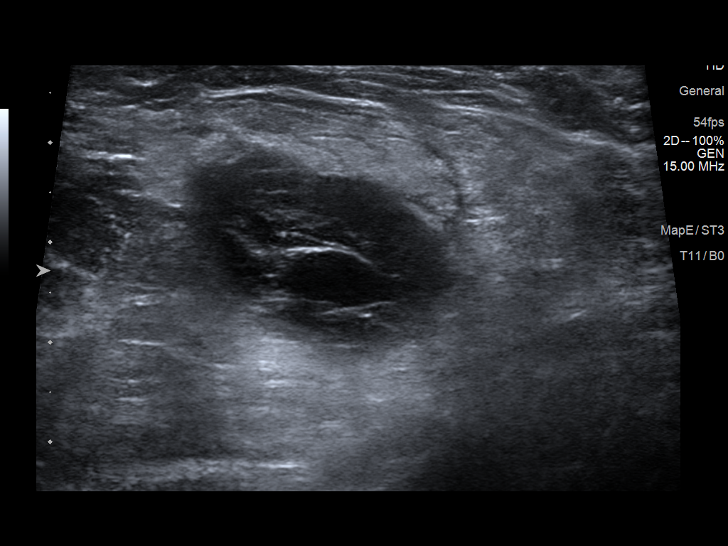
[im 10/13]
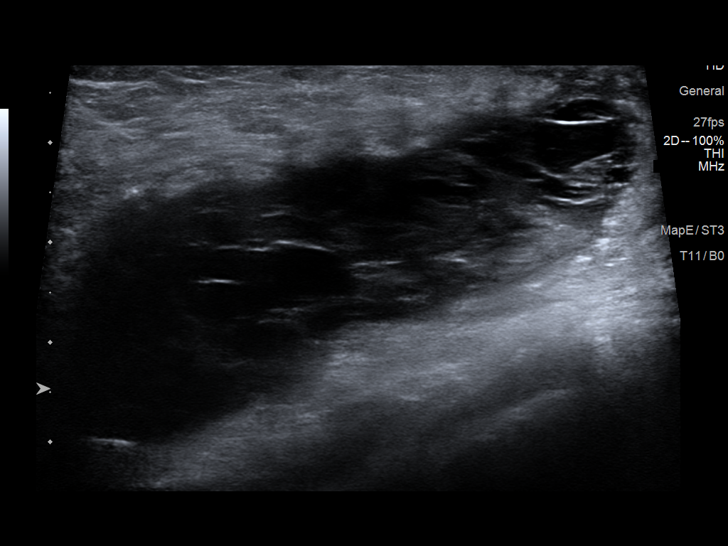
[im 11/13]
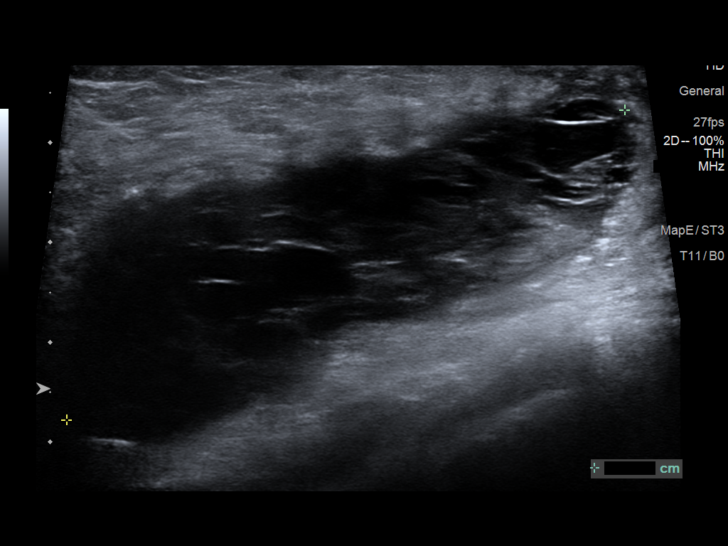
[im 12/13]
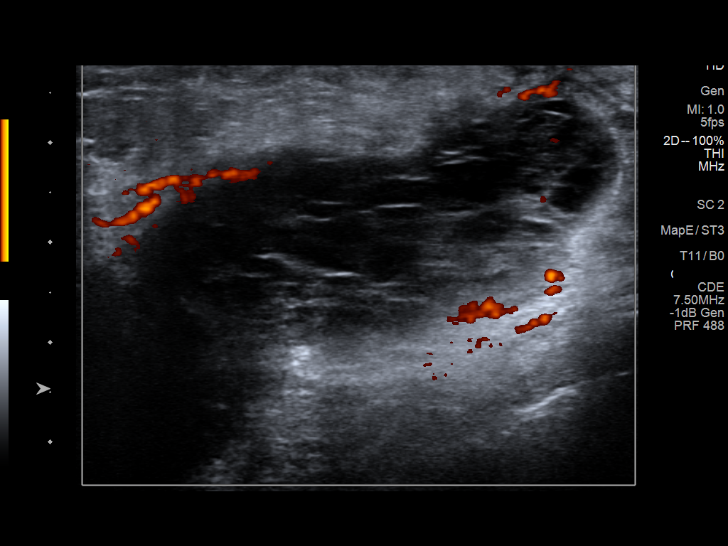
[im 13/13]
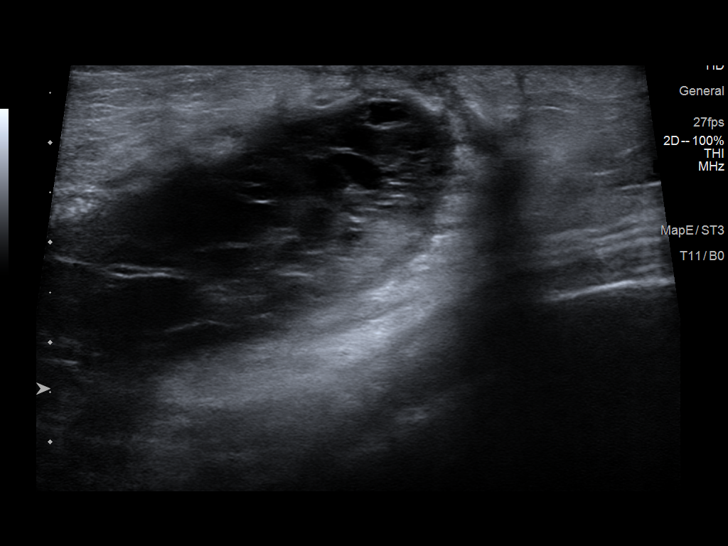

[13 of 13 positions shown; findings below may reference images not displayed]

FINDINGS: Targeted ultrasound is performed, showing a complex collection
within the left breast 3-4 o'clock position measuring up to 8 cm.
Additionally within the left axilla there is a 4.1 x 1.9 x 6.3 cm
complex collection.
IMPRESSION: Collection within the left breast 3-4 o'clock position and left
axilla. These likely represent seromas. Superimposed infection not
excluded.

RECOMMENDATION:
Ultrasound-guided aspiration of both collections within the left
breast 3 o'clock position and left axilla.

Continue antibiotic therapy.

Clinical follow-up until resolution.

I have discussed the findings and recommendations with the patient.
If applicable, a reminder letter will be sent to the patient
regarding the next appointment.

BI-RADS CATEGORY  2: Benign.

## 2022-07-17 ENCOUNTER — Ambulatory Visit (INDEPENDENT_AMBULATORY_CARE_PROVIDER_SITE_OTHER): Payer: Medicare Other | Admitting: Family Medicine

## 2022-07-17 ENCOUNTER — Encounter: Payer: Self-pay | Admitting: Family Medicine

## 2022-07-17 VITALS — BP 134/70 | HR 61 | Temp 98.0°F | Resp 16 | Ht 63.0 in | Wt 122.8 lb

## 2022-07-17 DIAGNOSIS — I10 Essential (primary) hypertension: Secondary | ICD-10-CM | POA: Diagnosis not present

## 2022-07-17 DIAGNOSIS — Z23 Encounter for immunization: Secondary | ICD-10-CM | POA: Diagnosis not present

## 2022-07-17 DIAGNOSIS — E559 Vitamin D deficiency, unspecified: Secondary | ICD-10-CM

## 2022-07-17 DIAGNOSIS — M810 Age-related osteoporosis without current pathological fracture: Secondary | ICD-10-CM | POA: Diagnosis not present

## 2022-07-17 DIAGNOSIS — D72819 Decreased white blood cell count, unspecified: Secondary | ICD-10-CM

## 2022-07-17 DIAGNOSIS — R7303 Prediabetes: Secondary | ICD-10-CM | POA: Diagnosis not present

## 2022-07-17 DIAGNOSIS — E785 Hyperlipidemia, unspecified: Secondary | ICD-10-CM | POA: Diagnosis not present

## 2022-07-17 LAB — CBC
HCT: 39.5 % (ref 36.0–46.0)
Hemoglobin: 13.3 g/dL (ref 12.0–15.0)
MCHC: 33.7 g/dL (ref 30.0–36.0)
MCV: 97.6 fl (ref 78.0–100.0)
Platelets: 226 10*3/uL (ref 150.0–400.0)
RBC: 4.05 Mil/uL (ref 3.87–5.11)
RDW: 13.2 % (ref 11.5–15.5)
WBC: 3.3 10*3/uL — ABNORMAL LOW (ref 4.0–10.5)

## 2022-07-17 LAB — LIPID PANEL
Cholesterol: 197 mg/dL (ref 0–200)
HDL: 81.2 mg/dL (ref >39.00)
LDL Cholesterol: 102 mg/dL — ABNORMAL HIGH (ref 0–99)
NonHDL: 115.35
Total CHOL/HDL Ratio: 2
Triglycerides: 67 mg/dL (ref 0.0–149.0)
VLDL: 13.4 mg/dL (ref 0.0–40.0)

## 2022-07-17 LAB — BASIC METABOLIC PANEL
BUN: 15 mg/dL (ref 6–23)
CO2: 30 mEq/L (ref 19–32)
Calcium: 9.7 mg/dL (ref 8.4–10.5)
Chloride: 101 mEq/L (ref 96–112)
Creatinine, Ser: 0.94 mg/dL (ref 0.40–1.20)
GFR: 58.21 mL/min — ABNORMAL LOW (ref >60.00)
Glucose, Bld: 89 mg/dL (ref 70–99)
Potassium: 4.5 mEq/L (ref 3.5–5.1)
Sodium: 138 mEq/L (ref 135–145)

## 2022-07-17 LAB — VITAMIN D 25 HYDROXY (VIT D DEFICIENCY, FRACTURES): VITD: 32.04 ng/mL (ref 30.00–100.00)

## 2022-07-17 LAB — HEMOGLOBIN A1C: Hgb A1c MFr Bld: 5.8 % (ref 4.6–6.5)

## 2022-07-22 ENCOUNTER — Telehealth: Payer: Self-pay

## 2022-07-22 NOTE — Telephone Encounter (Signed)
S/w pt who voices concern for her T-score, stating her PCP is recommending bisphosphonate therapy, as is Dr Lindi Adie. Pt was apprehensive to start d/t all side effects. Explained pt's risk for fracture and need for BT to improve bone health. She states she performs WB exercises, takes calcium, vit D and has a healthy diet. Advised pt d/t her age, and taking antiestrogen therapy her osteoporosis will not improve with just diet and exercise. She is in agreement with Dr Lindi Adie ordering Prolia. Educated pt on medication and interval. She understands she will need labs prior to injections, with the exception of her first inj since she had labs 10/18; calcium WNL. Message sent to prior auth team to work on ath for inj.

## 2022-07-22 NOTE — Telephone Encounter (Signed)
Pt called and LVM requesting a call back. Left no detailed message. Attempted to call pt and LVM for call back. Instructed pt to leave a detailed message if we are unable to answer when she calls back.

## 2022-07-23 ENCOUNTER — Telehealth: Payer: Self-pay

## 2022-07-23 ENCOUNTER — Telehealth: Payer: Self-pay | Admitting: Family Medicine

## 2022-07-23 ENCOUNTER — Encounter: Payer: Self-pay | Admitting: Family Medicine

## 2022-07-23 NOTE — Telephone Encounter (Signed)
See below

## 2022-07-23 NOTE — Telephone Encounter (Signed)
Patient called to see if she can speak to Dr. Lorelei Pont directly, if possible, as opposed to mychart. Patient said she has been approved for a prolia injection at the cancer center. She said it happened very quickly. She wants to discuss this with her and a couple of other questions. [phone]

## 2022-07-23 NOTE — Telephone Encounter (Signed)
S/w pt to make her aware her prolia had vbeen authorized and she would be receiving a call from scheduler to get her set up q6 mo injections and labs. Pt had several questions pertaining to her oral health and side efftects from prolia. Educated pt on side effects and pt states she wishes to see MD to discuss further. She agreed to come in 07/30/22 at 2:15 then injection at 2:45. She knows to call for any further questions or concerns.

## 2022-07-23 NOTE — Telephone Encounter (Signed)
Medication prescribed by her oncologist.  Mychart message to pt - I defer to Dr Lindi Adie who prescribed this medication

## 2022-07-25 ENCOUNTER — Telehealth: Payer: Self-pay | Admitting: Hematology and Oncology

## 2022-07-25 NOTE — Telephone Encounter (Signed)
Scheduled appointment per secure chat from [place]. Patient is aware of the upcoming appointment.

## 2022-07-26 NOTE — Progress Notes (Signed)
Patient Care Team: [name], Gay Filler, MD as PCP - General (Family Medicine) Magrinat, Virgie Dad, MD (Inactive) as Consulting Physician (Oncology) Stark Klein, MD as Consulting Physician (General Surgery) Everitt Amber, MD as Consulting Physician (Gynecologic Oncology) Gery Pray, MD as Consulting Physician (Radiation Oncology) Cheri Fowler, MD as Consulting Physician (Obstetrics and Gynecology) Rockwell Germany, RN as Nurse Navigator Tressie Ellis, Paulette Blanch, RN as Nurse Navigator  DIAGNOSIS: No diagnosis found.  SUMMARY OF ONCOLOGIC HISTORY: Oncology History  Endometrial cancer ([place])  09/26/2015 Initial Diagnosis   Endometrial cancer ([place])   09/21/2020 Cancer Staging   Staging form: Corpus Uteri - Carcinoma, AJCC 7th Edition - Clinical: FIGO Stage IB (T1b, N0, M0) - Signed by Chauncey Cruel, MD on 09/21/2020   Malignant neoplasm of upper-outer quadrant of left breast in female, estrogen receptor positive ([place])  09/21/2020 Initial Diagnosis   Malignant neoplasm of upper-outer quadrant of left breast in female, estrogen receptor positive ([place])   09/21/2020 Cancer Staging   Staging form: Breast, AJCC 8th Edition - Clinical: Stage IA (cT1c, cN0, cM0, G3, ER+, PR+, HER2: Equivocal) - Signed by Chauncey Cruel, MD on 09/21/2020    Genetic Testing   Negative genetic testing. No pathogenic variants identified on the [facility] Multi-Cancer Panel +RNA. The report date is 12/17/2020.  The Multi-Cancer Panel + RNA offered by [facility] includes sequencing and/or deletion duplication testing of the following 84 genes: AIP, ALK, APC, ATM, AXIN2,BAP1,  BARD1, BLM, BMPR1A, BRCA1, BRCA2, BRIP1, CASR, CDC73, CDH1, CDK4, CDKN1B, CDKN1C, CDKN2A (p14ARF), CDKN2A (p16INK4a), CEBPA, CHEK2, CTNNA1, DICER1, DIS3L2, EGFR (c.2369C>T, p.Thr790Met variant only), EPCAM (Deletion/duplication testing only), FH, FLCN, GATA2, GPC3, GREM1 (Promoter region deletion/duplication testing only), HOXB13 (c.251G>A,  p.Gly84Glu), HRAS, KIT, MAX, MEN1, MET, MITF (c.952G>A, p.Glu318Lys variant only), MLH1, MSH2, MSH3, MSH6, MUTYH, NBN, NF1, NF2, NTHL1, PALB2, PDGFRA, PHOX2B, PMS2, POLD1, POLE, POT1, PRKAR1A, PTCH1, PTEN, RAD50, RAD51C, RAD51D, RB1, RECQL4, RET, RUNX1, SDHAF2, SDHA (sequence changes only), SDHB, SDHC, SDHD, SMAD4, SMARCA4, SMARCB1, SMARCE1, STK11, SUFU, TERC, TERT, TMEM127, TP53, TSC1, TSC2, VHL, WRN and WT1.     CHIEF COMPLIANT: follow-up to discuss Prolia  INTERVAL HISTORY: OSCAR FORMAN is a 76 y.o with left breast cancer. Currently on anastrozole. She presents to the clinic for a follow-up.    ALLERGIES:  is allergic to morphine and related.  MEDICATIONS:  Current Outpatient Medications  Medication Sig Dispense Refill   ALPRAZolam (XANAX) 0.25 MG tablet Take 1 tablet (0.25 mg total) by mouth 2 (two) times daily as needed for anxiety. 20 tablet 0   anastrozole (ARIMIDEX) 1 MG tablet Take 1 tablet (1 mg total) by mouth daily. 90 tablet 4   cholecalciferol (VITAMIN D3) 25 MCG (1000 UNIT) tablet Take 2 tablets (2,000 Units total) by mouth daily.     Cyanocobalamin (VITAMIN B 12) 500 MCG TABS Take by mouth.     HYDROcodone-acetaminophen (NORCO/VICODIN) 5-325 MG tablet TAKE 1 OR 2 TABLETS EVERY 6 HOURS AS NEEDED FOR PAIN. 90 tablet 0   losartan (COZAAR) 50 MG tablet Take 1 tablet (50 mg total) by mouth daily. 90 tablet 1   traZODone (DESYREL) 50 MG tablet Take 0.5 tablets (25 mg total) by mouth at bedtime as needed for sleep. 135 tablet 1   No current facility-administered medications for this visit.    PHYSICAL EXAMINATION: ECOG PERFORMANCE STATUS: {CHL ONC ECOG [phone]}  There were no vitals filed for this visit. There were no vitals filed for this visit.  BREAST:*** No palpable masses or nodules  in either right or left breasts. No palpable axillary supraclavicular or infraclavicular adenopathy no breast tenderness or nipple discharge. (exam performed in the presence of a  chaperone)  LABORATORY DATA:  I have reviewed the data as listed    Latest Ref Rng & Units 07/17/2022   10:35 AM 01/14/2022   10:00 AM 05/08/2021    1:07 PM  CMP  Glucose 70 - 99 mg/dL 89  90  89   BUN 6 - 23 mg/dL _0 Creatinine 0.40 - 1.20 mg/dL 0.94  0.97  0.97   Sodium 135 - 145 mEq/L 138  140  141   Potassium 3.5 - 5.1 mEq/L 4.5  5.1  3.8   Chloride 96 - 112 mEq/L 101  102  103   CO2 19 - 32 mEq/L 30  32  27   Calcium 8.4 - 10.5 mg/dL 9.7  9.7  10.1   Total Protein 6.0 - 8.3 g/dL  6.8  7.4   Total Bilirubin 0.2 - 1.2 mg/dL  0.6  0.6   Alkaline Phos 39 - 117 U/L  54  64   AST 0 - 37 U/L  19  19   ALT 0 - 35 U/L  14  13     Lab Results  Component Value Date   WBC 3.3 (L) 07/17/2022   HGB 13.3 07/17/2022   HCT 39.5 07/17/2022   MCV 97.6 07/17/2022   PLT 226.0 07/17/2022   NEUTROABS 3.5 05/08/2021    ASSESSMENT & PLAN:  No problem-specific Assessment & Plan notes found for this encounter.    No orders of the defined types were placed in this encounter.  The patient has a good understanding of the overall plan. she agrees with it. she will call with any problems that may develop before the next visit here. Total time spent: 30 mins including face to face time and time spent for planning, charting and co-ordination of care   Suzzette Righter, [place] 07/26/22    I Gardiner Coins am scribing for Dr. Lindi Adie  ***

## 2022-07-29 DIAGNOSIS — R300 Dysuria: Secondary | ICD-10-CM | POA: Diagnosis not present

## 2022-07-30 ENCOUNTER — Other Ambulatory Visit: Payer: Self-pay

## 2022-07-30 ENCOUNTER — Other Ambulatory Visit: Payer: Self-pay | Admitting: Hematology and Oncology

## 2022-07-30 ENCOUNTER — Inpatient Hospital Stay: Payer: Medicare Other | Attending: Hematology and Oncology | Admitting: Hematology and Oncology

## 2022-07-30 ENCOUNTER — Inpatient Hospital Stay: Payer: Medicare Other

## 2022-07-30 VITALS — BP 150/69 | HR 78 | Temp 97.7°F | Resp 18 | Ht 63.0 in | Wt 121.6 lb

## 2022-07-30 DIAGNOSIS — C50412 Malignant neoplasm of upper-outer quadrant of left female breast: Secondary | ICD-10-CM | POA: Diagnosis not present

## 2022-07-30 DIAGNOSIS — Z780 Asymptomatic menopausal state: Secondary | ICD-10-CM | POA: Diagnosis not present

## 2022-07-30 DIAGNOSIS — M810 Age-related osteoporosis without current pathological fracture: Secondary | ICD-10-CM | POA: Diagnosis not present

## 2022-07-30 DIAGNOSIS — Z79899 Other long term (current) drug therapy: Secondary | ICD-10-CM | POA: Diagnosis not present

## 2022-07-30 DIAGNOSIS — Z170 Estrogen receptor positive status [ER+]: Secondary | ICD-10-CM | POA: Diagnosis not present

## 2022-07-30 DIAGNOSIS — Z79811 Long term (current) use of aromatase inhibitors: Secondary | ICD-10-CM | POA: Diagnosis not present

## 2022-07-30 MED ORDER — DENOSUMAB 60 MG/ML ~~LOC~~ SOSY
60.0000 mg | PREFILLED_SYRINGE | Freq: Once | SUBCUTANEOUS | Status: AC
  Administered 2022-07-30: 60 mg via SUBCUTANEOUS
  Filled 2022-07-30: qty 1

## 2022-07-30 NOTE — Patient Instructions (Signed)
Denosumab Injection (Osteoporosis) What is this medication? DENOSUMAB (den oh SUE mab) prevents and treats osteoporosis. It works by making your bones stronger and less likely to break (fracture). It is a monoclonal antibody. This medicine may be used for other purposes; ask your health care provider or pharmacist if you have questions. COMMON BRAND NAME(S): Prolia What should I tell my care team before I take this medication? They need to know if you have any of these conditions: Dental or gum disease, or plan to have dental surgery or a tooth pulled Infection Kidney disease Low levels of calcium or vitamin D in your blood On dialysis Poor nutrition Skin conditions Thyroid disease, or have had thyroid or parathyroid surgery Trouble absorbing minerals in your stomach or intestine An unusual or allergic reaction to denosumab, other medications, foods, dyes, or preservatives Pregnant or trying to get pregnant Breastfeeding How should I use this medication? This medication is injected under the skin. It is given by your care team in a hospital or clinic setting. A special MedGuide will be given to you before each treatment. Be sure to read this information carefully each time. Talk to your care team about the use of this medication in children. Special care may be needed. Overdosage: If you think you have taken too much of this medicine contact a poison control center or emergency room at once. NOTE: This medicine is only for you. Do not share this medicine with others. What if I miss a dose? Keep appointments for follow-up doses. It is important not to miss your dose. Call your care team if you are unable to keep an appointment. What may interact with this medication? Do not take this medication with any of the following: Other medications that contain denosumab This medication may also interact with the following: Medications that lower your chance of fighting infection Steroid  medications, such as prednisone or cortisone This list may not describe all possible interactions. Give your health care provider a list of all the medicines, herbs, non-prescription drugs, or dietary supplements you use. Also tell them if you smoke, drink alcohol, or use illegal drugs. Some items may interact with your medicine. What should I watch for while using this medication? Your condition will be monitored carefully while you are receiving this medication. You may need blood work while taking this medication. This medication may increase your risk of getting an infection. Call your care team for advice if you get a fever, chills, sore throat, or other symptoms of a cold or flu. Do not treat yourself. Try to avoid being around people who are sick. Tell your dentist and dental surgeon that you are taking this medication. You should not have major dental surgery while on this medication. See your dentist to have a dental exam and fix any dental problems before starting this medication. Take good care of your teeth while on this medication. Make sure you see your dentist for regular follow-up appointments. You should make sure you get enough calcium and vitamin D while you are taking this medication. Discuss the foods you eat and the vitamins you take with your care team. Talk to your care team if you are pregnant or think you might be pregnant. This medication can cause serious birth defects if taken during pregnancy and for 5 months after the last dose. You will need a negative pregnancy test before starting this medication. Contraception is recommended while taking this medication and for 5 months after the last dose. Your care   team can help you find the option that works for you. Talk to your care team before breastfeeding. Changes to your treatment plan may be needed. What side effects may I notice from receiving this medication? Side effects that you should report to your care team as soon as  possible: Allergic reactions--skin rash, itching, hives, swelling of the face, lips, tongue, or throat Infection--fever, chills, cough, sore throat, wounds that don't heal, pain or trouble when passing urine, general feeling of discomfort or being unwell Low calcium level--muscle pain or cramps, confusion, tingling, or numbness in the hands or feet Osteonecrosis of the jaw--pain, swelling, or redness in the mouth, numbness of the jaw, poor healing after dental work, unusual discharge from the mouth, visible bones in the mouth Severe bone, joint, or muscle pain Skin infection--skin redness, swelling, warmth, or pain Side effects that usually do not require medical attention (report these to your care team if they continue or are bothersome): Back pain Headache Joint pain Muscle pain Pain in the hands, arms, legs, or feet Runny or stuffy nose Sore throat This list may not describe all possible side effects. Call your doctor for medical advice about side effects. You may report side effects to FDA at 1-800-FDA-1088. Where should I keep my medication? This medication is given in a hospital or clinic. It will not be stored at home. NOTE: This sheet is a summary. It may not cover all possible information. If you have questions about this medicine, talk to your doctor, pharmacist, or health care provider.  2023 Elsevier/Gold Standard (2022-01-28 00:00:00)  

## 2022-07-30 NOTE — Assessment & Plan Note (Addendum)
09/19/2020: Right breast biopsy: T1CN0 stage Ia grade 3 IDC ER/PR positive, HER2 equal vocal, FISH negative, Ki-67 10% (genetics negative) 10/19/2020: Left lumpectomy: Grade 2 IDC negative margins (Oncotype DX: Score 15: 4% ROR) 12/20/2020-01/10/2021: Adjuvant radiation  Current treatment: Anastrozole started neoadjuvantly 09/21/2020 Anastrozole toxicities: 1. Severely dry skin 2. Joint stiffness and achiness  Osteoporosis: T score -3.6: She is starting on Prolia today.  We will get another bone density evaluation.  I will call her with the result of this test.  Breast cancer surveillance: 1.  Breast exam 07/30/2022: Benign 2. mammogram scheduled for 09/11/2022.  Return to clinic in 6 months for another Prolia labs and follow-up.

## 2022-08-02 ENCOUNTER — Telehealth: Payer: Self-pay | Admitting: Hematology and Oncology

## 2022-08-02 NOTE — Telephone Encounter (Signed)
Scheduled appointment per 10/31 los. Patient is aware.

## 2022-08-11 NOTE — Progress Notes (Signed)
HEMATOLOGY-ONCOLOGY TELEPHONE VISIT PROGRESS NOTE  I connected with our patient on 08/16/22 at 12:00 PM EST by telephone and verified that I am speaking with the correct person using two identifiers.  I discussed the limitations, risks, security and privacy concerns of performing an evaluation and management service by telephone and the availability of in person appointments.  I also discussed with the patient that there may be a patient responsible charge related to this service. The patient expressed understanding and agreed to proceed.   History of Present Illness: Telephone F/U to discuss prolia and Bone density result.  Oncology History  Endometrial cancer ([place])  09/26/2015 Initial Diagnosis   Endometrial cancer ([place])   09/21/2020 Cancer Staging   Staging form: Corpus Uteri - Carcinoma, AJCC 7th Edition - Clinical: FIGO Stage IB (T1b, N0, M0) - Signed by Chauncey Cruel, MD on 09/21/2020   Malignant neoplasm of upper-outer quadrant of left breast in female, estrogen receptor positive ([place])  09/21/2020 Initial Diagnosis   Malignant neoplasm of upper-outer quadrant of left breast in female, estrogen receptor positive ([place])   09/21/2020 Cancer Staging   Staging form: Breast, AJCC 8th Edition - Clinical: Stage IA (cT1c, cN0, cM0, G3, ER+, PR+, HER2: Equivocal) - Signed by Chauncey Cruel, MD on 09/21/2020    Genetic Testing   Negative genetic testing. No pathogenic variants identified on the [facility] Multi-Cancer Panel +RNA. The report date is 12/17/2020.  The Multi-Cancer Panel + RNA offered by [facility] includes sequencing and/or deletion duplication testing of the following 84 genes: AIP, ALK, APC, ATM, AXIN2,BAP1,  BARD1, BLM, BMPR1A, BRCA1, BRCA2, BRIP1, CASR, CDC73, CDH1, CDK4, CDKN1B, CDKN1C, CDKN2A (p14ARF), CDKN2A (p16INK4a), CEBPA, CHEK2, CTNNA1, DICER1, DIS3L2, EGFR (c.2369C>T, p.Thr790Met variant only), EPCAM (Deletion/duplication testing only), FH, FLCN, GATA2, GPC3, GREM1  (Promoter region deletion/duplication testing only), HOXB13 (c.251G>A, p.Gly84Glu), HRAS, KIT, MAX, MEN1, MET, MITF (c.952G>A, p.Glu318Lys variant only), MLH1, MSH2, MSH3, MSH6, MUTYH, NBN, NF1, NF2, NTHL1, PALB2, PDGFRA, PHOX2B, PMS2, POLD1, POLE, POT1, PRKAR1A, PTCH1, PTEN, RAD50, RAD51C, RAD51D, RB1, RECQL4, RET, RUNX1, SDHAF2, SDHA (sequence changes only), SDHB, SDHC, SDHD, SMAD4, SMARCA4, SMARCB1, SMARCE1, STK11, SUFU, TERC, TERT, TMEM127, TP53, TSC1, TSC2, VHL, WRN and WT1.     REVIEW OF SYSTEMS:   Constitutional: Denies fevers, chills or abnormal weight loss All other systems were reviewed with the patient and are negative. Observations/Objective:     Assessment Plan:  Malignant neoplasm of upper-outer quadrant of left breast in female, estrogen receptor positive ([place]) 09/19/2020: Right breast biopsy: T1CN0 stage Ia grade 3 IDC ER/PR positive, HER2 equal vocal, FISH negative, Ki-67 10% (genetics negative) 10/19/2020: Left lumpectomy: Grade 2 IDC negative margins (Oncotype DX: Score 15: 4% ROR) 12/20/2020-01/10/2021: Adjuvant radiation   Current treatment: Anastrozole started neoadjuvantly 09/21/2020 Anastrozole toxicities: Severely dry skin Joint stiffness and achiness    Breast cancer surveillance: 1.  Breast exam 07/30/2022: Benign 2. mammogram scheduled for 09/11/2022. 3.  Bone density: 08/13/2022: T score -3.9 (was -3.6): Profound osteoporosis On Prolia 07/30/22 Muscle spasm at night.   Return to clinic every 6 months for Prolia injections with labs    I discussed the assessment and treatment plan with the patient. The patient was provided an opportunity to ask questions and all were answered. The patient agreed with the plan and demonstrated an understanding of the instructions. The patient was advised to call back or seek an in-person evaluation if the symptoms worsen or if the condition fails to improve as anticipated.   I provided 12 minutes of non-face-to-face  time  during this encounter.  This includes time for charting and coordination of care   Harriette Ohara, MD  I Gardiner Coins am scribing for Dr. Lindi Adie  I have reviewed the above documentation for accuracy and completeness, and I agree with the above.

## 2022-08-12 ENCOUNTER — Telehealth: Payer: Self-pay

## 2022-08-12 ENCOUNTER — Encounter (HOSPITAL_BASED_OUTPATIENT_CLINIC_OR_DEPARTMENT_OTHER): Payer: Self-pay

## 2022-08-12 ENCOUNTER — Ambulatory Visit (HOSPITAL_BASED_OUTPATIENT_CLINIC_OR_DEPARTMENT_OTHER)
Admission: RE | Admit: 2022-08-12 | Discharge: 2022-08-12 | Disposition: A | Discharge status: Home / Self Care | Payer: Medicare Other | Source: Ambulatory Visit | Attending: Hematology and Oncology | Admitting: Hematology and Oncology

## 2022-08-12 DIAGNOSIS — Z170 Estrogen receptor positive status [ER+]: Secondary | ICD-10-CM

## 2022-08-12 DIAGNOSIS — Z780 Asymptomatic menopausal state: Secondary | ICD-10-CM

## 2022-08-12 NOTE — Telephone Encounter (Signed)
Pt called and verbalized concerns in regards to DEXA ordered by MD. Pt states she is not sure if Medicare will pay for DEXA d/t her hving one 04/2021. Per medicare guidelines she will be covered d/t initiating bisphosphonate therapy. She will proceed with bone density test.

## 2022-08-13 ENCOUNTER — Ambulatory Visit (HOSPITAL_BASED_OUTPATIENT_CLINIC_OR_DEPARTMENT_OTHER)
Admission: RE | Admit: 2022-08-13 | Discharge: 2022-08-13 | Disposition: A | Discharge status: Home / Self Care | Payer: Medicare Other | Source: Ambulatory Visit | Attending: Hematology and Oncology | Admitting: Hematology and Oncology

## 2022-08-13 DIAGNOSIS — Z780 Asymptomatic menopausal state: Secondary | ICD-10-CM | POA: Insufficient documentation

## 2022-08-13 DIAGNOSIS — C50412 Malignant neoplasm of upper-outer quadrant of left female breast: Secondary | ICD-10-CM | POA: Insufficient documentation

## 2022-08-13 DIAGNOSIS — Z13820 Encounter for screening for osteoporosis: Secondary | ICD-10-CM | POA: Insufficient documentation

## 2022-08-13 DIAGNOSIS — M810 Age-related osteoporosis without current pathological fracture: Secondary | ICD-10-CM | POA: Insufficient documentation

## 2022-08-13 DIAGNOSIS — Z170 Estrogen receptor positive status [ER+]: Secondary | ICD-10-CM | POA: Diagnosis not present

## 2022-08-16 ENCOUNTER — Inpatient Hospital Stay: Payer: Medicare Other | Attending: Hematology and Oncology | Admitting: Hematology and Oncology

## 2022-08-16 DIAGNOSIS — C50412 Malignant neoplasm of upper-outer quadrant of left female breast: Secondary | ICD-10-CM | POA: Diagnosis not present

## 2022-08-16 DIAGNOSIS — Z170 Estrogen receptor positive status [ER+]: Secondary | ICD-10-CM

## 2022-08-16 NOTE — Assessment & Plan Note (Signed)
09/19/2020: Right breast biopsy: T1CN0 stage Ia grade 3 IDC ER/PR positive, HER2 equal vocal, FISH negative, Ki-67 10% (genetics negative) 10/19/2020: Left lumpectomy: Grade 2 IDC negative margins (Oncotype DX: Score 15: 4% ROR) 12/20/2020-01/10/2021: Adjuvant radiation   Current treatment: Anastrozole started neoadjuvantly 09/21/2020 Anastrozole toxicities: Severely dry skin Joint stiffness and achiness    Breast cancer surveillance: 1.  Breast exam 07/30/2022: Benign 2. mammogram scheduled for 09/11/2022. 3.  Bone density: 08/13/2022: T score -3.9 (was -3.6): Profound osteoporosis   Return to clinic every 6 months for Prolia injections with labs

## 2022-09-02 ENCOUNTER — Encounter: Payer: Self-pay | Admitting: Family Medicine

## 2022-09-05 ENCOUNTER — Other Ambulatory Visit (HOSPITAL_BASED_OUTPATIENT_CLINIC_OR_DEPARTMENT_OTHER): Payer: Self-pay

## 2022-09-05 ENCOUNTER — Encounter: Payer: Self-pay | Admitting: Oncology

## 2022-09-05 DIAGNOSIS — Z23 Encounter for immunization: Secondary | ICD-10-CM | POA: Diagnosis not present

## 2022-09-05 MED ORDER — COMIRNATY 30 MCG/0.3ML IM SUSY
PREFILLED_SYRINGE | INTRAMUSCULAR | 0 refills | Status: DC
  Filled 2022-09-05: qty 0.3, 1d supply, fill #0

## 2022-09-10 ENCOUNTER — Telehealth: Payer: Self-pay | Admitting: Family Medicine

## 2022-09-10 DIAGNOSIS — M25569 Pain in unspecified knee: Secondary | ICD-10-CM

## 2022-09-10 NOTE — Telephone Encounter (Signed)
Called her back-she injured her knee while walking this morning.  She is having a hard time bearing weight.  She was just walking along when she had a sudden pain in her knee, she did not fall or otherwise get injured.  The knee feels fine as long as she is not standing on it. She does not prefer to go to the ER She is a patient at [facility] I placed a referral to [facility] and asked them to please call them first thing in the morning, request a same-day appointment.  If she cannot get a same-day appointment please let me know

## 2022-09-10 NOTE — Telephone Encounter (Signed)
Patient called to see if she could speak with  provider or CMA. She said she injured her knee this morning and she cannot walk and she is alone. Patient wants to get recommendations on what she should do. Please call patient back.

## 2022-09-10 NOTE — Telephone Encounter (Signed)
Should she call EMS since she can not walk?

## 2022-09-11 DIAGNOSIS — M1711 Unilateral primary osteoarthritis, right knee: Secondary | ICD-10-CM | POA: Diagnosis not present

## 2022-09-11 DIAGNOSIS — M25561 Pain in right knee: Secondary | ICD-10-CM | POA: Diagnosis not present

## 2022-09-11 DIAGNOSIS — S83241A Other tear of medial meniscus, current injury, right knee, initial encounter: Secondary | ICD-10-CM | POA: Diagnosis not present

## 2022-09-12 ENCOUNTER — Telehealth: Payer: Self-pay | Admitting: Family Medicine

## 2022-09-12 ENCOUNTER — Encounter: Payer: Self-pay | Admitting: Family Medicine

## 2022-09-12 NOTE — Telephone Encounter (Signed)
Please see below.

## 2022-09-12 NOTE — Telephone Encounter (Signed)
Patient called to say she went to [facility] yesterday and they diagnosed her with a torn meniscus but gave her no after care instructions. Patient wants to know what she should do as she cannot walk without crutches and she lives alone. Her daughter is helping her a little bit. Patient said they recommended physical therapy and she wants to know if Dr. Lorelei Pont thinks [facility] is a good place to have therapy or does she recommend somewhere else. They were not pleased with the fact they didn't get after care instructions. She is okay with message being sent by mychart, but "she likes when Dr. Lorelei Pont calls her"

## 2022-09-13 ENCOUNTER — Telehealth: Payer: Self-pay | Admitting: *Deleted

## 2022-09-13 NOTE — Telephone Encounter (Signed)
Received call from pt stating she recently tore her meniscus and had to reschedule her mammogram. MD notified and verbalized understanding.

## 2022-09-16 DIAGNOSIS — M25561 Pain in right knee: Secondary | ICD-10-CM | POA: Diagnosis not present

## 2022-09-19 DIAGNOSIS — M25561 Pain in right knee: Secondary | ICD-10-CM | POA: Diagnosis not present

## 2022-09-24 DIAGNOSIS — M25561 Pain in right knee: Secondary | ICD-10-CM | POA: Diagnosis not present

## 2022-09-27 DIAGNOSIS — M25561 Pain in right knee: Secondary | ICD-10-CM | POA: Diagnosis not present

## 2022-10-01 DIAGNOSIS — M25561 Pain in right knee: Secondary | ICD-10-CM | POA: Diagnosis not present

## 2022-10-01 DIAGNOSIS — R300 Dysuria: Secondary | ICD-10-CM | POA: Diagnosis not present

## 2022-10-04 DIAGNOSIS — M25561 Pain in right knee: Secondary | ICD-10-CM | POA: Diagnosis not present

## 2022-10-11 DIAGNOSIS — M25561 Pain in right knee: Secondary | ICD-10-CM | POA: Diagnosis not present

## 2022-10-15 DIAGNOSIS — M25561 Pain in right knee: Secondary | ICD-10-CM | POA: Diagnosis not present

## 2022-10-18 ENCOUNTER — Encounter (INDEPENDENT_AMBULATORY_CARE_PROVIDER_SITE_OTHER): Payer: Medicare Other | Admitting: Family Medicine

## 2022-10-18 ENCOUNTER — Other Ambulatory Visit: Payer: Self-pay | Admitting: Family Medicine

## 2022-10-18 DIAGNOSIS — N390 Urinary tract infection, site not specified: Secondary | ICD-10-CM | POA: Diagnosis not present

## 2022-10-18 DIAGNOSIS — I10 Essential (primary) hypertension: Secondary | ICD-10-CM

## 2022-10-18 DIAGNOSIS — R300 Dysuria: Secondary | ICD-10-CM | POA: Diagnosis not present

## 2022-10-18 NOTE — Telephone Encounter (Signed)

## 2022-10-22 DIAGNOSIS — M25561 Pain in right knee: Secondary | ICD-10-CM | POA: Diagnosis not present

## 2022-10-23 DIAGNOSIS — M1711 Unilateral primary osteoarthritis, right knee: Secondary | ICD-10-CM | POA: Diagnosis not present

## 2022-10-23 DIAGNOSIS — S83241A Other tear of medial meniscus, current injury, right knee, initial encounter: Secondary | ICD-10-CM | POA: Diagnosis not present

## 2022-10-30 DIAGNOSIS — R300 Dysuria: Secondary | ICD-10-CM | POA: Diagnosis not present

## 2022-10-30 DIAGNOSIS — N3000 Acute cystitis without hematuria: Secondary | ICD-10-CM | POA: Diagnosis not present

## 2022-10-31 DIAGNOSIS — M25561 Pain in right knee: Secondary | ICD-10-CM | POA: Diagnosis not present

## 2022-11-04 DIAGNOSIS — M25561 Pain in right knee: Secondary | ICD-10-CM | POA: Diagnosis not present

## 2022-11-06 ENCOUNTER — Other Ambulatory Visit: Payer: Self-pay | Admitting: Family Medicine

## 2022-11-06 ENCOUNTER — Ambulatory Visit
Admission: RE | Admit: 2022-11-06 | Discharge: 2022-11-06 | Disposition: A | Discharge status: Home / Self Care | Payer: Medicare Other | Source: Ambulatory Visit | Attending: Family Medicine | Admitting: Family Medicine

## 2022-11-06 DIAGNOSIS — R928 Other abnormal and inconclusive findings on diagnostic imaging of breast: Secondary | ICD-10-CM

## 2022-11-06 DIAGNOSIS — Z853 Personal history of malignant neoplasm of breast: Secondary | ICD-10-CM | POA: Diagnosis not present

## 2022-11-12 ENCOUNTER — Encounter: Payer: Self-pay | Admitting: Family Medicine

## 2022-11-12 DIAGNOSIS — M25561 Pain in right knee: Secondary | ICD-10-CM | POA: Diagnosis not present

## 2022-11-13 ENCOUNTER — Ambulatory Visit (INDEPENDENT_AMBULATORY_CARE_PROVIDER_SITE_OTHER): Payer: Medicare Other | Admitting: Family Medicine

## 2022-11-13 ENCOUNTER — Encounter: Payer: Self-pay | Admitting: Family Medicine

## 2022-11-13 VITALS — BP 170/68 | HR 68 | Temp 98.0°F | Ht 63.5 in | Wt 124.2 lb

## 2022-11-13 DIAGNOSIS — I10 Essential (primary) hypertension: Secondary | ICD-10-CM

## 2022-11-13 MED ORDER — LOSARTAN POTASSIUM 100 MG PO TABS: 100.0000 mg | ORAL_TABLET | Freq: Every day | ORAL | 1 refills | Status: DC

## 2022-11-13 MED ORDER — AMLODIPINE BESYLATE 5 MG PO TABS: 5.0000 mg | ORAL_TABLET | Freq: Every day | ORAL | 3 refills | Status: DC

## 2022-11-13 MED ORDER — DENOSUMAB 60 MG/ML ~~LOC~~ SOSY: 60.0000 mg | PREFILLED_SYRINGE | SUBCUTANEOUS | 0 refills | Status: AC

## 2022-11-13 NOTE — Progress Notes (Signed)
Chief Complaint  Patient presents with   Hypertension    Subjective Dawn Powers is a 77 y.o. female who presents for hypertension follow up. She does monitor home blood pressures. Blood pressures ranging from 140-170's/70's on average. She is compliant with medication- losartan 50 mg/d. Patient has these side effects of medication: none She is adhering to a healthy diet overall. Current exercise: walking, lifting wts No Cp or SOB.    Past Medical History:  Diagnosis Date   Anxiety    Arthritis    Breast cancer ([place]) 09/19/2020   left breast [facility] w/ radiation no chemo   Cancer ([place])    Endometrial    Family history of breast cancer    Family history of kidney cancer    Family history of prostate cancer    Heart murmur    Hyperlipidemia    Hypertension    Neuromuscular disorder ([place])    DDD   Osteoporosis 06/12/2021   Pain    lower back -hx "DDD"   Personal history of radiation therapy    left [facility] 08/2020 w/ radiation no chemo   Radiation 11/15/15, 11/23/15, 11/30/15, 12/07/15, 12/14/15   HDR brachytherapy    Exam BP (!) 170/68 (BP Location: Right Arm, Cuff Size: Normal)   Pulse 68   Temp 98 F (36.7 C) (Oral)   Ht 5' 3.5" (1.613 m)   Wt 124 lb 4 oz (56.4 kg)   SpO2 98%   BMI 21.66 kg/m  General:  well developed, well nourished, in no apparent distress Heart: RRR, no bruits, no LE edema Lungs: clear to auscultation, no accessory muscle use Psych: well oriented with normal range of affect and appropriate judgment/insight  Essential hypertension - Plan: losartan (COZAAR) 100 MG tablet, amLODipine (NORVASC) 5 MG tablet, Basic metabolic panel  Chronic, uncontrolled. Increase losartan to 100 mg/d (she started this yesterday) and start Norvasc 5 mg/d. Monitor BP at home. Monitor BP at home. BMP in 1 week. Counseled on diet and exercise. F/u in 2 weeks w Dr. Lorelei Pont. The patient voiced understanding and agreement to the plan.  [place], DO 11/13/22  11:36  AM

## 2022-11-13 NOTE — Telephone Encounter (Signed)
Pt seeing Dr. Nani Ravens for blood pressure. She was advised to see someone in 24 hours once office open. Pt PCP unavailable

## 2022-11-13 NOTE — Telephone Encounter (Signed)
Initial Comment Caller states her blood pressure is high and would like to talk to the doctor. Caller states last b/p reading was 182/80 pulse was 69. Translation No Nurse Assessment Nurse: Patsey Berthold, RN, [place] Date/Time (Eastern Time): 11/12/2022 6:12:39 PM Confirm and document reason for call. If symptomatic, describe symptoms. ---Caller states last BP 182/80 HR 69. Reports compliant with HTN medications. Reports mild head pain Does the patient have any new or worsening symptoms? ---Yes Will a triage be completed? ---Yes Related visit to physician within the last 2 weeks? ---No Does the PT have any chronic conditions? (i.e. diabetes, asthma, this includes High risk factors for pregnancy, etc.) ---Yes List chronic conditions. ---HTN CA - remission Is this a behavioral health or substance abuse call? ---No Guidelines Guideline Title Affirmed Question Affirmed Notes Nurse Date/Time (Eastern Time) Blood Pressure - High Systolic BP >= 99991111 OR Diastolic >= [id number] Patsey Berthold, [place], [place] 11/12/2022 6:15:41 PM Disp. Time Eilene Ghazi Time) Disposition Final User 11/12/2022 6:21:19 PM See PCP within 24 Hours Yes Patsey Berthold RN, [place] Final Disposition 11/12/2022 6:21:19 PM See PCP within 24 Hours Yes Patsey Berthold, RN, [place] PLEASE NOTE: All timestamps contained within this report are represented as [place] Standard Time. CONFIDENTIALTY NOTICE: This fax transmission is intended only for the addressee. It contains information that is legally privileged, confidential or otherwise protected from use or disclosure. If you are not the intended recipient, you are strictly prohibited from reviewing, disclosing, copying using or disseminating any of this information or taking any action in reliance on or regarding this information. If you have received this fax in error, please notify us immediately by telephone so that we can arrange for its return to [place]. Phone: [phone], Toll-Free: [phone], Fax:  [phone] Page: 2 of 2 Call Id: [id number] [place] Disagree/Comply Comply Caller Understands Yes PreDisposition Did not know what to do Care Advice Given Per Guideline SEE PCP WITHIN 24 HOURS: * IF OFFICE WILL BE OPEN: You need to be examined within the next 24 hours. Call your doctor (or NP/PA) when the office opens and make an appointment. CARE ADVICE given per High Blood Pressure (Adult) guideline. CALL BACK IF: * You become worse * IF OFFICE WILL BE CLOSED: You need to be seen within the next 24 hours. A clinic or an urgent care center is often a good source of care if your doctor's office is closed or you can't get an appointment. * Weakness or numbness of the face, arm or leg on one side of the body occurs * Difficulty walking, difficulty talking, or severe headache occurs * Chest pain or difficulty breathing occurs Referrals REFERRED TO PCP OFFICE

## 2022-11-13 NOTE — Patient Instructions (Signed)
Check your blood pressures 2-3 times per week, alternating the time of day you check it. If it is high, considering waiting 1-2 minutes and rechecking. If it gets higher, your anxiety is likely creeping up and we should avoid rechecking.   Keep the diet clean and stay active.  Let us know if you need anything.

## 2022-11-18 ENCOUNTER — Telehealth: Payer: Self-pay | Admitting: Family Medicine

## 2022-11-18 NOTE — Telephone Encounter (Signed)
Copied from [place] [phone]. Topic: Medicare AWV >> Nov 18, 2022 11:43 AM Devoria Glassing wrote: Reason for CRM:  Left message for patient to call back and schedule Medicare Annual Wellness Visit (AWV).  Last date of AWV: 11/23/2021  Please schedule an appointment at any time with [facility].  If any questions, please contact me.  Thank you ,  [place] Direct Dial: [phone]

## 2022-11-19 DIAGNOSIS — M25561 Pain in right knee: Secondary | ICD-10-CM | POA: Diagnosis not present

## 2022-11-20 ENCOUNTER — Other Ambulatory Visit (INDEPENDENT_AMBULATORY_CARE_PROVIDER_SITE_OTHER): Payer: Medicare Other

## 2022-11-20 ENCOUNTER — Encounter: Payer: Self-pay | Admitting: Family Medicine

## 2022-11-20 DIAGNOSIS — I10 Essential (primary) hypertension: Secondary | ICD-10-CM

## 2022-11-20 LAB — BASIC METABOLIC PANEL
BUN: 23 mg/dL (ref 6–23)
CO2: 28 mEq/L (ref 19–32)
Calcium: 9.7 mg/dL (ref 8.4–10.5)
Chloride: 101 mEq/L (ref 96–112)
Creatinine, Ser: 1.05 mg/dL (ref 0.40–1.20)
GFR: 50.85 mL/min — ABNORMAL LOW (ref >60.00)
Glucose, Bld: 98 mg/dL (ref 70–99)
Potassium: 4.8 mEq/L (ref 3.5–5.1)
Sodium: 139 mEq/L (ref 135–145)

## 2022-11-23 NOTE — Progress Notes (Unsigned)
[place] [facility] [address] [place], [place] [address] [phone]  Date:  11/27/2022   Name:  Dawn Powers   DOB:  [birth year]   MRN:  [id number]  PCP:  Darreld Mclean, MD    Chief Complaint: No chief complaint on file.   History of Present Illness:  Dawn Powers is a 77 y.o. very pleasant female patient who presents with the following:  Patient seen today for follow-up- history of pre-diabetes, HTN, endometrial cancer s/p hyst 2016, chronic back pain, breast cancer dx 12/21, osteoporosis   Most recent visit with myself was in October Taking anastrozole for breast cancer-she saw her oncologist for follow-up in November  She was seen by my partner Dr. Nani Ravens on February 14 with concern of elevated blood pressure; he noted patient was taking her losartan regularly, no chest pain or shortness of breath He added 5 mg of amlodipine, patient had already increased her losartan to 100 mg from 11  Lab work from last month showed mild decrease in GFR Patient Active Problem List   Diagnosis Date Noted   Osteoporosis 06/12/2021   Genetic testing 12/18/2020   Family history of prostate cancer    Family history of breast cancer    Family history of kidney cancer    Malignant neoplasm of upper-outer quadrant of left breast in female, estrogen receptor positive ([place]) 09/21/2020   Prediabetes 07/02/2019   S/P shoulder replacement, 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   Endometrial cancer ([place]) 09/26/2015   Essential hypertension 05/23/2015   Thoracic facet joint syndrome 04/27/2014   Back pain 03/24/2014   Muscle spasm 03/24/2014   Scoliosis (and kyphoscoliosis), idiopathic 12/22/2012   Osteopenia 12/22/2012   Other and unspecified hyperlipidemia 12/22/2012    Past Medical History:  Diagnosis Date   Anxiety    Arthritis    Breast cancer ([place]) 09/19/2020   left breast [facility] w/ radiation no chemo   Cancer ([place])    Endometrial    Family  history of breast cancer    Family history of kidney cancer    Family history of prostate cancer    Heart murmur    Hyperlipidemia    Hypertension    Neuromuscular disorder ([place])    DDD   Osteoporosis 06/12/2021   Pain    lower back -hx "DDD"   Personal history of radiation therapy    left [facility] 08/2020 w/ radiation no chemo   Radiation 11/15/15, 11/23/15, 11/30/15, 12/07/15, 12/14/15   HDR brachytherapy    Past Surgical History:  Procedure Laterality Date   ABDOMINAL HYSTERECTOMY     BREAST LUMPECTOMY Left 10/19/2018   left lumpectomy 10-19-20   BREAST LUMPECTOMY WITH RADIOACTIVE SEED AND SENTINEL LYMPH NODE BIOPSY Left 10/19/2020   Procedure: LEFT BREAST LUMPECTOMY WITH RADIOACTIVE SEED AND SENTINEL LYMPH NODE BIOPSY;  Surgeon: Stark Klein, MD;  Location: [place];  Service: General;  Laterality: Left;  PEC BLOCK, RNFA   CATARACT EXTRACTION Bilateral    COLONOSCOPY     FRACTURE SURGERY Right    ankle   GANGLION CYST EXCISION     LYMPH NODE BIOPSY N/A 09/26/2015   Procedure: SENTINEL LYMPH NODE BIOPSY;  Surgeon: Everitt Amber, MD;  Location: [facility];  Service: Gynecology;  Laterality: N/A;   ROBOTIC ASSISTED TOTAL HYSTERECTOMY WITH BILATERAL SALPINGO OOPHERECTOMY Bilateral 09/26/2015   Procedure: ROBOTIC ASSISTED TOTAL HYSTERECTOMY WITH BILATERAL SALPINGO OOPHORECTOMY;  Surgeon: Everitt Amber, MD;  Location[facility];  Service: Gynecology;  Laterality: Bilateral;   TOTAL SHOULDER ARTHROPLASTY Left 09/10/2018   Procedure: TOTAL SHOULDER ARTHROPLASTY;  Surgeon: Justice Britain, MD;  Location: [place];  Service: Orthopedics;  Laterality: Left;  173mn   WISDOM TOOTH EXTRACTION     WISDOM TOOTH EXTRACTION      Social History   Tobacco Use   Smoking status: Former    Years: 4.00    Types: Cigarettes    Quit date: 09/30/1998    Years since quitting: 24.1   Smokeless tobacco: Never  Vaping Use   Vaping Use: Never used  Substance Use Topics   Alcohol use: Yes    Alcohol/week: 3.0  standard drinks of alcohol    Types: 3 Standard drinks or equivalent per week    Comment: 5 drinks per week   Drug use: No    Family History  Problem Relation Age of Onset   Heart disease Mother    Alcohol abuse Mother        breast cancer - questionable    Dementia Mother    Alcohol abuse Father    Prostate cancer Father 650      prostate and colon- unsure of primary    Kidney cancer Brother [phone]  Alcohol abuse Maternal Grandfather    Alcohol abuse Sister    Basal cell carcinoma Sister    Breast cancer Cousin        double mastectomy, d. 525  Prostate cancer Brother 767  Esophageal cancer Neg Hx    Stomach cancer Neg Hx    Rectal cancer Neg Hx     Allergies  Allergen Reactions   Morphine And Related Hives    Medication list has been reviewed and updated.  Current Outpatient Medications on File Prior to Visit  Medication Sig Dispense Refill   ALPRAZolam (XANAX) 0.25 MG tablet Take 1 tablet (0.25 mg total) by mouth 2 (two) times daily as needed for anxiety. 20 tablet 0   amLODipine (NORVASC) 5 MG tablet Take 1 tablet (5 mg total) by mouth daily. 30 tablet 3   anastrozole (ARIMIDEX) 1 MG tablet Take 1 tablet (1 mg total) by mouth daily. 90 tablet 4   cholecalciferol (VITAMIN D3) 25 MCG (1000 UNIT) tablet Take 2 tablets (2,000 Units total) by mouth daily.     COVID-19 mRNA vaccine 2023-2024 (COMIRNATY) syringe Inject into the muscle. 0.3 mL 0   Cyanocobalamin (VITAMIN B 12) 500 MCG TABS Take by mouth.     [START ON 01/12/2023] denosumab (PROLIA) 60 MG/ML SOSY injection Inject 60 mg into the skin every 6 (six) months. 1 mL 0   HYDROcodone-acetaminophen (NORCO/VICODIN) 5-325 MG tablet TAKE 1 OR 2 TABLETS EVERY 6 HOURS AS NEEDED FOR PAIN. 90 tablet 0   losartan (COZAAR) 100 MG tablet Take 1 tablet (100 mg total) by mouth daily. 90 tablet 1   magnesium 30 MG tablet Take 30 mg by mouth 2 (two) times daily.     methenamine (HIPREX) 1 g tablet Take 1 g by mouth 2 (two) times daily  with a meal.     traZODone (DESYREL) 50 MG tablet Take 0.5 tablets (25 mg total) by mouth at bedtime as needed for sleep. 135 tablet 1   No current facility-administered medications on file prior to visit.    Review of Systems:  As per HPI- otherwise negative.   Physical Examination: There were no vitals filed for this visit. There were no vitals filed for this visit. There is  no height or weight on file to calculate BMI. Ideal Body Weight:    GEN: no acute distress. HEENT: Atraumatic, Normocephalic.  Ears and Nose: No external deformity. CV: RRR, No M/G/R. No JVD. No thrill. No extra heart sounds. PULM: CTA B, no wheezes, crackles, rhonchi. No retractions. No resp. distress. No accessory muscle use. ABD: S, NT, ND, +BS. No rebound. No HSM. EXTR: No c/c/e PSYCH: Normally interactive. Conversant.    Assessment and Plan: ***  Signed Lamar Blinks, MD

## 2022-11-23 NOTE — Patient Instructions (Incomplete)
It was good to see you again today Keep an eye on your BP at home and let me know how it is looking in general over the next few weeks I will be in touch with your labs asap  Assuming all is well we can visit in 6 months or so

## 2022-11-27 ENCOUNTER — Encounter: Payer: Self-pay | Admitting: Family Medicine

## 2022-11-27 ENCOUNTER — Telehealth: Payer: Self-pay

## 2022-11-27 ENCOUNTER — Ambulatory Visit (INDEPENDENT_AMBULATORY_CARE_PROVIDER_SITE_OTHER): Payer: Medicare Other | Admitting: Family Medicine

## 2022-11-27 VITALS — BP 140/60 | HR 63 | Temp 97.8°F | Resp 18 | Ht 63.0 in | Wt 123.2 lb

## 2022-11-27 DIAGNOSIS — R790 Abnormal level of blood mineral: Secondary | ICD-10-CM | POA: Diagnosis not present

## 2022-11-27 DIAGNOSIS — I10 Essential (primary) hypertension: Secondary | ICD-10-CM

## 2022-11-27 DIAGNOSIS — D72819 Decreased white blood cell count, unspecified: Secondary | ICD-10-CM | POA: Diagnosis not present

## 2022-11-27 LAB — CBC
HCT: 38.5 % (ref 36.0–46.0)
Hemoglobin: 13 g/dL (ref 12.0–15.0)
MCHC: 33.8 g/dL (ref 30.0–36.0)
MCV: 98.8 fl (ref 78.0–100.0)
Platelets: 238 10*3/uL (ref 150.0–400.0)
RBC: 3.9 Mil/uL (ref 3.87–5.11)
RDW: 13.6 % (ref 11.5–15.5)
WBC: 5.9 10*3/uL (ref 4.0–10.5)

## 2022-11-27 LAB — COMPREHENSIVE METABOLIC PANEL
ALT: 13 U/L (ref 0–35)
AST: 19 U/L (ref 0–37)
Albumin: 4.2 g/dL (ref 3.5–5.2)
Alkaline Phosphatase: 40 U/L (ref 39–117)
BUN: 24 mg/dL — ABNORMAL HIGH (ref 6–23)
CO2: 29 mEq/L (ref 19–32)
Calcium: 9.3 mg/dL (ref 8.4–10.5)
Chloride: 102 mEq/L (ref 96–112)
Creatinine, Ser: 0.98 mg/dL (ref 0.40–1.20)
GFR: 55.23 mL/min — ABNORMAL LOW (ref >60.00)
Glucose, Bld: 92 mg/dL (ref 70–99)
Potassium: 4.2 mEq/L (ref 3.5–5.1)
Sodium: 139 mEq/L (ref 135–145)
Total Bilirubin: 0.5 mg/dL (ref 0.2–1.2)
Total Protein: 6.6 g/dL (ref 6.0–8.3)

## 2022-11-27 LAB — MAGNESIUM: Magnesium: 2 mg/dL (ref 1.5–2.5)

## 2022-11-27 NOTE — Telephone Encounter (Signed)
When leaving the exam room today, the pt noticed the term " Leukocytosis" on her AVS and she had questions about what exactly this meant because this had not been mentioned to her before.   I let her know that once she got her lab results via MyChart to ask you when exactly this came about and if you could elaborate on it a little more for her.

## 2022-11-28 ENCOUNTER — Other Ambulatory Visit: Payer: Self-pay | Admitting: Family Medicine

## 2022-11-28 DIAGNOSIS — S83241D Other tear of medial meniscus, current injury, right knee, subsequent encounter: Secondary | ICD-10-CM | POA: Diagnosis not present

## 2022-11-28 DIAGNOSIS — M1711 Unilateral primary osteoarthritis, right knee: Secondary | ICD-10-CM | POA: Diagnosis not present

## 2022-11-28 DIAGNOSIS — G894 Chronic pain syndrome: Secondary | ICD-10-CM

## 2022-11-29 DIAGNOSIS — M25561 Pain in right knee: Secondary | ICD-10-CM | POA: Diagnosis not present

## 2022-12-06 DIAGNOSIS — M25561 Pain in right knee: Secondary | ICD-10-CM | POA: Diagnosis not present

## 2022-12-11 DIAGNOSIS — N3000 Acute cystitis without hematuria: Secondary | ICD-10-CM | POA: Diagnosis not present

## 2022-12-20 DIAGNOSIS — M25561 Pain in right knee: Secondary | ICD-10-CM | POA: Diagnosis not present

## 2022-12-23 ENCOUNTER — Telehealth: Payer: Self-pay | Admitting: Family Medicine

## 2022-12-23 NOTE — Telephone Encounter (Signed)
Copied from [place] [id number]. Topic: Medicare AWV >> Dec 23, 2022 11:07 AM Devoria Glassing wrote: Reason for CRM: Called patient to schedule Medicare Annual Wellness Visit (AWV). Left message for patient to call back and schedule Medicare Annual Wellness Visit (AWV).  Last date of AWV: 11/23/21  Please schedule an appointment at any time with Beatris Ship, CMA after 01/01/23 .  If any questions, please contact me.  Thank you ,  Sherol Dade; [place] Direct Dial: [phone]

## 2022-12-27 DIAGNOSIS — L853 Xerosis cutis: Secondary | ICD-10-CM | POA: Diagnosis not present

## 2022-12-27 DIAGNOSIS — L590 Erythema ab igne [dermatitis ab igne]: Secondary | ICD-10-CM | POA: Diagnosis not present

## 2022-12-27 DIAGNOSIS — L218 Other seborrheic dermatitis: Secondary | ICD-10-CM | POA: Diagnosis not present

## 2022-12-27 DIAGNOSIS — L821 Other seborrheic keratosis: Secondary | ICD-10-CM | POA: Diagnosis not present

## 2022-12-27 DIAGNOSIS — L814 Other melanin hyperpigmentation: Secondary | ICD-10-CM | POA: Diagnosis not present

## 2022-12-27 DIAGNOSIS — I788 Other diseases of capillaries: Secondary | ICD-10-CM | POA: Diagnosis not present

## 2022-12-27 DIAGNOSIS — D225 Melanocytic nevi of trunk: Secondary | ICD-10-CM | POA: Diagnosis not present

## 2022-12-27 DIAGNOSIS — L298 Other pruritus: Secondary | ICD-10-CM | POA: Diagnosis not present

## 2023-01-03 DIAGNOSIS — M25561 Pain in right knee: Secondary | ICD-10-CM | POA: Diagnosis not present

## 2023-01-10 DIAGNOSIS — M25561 Pain in right knee: Secondary | ICD-10-CM | POA: Diagnosis not present

## 2023-01-12 NOTE — Progress Notes (Unsigned)
[facility] [address], [address] [place], [place] [address] [phone]  Date:  01/15/2023   Name:  Dawn Powers   DOB:  [birth year]   MRN:  [id number]  PCP:  Dawn Cables, MD    Chief Complaint: No chief complaint on file.   History of Present Illness:  Dawn Powers is a 77 y.o. very pleasant female patient who presents with the following:  Patient seen today for periodic recheck Most recent visit with myself was in February-history of pre-diabetes, HTN, endometrial cancer s/p hyst 2016, chronic back pain, breast cancer dx 12/21, osteoporosis  She uses hydrocodone occasionally for back and knee pain  At her visit in February, she had recently been seen by Dr. Carmelia Roller who added amlodipine due to elevated blood pressure She also had increased her losartan to 100 mg.  Pressure was reasonable at last visit; I asked her to keep an eye on it at home We also noted a minimal drop in GFR, 55 in February  BP Readings from Last 3 Encounters:  11/27/22 (!) 140/60  11/13/22 (!) 170/68  07/30/22 (!) 150/69   Alprazolam as needed Amlodipine 5 Losartan 100 mg Prolia Arimidex  Patient Active Problem List   Diagnosis Date Noted   Osteoporosis 06/12/2021   Genetic testing 12/18/2020   Family history of prostate cancer    Family history of breast cancer    Family history of kidney cancer    Malignant neoplasm of upper-outer quadrant of left breast in female, estrogen receptor positive 09/21/2020   Prediabetes 07/02/2019   S/P shoulder replacement, 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   Endometrial cancer 09/26/2015   Essential hypertension 05/23/2015   Thoracic facet joint syndrome 04/27/2014   Back pain 03/24/2014   Muscle spasm 03/24/2014   Scoliosis (and kyphoscoliosis), idiopathic 12/22/2012   Osteopenia 12/22/2012   Other and unspecified hyperlipidemia 12/22/2012    Past Medical History:  Diagnosis Date   Anxiety    Arthritis     Breast cancer ([facility]) 09/19/2020   left breast [facility] w/ radiation no chemo   Cancer ([facility])    Endometrial    Family history of breast cancer    Family history of kidney cancer    Family history of prostate cancer    Heart murmur    Hyperlipidemia    Hypertension    Neuromuscular disorder ([facility])    DDD   Osteoporosis 06/12/2021   Pain    lower back -hx "DDD"   Personal history of radiation therapy    left [facility] 08/2020 w/ radiation no chemo   Radiation 11/15/15, 11/23/15, 11/30/15, 12/07/15, 12/14/15   HDR brachytherapy    Past Surgical History:  Procedure Laterality Date   ABDOMINAL HYSTERECTOMY     BREAST LUMPECTOMY Left 10/19/2018   left lumpectomy 10-19-20   BREAST LUMPECTOMY WITH RADIOACTIVE SEED AND SENTINEL LYMPH NODE BIOPSY Left 10/19/2020   Procedure: LEFT BREAST LUMPECTOMY WITH RADIOACTIVE SEED AND SENTINEL LYMPH NODE BIOPSY;  Surgeon: Almond Lint, MD;  Location: [facility];  Service: General;  Laterality: Left;  PEC BLOCK, RNFA   CATARACT EXTRACTION Bilateral    COLONOSCOPY     FRACTURE SURGERY Right    ankle   GANGLION CYST EXCISION     LYMPH NODE BIOPSY N/A 09/26/2015   Procedure: SENTINEL LYMPH NODE BIOPSY;  Surgeon: Adolphus Birchwood, MD;  [facility];  Service: Gynecology;  Laterality: N/A;   ROBOTIC ASSISTED TOTAL HYSTERECTOMY WITH  BILATERAL SALPINGO OOPHERECTOMY Bilateral 09/26/2015   Procedure: ROBOTIC ASSISTED TOTAL HYSTERECTOMY WITH BILATERAL SALPINGO OOPHORECTOMY;  Surgeon: Adolphus Birchwood, MD;  [facility];  Service: Gynecology;  Laterality: Bilateral;   TOTAL SHOULDER ARTHROPLASTY Left 09/10/2018   Procedure: TOTAL SHOULDER ARTHROPLASTY;  Surgeon: Francena Hanly, MD;  [facility];  Service: Orthopedics;  Laterality: Left;    WISDOM TOOTH EXTRACTION     WISDOM TOOTH EXTRACTION      Social History   Tobacco Use   Smoking status: Former    Years: 4    Types: Cigarettes    Quit date: 09/30/1998    Years since quitting: 24.3   Smokeless  tobacco: Never  Vaping Use   Vaping Use: Never used  Substance Use Topics   Alcohol use: Yes    Alcohol/week: 3.0 standard drinks of alcohol    Types: 3 Standard drinks or equivalent per week    Comment: 5 drinks per week   Drug use: No    Family History  Problem Relation Age of Onset   Heart disease Mother    Alcohol abuse Mother        breast cancer - questionable    Dementia Mother    Alcohol abuse Father    Prostate cancer Father 73       prostate and colon- unsure of primary    Kidney cancer Brother 75   Alcohol abuse Maternal Grandfather    Alcohol abuse Sister    Basal cell carcinoma Sister    Breast cancer Cousin        double mastectomy, d. 68   Prostate cancer Brother 79   Esophageal cancer Neg Hx    Stomach cancer Neg Hx    Rectal cancer Neg Hx     Allergies  Allergen Reactions   Morphine And Related Hives    Medication list has been reviewed and updated.  Current Outpatient Medications on File Prior to Visit  Medication Sig Dispense Refill   ALPRAZolam (XANAX) 0.25 MG tablet Take 1 tablet (0.25 mg total) by mouth 2 (two) times daily as needed for anxiety. 20 tablet 0   amLODipine (NORVASC) 5 MG tablet Take 1 tablet (5 mg total) by mouth daily. 30 tablet 3   anastrozole (ARIMIDEX) 1 MG tablet Take 1 tablet (1 mg total) by mouth daily. 90 tablet 4   cholecalciferol (VITAMIN D3) 25 MCG (1000 UNIT) tablet Take 2 tablets (2,000 Units total) by mouth daily.     COVID-19 mRNA vaccine 2023-2024 (COMIRNATY) syringe Inject into the muscle. 0.3 mL 0   Cyanocobalamin (VITAMIN B 12) 500 MCG TABS Take by mouth.     denosumab (PROLIA) 60 MG/ML SOSY injection Inject 60 mg into the skin every 6 (six) months. 1 mL 0   HYDROcodone-acetaminophen (NORCO/VICODIN) 5-325 MG tablet TAKE 1 OR 2 TABLETS EVERY 6 HOURS AS NEEDED FOR PAIN. 90 tablet 0   losartan (COZAAR) 100 MG tablet Take 1 tablet (100 mg total) by mouth daily. 90 tablet 1   magnesium 30 MG tablet Take 30 mg by mouth  2 (two) times daily.     methenamine (HIPREX) 1 g tablet Take 1 g by mouth 2 (two) times daily with a meal.     traZODone (DESYREL) 50 MG tablet Take 0.5 tablets (25 mg total) by mouth at bedtime as needed for sleep. 135 tablet 1   No current facility-administered medications on file prior to visit.    Review of Systems:  As per HPI- otherwise  negative.   Physical Examination: There were no vitals filed for this visit. There were no vitals filed for this visit. There is no height or weight on file to calculate BMI. Ideal Body Weight:    GEN: no acute distress. HEENT: Atraumatic, Normocephalic.  Ears and Nose: No external deformity. CV: RRR, No M/G/R. No JVD. No thrill. No extra heart sounds. PULM: CTA B, no wheezes, crackles, rhonchi. No retractions. No resp. distress. No accessory muscle use. ABD: S, NT, ND, +BS. No rebound. No HSM. EXTR: No c/c/e PSYCH: Normally interactive. Conversant.    Assessment and Plan: ***  Signed Lamar Blinks, MD

## 2023-01-12 NOTE — Patient Instructions (Incomplete)
It was great to see you again today I will be in touch with your labs asap  Your BP looks good today- continue current medications If you wish to take magnesium that is ok!    Assuming all is well we can plan to visit in about 6 months Have wonderful time in [place] and [place]

## 2023-01-15 ENCOUNTER — Ambulatory Visit (INDEPENDENT_AMBULATORY_CARE_PROVIDER_SITE_OTHER): Payer: Medicare Other | Admitting: Family Medicine

## 2023-01-15 VITALS — BP 132/70 | HR 62 | Temp 97.9°F | Resp 18 | Ht 63.0 in | Wt 124.0 lb

## 2023-01-15 DIAGNOSIS — Z1329 Encounter for screening for other suspected endocrine disorder: Secondary | ICD-10-CM | POA: Diagnosis not present

## 2023-01-15 DIAGNOSIS — E538 Deficiency of other specified B group vitamins: Secondary | ICD-10-CM | POA: Diagnosis not present

## 2023-01-15 DIAGNOSIS — I10 Essential (primary) hypertension: Secondary | ICD-10-CM | POA: Diagnosis not present

## 2023-01-15 LAB — CBC
HCT: 38.5 % (ref 36.0–46.0)
Hemoglobin: 13.1 g/dL (ref 12.0–15.0)
MCHC: 33.9 g/dL (ref 30.0–36.0)
MCV: 97.9 fl (ref 78.0–100.0)
Platelets: 238 10*3/uL (ref 150.0–400.0)
RBC: 3.93 Mil/uL (ref 3.87–5.11)
RDW: 12.9 % (ref 11.5–15.5)
WBC: 4.7 10*3/uL (ref 4.0–10.5)

## 2023-01-15 LAB — TSH: TSH: 2.38 u[IU]/mL (ref 0.35–5.50)

## 2023-01-15 LAB — VITAMIN B12: Vitamin B-12: 512 pg/mL (ref 211–911)

## 2023-01-16 ENCOUNTER — Encounter: Payer: Self-pay | Admitting: Family Medicine

## 2023-01-16 LAB — COMPREHENSIVE METABOLIC PANEL
ALT: 13 U/L (ref 0–35)
AST: 20 U/L (ref 0–37)
Albumin: 4.5 g/dL (ref 3.5–5.2)
Alkaline Phosphatase: 34 U/L — ABNORMAL LOW (ref 39–117)
BUN: 21 mg/dL (ref 6–23)
CO2: 27 mEq/L (ref 19–32)
Calcium: 9.8 mg/dL (ref 8.4–10.5)
Chloride: 101 mEq/L (ref 96–112)
Creatinine, Ser: 1.11 mg/dL (ref 0.40–1.20)
GFR: 47.52 mL/min — ABNORMAL LOW (ref >60.00)
Glucose, Bld: 87 mg/dL (ref 70–99)
Potassium: 4.8 mEq/L (ref 3.5–5.1)
Sodium: 139 mEq/L (ref 135–145)
Total Bilirubin: 0.6 mg/dL (ref 0.2–1.2)
Total Protein: 6.7 g/dL (ref 6.0–8.3)

## 2023-01-17 NOTE — Progress Notes (Signed)
Patient Care Team: [name], Gwenlyn Found, MD as PCP - General (Family Medicine) Magrinat, Valentino Hue, MD (Inactive) as Consulting Physician (Oncology) Almond Lint, MD as Consulting Physician (General Surgery) Adolphus Birchwood, MD as Consulting Physician (Gynecologic Oncology) Antony Blackbird, MD as Consulting Physician (Radiation Oncology) Lavina Hamman, MD as Consulting Physician (Obstetrics and Gynecology) Donnelly Angelica, RN as Nurse Navigator Lu Duffel, Margretta Ditty, RN as Nurse Navigator  DIAGNOSIS: No diagnosis found.  SUMMARY OF ONCOLOGIC HISTORY: Oncology History  Endometrial cancer  09/26/2015 Initial Diagnosis   Endometrial cancer (HCC)   09/21/2020 Cancer Staging   Staging form: Corpus Uteri - Carcinoma, AJCC 7th Edition - Clinical: FIGO Stage IB (T1b, N0, M0) - Signed by Lowella Dell, MD on 09/21/2020   Malignant neoplasm of upper-outer quadrant of left breast in female, estrogen receptor positive  09/21/2020 Initial Diagnosis   Malignant neoplasm of upper-outer quadrant of left breast in female, estrogen receptor positive (HCC)   09/21/2020 Cancer Staging   Staging form: Breast, AJCC 8th Edition - Clinical: Stage IA (cT1c, cN0, cM0, G3, ER+, PR+, HER2: Equivocal) - Signed by Lowella Dell, MD on 09/21/2020    Genetic Testing   Negative genetic testing. No pathogenic variants identified on the [facility] Multi-Cancer Panel +RNA. The report date is 12/17/2020.  The Multi-Cancer Panel + RNA offered by [facility] includes sequencing and/or deletion duplication testing of the following 84 genes: AIP, ALK, APC, ATM, AXIN2,BAP1,  BARD1, BLM, BMPR1A, BRCA1, BRCA2, BRIP1, CASR, CDC73, CDH1, CDK4, CDKN1B, CDKN1C, CDKN2A (p14ARF), CDKN2A (p16INK4a), CEBPA, CHEK2, CTNNA1, DICER1, DIS3L2, EGFR (c.2369C>T, p.Thr790Met variant only), EPCAM (Deletion/duplication testing only), FH, FLCN, GATA2, GPC3, GREM1 (Promoter region deletion/duplication testing only), HOXB13 (c.251G>A, p.Gly84Glu), HRAS,  KIT, MAX, MEN1, MET, MITF (c.952G>A, p.Glu318Lys variant only), MLH1, MSH2, MSH3, MSH6, MUTYH, NBN, NF1, NF2, NTHL1, PALB2, PDGFRA, PHOX2B, PMS2, POLD1, POLE, POT1, PRKAR1A, PTCH1, PTEN, RAD50, RAD51C, RAD51D, RB1, RECQL4, RET, RUNX1, SDHAF2, SDHA (sequence changes only), SDHB, SDHC, SDHD, SMAD4, SMARCA4, SMARCB1, SMARCE1, STK11, SUFU, TERC, TERT, TMEM127, TP53, TSC1, TSC2, VHL, WRN and WT1.     CHIEF COMPLIANT: Follow-up on Prolia and anastrozole  INTERVAL HISTORY: Dawn Powers is a 77 y.o with left breast cancer. Currently on anastrozole. She presents to the clinic for a follow-up.    ALLERGIES:  is allergic to morphine and related.  MEDICATIONS:  Current Outpatient Medications  Medication Sig Dispense Refill   ALPRAZolam (XANAX) 0.25 MG tablet Take 1 tablet (0.25 mg total) by mouth 2 (two) times daily as needed for anxiety. 20 tablet 0   amLODipine (NORVASC) 5 MG tablet Take 1 tablet (5 mg total) by mouth daily. 30 tablet 3   anastrozole (ARIMIDEX) 1 MG tablet Take 1 tablet (1 mg total) by mouth daily. 90 tablet 4   cholecalciferol (VITAMIN D3) 25 MCG (1000 UNIT) tablet Take 2 tablets (2,000 Units total) by mouth daily.     Cyanocobalamin (VITAMIN B 12) 500 MCG TABS Take by mouth.     denosumab (PROLIA) 60 MG/ML SOSY injection Inject 60 mg into the skin every 6 (six) months. 1 mL 0   HYDROcodone-acetaminophen (NORCO/VICODIN) 5-325 MG tablet TAKE 1 OR 2 TABLETS EVERY 6 HOURS AS NEEDED FOR PAIN. 90 tablet 0   losartan (COZAAR) 100 MG tablet Take 1 tablet (100 mg total) by mouth daily. 90 tablet 1   magnesium 30 MG tablet Take 30 mg by mouth 2 (two) times daily.     methenamine (HIPREX) 1 g tablet Take 1 g by mouth 2 (  two) times daily with a meal.     traZODone (DESYREL) 50 MG tablet Take 0.5 tablets (25 mg total) by mouth at bedtime as needed for sleep. 135 tablet 1   No current facility-administered medications for this visit.    PHYSICAL EXAMINATION: ECOG PERFORMANCE STATUS: {CHL  ONC ECOG PS:[phone]}  There were no vitals filed for this visit. There were no vitals filed for this visit.  BREAST:*** No palpable masses or nodules in either right or left breasts. No palpable axillary supraclavicular or infraclavicular adenopathy no breast tenderness or nipple discharge. (exam performed in the presence of a chaperone)  LABORATORY DATA:  I have reviewed the data as listed    Latest Ref Rng & Units 01/15/2023   10:07 AM 11/27/2022   11:46 AM 11/20/2022   10:03 AM  CMP  Glucose 70 - 99 mg/dL 87  92  98   BUN 6 - 23 mg/dL Creatinine 0.40 - 1.20 mg/dL 4.54  0.98  1.19   Sodium 135 - 145 mEq/L 139  139  139   Potassium 3.5 - 5.1 mEq/L 4.8  4.2  4.8   Chloride 96 - 112 mEq/L 101  102  101   CO2 19 - 32 mEq/L Calcium 8.4 - 10.5 mg/dL 9.8  9.3  9.7   Total Protein 6.0 - 8.3 g/dL 6.7  6.6    Total Bilirubin 0.2 - 1.2 mg/dL 0.6  0.5    Alkaline Phos 39 - 117 U/L 34  40    AST 0 - 37 U/L 20  19    ALT 0 - 35 U/L 13  13      Lab Results  Component Value Date   WBC 4.7 01/15/2023   HGB 13.1 01/15/2023   HCT 38.5 01/15/2023   MCV 97.9 01/15/2023   PLT 238.0 01/15/2023   NEUTROABS 3.5 05/08/2021    ASSESSMENT & PLAN:  No problem-specific Assessment & Plan notes found for this encounter.    No orders of the defined types were placed in this encounter.  The patient has a good understanding of the overall plan. she agrees with it. she will call with any problems that may develop before the next visit here. Total time spent: 30 mins including face to face time and time spent for planning, charting and co-ordination of care   Sherlyn Lick, CMA 01/17/23    I Janan Ridge am acting as a Neurosurgeon for [employer]  ***

## 2023-01-20 ENCOUNTER — Inpatient Hospital Stay: Payer: Medicare Other | Attending: Hematology and Oncology | Admitting: Hematology and Oncology

## 2023-01-20 ENCOUNTER — Inpatient Hospital Stay: Payer: Medicare Other

## 2023-01-20 ENCOUNTER — Other Ambulatory Visit: Payer: Self-pay | Admitting: *Deleted

## 2023-01-20 VITALS — BP 143/67 | HR 57 | Temp 97.6°F | Resp 18 | Ht 63.0 in | Wt 123.7 lb

## 2023-01-20 DIAGNOSIS — C541 Malignant neoplasm of endometrium: Secondary | ICD-10-CM | POA: Diagnosis not present

## 2023-01-20 DIAGNOSIS — Z885 Allergy status to narcotic agent status: Secondary | ICD-10-CM | POA: Insufficient documentation

## 2023-01-20 DIAGNOSIS — M810 Age-related osteoporosis without current pathological fracture: Secondary | ICD-10-CM

## 2023-01-20 DIAGNOSIS — M62838 Other muscle spasm: Secondary | ICD-10-CM | POA: Diagnosis not present

## 2023-01-20 DIAGNOSIS — Z79899 Other long term (current) drug therapy: Secondary | ICD-10-CM | POA: Diagnosis not present

## 2023-01-20 DIAGNOSIS — Z79811 Long term (current) use of aromatase inhibitors: Secondary | ICD-10-CM | POA: Insufficient documentation

## 2023-01-20 DIAGNOSIS — R232 Flushing: Secondary | ICD-10-CM | POA: Insufficient documentation

## 2023-01-20 DIAGNOSIS — Z170 Estrogen receptor positive status [ER+]: Secondary | ICD-10-CM

## 2023-01-20 DIAGNOSIS — C50412 Malignant neoplasm of upper-outer quadrant of left female breast: Secondary | ICD-10-CM

## 2023-01-20 DIAGNOSIS — M2560 Stiffness of unspecified joint, not elsewhere classified: Secondary | ICD-10-CM | POA: Diagnosis not present

## 2023-01-20 MED ORDER — DENOSUMAB 60 MG/ML ~~LOC~~ SOSY
60.0000 mg | PREFILLED_SYRINGE | Freq: Once | SUBCUTANEOUS | Status: AC
  Administered 2023-01-20: 60 mg via SUBCUTANEOUS
  Filled 2023-01-20: qty 1

## 2023-01-20 NOTE — Assessment & Plan Note (Addendum)
09/19/2020: Right breast biopsy: T1CN0 stage Ia grade 3 IDC ER/PR positive, HER2 equal vocal, FISH negative, Ki-67 10% (genetics negative) 10/19/2020: Left lumpectomy: Grade 2 IDC negative margins (Oncotype DX: Score 15: 4% ROR) 12/20/2020-01/10/2021: Adjuvant radiation   Current treatment: Anastrozole started neoadjuvantly 09/21/2020 Anastrozole toxicities: Severely dry skin Joint stiffness and achiness 3.  Hair thinning   Breast cancer surveillance: 1.  Breast exam 07/30/2022: Benign 2. mammogram and ultrasound 11/06/2022: No mammographic evidence or sonographic evidence of malignancy at the site of palpable concern in the right breast. 3.  Bone density: 08/13/2022: T score -3.9 (was -3.6): Profound osteoporosis On Prolia every 6 months Muscle spasm at night.   Return to clinic every 6 months for Prolia injections with labs I will see her in 1 year.

## 2023-01-27 ENCOUNTER — Ambulatory Visit: Payer: Medicare Other

## 2023-01-27 ENCOUNTER — Other Ambulatory Visit: Payer: Medicare Other

## 2023-02-04 DIAGNOSIS — S0012XA Contusion of left eyelid and periocular area, initial encounter: Secondary | ICD-10-CM | POA: Diagnosis not present

## 2023-02-04 DIAGNOSIS — H0102A Squamous blepharitis right eye, upper and lower eyelids: Secondary | ICD-10-CM | POA: Diagnosis not present

## 2023-02-04 DIAGNOSIS — H0102B Squamous blepharitis left eye, upper and lower eyelids: Secondary | ICD-10-CM | POA: Diagnosis not present

## 2023-02-04 DIAGNOSIS — H04123 Dry eye syndrome of bilateral lacrimal glands: Secondary | ICD-10-CM | POA: Diagnosis not present

## 2023-02-04 DIAGNOSIS — Z961 Presence of intraocular lens: Secondary | ICD-10-CM | POA: Diagnosis not present

## 2023-02-04 DIAGNOSIS — S0011XA Contusion of right eyelid and periocular area, initial encounter: Secondary | ICD-10-CM | POA: Diagnosis not present

## 2023-02-07 DIAGNOSIS — M25561 Pain in right knee: Secondary | ICD-10-CM | POA: Diagnosis not present

## 2023-03-03 ENCOUNTER — Other Ambulatory Visit: Payer: Self-pay | Admitting: Hematology and Oncology

## 2023-03-06 ENCOUNTER — Other Ambulatory Visit: Payer: Self-pay | Admitting: Family Medicine

## 2023-03-06 DIAGNOSIS — I10 Essential (primary) hypertension: Secondary | ICD-10-CM

## 2023-03-11 ENCOUNTER — Telehealth: Payer: Self-pay | Admitting: Family Medicine

## 2023-03-11 NOTE — Telephone Encounter (Signed)
Copied from CRM [id number]. Topic: Medicare AWV >> Mar 11, 2023 10:18 AM Payton Doughty wrote: Reason for CRM: LM 03/11/2023 to schedule AWV   Verlee Rossetti; Care Guide Ambulatory Clinical Support [place] [facility] Direct Dial: [phone]

## 2023-04-13 IMAGING — MG DIGITAL DIAGNOSTIC BILAT W/ TOMO W/ CAD
9 series · 9 of 25 positions shown · non-contrast
Comparison: Previous exam(s).

CLINICAL DATA: 75-year-old female for annual follow-up. History of
LEFT breast cancer and lumpectomy in September 2020.

EXAM:
DIGITAL DIAGNOSTIC BILATERAL MAMMOGRAM WITH CAD AND TOMO

[L CC]
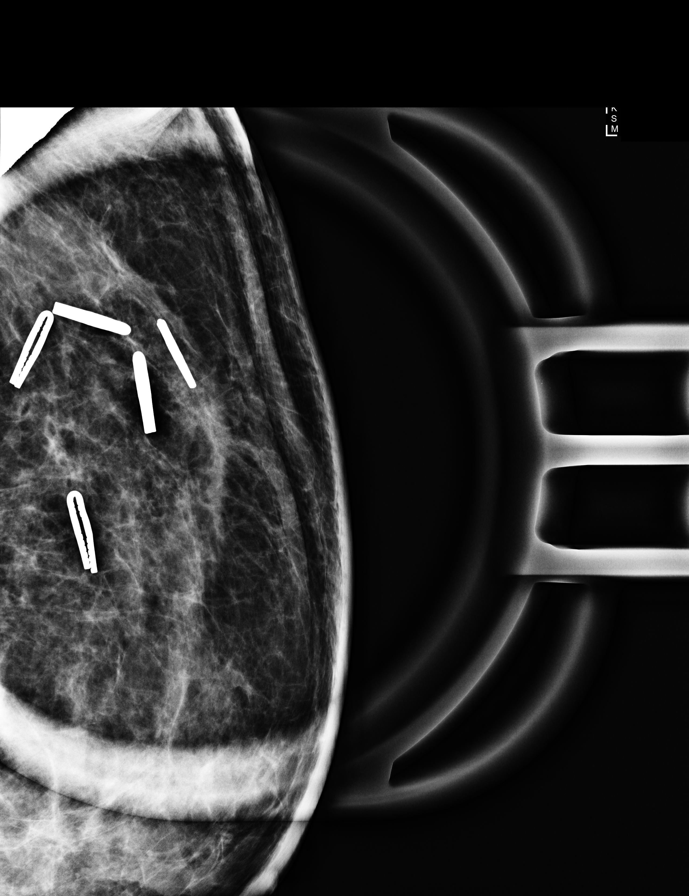

[L CC synth-2D]
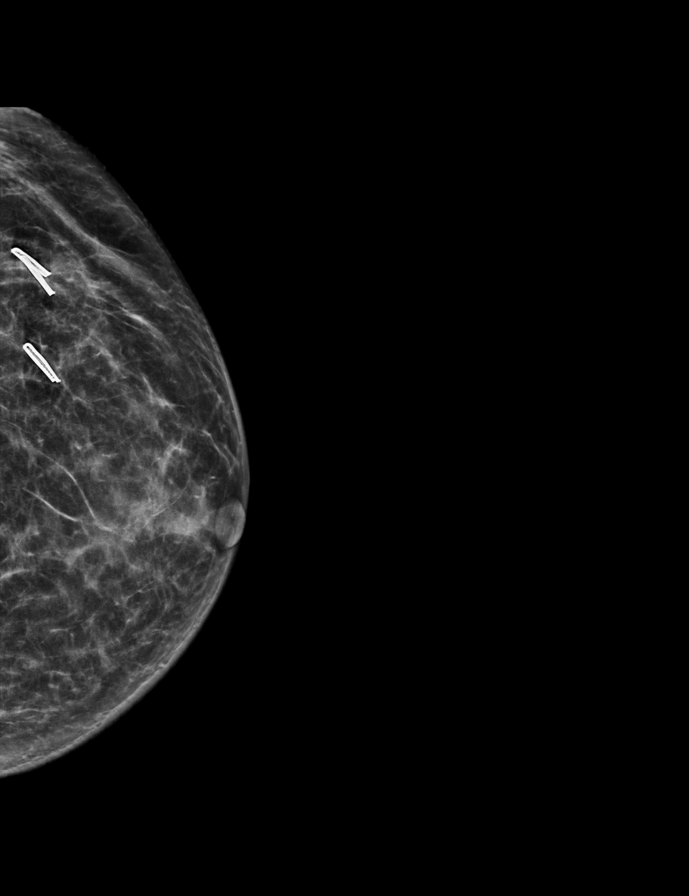

[R MLO synth-2D]
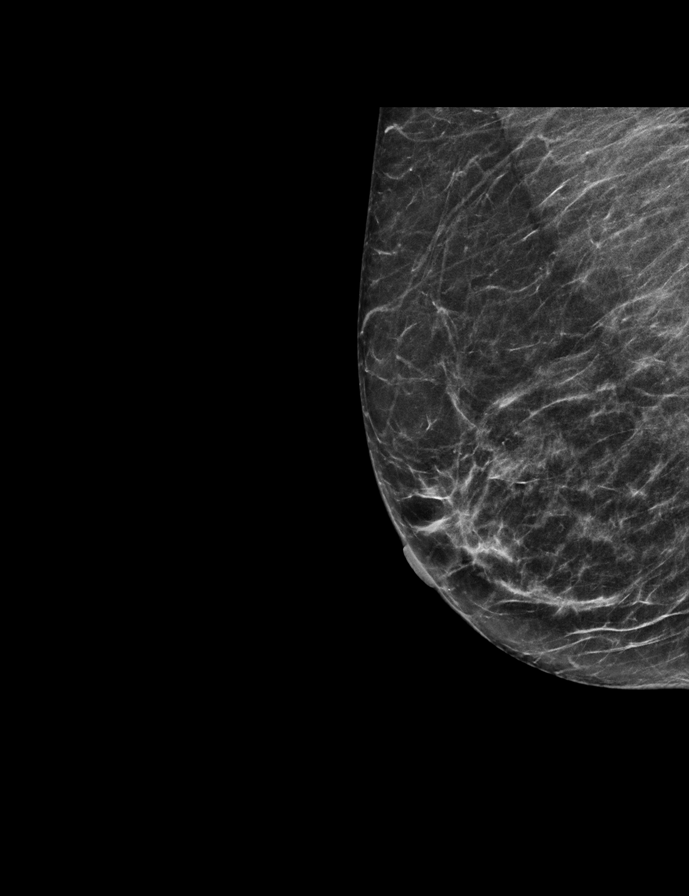

[L MLO synth-2D]
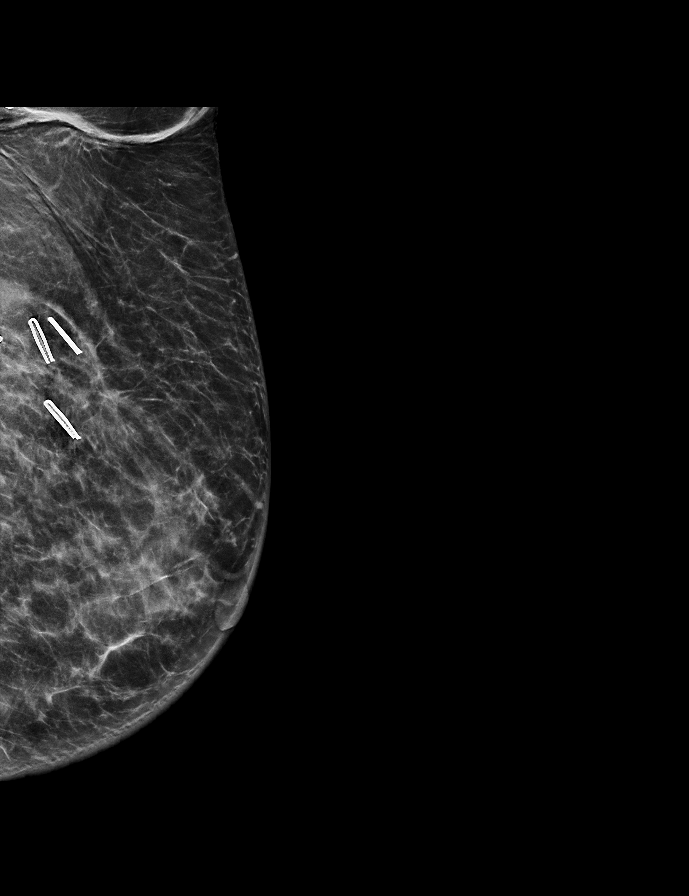

[R CC synth-2D]
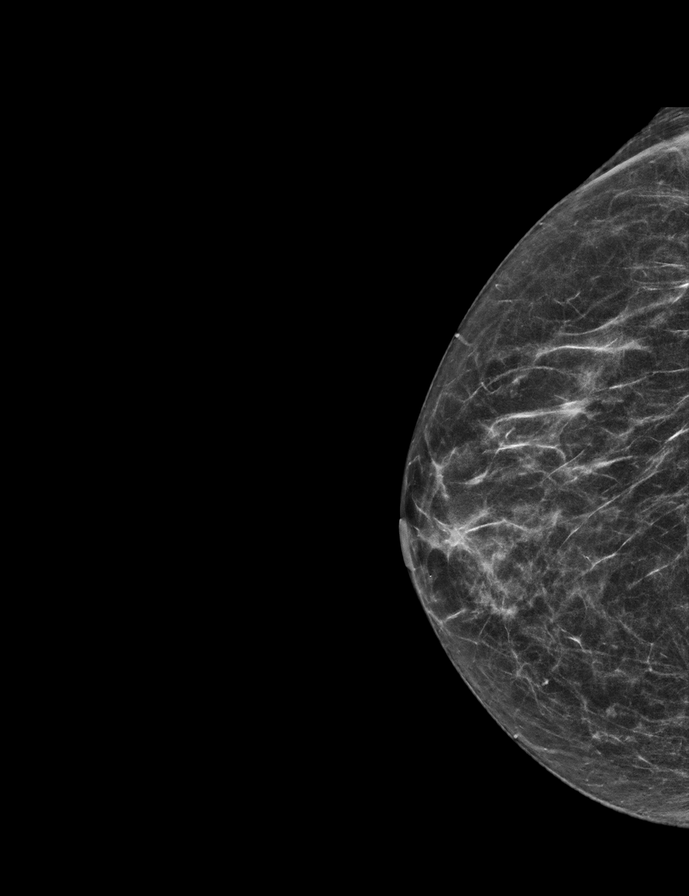

[R MLO tomo · tomo slice 25/50.0]
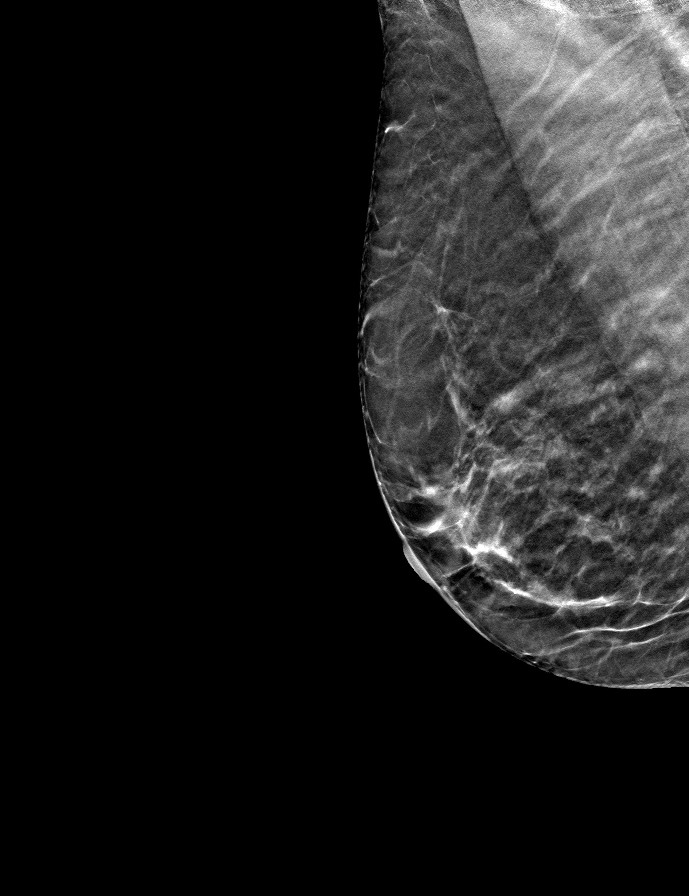

[L CC tomo · tomo slice 29/56.0]
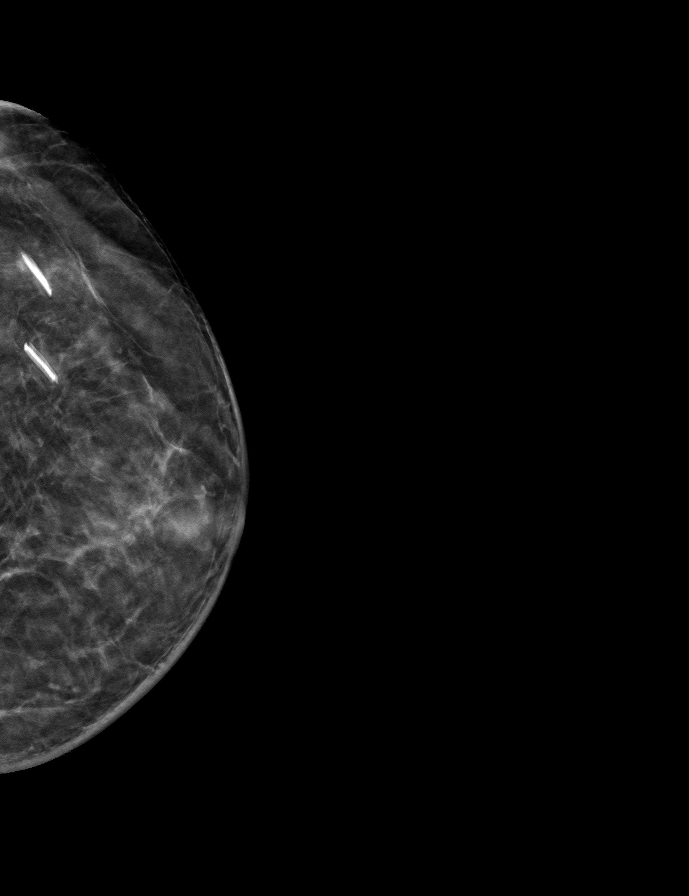

[L MLO tomo · tomo slice 29/56.0]
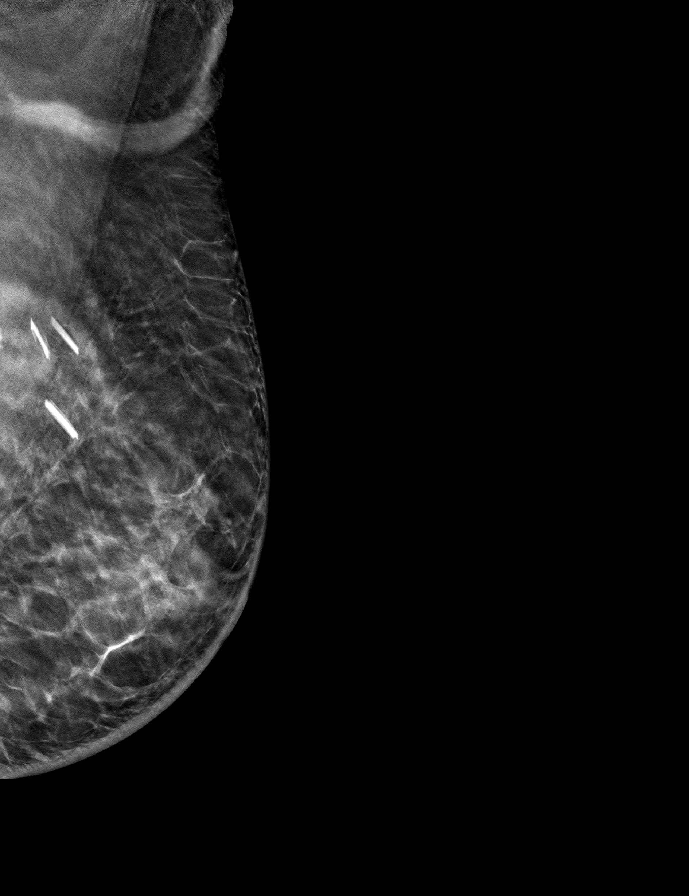

[R CC tomo · tomo slice 26/51.0]
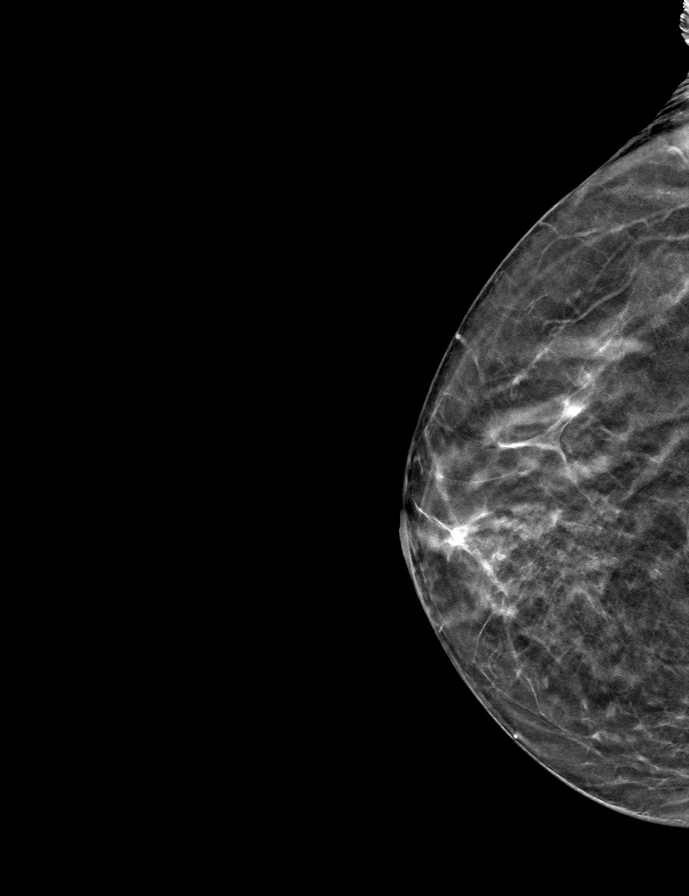

[9 of 25 positions shown; findings below may reference images not displayed]

ACR Breast Density Category c: The breast tissue is heterogeneously
dense, which may obscure small masses.
FINDINGS: 2D and 3D full field views of both breasts and a magnification view
of the lumpectomy site demonstrate no suspicious mass, nonsurgical
distortion or worrisome calcifications.

LEFT lumpectomy changes are noted.

Mammographic images were processed with CAD.
IMPRESSION: No evidence of breast malignancy.

RECOMMENDATION:
Bilateral diagnostic mammogram in 1 year.

I have discussed the findings and recommendations with the patient.
If applicable, a reminder letter will be sent to the patient
regarding the next appointment.

BI-RADS CATEGORY  2: Benign.

## 2023-04-19 ENCOUNTER — Other Ambulatory Visit (HOSPITAL_BASED_OUTPATIENT_CLINIC_OR_DEPARTMENT_OTHER): Payer: Self-pay

## 2023-04-22 ENCOUNTER — Ambulatory Visit (INDEPENDENT_AMBULATORY_CARE_PROVIDER_SITE_OTHER): Payer: Medicare Other | Admitting: Emergency Medicine

## 2023-04-22 VITALS — Ht 63.0 in | Wt 120.0 lb

## 2023-04-22 DIAGNOSIS — Z0000 Encounter for general adult medical examination without abnormal findings: Secondary | ICD-10-CM

## 2023-04-22 NOTE — Patient Instructions (Signed)
Dawn Powers , Thank you for taking time to come for your Medicare Wellness Visit. I appreciate your ongoing commitment to your health goals. Please review the following plan we discussed and let me know if I can assist you in the future.   These are the goals we discussed:  Goals       Patient Stated      Pt would like to strengthen her bones      Patient Stated (pt-stated)      Patient wants to increase her cardio, weight bearing exercises        This is a list of the screening recommended for you and due dates:  Health Maintenance  Topic Date Due   COVID-19 Vaccine (7 - 2023-24 season) 05/14/2023*   Flu Shot  05/01/2023   Mammogram  11/07/2023   Medicare Annual Wellness Visit  04/21/2024   DEXA scan (bone density measurement)  08/13/2024   DTaP/Tdap/Td vaccine (3 - Tdap) 01/15/2032   Pneumonia Vaccine  Completed   Hepatitis C Screening  Completed   Zoster (Shingles) Vaccine  Completed   HPV Vaccine  Aged Out   Colon Cancer Screening  Discontinued  *Topic was postponed. The date shown is not the original due date.    Advanced directives: on file  Conditions/risks identified: You can get a Covid 19 booster from your local pharmacy. Keep up the good work!  Next appointment: Follow up in one year for your annual wellness visit 04/23/24 @ 11am   Preventive Care 65 Years and Older, Female Preventive care refers to lifestyle choices and visits with your health care provider that can promote health and wellness. What does preventive care include? A yearly physical exam. This is also called an annual well check. Dental exams once or twice a year. Routine eye exams. Ask your health care provider how often you should have your eyes checked. Personal lifestyle choices, including: Daily care of your teeth and gums. Regular physical activity. Eating a healthy diet. Avoiding tobacco and drug use. Limiting alcohol use. Practicing safe sex. Taking low-dose aspirin every  day. Taking vitamin and mineral supplements as recommended by your health care provider. What happens during an annual well check? The services and screenings done by your health care provider during your annual well check will depend on your age, overall health, lifestyle risk factors, and family history of disease. Counseling  Your health care provider may ask you questions about your: Alcohol use. Tobacco use. Drug use. Emotional well-being. Home and relationship well-being. Sexual activity. Eating habits. History of falls. Memory and ability to understand (cognition). Work and work Astronomer. Reproductive health. Screening  You may have the following tests or measurements: Height, weight, and BMI. Blood pressure. Lipid and cholesterol levels. These may be checked every 5 years, or more frequently if you are over 61 years old. Skin check. Lung cancer screening. You may have this screening every year starting at age 80 if you have a 30-pack-year history of smoking and currently smoke or have quit within the past 15 years. Fecal occult blood test (FOBT) of the stool. You may have this test every year starting at age 36. Flexible sigmoidoscopy or colonoscopy. You may have a sigmoidoscopy every 5 years or a colonoscopy every 10 years starting at age 32. Hepatitis C blood test. Hepatitis B blood test. Sexually transmitted disease (STD) testing. Diabetes screening. This is done by checking your blood sugar (glucose) after you have not eaten for a while (fasting). You may have  this done every 1-3 years. Bone density scan. This is done to screen for osteoporosis. You may have this done starting at age 79. Mammogram. This may be done every 1-2 years. Talk to your health care provider about how often you should have regular mammograms. Talk with your health care provider about your test results, treatment options, and if necessary, the need for more tests. Vaccines  Your health care  provider may recommend certain vaccines, such as: Influenza vaccine. This is recommended every year. Tetanus, diphtheria, and acellular pertussis (Tdap, Td) vaccine. You may need a Td booster every 10 years. Zoster vaccine. You may need this after age 66. Pneumococcal 13-valent conjugate (PCV13) vaccine. One dose is recommended after age 27. Pneumococcal polysaccharide (PPSV23) vaccine. One dose is recommended after age 49. Talk to your health care provider about which screenings and vaccines you need and how often you need them. This information is not intended to replace advice given to you by your health care provider. Make sure you discuss any questions you have with your health care provider. Document Released: 10/13/2015 Document Revised: 06/05/2016 Document Reviewed: 07/18/2015 Elsevier Interactive Patient Education  2017 [employer].  Fall Prevention in the Home Falls can cause injuries. They can happen to people of all ages. There are many things you can do to make your home safe and to help prevent falls. What can I do on the outside of my home? Regularly fix the edges of walkways and driveways and fix any cracks. Remove anything that might make you trip as you walk through a door, such as a raised step or threshold. Trim any bushes or trees on the path to your home. Use bright outdoor lighting. Clear any walking paths of anything that might make someone trip, such as rocks or tools. Regularly check to see if handrails are loose or broken. Make sure that both sides of any steps have handrails. Any raised decks and porches should have guardrails on the edges. Have any leaves, snow, or ice cleared regularly. Use sand or salt on walking paths during winter. Clean up any spills in your garage right away. This includes oil or grease spills. What can I do in the bathroom? Use night lights. Install grab bars by the toilet and in the tub and shower. Do not use towel bars as grab  bars. Use non-skid mats or decals in the tub or shower. If you need to sit down in the shower, use a plastic, non-slip stool. Keep the floor dry. Clean up any water that spills on the floor as soon as it happens. Remove soap buildup in the tub or shower regularly. Attach bath mats securely with double-sided non-slip rug tape. Do not have throw rugs and other things on the floor that can make you trip. What can I do in the bedroom? Use night lights. Make sure that you have a light by your bed that is easy to reach. Do not use any sheets or blankets that are too big for your bed. They should not hang down onto the floor. Have a firm chair that has side arms. You can use this for support while you get dressed. Do not have throw rugs and other things on the floor that can make you trip. What can I do in the kitchen? Clean up any spills right away. Avoid walking on wet floors. Keep items that you use a lot in easy-to-reach places. If you need to reach something above you, use a strong step stool  that has a grab bar. Keep electrical cords out of the way. Do not use floor polish or wax that makes floors slippery. If you must use wax, use non-skid floor wax. Do not have throw rugs and other things on the floor that can make you trip. What can I do with my stairs? Do not leave any items on the stairs. Make sure that there are handrails on both sides of the stairs and use them. Fix handrails that are broken or loose. Make sure that handrails are as long as the stairways. Check any carpeting to make sure that it is firmly attached to the stairs. Fix any carpet that is loose or worn. Avoid having throw rugs at the top or bottom of the stairs. If you do have throw rugs, attach them to the floor with carpet tape. Make sure that you have a light switch at the top of the stairs and the bottom of the stairs. If you do not have them, ask someone to add them for you. What else can I do to help prevent  falls? Wear shoes that: Do not have high heels. Have rubber bottoms. Are comfortable and fit you well. Are closed at the toe. Do not wear sandals. If you use a stepladder: Make sure that it is fully opened. Do not climb a closed stepladder. Make sure that both sides of the stepladder are locked into place. Ask someone to hold it for you, if possible. Clearly mark and make sure that you can see: Any grab bars or handrails. First and last steps. Where the edge of each step is. Use tools that help you move around (mobility aids) if they are needed. These include: Canes. Walkers. Scooters. Crutches. Turn on the lights when you go into a dark area. Replace any light bulbs as soon as they burn out. Set up your furniture so you have a clear path. Avoid moving your furniture around. If any of your floors are uneven, fix them. If there are any pets around you, be aware of where they are. Review your medicines with your doctor. Some medicines can make you feel dizzy. This can increase your chance of falling. Ask your doctor what other things that you can do to help prevent falls. This information is not intended to replace advice given to you by your health care provider. Make sure you discuss any questions you have with your health care provider. Document Released: 07/13/2009 Document Revised: 02/22/2016 Document Reviewed: 10/21/2014 Elsevier Interactive Patient Education  2017 [employer].

## 2023-04-22 NOTE — Progress Notes (Signed)
Subjective:   Per patient no change in vitals since last visit; unable to obtain new vitals due to this being a telehealth visit. Patient was unable to self-report vital signs via telehealth due to a lack of equipment at home.   Dawn Powers is a 77 y.o. female who presents for Medicare Annual (Subsequent) preventive examination.  Visit Complete: Virtual  I connected with  Dawn Powers on 04/22/23 by a audio enabled telemedicine application and verified that I am speaking with the correct person using two identifiers.  Patient Location: Home  Provider Location: Home Office  I discussed the limitations of evaluation and management by telemedicine. The patient expressed understanding and agreed to proceed.   Review of Systems     Cardiac Risk Factors include: advanced age (>38men, >29 women);dyslipidemia;hypertension     Objective:    Today's Vitals   04/22/23 1127  Weight: 120 lb (54.4 kg)  Height: 5\' 3"  (1.6 m)   Body mass index is 21.26 kg/m.     04/22/2023   11:47 AM 11/23/2021   10:30 AM 11/21/2020   12:45 PM 10/30/2020    3:08 PM 10/19/2020   11:11 AM 10/13/2020    2:43 PM 10/10/2020    3:54 PM  Advanced Directives  Does Patient Have a Medical Advance Directive? Yes Yes Yes Yes Yes Yes Yes  Type of Estate agent of [place];Living will  Healthcare Power of [place];Living will Living will;Healthcare Power of Attorney Living will;Healthcare Power of Attorney Living will;Healthcare Power of [employer] Power of [place];Living will  Does patient want to make changes to medical advance directive? No - Patient declined  No - Patient declined No - Patient declined No - Patient declined No - Patient declined   Copy of Healthcare Power of Attorney in Chart? Yes - validated most recent copy scanned in chart (See row information)  Yes - validated most recent copy scanned in chart (See row information) Yes - validated most recent copy scanned in  chart (See row information)  Yes - validated most recent copy scanned in chart (See row information) Yes - validated most recent copy scanned in chart (See row information)    Current Medications (verified) Outpatient Encounter Medications as of 04/22/2023  Medication Sig   amLODipine (NORVASC) 5 MG tablet Take 1 tablet (5 mg total) by mouth daily.   anastrozole (ARIMIDEX) 1 MG tablet Take 1 tablet (1 mg total) by mouth daily.   cholecalciferol (VITAMIN D3) 25 MCG (1000 UNIT) tablet Take 2 tablets (2,000 Units total) by mouth daily.   Cranberry 250 MG CAPS Take 1 capsule by mouth daily.   Cyanocobalamin (VITAMIN B 12) 500 MCG TABS Take by mouth.   denosumab (PROLIA) 60 MG/ML SOSY injection Inject 60 mg into the skin every 6 (six) months.   HYDROcodone-acetaminophen (NORCO/VICODIN) 5-325 MG tablet TAKE 1 OR 2 TABLETS EVERY 6 HOURS AS NEEDED FOR PAIN.   losartan (COZAAR) 100 MG tablet Take 1 tablet (100 mg total) by mouth daily.   Probiotic Product (PROBIOTIC DAILY PO) Take 1 tablet by mouth daily.   traZODone (DESYREL) 50 MG tablet Take 0.5 tablets (25 mg total) by mouth at bedtime as needed for sleep.   ALPRAZolam (XANAX) 0.25 MG tablet Take 1 tablet (0.25 mg total) by mouth 2 (two) times daily as needed for anxiety. (Patient not taking: Reported on 04/22/2023)   magnesium 30 MG tablet Take 30 mg by mouth 2 (two) times daily. (Patient not taking: Reported on  04/22/2023)   methenamine (HIPREX) 1 g tablet Take 1 g by mouth 2 (two) times daily with a meal. (Patient not taking: Reported on 04/22/2023)   No facility-administered encounter medications on file as of 04/22/2023.    Allergies (verified) Morphine   History: Past Medical History:  Diagnosis Date   Anxiety    Arthritis    Breast cancer (HCC) 09/19/2020   left breast [facility] w/ radiation no chemo   Cancer (HCC)    Endometrial    Family history of breast cancer    Family history of kidney cancer    Family history of prostate cancer     Heart murmur    Hyperlipidemia    Hypertension    Neuromuscular disorder (HCC)    DDD   Osteoporosis 06/12/2021   Pain    lower back -hx "DDD"   Personal history of radiation therapy    left [facility] 08/2020 w/ radiation no chemo   Radiation 11/15/15, 11/23/15, 11/30/15, 12/07/15, 12/14/15   [facility]DR brachytherapy   Past Surgical History:  Procedure Laterality Date   ABDOMINAL HYSTERECTOMY     BREAST LUMPECTOMY Left 10/19/2018   left lumpectomy 10-19-20   BREAST LUMPECTOMY WITH RADIOACTIVE SEED AND SENTINEL LYMPH NODE BIOPSY Left 10/19/2020   Procedure: LEFT BREAST LUMPECTOMY WITH RADIOACTIVE SEED AND SENTINEL LYMPH NODE BIOPSY;  Surgeon: Almond Lint, MD;  Location: [facility];  Service: General;  Laterality: Left;  PEC BLOCK, RNFA   CATARACT EXTRACTION Bilateral    COLONOSCOPY     FRACTURE SURGERY Right    ankle   GANGLION CYST EXCISION     LYMPH NODE BIOPSY N/A 09/26/2015   Procedure: SENTINEL LYMPH NODE BIOPSY;  Surgeon: Adolphus Birchwood, MD;  Location: [facility];  Service: Gynecology;  Laterality: N/A;   ROBOTIC ASSISTED TOTAL HYSTERECTOMY WITH BILATERAL SALPINGO OOPHERECTOMY Bilateral 09/26/2015   Procedure: ROBOTIC ASSISTED TOTAL HYSTERECTOMY WITH BILATERAL SALPINGO OOPHORECTOMY;  Surgeon: Adolphus Birchwood, MD;  Location: [facility];  Service: Gynecology;  Laterality: Bilateral;   TOTAL SHOULDER ARTHROPLASTY Left 09/10/2018   Procedure: TOTAL SHOULDER ARTHROPLASTY;  Surgeon: Francena Hanly, MD;  Location: [facility];  Service: Orthopedics;  Laterality: Left;    WISDOM TOOTH EXTRACTION     WISDOM TOOTH EXTRACTION     Family History  Problem Relation Age of Onset   Heart disease Mother    Alcohol abuse Mother        breast cancer - questionable    Dementia Mother    Alcohol abuse Father    Prostate cancer Father 69       prostate and colon- unsure of primary    Kidney cancer Brother 50   Alcohol abuse Maternal Grandfather    Alcohol abuse Sister    Basal cell carcinoma Sister    Breast  cancer Cousin        double mastectomy, d. 55   Prostate cancer Brother 7   Esophageal cancer Neg Hx    Stomach cancer Neg Hx    Rectal cancer Neg Hx    Social History   Socioeconomic History   Marital status: Married    Spouse name: Ethelene Browns   Number of children: 2   Years of education: Not on file   Highest education level: Not on file  Occupational History   Occupation: retired Manufacturing engineer  Tobacco Use   Smoking status: Former    Current packs/day: 0.00    Types: Cigarettes    Start date: 09/30/1994    Quit date:  09/30/1998    Years since quitting: 24.5   Smokeless tobacco: Never  Vaping Use   Vaping status: Never Used  Substance and Sexual Activity   Alcohol use: Yes    Alcohol/week: 3.0 standard drinks of alcohol    Types: 3 Standard drinks or equivalent per week    Comment: 1 drink 3 times per week   Drug use: No   Sexual activity: Yes    Partners: Female    Birth control/protection: Surgical  Other Topics Concern   Not on file  Social History Narrative   Married. Education: college and Other. Exercise 4 times a week for 50 minutes.   Social Determinants of Health   Financial Resource Strain: Low Risk  (04/22/2023)   Overall Financial Resource Strain (CARDIA)    Difficulty of Paying Living Expenses: Not hard at all  Food Insecurity: No Food Insecurity (04/22/2023)   Hunger Vital Sign    Worried About Running Out of Food in the Last Year: Never true    Ran Out of Food in the Last Year: Never true  Transportation Needs: No Transportation Needs (04/22/2023)   PRAPARE - Administrator, Civil Service (Medical): No    Lack of Transportation (Non-Medical): No  Physical Activity: Sufficiently Active (04/22/2023)   Exercise Vital Sign    Days of Exercise per Week: 7 days    Minutes of Exercise per Session: 30 min  Stress: Stress Concern Present (04/22/2023)   [employer] of Occupational Health - Occupational Stress Questionnaire    Feeling of  Stress : Rather much  Social Connections: Socially Integrated (04/22/2023)   Social Connection and Isolation Panel [NHANES]    Frequency of Communication with Friends and Family: More than three times a week    Frequency of Social Gatherings with Friends and Family: Once a week    Attends Religious Services: More than 4 times per year    Active Member of [employer] or Organizations: Yes    Attends Engineer, structural: More than 4 times per year    Marital Status: Married    Tobacco Counseling Counseling given: Not Answered   Clinical Intake:  Pre-visit preparation completed: Yes  Pain : No/denies pain     BMI - recorded: 21.26 Nutritional Status: BMI of 19-24  Normal Nutritional Risks: None Diabetes: No  How often do you need to have someone help you when you read instructions, pamphlets, or other written materials from your doctor or pharmacy?: 1 - Never  Interpreter Needed?: No  Information entered by :: Tora Kindred, CMA   Activities of Daily Living    04/22/2023   11:30 AM  In your present state of health, do you have any difficulty performing the following activities:  Hearing? 0  Vision? 0  Difficulty concentrating or making decisions? 0  Walking or climbing stairs? 0  Dressing or bathing? 0  Doing errands, shopping? 0  Preparing Food and eating ? N  Using the Toilet? N  In the past six months, have you accidently leaked urine? Y  Comment patient has had 4 UTIs in the last 8 months, saw urology  Do you have problems with loss of bowel control? N  Managing your Medications? N  Managing your Finances? N  Housekeeping or managing your Housekeeping? N    Patient Care Team: [name], Gwenlyn Found, MD as PCP - General (Family Medicine) Magrinat, Valentino Hue, MD (Inactive) as Consulting Physician (Oncology) Almond Lint, MD as Consulting Physician (General Surgery) Adolphus Birchwood,  MD as Consulting Physician (Gynecologic Oncology) Antony Blackbird, MD as Consulting  Physician (Radiation Oncology) Lavina Hamman, MD as Consulting Physician (Obstetrics and Gynecology) Donnelly Angelica, RN as Nurse Navigator Lu Duffel, Margretta Ditty, RN as Nurse Navigator  Indicate any recent Medical Services you may have received from other than [facility] providers in the past year (date may be approximate).     Assessment:   This is a routine wellness examination for Adeline.  Hearing/Vision screen Hearing Screening - Comments:: Denies hearing loss  Dietary issues and exercise activities discussed:     Goals Addressed               This Visit's Progress     Patient Stated (pt-stated)        Patient wants to increase her cardio, weight bearing exercises      Depression Screen    04/22/2023   11:45 AM 07/17/2022    9:52 AM 11/23/2021   10:45 AM 11/23/2021   10:44 AM 07/31/2020   10:06 AM 12/12/2016    4:59 PM 04/11/2016   11:33 AM  PHQ 2/9 Scores  PHQ - 2 Score 0 1 0 0 0 0 0  PHQ- 9 Score 0 3         Fall Risk    04/22/2023   11:48 AM 11/13/2022   11:34 AM 07/17/2022    9:52 AM 11/23/2021   10:37 AM 07/31/2020   10:04 AM  Fall Risk   Falls in the past year? 0 0 0 0 0  Number falls in past yr: 0 0 0 0 0  Injury with Fall? 0 0 0 0 0  Risk for fall due to : No Fall Risks Orthopedic patient  No Fall Risks   Follow up Falls prevention discussed  Falls evaluation completed Falls prevention discussed     MEDICARE RISK AT HOME:  Medicare Risk at Home - 04/22/23 1148     Any stairs in or around the home? Yes    If so, are there any without handrails? No    Home free of loose throw rugs in walkways, pet beds, electrical cords, etc? No    Adequate lighting in your home to reduce risk of falls? Yes    Life alert? No    Use of a cane, walker or w/c? No    Grab bars in the bathroom? No    Shower chair or bench in shower? No    Elevated toilet seat or a handicapped toilet? Yes             TIMED UP AND GO:  Was the test performed?  No    Cognitive  Function:        04/22/2023   11:49 AM  6CIT Screen  What Year? 0 points  What month? 0 points  What time? 0 points  Count back from 20 0 points  Months in reverse 0 points  Repeat phrase 0 points  Total Score 0 points    Immunizations Immunization History  Administered Date(s) Administered   COVID-19, mRNA, vaccine(Comirnaty)12 years and older 09/05/2022   Fluad Quad(high Dose 65+) 07/01/2019, 07/10/2020, 07/16/2021, 07/17/2022   Hepatitis A, Adult 07/09/2013, 01/22/2014   Hepatitis B, ADULT 07/09/2013, 08/16/2013, 01/22/2014   Influenza Split 07/30/2012   Influenza, High Dose Seasonal PF 06/05/2016, 06/30/2017   Influenza,inj,Quad PF,6+ Mos 07/19/2014, 05/23/2015   Influenza,inj,quad, With Preservative 05/31/2017   Influenza-Unspecified 07/15/2018, 06/03/2019   Meningococcal polysaccharide vaccine (MPSV4) 01/02/2010   PFIZER(Purple Top)SARS-COV-2 Vaccination 10/11/2019,  10/30/2019, 05/25/2020   Pfizer Covid-19 Vaccine Bivalent Booster 75yrs & up 06/30/2021, 01/31/2022   Pneumococcal Conjugate-13 05/17/2014   Pneumococcal Polysaccharide-23 01/02/2010   Td 01/27/2009, 01/14/2022   Zoster Recombinant(Shingrix) 06/02/2019, 08/18/2019   Zoster, Live 09/30/2009    TDAP status: Up to date  Flu Vaccine status: Up to date  Pneumococcal vaccine status: Up to date  Covid-19 vaccine status: Information provided on how to obtain vaccines.   Qualifies for Shingles Vaccine? Yes   Zostavax completed No   Shingrix Completed?: Yes  Screening Tests Health Maintenance  Topic Date Due   COVID-19 Vaccine (7 - 2023-24 season) 05/14/2023 (Originally 10/31/2022)   INFLUENZA VACCINE  05/01/2023   MAMMOGRAM  11/07/2023   Medicare Annual Wellness (AWV)  04/21/2024   DEXA SCAN  08/13/2024   DTaP/Tdap/Td (3 - Tdap) 01/15/2032   Pneumonia Vaccine 78+ Years old  Completed   Hepatitis C Screening  Completed   Zoster Vaccines- Shingrix  Completed   HPV VACCINES  Aged Out   Colonoscopy   Discontinued    Health Maintenance  There are no preventive care reminders to display for this patient.   Colorectal cancer screening: No longer required.   Mammogram status: Completed 11/06/22. Repeat every year  Bone Density status: Completed 08/13/22. Results reflect: Bone density results: OSTEOPOROSIS. Repeat every 2 years.  Lung Cancer Screening: (Low Dose CT Chest recommended if Age 38-80 years, 20 pack-year currently smoking OR have quit w/in 15years.) does not qualify.   Lung Cancer Screening Referral: n/a  Additional Screening:  Hepatitis C Screening: does not qualify; Completed 05/23/15   Dental Screening: Recommended annual dental exams for proper oral hygiene   Community Resource Referral / Chronic Care Management: CRR required this visit?  No   CCM required this visit?  No     Plan:     I have personally reviewed and noted the following in the patient's chart:   Medical and social history Use of alcohol, tobacco or illicit drugs  Current medications and supplements including opioid prescriptions. Patient is currently taking opioid prescriptions. Information provided to patient regarding non-opioid alternatives. Patient advised to discuss non-opioid treatment plan with their provider. Functional ability and status Nutritional status Physical activity Advanced directives List of other physicians Hospitalizations, surgeries, and ER visits in previous 12 months Vitals Screenings to include cognitive, depression, and falls Referrals and appointments  In addition, I have reviewed and discussed with patient certain preventive protocols, quality metrics, and best practice recommendations. A written personalized care plan for preventive services as well as general preventive health recommendations were provided to patient.     Tora Kindred, [facility]   04/22/2023   After Visit Summary: (MyChart) Due to this being a telephonic visit, the after visit summary with  patients personalized plan was offered to patient via MyChart   Nurse Notes:  Patient doing well. She does states she is under stress due to a sick daughter. Patient declined any help with managing her stress.

## 2023-05-06 ENCOUNTER — Other Ambulatory Visit: Payer: Self-pay | Admitting: Family Medicine

## 2023-05-06 DIAGNOSIS — I10 Essential (primary) hypertension: Secondary | ICD-10-CM

## 2023-05-07 DIAGNOSIS — Z961 Presence of intraocular lens: Secondary | ICD-10-CM | POA: Diagnosis not present

## 2023-05-07 DIAGNOSIS — H0102B Squamous blepharitis left eye, upper and lower eyelids: Secondary | ICD-10-CM | POA: Diagnosis not present

## 2023-05-07 DIAGNOSIS — H04123 Dry eye syndrome of bilateral lacrimal glands: Secondary | ICD-10-CM | POA: Diagnosis not present

## 2023-05-07 DIAGNOSIS — H10413 Chronic giant papillary conjunctivitis, bilateral: Secondary | ICD-10-CM | POA: Diagnosis not present

## 2023-05-07 DIAGNOSIS — H0102A Squamous blepharitis right eye, upper and lower eyelids: Secondary | ICD-10-CM | POA: Diagnosis not present

## 2023-05-07 DIAGNOSIS — H18453 Nodular corneal degeneration, bilateral: Secondary | ICD-10-CM | POA: Diagnosis not present

## 2023-05-07 DIAGNOSIS — H16141 Punctate keratitis, right eye: Secondary | ICD-10-CM | POA: Diagnosis not present

## 2023-05-19 ENCOUNTER — Other Ambulatory Visit: Payer: Self-pay | Admitting: Family Medicine

## 2023-05-19 DIAGNOSIS — G4700 Insomnia, unspecified: Secondary | ICD-10-CM

## 2023-05-25 ENCOUNTER — Other Ambulatory Visit: Payer: Self-pay | Admitting: Family Medicine

## 2023-05-25 DIAGNOSIS — I10 Essential (primary) hypertension: Secondary | ICD-10-CM

## 2023-05-26 NOTE — Telephone Encounter (Signed)
Pt called stating that she is going on a trip and needs this medication available before she leaves or right after she returns. Pt stated she has enough to get her through the trip but she will be out on the day she returns.  Pt Leaves: 9.5.24 Pt Returns: 9.14.24  Pt would like a call back to let her know how how it will be sent in.

## 2023-05-29 DIAGNOSIS — Z23 Encounter for immunization: Secondary | ICD-10-CM | POA: Diagnosis not present

## 2023-06-19 DIAGNOSIS — R0981 Nasal congestion: Secondary | ICD-10-CM | POA: Diagnosis not present

## 2023-06-27 ENCOUNTER — Other Ambulatory Visit (HOSPITAL_BASED_OUTPATIENT_CLINIC_OR_DEPARTMENT_OTHER): Payer: Self-pay

## 2023-06-27 DIAGNOSIS — Z23 Encounter for immunization: Secondary | ICD-10-CM | POA: Diagnosis not present

## 2023-06-27 MED ORDER — INFLUENZA VAC A&B SURF ANT ADJ 0.5 ML IM SUSY
0.5000 mL | PREFILLED_SYRINGE | Freq: Once | INTRAMUSCULAR | 0 refills | Status: AC
  Filled 2023-06-27: qty 0.5, 1d supply, fill #0

## 2023-07-29 ENCOUNTER — Inpatient Hospital Stay: Payer: Medicare Other | Attending: Hematology and Oncology

## 2023-07-29 ENCOUNTER — Inpatient Hospital Stay: Payer: Medicare Other

## 2023-07-29 VITALS — BP 155/56 | HR 64 | Resp 17

## 2023-07-29 DIAGNOSIS — Z170 Estrogen receptor positive status [ER+]: Secondary | ICD-10-CM | POA: Insufficient documentation

## 2023-07-29 DIAGNOSIS — C50412 Malignant neoplasm of upper-outer quadrant of left female breast: Secondary | ICD-10-CM | POA: Insufficient documentation

## 2023-07-29 DIAGNOSIS — Z79811 Long term (current) use of aromatase inhibitors: Secondary | ICD-10-CM | POA: Diagnosis not present

## 2023-07-29 DIAGNOSIS — M810 Age-related osteoporosis without current pathological fracture: Secondary | ICD-10-CM

## 2023-07-29 DIAGNOSIS — Z79899 Other long term (current) drug therapy: Secondary | ICD-10-CM | POA: Insufficient documentation

## 2023-07-29 LAB — CBC WITH DIFFERENTIAL (CANCER CENTER ONLY)
Abs Immature Granulocytes: 0 10*3/uL (ref 0.00–0.07)
Basophils Absolute: 0 10*3/uL (ref 0.0–0.1)
Basophils Relative: 1 %
Eosinophils Absolute: 0.1 10*3/uL (ref 0.0–0.5)
Eosinophils Relative: 2 %
HCT: 36.6 % (ref 36.0–46.0)
Hemoglobin: 12.5 g/dL (ref 12.0–15.0)
Immature Granulocytes: 0 %
Lymphocytes Relative: 22 %
Lymphs Abs: 1 10*3/uL (ref 0.7–4.0)
MCH: 32.6 pg (ref 26.0–34.0)
MCHC: 34.2 g/dL (ref 30.0–36.0)
MCV: 95.3 fL (ref 80.0–100.0)
Monocytes Absolute: 0.4 10*3/uL (ref 0.1–1.0)
Monocytes Relative: 8 %
Neutro Abs: 3.3 10*3/uL (ref 1.7–7.7)
Neutrophils Relative %: 67 %
Platelet Count: 248 10*3/uL (ref 150–400)
RBC: 3.84 MIL/uL — ABNORMAL LOW (ref 3.87–5.11)
RDW: 12.2 % (ref 11.5–15.5)
WBC Count: 4.8 10*3/uL (ref 4.0–10.5)
nRBC: 0 % (ref 0.0–0.2)

## 2023-07-29 LAB — CMP (CANCER CENTER ONLY)
ALT: 11 U/L (ref 0–44)
AST: 18 U/L (ref 15–41)
Albumin: 4.5 g/dL (ref 3.5–5.0)
Alkaline Phosphatase: 37 U/L — ABNORMAL LOW (ref 38–126)
Anion gap: 5 (ref 5–15)
BUN: 20 mg/dL (ref 8–23)
CO2: 31 mmol/L (ref 22–32)
Calcium: 9.9 mg/dL (ref 8.9–10.3)
Chloride: 103 mmol/L (ref 98–111)
Creatinine: 1.03 mg/dL — ABNORMAL HIGH (ref 0.44–1.00)
GFR, Estimated: 55 mL/min — ABNORMAL LOW (ref >60)
Glucose, Bld: 117 mg/dL — ABNORMAL HIGH (ref 70–99)
Potassium: 3.8 mmol/L (ref 3.5–5.1)
Sodium: 139 mmol/L (ref 135–145)
Total Bilirubin: 0.6 mg/dL (ref 0.3–1.2)
Total Protein: 7 g/dL (ref 6.5–8.1)

## 2023-07-29 MED ORDER — DENOSUMAB 60 MG/ML ~~LOC~~ SOSY
60.0000 mg | PREFILLED_SYRINGE | Freq: Once | SUBCUTANEOUS | Status: AC
  Administered 2023-07-29: 60 mg via SUBCUTANEOUS
  Filled 2023-07-29: qty 1

## 2023-07-29 NOTE — Patient Instructions (Signed)
Denosumab Injection (Osteoporosis) What is this medication? DENOSUMAB (den oh SUE mab) prevents and treats osteoporosis. It works by making your bones stronger and less likely to break (fracture). It is a monoclonal antibody. This medicine may be used for other purposes; ask your health care provider or pharmacist if you have questions. COMMON BRAND NAME(S): Prolia What should I tell my care team before I take this medication? They need to know if you have any of these conditions: Dental or gum disease Had thyroid or parathyroid (glands located in neck) surgery Having dental surgery or a tooth pulled Kidney disease Low levels of calcium in the blood On dialysis Poor nutrition Thyroid disease Trouble absorbing nutrients from your food An unusual or allergic reaction to denosumab, other medications, foods, dyes, or preservatives Pregnant or trying to get pregnant Breastfeeding How should I use this medication? This medication is injected under the skin. It is given by your care team in a hospital or clinic setting. A special MedGuide will be given to you before each treatment. Be sure to read this information carefully each time. Talk to your care team about the use of this medication in children. Special care may be needed. Overdosage: If you think you have taken too much of this medicine contact a poison control center or emergency room at once. NOTE: This medicine is only for you. Do not share this medicine with others. What if I miss a dose? Keep appointments for follow-up doses. It is important not to miss your dose. Call your care team if you are unable to keep an appointment. What may interact with this medication? Do not take this medication with any of the following: Other medications that contain denosumab This medication may also interact with the following: Medications that lower your chance of fighting infection Steroid medications, such as prednisone or cortisone This  list may not describe all possible interactions. Give your health care provider a list of all the medicines, herbs, non-prescription drugs, or dietary supplements you use. Also tell them if you smoke, drink alcohol, or use illegal drugs. Some items may interact with your medicine. What should I watch for while using this medication? Your condition will be monitored carefully while you are receiving this medication. You may need blood work done while taking this medication. This medication may increase your risk of getting an infection. Call your care team for advice if you get a fever, chills, sore throat, or other symptoms of a cold or flu. Do not treat yourself. Try to avoid being around people who are sick. Tell your dentist and dental surgeon that you are taking this medication. You should not have major dental surgery while on this medication. See your dentist to have a dental exam and fix any dental problems before starting this medication. Take good care of your teeth while on this medication. Make sure you see your dentist for regular follow-up appointments. This medication may cause low levels of calcium in your body. The risk of severe side effects is increased in people with kidney disease. Your care team may prescribe calcium and vitamin D to help prevent low calcium levels while you take this medication. It is important to take calcium and vitamin D as directed by your care team. Talk to your care team if you may be pregnant. Serious birth defects may occur if you take this medication during pregnancy and for 5 months after the last dose. You will need a negative pregnancy test before starting this medication. Contraception   is recommended while taking this medication and for 5 months after the last dose. Your care team can help you find the option that works for you. Talk to your care team before breastfeeding. Changes to your treatment plan may be needed. What side effects may I notice from  receiving this medication? Side effects that you should report to your care team as soon as possible: Allergic reactions--skin rash, itching, hives, swelling of the face, lips, tongue, or throat Infection--fever, chills, cough, sore throat, wounds that don't heal, pain or trouble when passing urine, general feeling of discomfort or being unwell Low calcium level--muscle pain or cramps, confusion, tingling, or numbness in the hands or feet Osteonecrosis of the jaw--pain, swelling, or redness in the mouth, numbness of the jaw, poor healing after dental work, unusual discharge from the mouth, visible bones in the mouth Severe bone, joint, or muscle pain Skin infection--skin redness, swelling, warmth, or pain Side effects that usually do not require medical attention (report these to your care team if they continue or are bothersome): Back pain Headache Joint pain Muscle pain Pain in the hands, arms, legs, or feet Runny or stuffy nose Sore throat This list may not describe all possible side effects. Call your doctor for medical advice about side effects. You may report side effects to FDA at 1-800-FDA-1088. Where should I keep my medication? This medication is given in a hospital or clinic. It will not be stored at home. NOTE: This sheet is a summary. It may not cover all possible information. If you have questions about this medicine, talk to your doctor, pharmacist, or health care provider.  2024 Elsevier/Gold Standard (2022-10-22 00:00:00)  

## 2023-07-30 ENCOUNTER — Telehealth: Payer: Self-pay | Admitting: *Deleted

## 2023-07-30 ENCOUNTER — Encounter: Payer: Self-pay | Admitting: Family Medicine

## 2023-07-30 DIAGNOSIS — G894 Chronic pain syndrome: Secondary | ICD-10-CM

## 2023-07-30 MED ORDER — HYDROCODONE-ACETAMINOPHEN 5-325 MG PO TABS: ORAL_TABLET | ORAL | 0 refills | Status: DC

## 2023-07-30 NOTE — Addendum Note (Signed)
Addended by: Abbe Amsterdam C on: 07/30/2023 07:25 PM   Modules accepted: Orders

## 2023-07-30 NOTE — Telephone Encounter (Signed)
Received VM from pt to go over RBC results from yesterday.  RN attempt x1 to return call.  No answer, LVM for pt to return call to the office.

## 2023-08-10 NOTE — Progress Notes (Unsigned)
[facility] at [facility] [address], [address] [place], [place] [address] [phone]  Date:  08/14/2023   Name:  Dawn Powers   DOB:  12-21-1945   MRN:  [id number]  PCP:  Pearline Cables, MD    Chief Complaint: med follow up, labs (Concerns/ questions: 1. Medications. 2. Face injury 3. Arm stiffness. 4. Vertigo 5. Leg cramps. /- labs: kidney function, glucose. )   History of Present Illness:  Dawn Powers is a 77 y.o. very pleasant female patient who presents with the following:  Patient seen today for follow-up Most recent visit with myself was in April -history of pre-diabetes, HTN, endometrial cancer s/p hyst 2016, chronic back pain, breast cancer dx 12/21, osteoporosis  She uses hydrocodone occasionally for back and knee pain-need to update UDS today  She started Prolia last year for osteoporosis Most recent visit with her oncologist, Dr. Pamelia Hoit in April to discuss breast cancer.  She is currently using anastrozole: ASSESSMENT & PLAN:  Malignant neoplasm of upper-outer quadrant of left breast in female, 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/19/2020: Right breast biopsy: T1CN0 stage Ia grade 3 IDC ER/PR positive, HER2 equal vocal, FISH negative, Ki-67 10% (genetics negative) 10/19/2020: Left lumpectomy: Grade 2 IDC negative margins (Oncotype DX: Score 15: 4% ROR) 12/20/2020-01/10/2021: Adjuvant radiation  Current treatment: Anastrozole started neoadjuvantly 09/21/2020 Anastrozole toxicities: Severely dry skin Joint stiffness and achiness 3.  Hair thinning Breast cancer surveillance: 1.  Breast exam 07/30/2022: Benign 2. mammogram and ultrasound 11/06/2022: No mammographic evidence or sonographic evidence of malignancy at the site of palpable concern in the right breast. 3.  Bone density: 08/13/2022: T score -3.9 (was -3.6): Profound osteoporosis On Prolia every 6 months Muscle spasm at night. Return to clinic every 6 months for  Prolia injections with labs I will see her in 1 year.  Shingles vaccine complete, flu shot up-to-date.  Suggested RSV  She notes she does not need more trazodone right now- she uses it just some of the time  Will refill BP meds She occasionally uses hydrocodone for back pain-  She injured her face in April - she laid on the floor due to back spasm and accidentally pulled a heavy lamp down, it hit her in the right side of her face.   She has healed well, she did see her eye doctor who felt she was ok   She has noted more issues with her left shoulder being stiff since she had her breast surgery-offered physical therapy, she states she can do physical therapy at home, she will get back with this  Wt Readings from Last 3 Encounters:  08/14/23 120 lb 12.8 oz (54.8 kg)  04/22/23 120 lb (54.4 kg)  01/20/23 123 lb 11.2 oz (56.1 kg)   She notes her weight has dropped a few lbs- admits she is eating a bit less  She stopped drinking wine which may also have contributed  She will sometimes get some leg cramps that wake her up at night - she will have to get up and walk around to clear it up She is getting some positional vertigo on occasion-not enough to bother her, she does want to mention it   Patient Active Problem List   Diagnosis Date Noted   Osteoporosis 06/12/2021   Genetic testing 12/18/2020   Family history of prostate cancer    Family history of breast cancer    Family history of kidney cancer  Malignant neoplasm of upper-outer quadrant of left breast in female, 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/21/2020   Prediabetes 07/02/2019   S/P shoulder replacement, 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   Endometrial cancer (HCC) 09/26/2015   Essential hypertension 05/23/2015   Thoracic facet joint syndrome 04/27/2014   Back pain 03/24/2014   Muscle spasm 03/24/2014   Idiopathic scoliosis and kyphoscoliosis 12/22/2012   Osteopenia 12/22/2012   Other and unspecified hyperlipidemia 12/22/2012    Past  Medical History:  Diagnosis Date   Anxiety    Arthritis    Breast cancer (HCC) 09/19/2020   left breast [facility] w/ radiation no chemo   Cancer (HCC)    Endometrial    Family history of breast cancer    Family history of kidney cancer    Family history of prostate cancer    Heart murmur    Hyperlipidemia    Hypertension    Neuromuscular disorder (HCC)    DDD   Osteoporosis 06/12/2021   Pain    lower back -hx "DDD"   Personal history of radiation therapy    left [facility] 08/2020 w/ radiation no chemo   Radiation 11/15/15, 11/23/15, 11/30/15, 12/07/15, 12/14/15   HDR brachytherapy    Past Surgical History:  Procedure Laterality Date   ABDOMINAL HYSTERECTOMY     BREAST LUMPECTOMY Left 10/19/2018   left lumpectomy 10-19-20   BREAST LUMPECTOMY WITH RADIOACTIVE SEED AND SENTINEL LYMPH NODE BIOPSY Left 10/19/2020   Procedure: LEFT BREAST LUMPECTOMY WITH RADIOACTIVE SEED AND SENTINEL LYMPH NODE BIOPSY;  Surgeon: Almond Lint, MD;  Location: [facility];  Service: General;  Laterality: Left;  PEC BLOCK, RNFA   CATARACT EXTRACTION Bilateral    COLONOSCOPY     FRACTURE SURGERY Right    ankle   GANGLION CYST EXCISION     LYMPH NODE BIOPSY N/A 09/26/2015   Procedure: SENTINEL LYMPH NODE BIOPSY;  Surgeon: Adolphus Birchwood, MD;  Location: [facility];  Service: Gynecology;  Laterality: N/A;   ROBOTIC ASSISTED TOTAL HYSTERECTOMY WITH BILATERAL SALPINGO OOPHERECTOMY Bilateral 09/26/2015   Procedure: ROBOTIC ASSISTED TOTAL HYSTERECTOMY WITH BILATERAL SALPINGO OOPHORECTOMY;  Surgeon: Adolphus Birchwood, MD;  Location: [facility];  Service: Gynecology;  Laterality: Bilateral;   TOTAL SHOULDER ARTHROPLASTY Left 09/10/2018   Procedure: TOTAL SHOULDER ARTHROPLASTY;  Surgeon: Francena Hanly, MD;  Location: [facility];  Service: Orthopedics;  Laterality: Left;    WISDOM TOOTH EXTRACTION     WISDOM TOOTH EXTRACTION      Social History   Tobacco Use   Smoking status: Former    Current packs/day: 0.00    Types:  Cigarettes    Start date: 09/30/1994    Quit date: 09/30/1998    Years since quitting: 24.8   Smokeless tobacco: Never  Vaping Use   Vaping status: Never Used  Substance Use Topics   Alcohol use: Yes    Alcohol/week: 3.0 standard drinks of alcohol    Types: 3 Standard drinks or equivalent per week    Comment: 1 drink 3 times per week   Drug use: No    Family History  Problem Relation Age of Onset   Heart disease Mother    Alcohol abuse Mother        breast cancer - questionable    Dementia Mother    Alcohol abuse Father    Prostate cancer Father 19       prostate and colon- unsure of primary    Kidney cancer Brother 66   Alcohol abuse Maternal Grandfather    Alcohol  abuse Sister    Basal cell carcinoma Sister    Breast cancer Cousin        double mastectomy, d. 68   Prostate cancer Brother 4   Esophageal cancer Neg Hx    Stomach cancer Neg Hx    Rectal cancer Neg Hx     Allergies  Allergen Reactions   Morphine Itching    Medication list has been reviewed and updated.  Current Outpatient Medications on File Prior to Visit  Medication Sig Dispense Refill   anastrozole (ARIMIDEX) 1 MG tablet Take 1 tablet (1 mg total) by mouth daily. 90 tablet 4   cholecalciferol (VITAMIN D3) 25 MCG (1000 UNIT) tablet Take 2 tablets (2,000 Units total) by mouth daily.     Cranberry 250 MG CAPS Take 1 capsule by mouth daily.     Cyanocobalamin (VITAMIN B 12) 500 MCG TABS Take by mouth.     denosumab (PROLIA) 60 MG/ML SOSY injection Inject 60 mg into the skin every 6 (six) months. 1 mL 0   HYDROcodone-acetaminophen (NORCO/VICODIN) 5-325 MG tablet TAKE 1 OR 2 TABLETS EVERY 6 HOURS AS NEEDED FOR PAIN. 90 tablet 0   Probiotic Product (PROBIOTIC DAILY PO) Take 1 tablet by mouth daily.     traZODone (DESYREL) 50 MG tablet TAKE 1 AND 1/2 TABLETS BY MOUTH AT BEDTIME AS NEEDED FOR SLEEP 135 tablet 1   No current facility-administered medications on file prior to visit.    Review of  Systems:  As per HPI- otherwise negative.   Physical Examination: Vitals:   08/14/23 0940  BP: (!) 140/60  Pulse: 67  Resp: 18  Temp: 97.9 F (36.6 C)  SpO2: 99%   Vitals:   08/14/23 0940  Weight: 120 lb 12.8 oz (54.8 kg)  Height: 5\' 3"  (1.6 m)   Body mass index is 21.4 kg/m. Ideal Body Weight: Weight in (lb) to have BMI = 25: 140.8  GEN: no acute distress.  Slender build, looks well HEENT: Atraumatic, Normocephalic.  Bilateral TM wnl, oropharynx normal.  PEERL,EOMI. nasal cavity normal Ears and Nose: No external deformity. CV: RRR, No M/G/R. No JVD. No thrill. No extra heart sounds. PULM: CTA B, no wheezes, crackles, rhonchi. No retractions. No resp. distress. No accessory muscle use. ABD: S, NT, ND, +BS. No rebound. No HSM. EXTR: No c/c/e PSYCH: Normally interactive. Conversant.   She shows me a photo of her facial injury from earlier this year.  Fortunately this seems to have healed totally Assessment and Plan: Screening for diabetes mellitus - Plan: Hemoglobin A1c  Essential hypertension - Plan: amLODipine (NORVASC) 5 MG tablet, losartan (COZAAR) 100 MG tablet  Screening, lipid - Plan: Lipid panel  Low magnesium level - Plan: Magnesium  Medication management - Plan: DRUG MONITORING, PANEL 8 WITH CONFIRMATION, URINE  Weight loss - Plan: TSH  Patient seen today for follow-up.  Routine lab work pending as above She notes like cramps at night, check magnesium level.  Discussed some home remedies Blood pressure is mildly elevated, patient notes it is typically around 135/65 or so when she checks it at home.  She will continue to monitor Encouraged her to work on gaining a bit of weight, but she has not lost a concerning amount Plan to recheck in about 6 months assuming all is well  Signed Abbe Amsterdam, MD  Received labs as below, message to patient  Results for orders placed or performed in visit on 08/14/23  Hemoglobin A1c  Result Value Ref Range  Hgb  A1c MFr Bld 5.9 4.6 - 6.5 %  Magnesium  Result Value Ref Range   Magnesium 2.3 1.5 - 2.5 mg/dL  TSH  Result Value Ref Range   TSH 3.49 0.35 - 5.50 uIU/mL  Lipid panel  Result Value Ref Range   Cholesterol 251 (H) 0 - 200 mg/dL   Triglycerides 16.1 0.0 - 149.0 mg/dL   HDL 09.60 >45.40 mg/dL   VLDL 98.1 0.0 - 19.1 mg/dL   LDL Cholesterol 478 (H) 0 - 99 mg/dL   Total CHOL/HDL Ratio 3    NonHDL 167.66

## 2023-08-14 ENCOUNTER — Ambulatory Visit (INDEPENDENT_AMBULATORY_CARE_PROVIDER_SITE_OTHER): Payer: Medicare Other | Admitting: Family Medicine

## 2023-08-14 ENCOUNTER — Encounter: Payer: Self-pay | Admitting: Family Medicine

## 2023-08-14 VITALS — BP 140/60 | HR 67 | Temp 97.9°F | Resp 18 | Ht 63.0 in | Wt 120.8 lb

## 2023-08-14 DIAGNOSIS — R634 Abnormal weight loss: Secondary | ICD-10-CM | POA: Diagnosis not present

## 2023-08-14 DIAGNOSIS — Z131 Encounter for screening for diabetes mellitus: Secondary | ICD-10-CM

## 2023-08-14 DIAGNOSIS — I10 Essential (primary) hypertension: Secondary | ICD-10-CM

## 2023-08-14 DIAGNOSIS — Z79899 Other long term (current) drug therapy: Secondary | ICD-10-CM | POA: Diagnosis not present

## 2023-08-14 DIAGNOSIS — R7303 Prediabetes: Secondary | ICD-10-CM

## 2023-08-14 DIAGNOSIS — Z13220 Encounter for screening for lipoid disorders: Secondary | ICD-10-CM | POA: Diagnosis not present

## 2023-08-14 DIAGNOSIS — R790 Abnormal level of blood mineral: Secondary | ICD-10-CM

## 2023-08-14 LAB — HEMOGLOBIN A1C: Hgb A1c MFr Bld: 5.9 % (ref 4.6–6.5)

## 2023-08-14 LAB — LIPID PANEL
Cholesterol: 251 mg/dL — ABNORMAL HIGH (ref 0–200)
HDL: 83.7 mg/dL (ref >39.00)
LDL Cholesterol: 152 mg/dL — ABNORMAL HIGH (ref 0–99)
NonHDL: 167.66
Total CHOL/HDL Ratio: 3
Triglycerides: 80 mg/dL (ref 0.0–149.0)
VLDL: 16 mg/dL (ref 0.0–40.0)

## 2023-08-14 LAB — MAGNESIUM: Magnesium: 2.3 mg/dL (ref 1.5–2.5)

## 2023-08-14 LAB — TSH: TSH: 3.49 u[IU]/mL (ref 0.35–5.50)

## 2023-08-14 MED ORDER — AMLODIPINE BESYLATE 5 MG PO TABS: 5.0000 mg | ORAL_TABLET | Freq: Every day | ORAL | 3 refills | Status: DC

## 2023-08-14 MED ORDER — LOSARTAN POTASSIUM 100 MG PO TABS: 100.0000 mg | ORAL_TABLET | Freq: Every day | ORAL | 3 refills | Status: DC

## 2023-08-14 NOTE — Patient Instructions (Addendum)
It was good to see you again today, I will be in touch your labs as soon as possible.  Try to work on your physical therapy exercises for your left arm.  Let me know if you are vertigo is getting worse.  We will look for an explanation for your leg cramps.  Some home remedies you might try include tonic water, yellow mustard, electrolytes like Gatorade  Please continue to keep an eye on your blood pressure, let me know if it starts running too high at home  I do recommend getting a dose of RSV vaccine  I would like to see you gain a few pounds, perhaps try adding a serving of ice cream daily

## 2023-08-15 LAB — DRUG MONITORING, PANEL 8 WITH CONFIRMATION, URINE
6 Acetylmorphine: NEGATIVE ng/mL (ref <10)
Alcohol Metabolites: NEGATIVE ng/mL (ref <500)
Amphetamines: NEGATIVE ng/mL (ref <500)
Benzodiazepines: NEGATIVE ng/mL (ref <100)
Buprenorphine, Urine: NEGATIVE ng/mL (ref <5)
Cocaine Metabolite: NEGATIVE ng/mL (ref <150)
Creatinine: 43.9 mg/dL (ref >=20.0)
MDMA: NEGATIVE ng/mL (ref <500)
Marijuana Metabolite: NEGATIVE ng/mL (ref <20)
Opiates: NEGATIVE ng/mL (ref <100)
Oxidant: NEGATIVE ug/mL (ref <200)
Oxycodone: NEGATIVE ng/mL (ref <100)
pH: 7.2 (ref 4.5–9.0)

## 2023-08-15 LAB — DM TEMPLATE

## 2023-10-06 ENCOUNTER — Other Ambulatory Visit: Payer: Self-pay | Admitting: Hematology and Oncology

## 2023-10-06 ENCOUNTER — Encounter: Payer: Self-pay | Admitting: Family Medicine

## 2023-10-06 DIAGNOSIS — Z853 Personal history of malignant neoplasm of breast: Secondary | ICD-10-CM

## 2023-10-07 ENCOUNTER — Telehealth: Payer: Self-pay

## 2023-10-07 NOTE — Telephone Encounter (Signed)
 Pt received a bill for her A1C and hasn't in the past. The only difference I noticed was that this one was associated to screening for DM and the past to prediabetes I let her know that I would let you know so that I could see if changing this would make a difference.

## 2023-10-07 NOTE — Telephone Encounter (Signed)
 Copied from CRM [id number]. Topic: Clinical - Lab/Test Results >> Oct 07, 2023  9:28 AM Burnard DEL wrote: Reason for CRM: patient stated that she never heard back from labs that were drawn on 08/13/2024. She was tested to see if she was prediabetic and would like to know what the results were. Additionally ,she would like Dr Watt to know that she received her RSV vaccine at [facility] and they will be faxing over that documentation.

## 2023-10-08 NOTE — Telephone Encounter (Signed)
Please advise on how to move forward.

## 2023-11-12 ENCOUNTER — Ambulatory Visit
Admission: RE | Admit: 2023-11-12 | Discharge: 2023-11-12 | Disposition: A | Discharge status: Home / Self Care | Payer: Medicare Other | Source: Ambulatory Visit | Attending: Hematology and Oncology | Admitting: Hematology and Oncology

## 2023-11-12 DIAGNOSIS — Z1231 Encounter for screening mammogram for malignant neoplasm of breast: Secondary | ICD-10-CM | POA: Diagnosis not present

## 2023-11-12 DIAGNOSIS — Z853 Personal history of malignant neoplasm of breast: Secondary | ICD-10-CM

## 2023-12-15 ENCOUNTER — Ambulatory Visit: Payer: Self-pay | Admitting: Family Medicine

## 2023-12-15 NOTE — Patient Instructions (Incomplete)
 Good to see you today Recommend dose of RSV vaccine if not done already  I would suggest voltaren gel for your finger- if this gets to the point where you want to consider an injection I can set you up for this You can also try tylenol and/ or aleve as needed Would suggest Tums as needed for indigestion - probably not activated charcoal   If no covid shot in more than 9 months or so can do a booster

## 2023-12-15 NOTE — Progress Notes (Unsigned)
 [facility] at [facility] [address] [place], [place] [address] [phone]  Date:  12/17/2023   Name:  Dawn Powers   DOB:  [birth year]   MRN:  [id number]  PCP:  Pearline Cables, MD    Chief Complaint: Peripheral Neuropathy (Fingers and toes. Also having issues with R ring- trigger finger/Concerns/ questions: activated charcoal. 2. Sleep issues d/t hip and shoulder pain)   History of Present Illness:  Dawn Powers is a 78 y.o. very pleasant female patient who presents with the following:  Pt seen today with a few concerns Last seen by myself in November -history of pre-diabetes, HTN, endometrial cancer s/p hyst 2016, chronic back pain, breast cancer dx 12/21, osteoporosis  She uses hydrocodone occasionally for back and knee pain Prolia for osteoporosis   Mammo is UTD  Pt notes her right ring finger seems to have developed a trigger finger - she first noted this a few months ago.  Seems to be getting a bit worse. 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  I suggested that she instead try something like Tums antacids  Patient Active Problem List   Diagnosis Date Noted   Osteoporosis 06/12/2021   Genetic testing 12/18/2020   Family history of prostate cancer    Family history of breast cancer    Family history of kidney cancer    Malignant neoplasm of upper-outer quadrant of left breast in female, 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/21/2020    Prediabetes 07/02/2019   S/P shoulder replacement, 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   Endometrial cancer (HCC) 09/26/2015   Essential hypertension 05/23/2015   Thoracic facet joint syndrome 04/27/2014   Back pain 03/24/2014   Muscle spasm 03/24/2014   Idiopathic scoliosis and kyphoscoliosis 12/22/2012   Osteopenia 12/22/2012   Other and unspecified hyperlipidemia 12/22/2012    Past Medical History:  Diagnosis Date   Anxiety    Arthritis    Breast cancer (HCC) 09/19/2020   left breast [facility] w/ radiation no chemo   Cancer (HCC)    Endometrial    Family history of breast cancer    Family history of kidney cancer    Family history of prostate cancer    Heart murmur    Hyperlipidemia    Hypertension    Neuromuscular disorder (HCC)    DDD   Osteoporosis 06/12/2021   Pain    lower back -hx "DDD"   Personal history of radiation therapy    left [facility] 08/2020 w/ radiation no chemo   Radiation 11/15/15, 11/23/15, 11/30/15, 12/07/15, 12/14/15   HDR brachytherapy    Past Surgical History:  Procedure Laterality Date   ABDOMINAL HYSTERECTOMY     BREAST LUMPECTOMY Left 10/19/2020   left lumpectomy 10-19-20   BREAST LUMPECTOMY WITH RADIOACTIVE SEED AND SENTINEL LYMPH NODE BIOPSY Left 10/19/2020   Procedure: LEFT BREAST LUMPECTOMY WITH RADIOACTIVE SEED AND SENTINEL LYMPH NODE BIOPSY;  Surgeon:  Almond Lint, MD;  Location: [facility];  Service: General;  Laterality: Left;  PEC BLOCK, RNFA   CATARACT EXTRACTION Bilateral    COLONOSCOPY     FRACTURE SURGERY Right    ankle   GANGLION CYST EXCISION     LYMPH NODE BIOPSY N/A 09/26/2015   Procedure: SENTINEL LYMPH NODE BIOPSY;  Surgeon: Adolphus Birchwood, MD;  Location: [facility];  Service: Gynecology;  Laterality: N/A;   ROBOTIC ASSISTED TOTAL HYSTERECTOMY WITH BILATERAL SALPINGO OOPHERECTOMY Bilateral 09/26/2015   Procedure: ROBOTIC ASSISTED TOTAL HYSTERECTOMY WITH BILATERAL SALPINGO OOPHORECTOMY;  Surgeon: Adolphus Birchwood, MD;  Location: [facility];  Service:  Gynecology;  Laterality: Bilateral;   TOTAL SHOULDER ARTHROPLASTY Left 09/10/2018   Procedure: TOTAL SHOULDER ARTHROPLASTY;  Surgeon: Francena Hanly, MD;  Location: [facility];  Service: Orthopedics;  Laterality: Left;    WISDOM TOOTH EXTRACTION     WISDOM TOOTH EXTRACTION      Social History   Tobacco Use   Smoking status: Former    Current packs/day: 0.00    Types: Cigarettes    Start date: 09/30/1994    Quit date: 09/30/1998    Years since quitting: 25.2   Smokeless tobacco: Never  Vaping Use   Vaping status: Never Used  Substance Use Topics   Alcohol use: Yes    Alcohol/week: 3.0 standard drinks of alcohol    Types: 3 Standard drinks or equivalent per week    Comment: 1 drink 3 times per week   Drug use: No    Family History  Problem Relation Age of Onset   Heart disease Mother    Alcohol abuse Mother        breast cancer - questionable    Dementia Mother    Alcohol abuse Father    Prostate cancer Father 28       prostate and colon- unsure of primary    Kidney cancer Brother 6   Alcohol abuse Maternal Grandfather    Alcohol abuse Sister    Basal cell carcinoma Sister    Breast cancer Cousin        double mastectomy, d. 25   Prostate cancer Brother 40   Esophageal cancer Neg Hx    Stomach cancer Neg Hx    Rectal cancer Neg Hx     Allergies  Allergen Reactions   Morphine Itching    Medication list has been reviewed and updated.  Current Outpatient Medications on File Prior to Visit  Medication Sig Dispense Refill   amLODipine (NORVASC) 5 MG tablet Take 1 tablet (5 mg total) by mouth daily. 90 tablet 3   anastrozole (ARIMIDEX) 1 MG tablet Take 1 tablet (1 mg total) by mouth daily. 90 tablet 4   cholecalciferol (VITAMIN D3) 25 MCG (1000 UNIT) tablet Take 2 tablets (2,000 Units total) by mouth daily.     Cranberry 250 MG CAPS Take 1 capsule by mouth daily.     Cyanocobalamin (VITAMIN B 12) 500 MCG TABS Take by mouth.     denosumab (PROLIA) 60 MG/ML SOSY  injection Inject 60 mg into the skin every 6 (six) months. 1 mL 0   HYDROcodone-acetaminophen (NORCO/VICODIN) 5-325 MG tablet TAKE 1 OR 2 TABLETS EVERY 6 HOURS AS NEEDED FOR PAIN. 90 tablet 0   losartan (COZAAR) 100 MG tablet Take 1 tablet (100 mg total) by mouth daily. 90 tablet 3   traZODone (DESYREL) 50 MG tablet TAKE 1 AND 1/2 TABLETS BY MOUTH AT BEDTIME AS NEEDED FOR SLEEP 135 tablet  1   No current facility-administered medications on file prior to visit.    Review of Systems:  As per HPI- otherwise negative.   Physical Examination: Vitals:   12/17/23 1414  BP: 122/72  Pulse: 60  Resp: 18  Temp: 97.9 F (36.6 C)  SpO2: 97%   Vitals:   12/17/23 1414  Weight: 122 lb 3.2 oz (55.4 kg)   Body mass index is 21.65 kg/m. Ideal Body Weight:    GEN: no acute distress.  Normal weight, looks well  HEENT: Atraumatic, Normocephalic.  Ears and Nose: No external deformity. CV: RRR, No M/G/R. No JVD. No thrill. No extra heart sounds. PULM: CTA B, no wheezes, crackles, rhonchi. No retractions. No resp. distress. No accessory muscle use. EXTR: No c/c/e PSYCH: Normally interactive. Conversant.  Right hand: I am able to inconsistently reproduce trigger finger of the right ring finger Osteoarthritis changes are evident at the first MCP joints bilaterally.  Strength of both hands is normal, muscular development normal.  No redness or sign of infection  Assessment and Plan: Trigger finger, acquired  Chronic pain syndrome - Plan: HYDROcodone-acetaminophen (NORCO/VICODIN) 5-325 MG tablet  Immunization due  Indigestion  Patient seen today with a few concerns.  She has a likely trigger finger of the right ring finger.  I offered to have her see orthopedics for injection treatment.  At this point her symptoms are not that severe, she prefers to live with it for now Can try Voltaren  Refilled hydrocodone which she uses on occasion for knee and back pain. She most recently filled 90 pills  about 5 months ago and urine drug screen is up-to-date  We discussed her immunizations, I recommended RSV and COVID booster if due Recommend an acid such as Tums for occasional indigestion symptoms Signed Abbe Amsterdam, MD

## 2023-12-15 NOTE — Telephone Encounter (Signed)
  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 She also has a trigger finger starting on right hand pointer, it seems to lock up at night. She has tingling in her feet that has been ongoing for a long time and has been evaluated and follow for this. Due for follow up in May. Acute visit scheduled with PCP on 12/17/23. Care advice provided as documented. Discussed reasons to call back.    Copied from CRM [id number]. Topic: Clinical - Red Word Triage >> Dec 15, 2023  5:15 PM Martha Clan wrote: Red Word that prompted transfer to Nurse Triage: Numbness and tingling in fingers. Reason for Disposition  [1] Numbness or tingling AND [2] is a chronic symptom (recurrent or ongoing AND present > 4 weeks)  Protocols used: Finger Pain-A-AH

## 2023-12-16 NOTE — Telephone Encounter (Signed)
 Pt is scheduled with PCP 12/17/2023.

## 2023-12-17 ENCOUNTER — Ambulatory Visit (INDEPENDENT_AMBULATORY_CARE_PROVIDER_SITE_OTHER): Admitting: Family Medicine

## 2023-12-17 VITALS — BP 122/72 | HR 60 | Temp 97.9°F | Resp 18 | Wt 122.2 lb

## 2023-12-17 DIAGNOSIS — G894 Chronic pain syndrome: Secondary | ICD-10-CM

## 2023-12-17 DIAGNOSIS — K30 Functional dyspepsia: Secondary | ICD-10-CM | POA: Diagnosis not present

## 2023-12-17 DIAGNOSIS — Z23 Encounter for immunization: Secondary | ICD-10-CM | POA: Diagnosis not present

## 2023-12-17 DIAGNOSIS — M6530 Trigger finger, unspecified finger: Secondary | ICD-10-CM

## 2023-12-17 MED ORDER — HYDROCODONE-ACETAMINOPHEN 5-325 MG PO TABS: ORAL_TABLET | ORAL | 0 refills | Status: DC

## 2023-12-19 ENCOUNTER — Telehealth: Payer: Self-pay | Admitting: Family Medicine

## 2023-12-19 DIAGNOSIS — M6530 Trigger finger, unspecified finger: Secondary | ICD-10-CM

## 2023-12-19 NOTE — Telephone Encounter (Signed)
 Copied from CRM [id number]. Topic: Referral - Request for Referral >> Dec 19, 2023  8:41 AM Marica Otter wrote: Did the patient discuss referral with their provider in the last year? Yes, was supposed to let provider know when she was ready for referral (If No - schedule appointment) (If Yes - send message)  Appointment offered? No  Type of order/referral and detailed reason for visit: Hand specialist for trigger finger  Preference of office, provider, location: N/A  If referral order, have you been seen by this specialty before? No (If Yes, this issue or another issue? When? Where?  Can we respond through MyChart? Yes

## 2023-12-22 ENCOUNTER — Encounter: Payer: Self-pay | Admitting: Family Medicine

## 2023-12-22 NOTE — Addendum Note (Signed)
 Addended by: Abbe Amsterdam C on: 12/22/2023 05:21 PM   Modules accepted: Orders

## 2024-01-01 ENCOUNTER — Ambulatory Visit (INDEPENDENT_AMBULATORY_CARE_PROVIDER_SITE_OTHER): Admitting: Orthopaedic Surgery

## 2024-01-01 DIAGNOSIS — M65341 Trigger finger, right ring finger: Secondary | ICD-10-CM | POA: Diagnosis not present

## 2024-01-01 NOTE — Progress Notes (Signed)
 Office Visit Note   Patient: Dawn Powers           Date of Birth: 1946-06-27           MRN: [id number] Visit Date: 01/01/2024              Requested by: Pearline Cables, MD [address] [place],  [place] [address] PCP: Pearline Cables, MD   Assessment & Plan: Visit Diagnoses:  1. Trigger finger, right ring finger     Plan: Patient is a 78 year old female with a right ring trigger finger.  Disease process explained and treatment options were reviewed.  She would like to hold off on the injection for now and just do a nighttime splint.  She will follow-up if symptoms do not improve.  Follow-Up Instructions: No follow-ups on file.   Orders:  No orders of the defined types were placed in this encounter.  No orders of the defined types were placed in this encounter.     Procedures: No procedures performed   Clinical Data: No additional findings.   Subjective: Chief Complaint  Patient presents with   Left Thumb - Pain   Right Thumb - Pain   Right Ring Finger - Pain    HPI Dawn Powers is a 78 year old female comes in for suspected right ring trigger finger.  She has had symptoms and locking for about 3 weeks.  Occasionally wakes up with a locked finger.  Feels clicking.  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  Denies any trauma.  Has been using naproxen and ibuprofen at times. Review of Systems  Constitutional: Negative.   HENT: Negative.    Eyes: Negative.   Respiratory: Negative.    Cardiovascular: Negative.   Endocrine: Negative.   Musculoskeletal: Negative.   Neurological: Negative.   Hematological: Negative.   Psychiatric/Behavioral: Negative.    All other systems reviewed and are negative.    Objective: Vital Signs: There were no vitals taken for this visit.  Physical Exam Vitals and nursing note reviewed.  Constitutional:      Appearance: She is well-developed.  HENT:     Head: Atraumatic.     Nose: Nose  normal.  Eyes:     Extraocular Movements: Extraocular movements intact.  Cardiovascular:     Pulses: Normal pulses.  Pulmonary:     Effort: Pulmonary effort is normal.  Abdominal:     Palpations: Abdomen is soft.  Musculoskeletal:     Cervical back: Neck supple.  Skin:    General: Skin is warm.     Capillary Refill: Capillary refill takes less than 2 seconds.  Neurological:     Mental Status: She is alert. Mental status is at baseline.  Psychiatric:        Behavior: Behavior normal.        Thought Content: Thought content normal.        Judgment: Judgment normal.     Ortho Exam Exam of the right ring finger shows tenderness around the A1 pulley.  No active locking.  No neurovascular compromise.  Full arc of motion. Specialty Comments:  No specialty comments available.  Imaging: No results found.   PMFS History: Patient Active Problem List   Diagnosis Date Noted   Osteoporosis 06/12/2021   Genetic testing 12/18/2020   Family history of prostate cancer    Family history of breast cancer    Family history of kidney cancer    Malignant neoplasm of upper-outer  quadrant of left breast in female, 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/21/2020   Prediabetes 07/02/2019   S/P shoulder replacement, 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   Endometrial cancer (HCC) 09/26/2015   Essential hypertension 05/23/2015   Thoracic facet joint syndrome 04/27/2014   Back pain 03/24/2014   Muscle spasm 03/24/2014   Idiopathic scoliosis and kyphoscoliosis 12/22/2012   Osteopenia 12/22/2012   Other and unspecified hyperlipidemia 12/22/2012   Past Medical History:  Diagnosis Date   Anxiety    Arthritis    Breast cancer (HCC) 09/19/2020   left breast [facility] w/ radiation no chemo   Cancer (HCC)    Endometrial    Family history of breast cancer    Family history of kidney cancer    Family history of prostate cancer    Heart murmur    Hyperlipidemia    Hypertension    Neuromuscular disorder (HCC)    DDD    Osteoporosis 06/12/2021   Pain    lower back -hx "DDD"   Personal history of radiation therapy    left [facility] 08/2020 w/ radiation no chemo   Radiation 11/15/15, 11/23/15, 11/30/15, 12/07/15, 12/14/15   HDR brachytherapy    Family History  Problem Relation Age of Onset   Heart disease Mother    Alcohol abuse Mother        breast cancer - questionable    Dementia Mother    Alcohol abuse Father    Prostate cancer Father 36       prostate and colon- unsure of primary    Kidney cancer Brother 14   Alcohol abuse Maternal Grandfather    Alcohol abuse Sister    Basal cell carcinoma Sister    Breast cancer Cousin        double mastectomy, d. 21   Prostate cancer Brother 40   Esophageal cancer Neg Hx    Stomach cancer Neg Hx    Rectal cancer Neg Hx     Past Surgical History:  Procedure Laterality Date   ABDOMINAL HYSTERECTOMY     BREAST LUMPECTOMY Left 10/19/2020   left lumpectomy 10-19-20   BREAST LUMPECTOMY WITH RADIOACTIVE SEED AND SENTINEL LYMPH NODE BIOPSY Left 10/19/2020   Procedure: LEFT BREAST LUMPECTOMY WITH RADIOACTIVE SEED AND SENTINEL LYMPH NODE BIOPSY;  Surgeon: Almond Lint, MD;  Location: [facility];  Service: General;  Laterality: Left;  PEC BLOCK, RNFA   CATARACT EXTRACTION Bilateral    COLONOSCOPY     FRACTURE SURGERY Right    ankle   GANGLION CYST EXCISION     LYMPH NODE BIOPSY N/A 09/26/2015   Procedure: SENTINEL LYMPH NODE BIOPSY;  Surgeon: Adolphus Birchwood, MD;  Location: [facility];  Service: Gynecology;  Laterality: N/A;   ROBOTIC ASSISTED TOTAL HYSTERECTOMY WITH BILATERAL SALPINGO OOPHERECTOMY Bilateral 09/26/2015   Procedure: ROBOTIC ASSISTED TOTAL HYSTERECTOMY WITH BILATERAL SALPINGO OOPHORECTOMY;  Surgeon: Adolphus Birchwood, MD;  Location: [facility];  Service: Gynecology;  Laterality: Bilateral;   TOTAL SHOULDER ARTHROPLASTY Left 09/10/2018   Procedure: TOTAL SHOULDER ARTHROPLASTY;  Surgeon: Francena Hanly, MD;  Location: [facility];  Service: Orthopedics;  Laterality: Left;     WISDOM TOOTH EXTRACTION     WISDOM TOOTH EXTRACTION     Social History   Occupational History   Occupation: retired Manufacturing engineer  Tobacco Use   Smoking status: Former    Current packs/day: 0.00    Types: Cigarettes    Start date: 09/30/1994    Quit date: 09/30/1998    Years since quitting: 25.2  Smokeless tobacco: Never  Vaping Use   Vaping status: Never Used  Substance and Sexual Activity   Alcohol use: Yes    Alcohol/week: 3.0 standard drinks of alcohol    Types: 3 Standard drinks or equivalent per week    Comment: 1 drink 3 times per week   Drug use: No   Sexual activity: Yes    Partners: Female    Birth control/protection: Surgical

## 2024-01-08 DIAGNOSIS — H43811 Vitreous degeneration, right eye: Secondary | ICD-10-CM | POA: Diagnosis not present

## 2024-01-16 DIAGNOSIS — M25562 Pain in left knee: Secondary | ICD-10-CM | POA: Diagnosis not present

## 2024-01-21 DIAGNOSIS — L905 Scar conditions and fibrosis of skin: Secondary | ICD-10-CM | POA: Diagnosis not present

## 2024-01-21 DIAGNOSIS — D225 Melanocytic nevi of trunk: Secondary | ICD-10-CM | POA: Diagnosis not present

## 2024-01-21 DIAGNOSIS — L814 Other melanin hyperpigmentation: Secondary | ICD-10-CM | POA: Diagnosis not present

## 2024-01-21 DIAGNOSIS — L821 Other seborrheic keratosis: Secondary | ICD-10-CM | POA: Diagnosis not present

## 2024-01-26 ENCOUNTER — Other Ambulatory Visit: Payer: Self-pay

## 2024-01-26 DIAGNOSIS — M810 Age-related osteoporosis without current pathological fracture: Secondary | ICD-10-CM

## 2024-01-26 DIAGNOSIS — C50412 Malignant neoplasm of upper-outer quadrant of left female breast: Secondary | ICD-10-CM

## 2024-01-27 ENCOUNTER — Inpatient Hospital Stay: Payer: Medicare Other

## 2024-01-27 ENCOUNTER — Inpatient Hospital Stay: Payer: Medicare Other | Attending: Hematology and Oncology | Admitting: Hematology and Oncology

## 2024-01-27 VITALS — BP 158/57 | HR 62 | Temp 98.0°F | Resp 17 | Ht 63.0 in | Wt 122.8 lb

## 2024-01-27 DIAGNOSIS — E538 Deficiency of other specified B group vitamins: Secondary | ICD-10-CM | POA: Insufficient documentation

## 2024-01-27 DIAGNOSIS — C50412 Malignant neoplasm of upper-outer quadrant of left female breast: Secondary | ICD-10-CM | POA: Insufficient documentation

## 2024-01-27 DIAGNOSIS — Z170 Estrogen receptor positive status [ER+]: Secondary | ICD-10-CM

## 2024-01-27 DIAGNOSIS — Z79811 Long term (current) use of aromatase inhibitors: Secondary | ICD-10-CM | POA: Insufficient documentation

## 2024-01-27 DIAGNOSIS — M810 Age-related osteoporosis without current pathological fracture: Secondary | ICD-10-CM

## 2024-01-27 DIAGNOSIS — R200 Anesthesia of skin: Secondary | ICD-10-CM | POA: Diagnosis not present

## 2024-01-27 DIAGNOSIS — Z79899 Other long term (current) drug therapy: Secondary | ICD-10-CM | POA: Insufficient documentation

## 2024-01-27 LAB — CMP (CANCER CENTER ONLY)
ALT: 19 U/L (ref 0–44)
AST: 22 U/L (ref 15–41)
Albumin: 4.4 g/dL (ref 3.5–5.0)
Alkaline Phosphatase: 37 U/L — ABNORMAL LOW (ref 38–126)
Anion gap: 7 (ref 5–15)
BUN: 21 mg/dL (ref 8–23)
CO2: 30 mmol/L (ref 22–32)
Calcium: 9.5 mg/dL (ref 8.9–10.3)
Chloride: 104 mmol/L (ref 98–111)
Creatinine: 1.04 mg/dL — ABNORMAL HIGH (ref 0.44–1.00)
GFR, Estimated: 55 mL/min — ABNORMAL LOW (ref >60)
Glucose, Bld: 86 mg/dL (ref 70–99)
Potassium: 4 mmol/L (ref 3.5–5.1)
Sodium: 141 mmol/L (ref 135–145)
Total Bilirubin: 0.5 mg/dL (ref 0.0–1.2)
Total Protein: 6.9 g/dL (ref 6.5–8.1)

## 2024-01-27 LAB — CBC WITH DIFFERENTIAL (CANCER CENTER ONLY)
Abs Immature Granulocytes: 0.01 10*3/uL (ref 0.00–0.07)
Basophils Absolute: 0.1 10*3/uL (ref 0.0–0.1)
Basophils Relative: 1 %
Eosinophils Absolute: 0.2 10*3/uL (ref 0.0–0.5)
Eosinophils Relative: 3 %
HCT: 38.4 % (ref 36.0–46.0)
Hemoglobin: 13.2 g/dL (ref 12.0–15.0)
Immature Granulocytes: 0 %
Lymphocytes Relative: 22 %
Lymphs Abs: 1 10*3/uL (ref 0.7–4.0)
MCH: 32.3 pg (ref 26.0–34.0)
MCHC: 34.4 g/dL (ref 30.0–36.0)
MCV: 93.9 fL (ref 80.0–100.0)
Monocytes Absolute: 0.5 10*3/uL (ref 0.1–1.0)
Monocytes Relative: 10 %
Neutro Abs: 2.8 10*3/uL (ref 1.7–7.7)
Neutrophils Relative %: 64 %
Platelet Count: 238 10*3/uL (ref 150–400)
RBC: 4.09 MIL/uL (ref 3.87–5.11)
RDW: 12.4 % (ref 11.5–15.5)
WBC Count: 4.5 10*3/uL (ref 4.0–10.5)
nRBC: 0 % (ref 0.0–0.2)

## 2024-01-27 MED ORDER — CYANOCOBALAMIN 1000 MCG/ML IJ SOLN
1000.0000 ug | Freq: Once | INTRAMUSCULAR | Status: AC
  Administered 2024-01-27: 1000 ug via INTRAMUSCULAR
  Filled 2024-01-27: qty 1

## 2024-01-27 MED ORDER — DENOSUMAB 60 MG/ML ~~LOC~~ SOSY
60.0000 mg | PREFILLED_SYRINGE | Freq: Once | SUBCUTANEOUS | Status: AC
  Administered 2024-01-27: 60 mg via SUBCUTANEOUS
  Filled 2024-01-27: qty 1

## 2024-01-27 NOTE — Progress Notes (Signed)
 Patient Care Team: [name], Skipper Dumas, MD as PCP - General (Family Medicine) Retta Caster, MD as Consulting Physician (Radiation Oncology) Cyd Dowse, MD as Consulting Physician (Obstetrics and Gynecology) Cameron Cea, MD as Consulting Physician (Hematology and Oncology) Trent Frizzle, MD as Consulting Physician (Urology)  DIAGNOSIS:  Encounter Diagnosis  Name Primary?   Malignant neoplasm of upper-outer quadrant of left breast in female, estrogen receptor positive (HCC) Yes    SUMMARY OF ONCOLOGIC HISTORY: Oncology History  Endometrial cancer (HCC)  09/26/2015 Initial Diagnosis   Endometrial cancer (HCC)   09/21/2020 Cancer Staging   Staging form: Corpus Uteri - Carcinoma, AJCC 7th Edition - Clinical: FIGO Stage IB (T1b, N0, M0) - Signed by Willy Harvest, MD on 09/21/2020   Malignant neoplasm of upper-outer quadrant of left breast in female, estrogen receptor positive (HCC)  09/21/2020 Initial Diagnosis   Malignant neoplasm of upper-outer quadrant of left breast in female, estrogen receptor positive (HCC)   09/21/2020 Cancer Staging   Staging form: Breast, AJCC 8th Edition - Clinical: Stage IA (cT1c, cN0, cM0, G3, ER+, PR+, HER2: Equivocal) - Signed by Willy Harvest, MD on 09/21/2020    Genetic Testing   Negative genetic testing. No pathogenic variants identified on the [facility] Multi-Cancer Panel +RNA. The report date is 12/17/2020.  The Multi-Cancer Panel + RNA offered by [facility] includes sequencing and/or deletion duplication testing of the following 84 genes: AIP, ALK, APC, ATM, AXIN2,BAP1,  BARD1, BLM, BMPR1A, BRCA1, BRCA2, BRIP1, CASR, CDC73, CDH1, CDK4, CDKN1B, CDKN1C, CDKN2A (p14ARF), CDKN2A (p16INK4a), CEBPA, CHEK2, CTNNA1, DICER1, DIS3L2, EGFR (c.2369C>T, p.Thr790Met variant only), EPCAM (Deletion/duplication testing only), FH, FLCN, GATA2, GPC3, GREM1 (Promoter region deletion/duplication testing only), HOXB13 (c.251G>A, p.Gly84Glu), HRAS, KIT,  MAX, MEN1, MET, MITF (c.952G>A, p.Glu318Lys variant only), MLH1, MSH2, MSH3, MSH6, MUTYH, NBN, NF1, NF2, NTHL1, PALB2, PDGFRA, PHOX2B, PMS2, POLD1, POLE, POT1, PRKAR1A, PTCH1, PTEN, RAD50, RAD51C, RAD51D, RB1, RECQL4, RET, RUNX1, SDHAF2, SDHA (sequence changes only), SDHB, SDHC, SDHD, SMAD4, SMARCA4, SMARCB1, SMARCE1, STK11, SUFU, TERC, TERT, TMEM127, TP53, TSC1, TSC2, VHL, WRN and WT1.     CHIEF COMPLIANT: Surveillance of breast cancer, complaining of neuropathy  HISTORY OF PRESENT ILLNESS:   History of Present Illness Dawn Powers is a 78 year old female who presents with numbness in her fingers and feet.  She experiences numbness in her fingers and feet, with the latter being a longer-standing issue. This numbness is concerning due to its potential impact on her ability to play music. She experiences fatigue and leg cramps at night, for which she has taken magnesium. She has cold intolerance, particularly in her hands, affecting her ability to play music in air-conditioned environments.  She has not been taking her B12 supplements regularly but has recently purchased a 2500 mcg sublingual B12 supplement to take daily. Her son requires B12 shots due to intestinal issues, making her familiar with B12 supplementation.     ALLERGIES:  is allergic to morphine.  MEDICATIONS:  Current Outpatient Medications  Medication Sig Dispense Refill   amLODipine  (NORVASC ) 5 MG tablet Take 1 tablet (5 mg total) by mouth daily. 90 tablet 3   anastrozole  (ARIMIDEX ) 1 MG tablet Take 1 tablet (1 mg total) by mouth daily. 90 tablet 4   cholecalciferol (VITAMIN D3) 25 MCG (1000 UNIT) tablet Take 2 tablets (2,000 Units total) by mouth daily.     Cranberry 250 MG CAPS Take 1 capsule by mouth daily.     Cyanocobalamin  (VITAMIN B 12) 500 MCG TABS Take by mouth.  denosumab  (PROLIA ) 60 MG/ML SOSY injection Inject 60 mg into the skin every 6 (six) months. 1 mL 0   losartan  (COZAAR ) 100 MG tablet Take 1 tablet  (100 mg total) by mouth daily. 90 tablet 3   traZODone  (DESYREL ) 50 MG tablet TAKE 1 AND 1/2 TABLETS BY MOUTH AT BEDTIME AS NEEDED FOR SLEEP 135 tablet 1   HYDROcodone -acetaminophen  (NORCO/VICODIN) 5-325 MG tablet TAKE 1 OR 2 TABLETS EVERY 6 HOURS AS NEEDED FOR PAIN. (Patient not taking: Reported on 01/27/2024) 90 tablet 0   No current facility-administered medications for this visit.    PHYSICAL EXAMINATION: ECOG PERFORMANCE STATUS: 1 - Symptomatic but completely ambulatory  Vitals:   01/27/24 0920  BP: (!) 158/57  Pulse: 62  Resp: 17  Temp: 98 F (36.7 C)  SpO2: 100%   Filed Weights   01/27/24 0920  Weight: 122 lb 12.8 oz (55.7 kg)    Physical Exam BREAST: Breasts with small scar tissue.  (exam performed in the presence of a chaperone)  LABORATORY DATA:  I have reviewed the data as listed    Latest Ref Rng & Units 01/27/2024    8:55 AM 07/29/2023    1:25 PM 01/15/2023   10:07 AM  CMP  Glucose 70 - 99 mg/dL 86  409  87   BUN 8 - 23 mg/dL 21  20  21    Creatinine 0.44 - 1.00 mg/dL 8.11  9.14  7.82   Sodium 135 - 145 mmol/L 141  139  139   Potassium 3.5 - 5.1 mmol/L 4.0  3.8  4.8   Chloride 98 - 111 mmol/L 104  103  101   CO2 22 - 32 mmol/L 30  31  27    Calcium 8.9 - 10.3 mg/dL 9.5  9.9  9.8   Total Protein 6.5 - 8.1 g/dL 6.9  7.0  6.7   Total Bilirubin 0.0 - 1.2 mg/dL 0.5  0.6  0.6   Alkaline Phos 38 - 126 U/L 37  37  34   AST 15 - 41 U/L 22  18  20    ALT 0 - 44 U/L 19  11  13      Lab Results  Component Value Date   WBC 4.5 01/27/2024   HGB 13.2 01/27/2024   HCT 38.4 01/27/2024   MCV 93.9 01/27/2024   PLT 238 01/27/2024   NEUTROABS 2.8 01/27/2024    ASSESSMENT & PLAN:  Malignant neoplasm of upper-outer quadrant of left breast in female, 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/19/2020: Right breast biopsy: T1CN0 stage Ia grade 3 IDC ER/PR positive, HER2 equal vocal, FISH negative, Ki-67 10% (genetics negative) 10/19/2020: Left lumpectomy: Grade 2 IDC negative  margins (Oncotype DX: Score 15: 4% ROR) 12/20/2020-01/10/2021: Adjuvant radiation   Current treatment: Anastrozole  started neoadjuvantly 09/21/2020 Anastrozole  toxicities: Severely dry skin Joint stiffness and achiness 3.  Hair thinning   Breast cancer surveillance: 1.  Breast exam 01/27/2024: Benign 2. mammogram 11/14/2023: Benign breast density category B 3.  Bone density: 08/13/2022: T score -3.9 (was -3.6): Profound osteoporosis On Prolia  every 6 months Muscle spasm at night.   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  I instructed her to receive a B12 shot today and then start taking B12 supplements 2500 mcg daily.  We discussed different options including B12 injections weekly but we decided to try the 1 injection and then follow-up.  She placed a recorder in multiple bands and she is worried that  because of neuropathy she may not be able to play.  Return to clinic every 6 months for Prolia  injections with labs I will see her in 1 year. ------------------------------------- Assessment and Plan Assessment & Plan Vitamin B12 deficiency Numbness in fingers and feet likely due to Vitamin B12 deficiency. Current dose of 500 mcg is insufficient. Recommended 2500 mcg sublingually daily. Discussed B12 injections for faster improvement. She prefers one injection followed by oral supplementation. Explained injection may improve energy initially, nerve symptoms may take two to three shots. Excess B12 excreted, minimizing overdose risk. - Administer one Vitamin B12 injection today if insurance allows. - Instruct to take 2500 mcg Vitamin B12 sublingually daily after injection. - Monitor symptoms and consider additional injections if oral supplementation is insufficient.  Osteoporosis Received Prolia  injection today. No new issues reported. - Continue Prolia  injections as scheduled.      No orders of the defined types were placed in this  encounter.  The patient has a good understanding of the overall plan. she agrees with it. she will call with any problems that may develop before the next visit here. Total time spent: 30 mins including face to face time and time spent for planning, charting and co-ordination of care   Margert Sheerer, MD 01/27/24

## 2024-01-27 NOTE — Assessment & Plan Note (Signed)
 09/19/2020: Right breast biopsy: T1CN0 stage Ia grade 3 IDC ER/PR positive, HER2 equal vocal, FISH negative, Ki-67 10% (genetics negative) 10/19/2020: Left lumpectomy: Grade 2 IDC negative margins (Oncotype DX: Score 15: 4% ROR) 12/20/2020-01/10/2021: Adjuvant radiation   Current treatment: Anastrozole  started neoadjuvantly 09/21/2020 Anastrozole  toxicities: Severely dry skin Joint stiffness and achiness 3.  Hair thinning   Breast cancer surveillance: 1.  Breast exam 01/27/2024: Benign 2. mammogram 11/14/2023: Benign breast density category B 3.  Bone density: 08/13/2022: T score -3.9 (was -3.6): Profound osteoporosis On Prolia  every 6 months Muscle spasm at night.   Return to clinic every 6 months for Prolia  injections with labs I will see her in 1 year.

## 2024-01-28 ENCOUNTER — Telehealth: Payer: Self-pay | Admitting: Hematology and Oncology

## 2024-01-28 DIAGNOSIS — H43811 Vitreous degeneration, right eye: Secondary | ICD-10-CM | POA: Diagnosis not present

## 2024-01-28 NOTE — Telephone Encounter (Signed)
 Confirmed with pt about scheduled appt dates and times

## 2024-02-04 DIAGNOSIS — M1120 Other chondrocalcinosis, unspecified site: Secondary | ICD-10-CM | POA: Diagnosis not present

## 2024-02-04 DIAGNOSIS — M1712 Unilateral primary osteoarthritis, left knee: Secondary | ICD-10-CM | POA: Diagnosis not present

## 2024-02-04 DIAGNOSIS — S83412A Sprain of medial collateral ligament of left knee, initial encounter: Secondary | ICD-10-CM | POA: Diagnosis not present

## 2024-02-18 ENCOUNTER — Telehealth: Payer: Self-pay

## 2024-02-18 ENCOUNTER — Ambulatory Visit: Payer: Self-pay

## 2024-02-18 ENCOUNTER — Encounter: Payer: Self-pay | Admitting: Family Medicine

## 2024-02-18 ENCOUNTER — Telehealth (INDEPENDENT_AMBULATORY_CARE_PROVIDER_SITE_OTHER): Admitting: Family Medicine

## 2024-02-18 VITALS — Temp 99.1°F | Ht 63.0 in

## 2024-02-18 DIAGNOSIS — U071 COVID-19: Secondary | ICD-10-CM | POA: Diagnosis not present

## 2024-02-18 DIAGNOSIS — M25562 Pain in left knee: Secondary | ICD-10-CM | POA: Diagnosis not present

## 2024-02-18 MED ORDER — NIRMATRELVIR/RITONAVIR (PAXLOVID) TABLET (RENAL DOSING): 2.0000 | ORAL_TABLET | Freq: Two times a day (BID) | ORAL | 0 refills | Status: DC

## 2024-02-18 NOTE — Progress Notes (Signed)
 [facility] [address], [address] [place], [place] [address] [phone]  Date:  02/18/2024   Name:  Dawn Powers   DOB:  10/17/1945   MRN:  [id number]  PCP:  Kaylee Partridge, MD    Chief Complaint: Covid Positive (Pt states she tested positive for COVID on 02/16/2024)   History of Present Illness:  Dawn Powers is a 78 y.o. very pleasant female patient who presents with the following:  Patient seen today for a virtual visit due to concern of COVID-19.  I saw her most recently last month for a trigger finger -history of pre-diabetes, HTN, endometrial cancer s/p hyst 2016, chronic back pain, breast cancer dx 12/21, osteoporosis  She uses hydrocodone  occasionally for back and knee pain Prolia  for osteoporosis   Patient location is home,my location is office.  Patient identity confirmed with 2 factors, she gives consent for virtual visit today.  The patient and myself are present on the call today She started not feeling great on Sunday- today is Wednesday Monday she was feeling worse and tested for covid- positive  Her husband was recently in [place] and brought covid back  She feels like she might be a little on the upswing today-feeling a bit better She notes a productive cough, severe ST, some HA, mild temp to about 99 She is using otc antipyretics as needed She took a hydrocodone  last night for cough; she had some on hand.  It helped her to sleep No diarrhea She has felt nauseated on occasion  She is also on crutches for a sprained MCL and is doing PT    Patient Active Problem List   Diagnosis Date Noted   Osteoporosis 06/12/2021   Genetic testing 12/18/2020   Family history of prostate cancer    Family history of breast cancer    Family history of kidney cancer    Malignant neoplasm of upper-outer quadrant of left breast in female, 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/21/2020   Prediabetes 07/02/2019   S/P  shoulder replacement, 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   Endometrial cancer (HCC) 09/26/2015   Essential hypertension 05/23/2015   Thoracic facet joint syndrome 04/27/2014   Back pain 03/24/2014   Muscle spasm 03/24/2014   Idiopathic scoliosis and kyphoscoliosis 12/22/2012   Osteopenia 12/22/2012   Other and unspecified hyperlipidemia 12/22/2012    Past Medical History:  Diagnosis Date   Anxiety    Arthritis    Breast cancer (HCC) 09/19/2020   left breast [facility] w/ radiation no chemo   Cancer (HCC)    Endometrial    Family history of breast cancer    Family history of kidney cancer    Family history of prostate cancer    Heart murmur    Hyperlipidemia    Hypertension    Neuromuscular disorder (HCC)    DDD   Osteoporosis 06/12/2021   Pain    lower back -hx "DDD"   Personal history of radiation therapy    left [facility] 08/2020 w/ radiation no chemo   Radiation 11/15/15, 11/23/15, 11/30/15, 12/07/15, 12/14/15   HDR brachytherapy    Past Surgical History:  Procedure Laterality Date   ABDOMINAL HYSTERECTOMY     BREAST LUMPECTOMY Left 10/19/2020   left lumpectomy 10-19-20   BREAST LUMPECTOMY WITH RADIOACTIVE SEED AND SENTINEL LYMPH NODE BIOPSY Left 10/19/2020   Procedure: LEFT BREAST LUMPECTOMY WITH RADIOACTIVE SEED AND SENTINEL LYMPH NODE BIOPSY;  Surgeon: Lockie Rima, MD;  Location: [facility];  Service: General;  Laterality: Left;  PEC BLOCK, RNFA   CATARACT EXTRACTION Bilateral    COLONOSCOPY     FRACTURE SURGERY Right    ankle   GANGLION CYST EXCISION     LYMPH NODE BIOPSY N/A 09/26/2015   Procedure: SENTINEL LYMPH NODE BIOPSY;  Surgeon: Alphonso Aschoff, MD;  Location: [facility];  Service: Gynecology;  Laterality: N/A;   ROBOTIC ASSISTED TOTAL HYSTERECTOMY WITH BILATERAL SALPINGO OOPHERECTOMY Bilateral 09/26/2015   Procedure: ROBOTIC ASSISTED TOTAL HYSTERECTOMY WITH BILATERAL SALPINGO OOPHORECTOMY;  Surgeon: Alphonso Aschoff, MD;  Location: [facility];  Service: Gynecology;  Laterality: Bilateral;    TOTAL SHOULDER ARTHROPLASTY Left 09/10/2018   Procedure: TOTAL SHOULDER ARTHROPLASTY;  Surgeon: Ellard Gunning, MD;  Location: [facility];  Service: Orthopedics;  Laterality: Left;    WISDOM TOOTH EXTRACTION     WISDOM TOOTH EXTRACTION      Social History   Tobacco Use   Smoking status: Former    Current packs/day: 0.00    Types: Cigarettes    Start date: 09/30/1994    Quit date: 09/30/1998    Years since quitting: 25.4   Smokeless tobacco: Never  Vaping Use   Vaping status: Never Used  Substance Use Topics   Alcohol use: Yes    Alcohol/week: 3.0 standard drinks of alcohol    Types: 3 Standard drinks or equivalent per week    Comment: 1 drink 3 times per week   Drug use: No    Family History  Problem Relation Age of Onset   Heart disease Mother    Alcohol abuse Mother        breast cancer - questionable    Dementia Mother    Alcohol abuse Father    Prostate cancer Father 30       prostate and colon- unsure of primary    Kidney cancer Brother 63   Alcohol abuse Maternal Grandfather    Alcohol abuse Sister    Basal cell carcinoma Sister    Breast cancer Cousin        double mastectomy, d. 25   Prostate cancer Brother 33   Esophageal cancer Neg Hx    Stomach cancer Neg Hx    Rectal cancer Neg Hx     Allergies  Allergen Reactions   Morphine Itching    Medication list has been reviewed and updated.  Current Outpatient Medications on File Prior to Visit  Medication Sig Dispense Refill   amLODipine  (NORVASC ) 5 MG tablet Take 1 tablet (5 mg total) by mouth daily. 90 tablet 3   anastrozole  (ARIMIDEX ) 1 MG tablet Take 1 tablet (1 mg total) by mouth daily. 90 tablet 4   cholecalciferol (VITAMIN D3) 25 MCG (1000 UNIT) tablet Take 2 tablets (2,000 Units total) by mouth daily.     Cranberry 250 MG CAPS Take 1 capsule by mouth daily.     Cyanocobalamin  (VITAMIN B 12) 500 MCG TABS Take by mouth.     denosumab  (PROLIA ) 60 MG/ML SOSY injection Inject 60 mg into the skin every  6 (six) months. 1 mL 0   HYDROcodone -acetaminophen  (NORCO/VICODIN) 5-325 MG tablet TAKE 1 OR 2 TABLETS EVERY 6 HOURS AS NEEDED FOR PAIN. 90 tablet 0   losartan  (COZAAR ) 100 MG tablet Take 1 tablet (100 mg total) by mouth daily. 90 tablet 3   traZODone  (DESYREL ) 50 MG tablet TAKE 1 AND 1/2 TABLETS BY MOUTH AT BEDTIME AS NEEDED FOR SLEEP 135 tablet 1   No current facility-administered  medications on file prior to visit.    Review of Systems:  As per HPI- otherwise negative.   Physical Examination: Vitals:   02/18/24 1113  Temp: 99.1 F (37.3 C)   Vitals:   02/18/24 1113  Height: 5\' 3"  (1.6 m)   Body mass index is 21.75 kg/m. Ideal Body Weight: Weight in (lb) to have BMI = 25: 140.8  Pt observed via mychart video - she looks well, coughing at times   Assessment and Plan: COVID-19 - Plan: nirmatrelvir/ritonavir, renal dosing, (PAXLOVID) 10 x 150 MG & 10 x 100MG  TABS  Patient seen today with concern of COVID-19.  She got sick about 3-1/2 days ago, tested +2 days ago.  She is interested in a prescription for Paxlovid but is not quite sure if she wishes to take it due to potential side effects.  I counseled her given her age Paxlovid may be prudent but certainly this is up to her assuming her symptoms remain mild.  Prescription sent to [facility].  We discussed how to control her other symptoms such as nasal congestion, mild fevers, cough.  She will let me know if she is not continuing to get better or if she starts getting worse  Signed Gates Kasal, MD

## 2024-02-18 NOTE — Telephone Encounter (Signed)
 Scheduled pt a VV for this afternoon.

## 2024-02-18 NOTE — Telephone Encounter (Signed)
 Initial Comment Caller states she has COVID. She has a low grade fever and a cough. She took Acetaminophen  and Hydrocodone . She is requesting Paxlovid. Translation No Disp. Time [facility] Time) Disposition Final User 02/17/2024 7:57:34 PM Attempt made - message left Leos, RN, Edwina Gram 02/17/2024 8:17:15 PM FINAL ATTEMPT MADE - message left Yes Leos, RN, Edwina Gram Final Disposition 02/17/2024 8:17:15 PM FINAL ATTEMPT MADE - message left Yes Leos, RN, Edwina Gram Comments User: Cecilie Coffee, RN Date/Time [facility] Time): 02/17/2024 8:17:33 PM Calls go straight to voice mail

## 2024-02-18 NOTE — Telephone Encounter (Signed)
 Chief Complaint: COVID 19 Symptoms: cough, sore throat, nasal congestion, body aches, headaches Frequency: x 2-3 days Pertinent Negatives: Patient denies loss of taste or smell, chest pain, difficulty breathing Disposition: [] ED /[] Urgent Care (no appt availability in office) / [] Appointment(In office/virtual)/ []  [place] [facility]/ [] Home Care/ [] Refused Recommended Disposition /[] [place] [facility]/ [x]  Follow-up with PCP Additional Notes: Patient treating at home with naproxen, acetaminophen , and hydrocodone . She tried warm salt water  gargles last night. Patient wanted to also let her PCP know she tore her MCL and is being treated and doing physical therapy. Patient would like a phone call from provider if she is going to prescribe Paxlovid and she is requesting something else for her cough. Offered virtual visit for patient with PCP today but she states she has a guest staying in the guest room where her computer is. She states she would prefer to speak over the phone.   Copied from CRM [id number]. Topic: Clinical - Medical Advice >> Feb 18, 2024  8:13 AM Dawn Powers wrote: Reason for CRM: Tested positive for Covid-19 on Monday. Seeking medical advice for a prescription. Reason for Disposition  [1] HIGH RISK patient (e.g., weak immune system, age > 64 years, obesity with BMI 30 or higher, pregnant, chronic lung disease or other chronic medical condition) AND [2] COVID symptoms (e.g., cough, fever)  (Exceptions: Already seen by PCP and no new or worsening symptoms.)  Answer Assessment - Initial Assessment Questions 1. COVID-19 DIAGNOSIS: "How do you know that you have COVID?" (e.g., positive lab test or self-test, diagnosed by doctor or NP/PA, symptoms after exposure).     Home self test positive on Monday 02/16/24.  2. COVID-19 EXPOSURE: "Was there any known exposure to COVID before the symptoms began?" CDC Definition of close contact: within 6 feet (2 meters) for a total of 15 minutes or  more over a 24-hour period.      Yes, her husband was COVID positive.  3. ONSET: "When did the COVID-19 symptoms start?"      Sunday 02/15/24.  4. WORST SYMPTOM: "What is your worst symptom?" (e.g., cough, fever, shortness of breath, muscle aches)     Sore throat, cough, nasal congestion, headache.  5. COUGH: "Do you have a cough?" If Yes, ask: "How bad is the cough?"       Yes. She states she was able to sleep thru most of the night. She only had one coughing fit that woke her up last night.  6. FEVER: "Do you have a fever?" If Yes, ask: "What is your temperature, how was it measured, and when did it start?"     Denies, highest was 99 degrees.  7. RESPIRATORY STATUS: "Describe your breathing?" (e.g., normal; shortness of breath, wheezing, unable to speak)      Normal, just hard to breathe out of nose when congested.  8. BETTER-SAME-WORSE: "Are you getting better, staying the same or getting worse compared to yesterday?"  If getting worse, ask, "In what way?"     Sore throat is worse today, other wise improved or same.  9. OTHER SYMPTOMS: "Do you have any other symptoms?"  (e.g., chills, fatigue, headache, loss of smell or taste, muscle pain, sore throat)     Body aches.  10. HIGH RISK DISEASE: "Do you have any chronic medical problems?" (e.g., asthma, heart or lung disease, weak immune system, obesity, etc.)       No.  11. VACCINE: "Have you had the COVID-19 vaccine?" If Yes, ask: "Which  one, how many shots, when did you get it?"       Yes, she states she has received about 5 doses. Unsure the dates.  12. PREGNANCY: "Is there any chance you are pregnant?" "When was your last menstrual period?"       N/A.  13. O2 SATURATION MONITOR:  "Do you use an oxygen saturation monitor (pulse oximeter) at home?" If Yes, ask "What is your reading (oxygen level) today?" "What is your usual oxygen saturation reading?" (e.g., 95%)       No.  Protocols used: Coronavirus (COVID-19) Diagnosed or  Suspected-A-AH

## 2024-02-19 ENCOUNTER — Encounter: Payer: Self-pay | Admitting: Oncology

## 2024-02-19 ENCOUNTER — Other Ambulatory Visit (HOSPITAL_BASED_OUTPATIENT_CLINIC_OR_DEPARTMENT_OTHER): Payer: Self-pay

## 2024-02-19 ENCOUNTER — Telehealth: Payer: Self-pay

## 2024-02-19 ENCOUNTER — Encounter: Payer: Self-pay | Admitting: Internal Medicine

## 2024-02-19 ENCOUNTER — Ambulatory Visit (INDEPENDENT_AMBULATORY_CARE_PROVIDER_SITE_OTHER): Admitting: Internal Medicine

## 2024-02-19 VITALS — BP 126/60 | HR 72 | Temp 98.0°F | Ht 63.0 in | Wt 123.0 lb

## 2024-02-19 DIAGNOSIS — J029 Acute pharyngitis, unspecified: Secondary | ICD-10-CM | POA: Diagnosis not present

## 2024-02-19 DIAGNOSIS — U071 COVID-19: Secondary | ICD-10-CM

## 2024-02-19 LAB — POCT RAPID STREP A (OFFICE): Rapid Strep A Screen: NEGATIVE

## 2024-02-19 MED ORDER — NIRMATRELVIR/RITONAVIR (PAXLOVID) TABLET (RENAL DOSING)
2.0000 | ORAL_TABLET | Freq: Two times a day (BID) | ORAL | 0 refills | Status: AC
  Filled 2024-02-19: qty 20, 5d supply, fill #0

## 2024-02-19 NOTE — Patient Instructions (Signed)
 VISIT SUMMARY:  Today, you were seen for a persistent sore throat and concerns about COVID-19 complications. You have a history of childhood rheumatic fever and a heart murmur. You have been experiencing a sore throat, cough, stuffy nose, and fatigue following a COVID-19 infection. You also reported sensations of your heart skipping beats and dizziness. A strep test was conducted and returned negative.  YOUR PLAN:  -COVID-19 WITH CARDIAC INVOLVEMENT: You have a COVID-19 infection that may be affecting your heart, causing symptoms like missed heartbeats and dizziness. We discussed the potential risks, including hospitalization and prolonged cardiac issues. To reduce the severity of COVID-19 and prevent further heart complications, I have prescribed Paxlovid. Please pick up the prescription from [facility] pharmacy.  -ATRIAL FIBRILLATION: Atrial fibrillation is a condition where the heart beats irregularly and is likely caused by your COVID-19 infection. You are not experiencing severe symptoms, and your heart rate is stable. This condition may improve as your COVID-19 is treated.  -PHARYNGITIS DUE TO COVID-19: Your persistent sore throat is due to the COVID-19 infection. Although you have white patches in your throat, the strep test was negative, confirming that COVID-19 is the cause. The lack of improvement suggests ongoing viral activity.  -HEART MURMUR: You have a heart murmur from childhood rheumatic fever, but no murmur was detected during today's examination. Our focus is on managing the current heart issues related to COVID-19.  INSTRUCTIONS:  Please pick up your Paxlovid prescription from [facility] pharmacy. Follow the dosage instructions carefully. If you experience any worsening symptoms or new issues, contact our office immediately. Continue to rest and stay hydrated. Schedule a follow-up appointment in one week to monitor your progress.

## 2024-02-19 NOTE — Telephone Encounter (Signed)
 Called patient, explained that Paxlovid is preferred treatment for COVID right now although molnupiravir could possibly be beneficial.  We would need to call around and find a molnupiravir prescription  Patient states her main concern right now is sore throat, she is concerned she could have strep throat due to red throat with white patches.  I advised strep throat is not overly common in older adults unless there was a direct exposure, I would be more suspicious this is part of her COVID.  We will plan to get her seen this afternoon so she can be tested for strep and checked on in person

## 2024-02-19 NOTE — Progress Notes (Signed)
 ==============================  [facility]: [phone]   -- Medical Office Visit --  Patient: Dawn Powers      Age: 78 y.o.       Sex:  female  Date:   02/19/2024 Today's Healthcare Provider: Anthon Kins, MD  ==============================   Chief Complaint: Covid Positive (Pt states husband has been on plane and has had covid. She tested positive on Monday coughing nasal congested sore throat. Mild head ache no fevers.)  Discussed the use of AI scribe software for clinical note transcription with the patient, who gave verbal consent to proceed.  History of Present Illness  78 year old female with a history of childhood rheumatic fever and heart murmur who presents with a persistent sore throat and concerns about COVID-19 complications. She is accompanied by her husband.  She has been experiencing a persistent sore throat following a COVID-19 infection, with white patches in her throat, initially suspecting strep throat. Alongside the sore throat, she has had a cough and stuffy nose, which have improved, but she continues to feel fatigued. No difficulty swallowing or significant trouble breathing. She has not used an inhaler and has not been on steroids or immunosuppressants recently.  She has a history of a heart murmur due to childhood rheumatic fever. Recently, she has felt sensations of her heart skipping beats and dizziness, which she associates with her current illness. She is concerned about the potential impact of COVID-19 on her heart. No EKG was performed during this visit due to cost concerns.  Her current medications and treatments for COVID-19 include consideration of Paxlovid, although she has not yet obtained it due to cost and availability issues. Her breathing has improved, and she has not had a significant fever. Her husband previously had COVID-19 but recovered without persistent symptoms.  A strep test was conducted and  returned negative.  Background Reviewed: Problem List: has Idiopathic scoliosis and kyphoscoliosis; Osteopenia; Other and unspecified hyperlipidemia; Back pain; Muscle spasm; Thoracic facet joint syndrome; Essential hypertension; Endometrial cancer (HCC); S/P shoulder replacement, left; Prediabetes; Malignant neoplasm of upper-outer quadrant of left breast in female, estrogen receptor positive (HCC); Family history of prostate cancer; Family history of breast cancer; Family history of kidney cancer; Genetic testing; and Osteoporosis on their problem list. Past Medical History:  has a past medical history of Anxiety, Arthritis, Breast cancer (HCC) (09/19/2020), Cancer [facility]), Family history of breast cancer, Family history of kidney cancer, Family history of prostate cancer, Heart murmur, Hyperlipidemia, Hypertension, Neuromuscular disorder (HCC), Osteoporosis (06/12/2021), Pain, Personal history of radiation therapy, and Radiation (11/15/15, 11/23/15, 11/30/15, 12/07/15, 12/14/15). Past Surgical History:   has a past surgical history that includes Fracture surgery (Right); Ganglion cyst excision; Colonoscopy; Robotic assisted total hysterectomy with bilateral salpingo oophorectomy (Bilateral, 09/26/2015); Lymph node biopsy (N/A, 09/26/2015); Abdominal hysterectomy; Wisdom tooth extraction; Total shoulder arthroplasty (Left, 09/10/2018); Breast lumpectomy with radioactive seed and sentinel lymph node biopsy (Left, 10/19/2020); Breast lumpectomy (Left, 10/19/2020); Cataract extraction (Bilateral); and Wisdom tooth extraction. Social History:   reports that she quit smoking about 25 years ago. Her smoking use included cigarettes. She started smoking about 29 years ago. She has never used smokeless tobacco. She reports current alcohol use of about 3.0 standard drinks of alcohol per week. She reports that she does not use drugs. Family History:  family history includes Alcohol abuse in her father, maternal grandfather,  mother, and sister; Basal cell carcinoma in her sister; Breast cancer in her cousin; Dementia in her  mother; Heart disease in her mother; Kidney cancer (age of onset: 54) in her brother; Prostate cancer (age of onset: 36) in her father; Prostate cancer (age of onset: 72) in her brother. Allergies:  is allergic to morphine.   Medication Reconciliation: Current Outpatient Medications on File Prior to Visit  Medication Sig   amLODipine  (NORVASC ) 5 MG tablet Take 1 tablet (5 mg total) by mouth daily.   anastrozole  (ARIMIDEX ) 1 MG tablet Take 1 tablet (1 mg total) by mouth daily.   cholecalciferol (VITAMIN D3) 25 MCG (1000 UNIT) tablet Take 2 tablets (2,000 Units total) by mouth daily.   Cranberry 250 MG CAPS Take 1 capsule by mouth daily.   Cyanocobalamin  (VITAMIN B 12) 500 MCG TABS Take by mouth.   denosumab  (PROLIA ) 60 MG/ML SOSY injection Inject 60 mg into the skin every 6 (six) months.   HYDROcodone -acetaminophen  (NORCO/VICODIN) 5-325 MG tablet TAKE 1 OR 2 TABLETS EVERY 6 HOURS AS NEEDED FOR PAIN.   losartan  (COZAAR ) 100 MG tablet Take 1 tablet (100 mg total) by mouth daily.   traZODone  (DESYREL ) 50 MG tablet TAKE 1 AND 1/2 TABLETS BY MOUTH AT BEDTIME AS NEEDED FOR SLEEP   No current facility-administered medications on file prior to visit.   Medications Discontinued During This Encounter  Medication Reason   nirmatrelvir/ritonavir, renal dosing, (PAXLOVID) 10 x 150 MG & 10 x 100MG  TABS Reorder     Physical Exam:    02/19/2024    3:18 PM 02/18/2024   11:13 AM 01/27/2024    9:20 AM  Vitals with BMI  Height 5\' 3"  5\' 3"  5\' 3"   Weight 123 lbs  122 lbs 13 oz  BMI 21.79  21.76  Systolic 126  158  Diastolic 60  57  Pulse 72  62  Vital signs reviewed.  Nursing notes reviewed. Weight trend reviewed. Physical Exam General Appearance:  No acute distress appreciable.   Well-groomed, healthy-appearing female.  Well proportioned with no abnormal fat distribution.  Good muscle tone. Pulmonary:   Normal work of breathing at rest, no respiratory distress apparent. SpO2: 98 %  Musculoskeletal: All extremities are intact.  Neurological:  Awake, alert, oriented, and engaged.  No obvious focal neurological deficits or cognitive impairments.  Sensorium seems unclouded.   Speech is clear and coherent with logical content. Psychiatric:  Appropriate mood, pleasant and cooperative demeanor, thoughtful and engaged during the exam Physical Exam HEENT: White exudate on tongue, normal oropharynx. CHEST: Clear to auscultation bilaterally. CARDIOVASCULAR: Heart with arrhythmia vs missed beats.- when patient advised she reports she has felt that some, but no shortness of breath.   Results for orders placed or performed in visit on 02/19/24  POCT rapid strep A  Result Value Ref Range   Rapid Strep A Screen Negative Negative   Office Visit on 02/19/2024  Component Date Value   Rapid Strep A Screen 02/19/2024 Negative   Appointment on 01/27/2024  Component Date Value   Sodium 01/27/2024 141    Potassium 01/27/2024 4.0    Chloride 01/27/2024 104    CO2 01/27/2024 30    Glucose, Bld 01/27/2024 86    BUN 01/27/2024 21    Creatinine 01/27/2024 1.04 (H)    Calcium 01/27/2024 9.5    Total Protein 01/27/2024 6.9    Albumin 01/27/2024 4.4    AST 01/27/2024 22    ALT 01/27/2024 19    Alkaline Phosphatase 01/27/2024 37 (L)    Total Bilirubin 01/27/2024 0.5    GFR, Estimated 01/27/2024 55 (L)  Anion gap 01/27/2024 7    WBC Count 01/27/2024 4.5    RBC 01/27/2024 4.09    Hemoglobin 01/27/2024 13.2    HCT 01/27/2024 38.4    MCV 01/27/2024 93.9    MCH 01/27/2024 32.3    MCHC 01/27/2024 34.4    RDW 01/27/2024 12.4    Platelet Count 01/27/2024 238    nRBC 01/27/2024 0.0    Neutrophils Relative % 01/27/2024 64    Neutro Abs 01/27/2024 2.8    Lymphocytes Relative 01/27/2024 22    Lymphs Abs 01/27/2024 1.0    Monocytes Relative 01/27/2024 10    Monocytes Absolute 01/27/2024 0.5    Eosinophils  Relative 01/27/2024 3    Eosinophils Absolute 01/27/2024 0.2    Basophils Relative 01/27/2024 1    Basophils Absolute 01/27/2024 0.1    Immature Granulocytes 01/27/2024 0    Abs Immature Granulocytes 01/27/2024 0.01   Office Visit on 08/14/2023  Component Date Value   Alcohol Metabolites 08/14/2023 NEGATIVE    Amphetamines 08/14/2023 NEGATIVE    Benzodiazepines 08/14/2023 NEGATIVE    Buprenorphine, Urine 08/14/2023 NEGATIVE    Cocaine Metabolite 08/14/2023 NEGATIVE    6 Acetylmorphine 08/14/2023 NEGATIVE    Marijuana Metabolite 08/14/2023 NEGATIVE    MDMA 08/14/2023 NEGATIVE    Opiates 08/14/2023 NEGATIVE    Oxycodone  08/14/2023 NEGATIVE    Creatinine 08/14/2023 43.9    pH 08/14/2023 7.2    Oxidant 08/14/2023 NEGATIVE    Hgb A1c MFr Bld 08/14/2023 5.9    Magnesium 08/14/2023 2.3    TSH 08/14/2023 3.49    Cholesterol 08/14/2023 251 (H)    Triglycerides 08/14/2023 80.0    HDL 08/14/2023 83.70    VLDL 08/14/2023 16.0    LDL Cholesterol 08/14/2023 152 (H)    Total CHOL/HDL Ratio 08/14/2023 3    NonHDL 08/14/2023 167.66    Notes and Comments 08/14/2023    Appointment on 07/29/2023  Component Date Value   Sodium 07/29/2023 139    Potassium 07/29/2023 3.8    Chloride 07/29/2023 103    CO2 07/29/2023 31    Glucose, Bld 07/29/2023 117 (H)    BUN 07/29/2023 20    Creatinine 07/29/2023 1.03 (H)    Calcium 07/29/2023 9.9    Total Protein 07/29/2023 7.0    Albumin 07/29/2023 4.5    AST 07/29/2023 18    ALT 07/29/2023 11    Alkaline Phosphatase 07/29/2023 37 (L)    Total Bilirubin 07/29/2023 0.6    GFR, Estimated 07/29/2023 55 (L)    Anion gap 07/29/2023 5    WBC Count 07/29/2023 4.8    RBC 07/29/2023 3.84 (L)    Hemoglobin 07/29/2023 12.5    HCT 07/29/2023 36.6    MCV 07/29/2023 95.3    MCH 07/29/2023 32.6    MCHC 07/29/2023 34.2    RDW 07/29/2023 12.2    Platelet Count 07/29/2023 248    nRBC 07/29/2023 0.0    Neutrophils Relative % 07/29/2023 67    Neutro Abs  07/29/2023 3.3    Lymphocytes Relative 07/29/2023 22    Lymphs Abs 07/29/2023 1.0    Monocytes Relative 07/29/2023 8    Monocytes Absolute 07/29/2023 0.4    Eosinophils Relative 07/29/2023 2    Eosinophils Absolute 07/29/2023 0.1    Basophils Relative 07/29/2023 1    Basophils Absolute 07/29/2023 0.0    Immature Granulocytes 07/29/2023 0    Abs Immature Granulocytes 07/29/2023 0.00   No image results found. No results found.      04/22/2023  11:45 AM 07/17/2022    9:52 AM 11/23/2021   10:45 AM 11/23/2021   10:44 AM  PHQ 2/9 Scores  PHQ - 2 Score 0 1 0 0  PHQ- 9 Score 0 3     Results LABS Strep swab: Negative (02/19/2024)  DIAGNOSTIC Lung auscultation: Clear (02/19/2024) Heart auscultation: Irregular rhythm, possible atrial fibrillation (02/19/2024)    Assessment & Plan COVID She presents with COVID-19 infection and suspected cardiac involvement, including atrial fibrillation and missed heartbeats. Symptoms have persisted for four days, with pharyngitis and fatigue. COVID-19 may be affecting her heart, risking complications if untreated. Hospitalization and prolonged cardiac issues were discussed. Prescribe Paxlovid, despite its cost, as it reduces COVID-19 severity and prevents further cardiac complications. Although specific survival data is lacking, dramatic improvement is common with Paxlovid. Send the prescription to [facility] pharmacy.  arrhythmia is likely secondary to COVID-19 infection. She is not experiencing significant symptoms like dyspnea, and her heart rate is reasonable. Atrial fibrillation may resolve with COVID-19 treatment. Sore throat Persistent pharyngitis is associated with COVID-19 infection. Leukoplakia is observed in the throat, but a negative strep test confirms COVID-19 as the cause. The lack of improvement raises concern for ongoing viral activity. COVID-19       Orders Placed During this Encounter:   Orders Placed This Encounter   Procedures   POCT rapid strep A   Meds ordered this encounter  Medications   nirmatrelvir/ritonavir, renal dosing, (PAXLOVID) 10 x 150 MG & 10 x 100MG  TABS    Sig: Take 2 tablets by mouth 2 (two) times daily for 5 days. (Take nirmatrelvir 150 mg one tablet twice daily for 5 days and ritonavir 100 mg one tablet twice daily for 5 days) Patient GFR is 55    Dispense:  20 tablet    Refill:  0        This document was synthesized by artificial intelligence ([employer]bridge) using HIPAA-compliant recording of the clinical interaction;   We discussed the use of AI scribe software for clinical note transcription with the patient, who gave verbal consent to proceed. additional Info: This encounter employed state-of-the-art, real-time, collaborative documentation. The patient actively reviewed and assisted in updating their electronic medical record on a shared screen, ensuring transparency and facilitating joint problem-solving for the problem list, overview, and plan. This approach promotes accurate, informed care. The treatment plan was discussed and reviewed in detail, including medication safety, potential side effects, and all patient questions. We confirmed understanding and comfort with the plan. Follow-up instructions were established, including contacting the office for any concerns, returning if symptoms worsen, persist, or new symptoms develop, and precautions for potential emergency department visits.

## 2024-02-19 NOTE — Telephone Encounter (Signed)
 Copied from CRM [id number]. Topic: Clinical - Medication Question >> Feb 19, 2024 12:18 PM [place] M wrote: Reason for CRM: Patiens was prescribed paxlovid yesterday and it is over $700, wanting to know if something else can be called her today, she is not feeling well.

## 2024-02-20 ENCOUNTER — Encounter: Payer: Self-pay | Admitting: Internal Medicine

## 2024-02-20 ENCOUNTER — Encounter: Payer: Self-pay | Admitting: Family Medicine

## 2024-02-23 DIAGNOSIS — M25562 Pain in left knee: Secondary | ICD-10-CM | POA: Diagnosis not present

## 2024-02-24 NOTE — Progress Notes (Unsigned)
 [facility] [address] [place], [place] [address] [phone]  Date:  02/25/2024   Name:  Dawn Powers   DOB:  10/11/1945   MRN:  [id number]  PCP:  Kaylee Partridge, MD    Chief Complaint: No chief complaint on file.   History of Present Illness:  Dawn Powers is a 78 y.o. very pleasant female patient who presents with the following:  Patient seen today for follow-up, last week she was ill with COVID-19 and apparently there was concern of irregular heart rate at her visit.  However EKG was not done at that time.  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   Prediabetes 07/02/2019   S/P shoulder replacement, 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   Endometrial cancer (HCC) 09/26/2015   Essential hypertension 05/23/2015   Thoracic facet joint syndrome 04/27/2014   Back pain 03/24/2014   Muscle spasm 03/24/2014   Idiopathic scoliosis and kyphoscoliosis 12/22/2012   Osteopenia 12/22/2012   Other and unspecified hyperlipidemia 12/22/2012    Past Medical History:  Diagnosis Date   Anxiety    Arthritis    Breast cancer (HCC) 09/19/2020   left breast [facility] w/ radiation no chemo   Cancer (HCC)    Endometrial    Family history of breast cancer    Family history of kidney cancer    Family history of prostate cancer    Heart murmur    Hyperlipidemia    Hypertension    Neuromuscular disorder (HCC)    DDD    Osteoporosis 06/12/2021   Pain    lower back -hx "DDD"   Personal history of radiation therapy    left [facility] 08/2020 w/ radiation no chemo   Radiation 11/15/15, 11/23/15, 11/30/15, 12/07/15, 12/14/15   HDR brachytherapy    Past Surgical History:  Procedure Laterality Date   ABDOMINAL HYSTERECTOMY     BREAST LUMPECTOMY Left 10/19/2020   left lumpectomy 10-19-20   BREAST LUMPECTOMY WITH RADIOACTIVE SEED AND SENTINEL LYMPH NODE BIOPSY Left 10/19/2020   Procedure: LEFT BREAST LUMPECTOMY WITH RADIOACTIVE SEED AND SENTINEL LYMPH NODE BIOPSY;  Surgeon: Lockie Rima, MD;  Location: [facility];  Service: General;  Laterality: Left;  PEC BLOCK, RNFA   CATARACT EXTRACTION Bilateral    COLONOSCOPY     FRACTURE SURGERY Right    ankle   GANGLION CYST EXCISION     LYMPH NODE BIOPSY N/A 09/26/2015   Procedure: SENTINEL LYMPH NODE BIOPSY;  Surgeon: Alphonso Aschoff, MD;  Location: [facility];  Service: Gynecology;  Laterality: N/A;   ROBOTIC ASSISTED TOTAL HYSTERECTOMY WITH BILATERAL SALPINGO OOPHERECTOMY Bilateral 09/26/2015   Procedure: ROBOTIC ASSISTED TOTAL HYSTERECTOMY WITH BILATERAL SALPINGO OOPHORECTOMY;  Surgeon: Alphonso Aschoff, MD;  Location: [facility];  Service: Gynecology;  Laterality: Bilateral;   TOTAL SHOULDER ARTHROPLASTY Left 09/10/2018   Procedure: TOTAL SHOULDER ARTHROPLASTY;  Surgeon: Ellard Gunning, MD;  Location: [facility];  Service: Orthopedics;  Laterality: Left;    WISDOM TOOTH EXTRACTION     WISDOM TOOTH EXTRACTION      Social History   Tobacco Use   Smoking status: Former    Current packs/day: 0.00    Types: Cigarettes    Start date: 09/30/1994    Quit date: 09/30/1998    Years since quitting: 25.4   Smokeless tobacco: Never  Vaping Use   Vaping status: Never Used  Substance Use Topics   Alcohol use: Yes    Alcohol/week: 3.0 standard drinks of alcohol    Types: 3 Standard drinks or equivalent per week    Comment: 1 drink 3 times per week   Drug use: No    Family History   Problem Relation Age of Onset   Heart disease Mother    Alcohol abuse Mother        breast cancer - questionable    Dementia Mother    Alcohol abuse Father    Prostate cancer Father 72       prostate and colon- unsure of primary    Kidney cancer Brother 75   Alcohol abuse Maternal Grandfather    Alcohol abuse Sister    Basal cell carcinoma Sister    Breast cancer Cousin        double mastectomy, d. 40   Prostate cancer Brother 58   Esophageal cancer Neg Hx    Stomach cancer Neg Hx    Rectal cancer Neg Hx     Allergies  Allergen Reactions   Morphine Itching    Medication list has been reviewed and updated.  Current Outpatient Medications on File Prior to Visit  Medication Sig Dispense Refill   amLODipine  (NORVASC ) 5 MG tablet Take 1 tablet (5 mg total) by mouth daily. 90 tablet 3   anastrozole  (ARIMIDEX ) 1 MG tablet Take 1 tablet (1 mg total) by mouth daily. 90 tablet 4   cholecalciferol (VITAMIN D3) 25 MCG (1000 UNIT) tablet Take 2 tablets (2,000 Units total) by mouth daily.     Cranberry 250 MG CAPS Take 1 capsule by mouth daily.     Cyanocobalamin  (VITAMIN B 12) 500 MCG TABS Take by mouth.     denosumab  (PROLIA ) 60 MG/ML SOSY injection Inject 60 mg into the skin every 6 (six) months. 1 mL 0   HYDROcodone -acetaminophen  (NORCO/VICODIN) 5-325 MG tablet TAKE 1 OR 2 TABLETS EVERY 6 HOURS AS NEEDED FOR PAIN. 90 tablet 0   losartan  (COZAAR ) 100 MG tablet Take 1 tablet (100 mg total) by mouth daily. 90 tablet 3   nirmatrelvir/ritonavir, renal dosing, (PAXLOVID) 10 x 150 MG & 10 x 100MG  TABS Take 2 tablets by mouth 2 (two) times daily for 5 days. (Take nirmatrelvir 150 mg one tablet twice daily for 5 days and ritonavir 100 mg one tablet twice daily for 5 days) Patient GFR is 55 20 tablet 0   traZODone  (DESYREL ) 50 MG tablet TAKE 1 AND 1/2 TABLETS BY MOUTH AT BEDTIME AS NEEDED FOR SLEEP 135 tablet 1   No current facility-administered medications on file prior to visit.    Review  of Systems:  As per HPI- otherwise negative.   Physical Examination: There were no vitals filed for this visit. There were no vitals filed for this visit. There is no height or weight on file to calculate BMI. Ideal Body Weight:    GEN: no acute distress. HEENT: Atraumatic, Normocephalic.  Ears and Nose: No external deformity. CV: RRR, No M/G/R. No JVD. No thrill.  No extra heart sounds. PULM: CTA B, no wheezes, crackles, rhonchi. No retractions. No resp. distress. No accessory muscle use. ABD: S, NT, ND, +BS. No rebound. No HSM. EXTR: No c/c/e PSYCH: Normally interactive. Conversant.    Assessment and Plan: ***  Signed Gates Kasal, MD

## 2024-02-24 NOTE — Patient Instructions (Signed)
 Good to see you today, I am glad you are feeling better

## 2024-02-25 ENCOUNTER — Ambulatory Visit (INDEPENDENT_AMBULATORY_CARE_PROVIDER_SITE_OTHER): Admitting: Family Medicine

## 2024-02-25 ENCOUNTER — Encounter: Payer: Self-pay | Admitting: Family Medicine

## 2024-02-25 VITALS — BP 136/60 | HR 67 | Temp 98.1°F | Ht 63.0 in | Wt 121.0 lb

## 2024-02-25 DIAGNOSIS — R0989 Other specified symptoms and signs involving the circulatory and respiratory systems: Secondary | ICD-10-CM

## 2024-02-25 DIAGNOSIS — S83412D Sprain of medial collateral ligament of left knee, subsequent encounter: Secondary | ICD-10-CM | POA: Diagnosis not present

## 2024-03-02 DIAGNOSIS — M25562 Pain in left knee: Secondary | ICD-10-CM | POA: Diagnosis not present

## 2024-03-05 DIAGNOSIS — M25562 Pain in left knee: Secondary | ICD-10-CM | POA: Diagnosis not present

## 2024-03-19 DIAGNOSIS — M25562 Pain in left knee: Secondary | ICD-10-CM | POA: Diagnosis not present

## 2024-04-21 DIAGNOSIS — M25562 Pain in left knee: Secondary | ICD-10-CM | POA: Diagnosis not present

## 2024-04-21 DIAGNOSIS — M25572 Pain in left ankle and joints of left foot: Secondary | ICD-10-CM | POA: Diagnosis not present

## 2024-04-22 ENCOUNTER — Ambulatory Visit: Payer: Self-pay

## 2024-04-22 NOTE — Telephone Encounter (Signed)
 FYI Only or Action Required?: Action required by provider: patient requested to be scheduled for Dr.[name]'s next available appointment-patient refused to see another provider. Scheduled for May 05, 2024 per patient request-added patient to wait list.  Patient was last seen in primary care on 02/25/2024 by [name], Harlene BROCKS, MD.  Called Nurse Triage reporting Numbness.  Symptoms began several weeks ago.  Interventions attempted: Rest, hydration, or home remedies.  Symptoms are: unchanged.  Triage Disposition: See PCP When Office is Open (Within 3 Days)-patient refused to see another provider in office-only wants to see Dr. Watt.   Patient/caregiver understands and will follow disposition?: No, wishes to speak with PCP  Copied from CRM [id number]. Topic: Clinical - Red Word Triage >> Apr 22, 2024 10:59 AM Burnard DEL wrote: Red Word that prompted transfer to Nurse Triage: numbness in hands and feet Reason for Disposition  [1] Numbness or tingling in one or both hands AND [2] is a chronic symptom (recurrent or ongoing AND present > 4 weeks)  Answer Assessment - Initial Assessment Questions 1. SYMPTOM: What is the main symptom you are concerned about? (e.g., weakness, numbness)     numbness 2. ONSET: When did this start? (e.g., minutes, hours, days; while sleeping)     Numbness to hands started six weeks ago 3. LAST NORMAL: When was the last time you (the patient) were normal (no symptoms)?     Numbness in feet has been going on for awhile 4. PATTERN Does this come and go, or has it been constant since it started?  Is it present now?     constant 5. CARDIAC SYMPTOMS: Have you had any of the following symptoms: chest pain, difficulty breathing, palpitations?     no 6. NEUROLOGIC SYMPTOMS: Have you had any of the following symptoms: headache, dizziness, vision loss, double vision, changes in speech, unsteady on your feet?     Balance issues but that has been going on for  awhile 7. OTHER SYMPTOMS: Do you have any other symptoms?     no  Patient calling to report continued numbness to her feet and numbness to her fingers. Patient states numbness to her feet has been going on for awhile. Patient endorses numbness to her fingers started six weeks ago. Patient wants to speak with Dr. Watt about her Prolia  shots. Patient reports multiple joint issues have occurred since being on Prolia .   MCL tear 2 months ago Tendinitis in the ankle-patient is being seen by PT  Patient refused to be seen by another provider in the office. Patient requested to be scheduled with for her next available appointment. Patient would like a phone call back from office if she needs to be seen sooner. Patient was placed on wait list. Patient would like a phone call if she needs to be seen sooner but only wants to be seen by PCP.  Protocols used: Neurologic Deficit-A-AH

## 2024-04-26 ENCOUNTER — Telehealth: Payer: Self-pay

## 2024-04-26 NOTE — Telephone Encounter (Signed)
 Pt called with concern for her joints and weakness she has noticed since starting anastrozole /prolia . She has had many orthopaedic issues, especially concerning her joints. Today, she is c/o joint pain in her ankles, making it difficult for her to ambulate independently. We spoke extensively about the mechanism of action of prolia  and anastrozole . If joint pain is debilitating, Dr Odean recommends stopping anastrozole  for two weeks and following up with Morna Kendall, DNP to further discuss and assess if sx have subsided. Pt is reluctant to stop anastrozole  for fear that she will have acute metastatic disease. Advised pt we have not experienced this with a pt holding their medication for two weeks. She is agreeable so we can determine if anastrozole  is in fact causing her joint concerns. She has a f/u scheduled with lindsey to discuss 05/13/24 at 0920.

## 2024-04-27 ENCOUNTER — Telehealth: Payer: Self-pay | Admitting: *Deleted

## 2024-04-27 NOTE — Telephone Encounter (Signed)
 Received call from pt stating she is also experience numbness and tingling in bilateral hands and feet.  Pt states she will continue to hold Anastrozole  and f/u with our office in 2 weeks to further discuss symptoms.

## 2024-04-28 DIAGNOSIS — M25572 Pain in left ankle and joints of left foot: Secondary | ICD-10-CM | POA: Diagnosis not present

## 2024-04-28 DIAGNOSIS — M25562 Pain in left knee: Secondary | ICD-10-CM | POA: Diagnosis not present

## 2024-05-02 NOTE — Patient Instructions (Incomplete)
 It was great to see you again today- we will start with labs today and see if we can turn up any explanation for your symptoms

## 2024-05-02 NOTE — Progress Notes (Unsigned)
 [facility] [address], [address] [place], [place] [address] [phone]  Date:  05/05/2024   Name:  Dawn Powers   DOB:  [birth year]   MRN:  [id number]  PCP:  Watt Harlene BROCKS, MD    Chief Complaint: numbness hands/fingers/feet, trigger finger right ring finger, oncologsit took her off of anastrozole , tears on left and right knee/ tendonitis left ankle, blood work? is it due, and 4 months of no back spasms and no hydrocodone    History of Present Illness:  Dawn Powers is a 78 y.o. very pleasant female patient who presents with the following:  Patient seen today for follow-up.  She has several concerns including  of numbness in her fingers and hands.  Most recent visit with me was in May.  Dawn Powers has history of breast cancer, endometrial cancer, hypertension, osteopenia   She takes amlodipine  5 and losartan  100 for her blood pressure  She notes she hurt her knee and ankle over the last few months and [facility] is helping her with this  Her oncologist told her ok to stop anastrozole  and see if this may help with all of these joint issues She is having to use a cane right now due to knee and ankle pain She has been on anastrazole3.5 years out of 5 year goal.  She is not sure if she will consider continuing this treatment or trying something else.  I advised her I need to defer to the advice of her oncologist here  She notes her right sided trigger finger is still bothering her a lot- she may go ahead and have ortho treat this.  She previously saw Dr.Xu but did not want to have an injection at that time  We can update her DEXA scan this fall She has completed Shingrix ?  Has she done RSV yet  She notes the tips of the fingers on both hands have been tingling It might be a little worse on the left side She has noticed this for a month.  It is always there but will wax and wane She notes she has dropped a couple of things but  this did not seem to be out of the ordinary.  She does not think it is due to the tingling in her fingers No pain- just numbness Tapping the fingers together seems to hep relieve the symptoms No pattern as far as time of day or activities that make it worse  She notes her back however seems to be much better -previously she suffered with back pain for many years, this actually seems to be better now.  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/2020   Prediabetes 07/02/2019   S/P shoulder replacement, 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   Endometrial cancer (HCC) 09/26/2015   Essential hypertension 05/23/2015   Thoracic facet joint syndrome 04/27/2014   Back pain 03/24/2014   Muscle spasm 03/24/2014   Idiopathic scoliosis and kyphoscoliosis 12/22/2012   Osteopenia 12/22/2012   Other and unspecified hyperlipidemia 12/22/2012    Past Medical History:  Diagnosis Date   Anxiety    Arthritis    Breast cancer (HCC) 09/19/2020   left breast [facility] w/ radiation no chemo   Cancer [facility])    Endometrial    Family history of breast cancer    Family history of kidney cancer    Family history of prostate cancer    Heart murmur    Hyperlipidemia    Hypertension    Neuromuscular disorder (HCC)    DDD   Osteoporosis 06/12/2021   Pain    lower back -hx DDD   Personal history of radiation therapy    left [facility] 08/2020 w/ radiation no chemo   Radiation 11/15/15, 11/23/15, 11/30/15, 12/07/15, 12/14/15   HDR brachytherapy    Past Surgical History:  Procedure Laterality Date   ABDOMINAL HYSTERECTOMY     BREAST LUMPECTOMY Left 10/19/2020   left  lumpectomy 10-19-20   BREAST LUMPECTOMY WITH RADIOACTIVE SEED AND SENTINEL LYMPH NODE BIOPSY Left 10/19/2020   Procedure: LEFT BREAST LUMPECTOMY WITH RADIOACTIVE SEED AND SENTINEL LYMPH NODE BIOPSY;  Surgeon: Aron Shoulders, MD;  Location: [facility];  Service: General;  Laterality: Left;  PEC BLOCK, RNFA   CATARACT EXTRACTION Bilateral    COLONOSCOPY     FRACTURE SURGERY Right    ankle   GANGLION CYST EXCISION     LYMPH NODE BIOPSY N/A 09/26/2015   Procedure: SENTINEL LYMPH NODE BIOPSY;  Surgeon: Maurilio Ship, MD;  Location: [facility];  Service: Gynecology;  Laterality: N/A;   ROBOTIC ASSISTED TOTAL HYSTERECTOMY WITH BILATERAL SALPINGO OOPHERECTOMY Bilateral 09/26/2015   Procedure: ROBOTIC ASSISTED TOTAL HYSTERECTOMY WITH BILATERAL SALPINGO OOPHORECTOMY;  Surgeon: Maurilio Ship, MD;  Location: [facility];  Service: Gynecology;  Laterality: Bilateral;   TOTAL SHOULDER ARTHROPLASTY Left 09/10/2018   Procedure: TOTAL SHOULDER ARTHROPLASTY;  Surgeon: Melita Drivers, MD;  Location: [facility];  Service: Orthopedics;  Laterality: Left;    WISDOM TOOTH EXTRACTION     WISDOM TOOTH EXTRACTION      Social History   Tobacco Use   Smoking status: Former    Current packs/day: 0.00    Types: Cigarettes    Start date: 09/30/1994    Quit date: 09/30/1998    Years since quitting: 25.6   Smokeless tobacco: Never  Vaping Use   Vaping status: Never Used  Substance Use Topics   Alcohol use: Yes    Alcohol/week: 3.0 standard drinks of alcohol    Types: 3 Standard drinks or equivalent per week    Comment: 1 drink 3 times per week   Drug use: No    Family History  Problem Relation Age of Onset   Heart disease Mother    Alcohol abuse Mother        breast cancer - questionable    Dementia Mother    Alcohol abuse Father    Prostate cancer Father 8       prostate and colon- unsure of primary    Kidney cancer Brother 48   Alcohol abuse Maternal Grandfather    Alcohol abuse Sister    Basal cell  carcinoma Sister    Breast cancer Cousin        double mastectomy, d. 86   Prostate cancer Brother 46   Esophageal cancer Neg Hx    Stomach cancer Neg Hx    Rectal cancer Neg Hx     Allergies  Allergen Reactions   Morphine Itching    Medication list has been reviewed and updated.  Current Outpatient Medications on  File Prior to Visit  Medication Sig Dispense Refill   amLODipine  (NORVASC ) 5 MG tablet Take 1 tablet (5 mg total) by mouth daily. 90 tablet 3   cholecalciferol (VITAMIN D3) 25 MCG (1000 UNIT) tablet Take 2 tablets (2,000 Units total) by mouth daily.     Cranberry 250 MG CAPS Take 1 capsule by mouth daily.     Cyanocobalamin  (VITAMIN B 12) 500 MCG TABS Take by mouth.     denosumab  (PROLIA ) 60 MG/ML SOSY injection Inject 60 mg into the skin every 6 (six) months. 1 mL 0   HYDROcodone -acetaminophen  (NORCO/VICODIN) 5-325 MG tablet TAKE 1 OR 2 TABLETS EVERY 6 HOURS AS NEEDED FOR PAIN. 90 tablet 0   losartan  (COZAAR ) 100 MG tablet Take 1 tablet (100 mg total) by mouth daily. 90 tablet 3   traZODone  (DESYREL ) 50 MG tablet TAKE 1 AND 1/2 TABLETS BY MOUTH AT BEDTIME AS NEEDED FOR SLEEP 135 tablet 1   anastrozole  (ARIMIDEX ) 1 MG tablet Take 1 tablet (1 mg total) by mouth daily. (Patient not taking: Reported on 05/05/2024) 90 tablet 4   No current facility-administered medications on file prior to visit.    Review of Systems:  As per HPI- otherwise negative.   Physical Examination: Vitals:   05/05/24 1309  BP: 130/66  Pulse: 60  Resp: 12  Temp: 98 F (36.7 C)  SpO2: 98%   Vitals:   05/05/24 1309  Weight: 123 lb (55.8 kg)  Height: 5' 3 (1.6 m)   Body mass index is 21.79 kg/m. Ideal Body Weight: Weight in (lb) to have BMI = 25: 140.8  GEN: no acute distress. Slim build, looks well  HEENT: Atraumatic, Normocephalic.  Ears and Nose: No external deformity. CV: RRR, No M/G/R. No JVD. No thrill. No extra heart sounds. PULM: CTA B, no wheezes, crackles, rhonchi. No  retractions. No resp. distress. No accessory muscle use. ABD: S, NT, ND, +BS. No rebound. No HSM. EXTR: No c/c/e PSYCH: Normally interactive. Conversant.  Bilateral upper extremity strength and deep tendon reflexes normal.  Grip strength, hand and wrist function is normal.  Hands are warm well-perfused with strong pulses.  Patient is able to perform rapidly alternating movements of her hands and touch all of her fingers and turn without difficulty  Assessment and Plan: Numbness and tingling in both hands - Plan: Vitamin B12, Folate, Ferritin, TSH  Screening for diabetes mellitus - Plan: Hemoglobin A1c  Abnormal CBC - Plan: Ferritin  Vitamin D  deficiency - Plan: VITAMIN D  25 Hydroxy (Vit-D Deficiency, Fractures)  Patient seen today with a few concerns. Main issue today is numbness and tingling in her hands-we will obtain blood work as above and attempt to find explanation.  If all of her blood work is normal consider imaging her neck She is seeing orthopedics for her orthopedic concerns right now.  She has close follow-up with her oncologist to discuss whether or not she will continue anastrozole  Signed Harlene Schroeder, MD  Received her labs 8/7- message to pt  Results for orders placed or performed in visit on 05/05/24  Vitamin B12   Collection Time: 05/05/24  1:40 PM  Result Value Ref Range   Vitamin B-12 607 211 - 911 pg/mL  Folate   Collection Time: 05/05/24  1:40 PM  Result Value Ref Range   Folate 16.2 >5.9 ng/mL  Ferritin   Collection Time: 05/05/24  1:40 PM  Result Value Ref Range   Ferritin 61.3 10.0 - 291.0 ng/mL  TSH  Collection Time: 05/05/24  1:40 PM  Result Value Ref Range   TSH 1.90 0.35 - 5.50 uIU/mL  Hemoglobin A1c   Collection Time: 05/05/24  1:40 PM  Result Value Ref Range   Hgb A1c MFr Bld 5.8 4.6 - 6.5 %  VITAMIN D  25 Hydroxy (Vit-D Deficiency, Fractures)   Collection Time: 05/05/24  1:40 PM  Result Value Ref Range   VITD 35.62 30.00 - 100.00 ng/mL

## 2024-05-05 ENCOUNTER — Ambulatory Visit (INDEPENDENT_AMBULATORY_CARE_PROVIDER_SITE_OTHER): Admitting: Family Medicine

## 2024-05-05 VITALS — BP 130/66 | HR 60 | Temp 98.0°F | Resp 12 | Ht 63.0 in | Wt 123.0 lb

## 2024-05-05 DIAGNOSIS — M25572 Pain in left ankle and joints of left foot: Secondary | ICD-10-CM | POA: Diagnosis not present

## 2024-05-05 DIAGNOSIS — R7989 Other specified abnormal findings of blood chemistry: Secondary | ICD-10-CM | POA: Diagnosis not present

## 2024-05-05 DIAGNOSIS — Z131 Encounter for screening for diabetes mellitus: Secondary | ICD-10-CM | POA: Diagnosis not present

## 2024-05-05 DIAGNOSIS — M25562 Pain in left knee: Secondary | ICD-10-CM | POA: Diagnosis not present

## 2024-05-05 DIAGNOSIS — R202 Paresthesia of skin: Secondary | ICD-10-CM

## 2024-05-05 DIAGNOSIS — R200 Anesthesia of skin: Secondary | ICD-10-CM | POA: Diagnosis not present

## 2024-05-05 DIAGNOSIS — E559 Vitamin D deficiency, unspecified: Secondary | ICD-10-CM | POA: Diagnosis not present

## 2024-05-06 ENCOUNTER — Encounter: Payer: Self-pay | Admitting: Family Medicine

## 2024-05-06 LAB — FERRITIN: Ferritin: 61.3 ng/mL (ref 10.0–291.0)

## 2024-05-06 LAB — VITAMIN B12: Vitamin B-12: 607 pg/mL (ref 211–911)

## 2024-05-06 LAB — FOLATE: Folate: 16.2 ng/mL (ref >5.9)

## 2024-05-06 LAB — HEMOGLOBIN A1C: Hgb A1c MFr Bld: 5.8 % (ref 4.6–6.5)

## 2024-05-06 LAB — TSH: TSH: 1.9 u[IU]/mL (ref 0.35–5.50)

## 2024-05-06 LAB — VITAMIN D 25 HYDROXY (VIT D DEFICIENCY, FRACTURES): VITD: 35.62 ng/mL (ref 30.00–100.00)

## 2024-05-10 ENCOUNTER — Ambulatory Visit

## 2024-05-12 DIAGNOSIS — H18453 Nodular corneal degeneration, bilateral: Secondary | ICD-10-CM | POA: Diagnosis not present

## 2024-05-12 DIAGNOSIS — H10413 Chronic giant papillary conjunctivitis, bilateral: Secondary | ICD-10-CM | POA: Diagnosis not present

## 2024-05-12 DIAGNOSIS — H04123 Dry eye syndrome of bilateral lacrimal glands: Secondary | ICD-10-CM | POA: Diagnosis not present

## 2024-05-12 DIAGNOSIS — M25562 Pain in left knee: Secondary | ICD-10-CM | POA: Diagnosis not present

## 2024-05-12 DIAGNOSIS — H26492 Other secondary cataract, left eye: Secondary | ICD-10-CM | POA: Diagnosis not present

## 2024-05-12 DIAGNOSIS — H16141 Punctate keratitis, right eye: Secondary | ICD-10-CM | POA: Diagnosis not present

## 2024-05-12 DIAGNOSIS — M25572 Pain in left ankle and joints of left foot: Secondary | ICD-10-CM | POA: Diagnosis not present

## 2024-05-13 ENCOUNTER — Telehealth: Payer: Self-pay

## 2024-05-13 ENCOUNTER — Encounter: Payer: Self-pay | Admitting: Adult Health

## 2024-05-13 ENCOUNTER — Inpatient Hospital Stay: Attending: Adult Health | Admitting: Adult Health

## 2024-05-13 ENCOUNTER — Other Ambulatory Visit: Payer: Self-pay

## 2024-05-13 VITALS — BP 145/54 | HR 60 | Temp 97.1°F | Resp 19 | Ht 63.0 in | Wt 125.5 lb

## 2024-05-13 DIAGNOSIS — C50412 Malignant neoplasm of upper-outer quadrant of left female breast: Secondary | ICD-10-CM | POA: Insufficient documentation

## 2024-05-13 DIAGNOSIS — I10 Essential (primary) hypertension: Secondary | ICD-10-CM | POA: Diagnosis not present

## 2024-05-13 DIAGNOSIS — Z79811 Long term (current) use of aromatase inhibitors: Secondary | ICD-10-CM | POA: Insufficient documentation

## 2024-05-13 DIAGNOSIS — Z803 Family history of malignant neoplasm of breast: Secondary | ICD-10-CM | POA: Insufficient documentation

## 2024-05-13 DIAGNOSIS — Z79899 Other long term (current) drug therapy: Secondary | ICD-10-CM | POA: Insufficient documentation

## 2024-05-13 DIAGNOSIS — Z8542 Personal history of malignant neoplasm of other parts of uterus: Secondary | ICD-10-CM | POA: Insufficient documentation

## 2024-05-13 DIAGNOSIS — E538 Deficiency of other specified B group vitamins: Secondary | ICD-10-CM | POA: Diagnosis not present

## 2024-05-13 DIAGNOSIS — M810 Age-related osteoporosis without current pathological fracture: Secondary | ICD-10-CM | POA: Diagnosis not present

## 2024-05-13 DIAGNOSIS — Z170 Estrogen receptor positive status [ER+]: Secondary | ICD-10-CM | POA: Insufficient documentation

## 2024-05-13 DIAGNOSIS — Z8051 Family history of malignant neoplasm of kidney: Secondary | ICD-10-CM | POA: Insufficient documentation

## 2024-05-13 DIAGNOSIS — Z87891 Personal history of nicotine dependence: Secondary | ICD-10-CM | POA: Insufficient documentation

## 2024-05-13 DIAGNOSIS — G629 Polyneuropathy, unspecified: Secondary | ICD-10-CM | POA: Diagnosis not present

## 2024-05-13 NOTE — Telephone Encounter (Signed)
 Orders entered for Guardant Reveal per NP. Requisition and all supporting documents given to [facility] lab for lab to be drawn 07/29/24. Pt is aware and verbalized thanks.

## 2024-05-13 NOTE — Assessment & Plan Note (Signed)
 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 Anastrozole  discontinued due to joint issues and minimal benefit. Discussed increased recurrence risk post-anastrozole , quality of life, and alternative therapies with similar side effects. Discussed Guardant Reveal for intensified screening. - Order Guardant Reveal blood test in October for circulating tumor DNA. - Continue routine mammograms as scheduled. - Patient opted to discontinue anti-estrogen therapy  Osteoporosis on Prolia  Osteoporosis managed with Prolia , improving chronic back pain. Bone density scan due in November. Discussed Prolia 's impact on bone health and pain relief. - Continue Prolia  every six months. - Schedule bone density scan at [facility] or [place] in November.  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 Neck x-ray suggested by primary care, but she declined. - Monitor symptoms and consider neck x-ray if symptoms worsen.  RTC as scheduled for labs and prolia  in October and labs, f/u with Dr. Gudena and Prolia  in April, 2026.

## 2024-05-13 NOTE — Progress Notes (Signed)
 [facility] Cancer Follow up:    [name], [name], MD [address] [place] [address]   DIAGNOSIS:  Cancer Staging  Endometrial cancer [facility]) Staging form: Corpus Uteri - Carcinoma, AJCC 7th Edition - Clinical: FIGO Stage IB (T1b, N0, M0) - Signed by Layla Sandria BROCKS, MD on 09/21/2020 Histologic grade (G): G2  Malignant neoplasm of upper-outer quadrant of left breast in female, estrogen receptor positive (HCC) Staging form: Breast, AJCC 8th Edition - Clinical: Stage IA (cT1c, cN0, cM0, G3, ER+, PR+, HER2: Equivocal) - Signed by Layla Sandria BROCKS, MD on 09/21/2020    SUMMARY OF ONCOLOGIC HISTORY:   CURRENT THERAPY:    INTERVAL HISTORY:  Discussed the use of AI scribe software for clinical note transcription with the patient, who gave verbal consent to proceed.  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 She underwent a left lumpectomy followed by adjuvant radiation and anti-estrogen therapy with anastrozole . She stopped anastrozole  earlier this month due to side effects and is seeking alternative anti-estrogen therapy options.  She experiences joint issues, including knees and ankle instability, initially attributed to Prolia . Her knee instability led to a cortisone injection and use of a brace for an MCL issue. Her ankle instability improved after stopping anastrozole , allowing her to walk without crutches. She has osteoporosis and receives Prolia  injections every six months, with significant improvement in chronic back pain and no recent need for hydrocodone .  She experiences trigger finger and peripheral neuropathy in her feet and hands, affecting her ability to play music. She experiences  numbness in her finger but is not pursuing further investigation at this time. She remains active, participating in exercise classes and planning trips, emphasizing her desire to stay mobile despite joint issues.     Patient Active Problem List   Diagnosis Date Noted   Osteoporosis 06/12/2021   Genetic testing 12/18/2020   Family history of prostate cancer    Family history of breast cancer    Family history of kidney cancer    Malignant neoplasm of upper-outer quadrant of left breast in female, 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/21/2020   Prediabetes 07/02/2019   S/P shoulder replacement, 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   Endometrial cancer (HCC) 09/26/2015   Essential hypertension 05/23/2015   Thoracic facet joint syndrome 04/27/2014   Back pain 03/24/2014   Muscle spasm 03/24/2014   Idiopathic scoliosis and kyphoscoliosis 12/22/2012   Osteopenia 12/22/2012   Other and unspecified hyperlipidemia 12/22/2012    is allergic to morphine.  MEDICAL HISTORY: Past Medical History:  Diagnosis Date   Anxiety    Arthritis    Breast cancer (HCC) 09/19/2020   left breast [facility] w/ radiation no chemo   Cancer [facility])    Endometrial    Family history of breast cancer    Family history of kidney cancer    Family history of prostate cancer    Heart murmur    Hyperlipidemia    Hypertension    Neuromuscular disorder (HCC)    DDD   Osteoporosis 06/12/2021   Pain    lower back -hx DDD   Personal history of radiation therapy    left [facility] 08/2020 w/ radiation no chemo   Radiation 11/15/15, 11/23/15, 11/30/15, 12/07/15, 12/14/15   HDR brachytherapy  SURGICAL HISTORY: Past Surgical History:  Procedure Laterality Date   ABDOMINAL HYSTERECTOMY     BREAST LUMPECTOMY Left 10/19/2020   left lumpectomy 10-19-20   BREAST LUMPECTOMY WITH RADIOACTIVE SEED AND SENTINEL LYMPH NODE BIOPSY Left 10/19/2020   Procedure: LEFT BREAST LUMPECTOMY WITH RADIOACTIVE SEED AND SENTINEL LYMPH NODE BIOPSY;  Surgeon: Aron Shoulders,  MD;  Location: [facility];  Service: General;  Laterality: Left;  PEC BLOCK, RNFA   CATARACT EXTRACTION Bilateral    COLONOSCOPY     FRACTURE SURGERY Right    ankle   GANGLION CYST EXCISION     LYMPH NODE BIOPSY N/A 09/26/2015   Procedure: SENTINEL LYMPH NODE BIOPSY;  Surgeon: Maurilio Ship, MD;  Location: [facility];  Service: Gynecology;  Laterality: N/A;   ROBOTIC ASSISTED TOTAL HYSTERECTOMY WITH BILATERAL SALPINGO OOPHERECTOMY Bilateral 09/26/2015   Procedure: ROBOTIC ASSISTED TOTAL HYSTERECTOMY WITH BILATERAL SALPINGO OOPHORECTOMY;  Surgeon: Maurilio Ship, MD;  Location: [facility];  Service: Gynecology;  Laterality: Bilateral;   TOTAL SHOULDER ARTHROPLASTY Left 09/10/2018   Procedure: TOTAL SHOULDER ARTHROPLASTY;  Surgeon: Melita Drivers, MD;  Location: [facility];  Service: Orthopedics;  Laterality: Left;    WISDOM TOOTH EXTRACTION     WISDOM TOOTH EXTRACTION      SOCIAL HISTORY: Social History   Socioeconomic History   Marital status: Married    Spouse name: Curtistine   Number of children: 2   Years of education: Not on file   Highest education level: Not on file  Occupational History   Occupation: retired Manufacturing engineer  Tobacco Use   Smoking status: Former    Current packs/day: 0.00    Types: Cigarettes    Start date: 09/30/1994    Quit date: 09/30/1998    Years since quitting: 25.6   Smokeless tobacco: Never  Vaping Use   Vaping status: Never Used  Substance and Sexual Activity   Alcohol use: Yes    Alcohol/week: 3.0 standard drinks of alcohol    Types: 3 Standard drinks or equivalent per week    Comment: 1 drink 3 times per week   Drug use: No   Sexual activity: Yes    Partners: Female    Birth control/protection: Surgical  Other Topics Concern   Not on file  Social History Narrative   Married. Education: college and Other. Exercise 4 times a week for 50 minutes.   Social Drivers of Corporate investment banker Strain: Low Risk  (04/22/2023)   Overall  Financial Resource Strain (CARDIA)    Difficulty of Paying Living Expenses: Not hard at all  Food Insecurity: No Food Insecurity (04/22/2023)   Hunger Vital Sign    Worried About Running Out of Food in the Last Year: Never true    Ran Out of Food in the Last Year: Never true  Transportation Needs: No Transportation Needs (04/22/2023)   PRAPARE - Administrator, Civil Service (Medical): No    Lack of Transportation (Non-Medical): No  Physical Activity: Sufficiently Active (04/22/2023)   Exercise Vital Sign    Days of Exercise per Week: 7 days    Minutes of Exercise per Session: 30 min  Stress: Stress Concern Present (04/22/2023)   [employer] of Occupational Health - Occupational Stress Questionnaire    Feeling of Stress : Rather much  Social Connections: Socially Integrated (04/22/2023)   Social Connection and Isolation Panel    Frequency of Communication with Friends and Family: More than three times a week  Frequency of Social Gatherings with Friends and Family: Once a week    Attends Religious Services: More than 4 times per year    Active Member of [employer] or Organizations: Yes    Attends Engineer, structural: More than 4 times per year    Marital Status: Married  Catering manager Violence: Not At Risk (04/22/2023)   Humiliation, Afraid, Rape, and Kick questionnaire    Fear of Current or Ex-Partner: No    Emotionally Abused: No    Physically Abused: No    Sexually Abused: No    FAMILY HISTORY: Family History  Problem Relation Age of Onset   Heart disease Mother    Alcohol abuse Mother        breast cancer - questionable    Dementia Mother    Alcohol abuse Father    Prostate cancer Father 17       prostate and colon- unsure of primary    Kidney cancer Brother 84   Alcohol abuse Maternal Grandfather    Alcohol abuse Sister    Basal cell carcinoma Sister    Breast cancer Cousin        double mastectomy, d. 9   Prostate cancer Brother 26    Esophageal cancer Neg Hx    Stomach cancer Neg Hx    Rectal cancer Neg Hx     Review of Systems  Constitutional:  Negative for appetite change, chills, fatigue, fever and unexpected weight change.  HENT:   Negative for hearing loss, lump/mass and trouble swallowing.   Eyes:  Negative for eye problems and icterus.  Respiratory:  Negative for chest tightness, cough and shortness of breath.   Cardiovascular:  Negative for chest pain, leg swelling and palpitations.  Gastrointestinal:  Negative for abdominal distention, abdominal pain, constipation, diarrhea, nausea and vomiting.  Endocrine: Negative for hot flashes.  Genitourinary:  Negative for difficulty urinating.   Musculoskeletal:  Negative for arthralgias.  Skin:  Negative for itching and rash.  Neurological:  Negative for dizziness, extremity weakness, headaches and numbness.  Hematological:  Negative for adenopathy. Does not bruise/bleed easily.  Psychiatric/Behavioral:  Negative for depression. The patient is not nervous/anxious.       PHYSICAL EXAMINATION   Vitals:   05/13/24 0934 05/13/24 0946  BP: (!) 166/54 (!) 145/54  Pulse: 60   Resp: 19   Temp: (!) 97.1 F (36.2 C)   SpO2: 100%     Physical Exam Constitutional:      General: She is not in acute distress.    Appearance: Normal appearance. She is not toxic-appearing.  HENT:     Head: Normocephalic and atraumatic.  Eyes:     General: No scleral icterus. Musculoskeletal:        General: No swelling.  Skin:    General: Skin is warm and dry.     Findings: No rash.  Neurological:     General: No focal deficit present.     Mental Status: She is alert.  Psychiatric:        Mood and Affect: Mood normal.        Behavior: Behavior normal.     ASSESSMENT and THERAPY PLAN:   Malignant neoplasm of upper-outer quadrant of left breast in female, 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/19/2020: Right breast biopsy: T1CN0 stage Ia grade 3 IDC ER/PR positive, HER2  equal vocal, FISH negative, Ki-67 10% (genetics negative) 10/19/2020: Left lumpectomy: Grade 2 IDC negative margins (Oncotype DX: Score 15: 4% ROR) 12/20/2020-01/10/2021: Adjuvant radiation  Current treatment: Anastrozole  started neoadjuvantly 08/2020-2025  Assessment and Plan Assessment & Plan ER/PR positive right breast invasive ductal carcinoma, status post lumpectomy, radiation, and antiestrogen therapy Stage 1A ER/PR positive right breast invasive ductal carcinoma diagnosed in December 2021. Anastrozole  discontinued due to joint issues and minimal benefit. Discussed increased recurrence risk post-anastrozole , quality of life, and alternative therapies with similar side effects. Discussed Guardant Reveal for intensified screening. - Order Guardant Reveal blood test in October for circulating tumor DNA. - Continue routine mammograms as scheduled. - Patient opted to discontinue anti-estrogen therapy  Osteoporosis on Prolia  Osteoporosis managed with Prolia , improving chronic back pain. Bone density scan due in November. Discussed Prolia 's impact on bone health and pain relief. - Continue Prolia  every six months. - Schedule [facility] density [facility] or [facility] in November.  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 Neck x-ray suggested by primary care, but she declined. - Monitor symptoms and consider neck x-ray if symptoms worsen.  RTC as scheduled for labs and prolia  in October and labs, f/u with Dr. Gudena and Prolia  in April, 2026.      All questions were answered. The patient knows to call the clinic with any problems, questions or concerns. We can certainly see the patient much sooner if necessary.  Total encounter time:30 minutes*in face-to-face visit time, chart review, lab review, care coordination, order entry, and documentation of the encounter time.    Morna Kendall, NP 05/13/24 12:35 PM Medical  Oncology and Hematology [facility] [address] [place], [place] [address] Tel. [phone]    Fax. [phone]  *Total Encounter Time as defined by the Centers for Medicare and Medicaid Services includes, in addition to the face-to-face time of a patient visit (documented in the note above) non-face-to-face time: obtaining and reviewing outside history, ordering and reviewing medications, tests or procedures, care coordination (communications with other health care professionals or caregivers) and documentation in the medical record.

## 2024-06-07 ENCOUNTER — Ambulatory Visit (INDEPENDENT_AMBULATORY_CARE_PROVIDER_SITE_OTHER): Admitting: Family

## 2024-06-07 ENCOUNTER — Ambulatory Visit: Payer: Self-pay

## 2024-06-07 VITALS — BP 147/50 | HR 58 | Temp 97.8°F | Resp 16 | Ht 63.0 in | Wt 123.0 lb

## 2024-06-07 DIAGNOSIS — J329 Chronic sinusitis, unspecified: Secondary | ICD-10-CM | POA: Diagnosis not present

## 2024-06-07 MED ORDER — AMOXICILLIN-POT CLAVULANATE 875-125 MG PO TABS: 1.0000 | ORAL_TABLET | Freq: Two times a day (BID) | ORAL | 0 refills | Status: DC

## 2024-06-07 NOTE — Telephone Encounter (Signed)
 Appt today

## 2024-06-07 NOTE — Patient Instructions (Signed)
 VISIT SUMMARY:  You visited us  today due to a persistent cough and nasal congestion that started after your trip to [place] . You tested negative for COVID-19 and have been experiencing some relief with hot showers and Mucinex DM, but symptoms persist.  YOUR PLAN:  ACUTE SINUSITIS WITH UPPER RESPIRATORY SYMPTOMS: Your symptoms are consistent with sinusitis, but you do not have a fever or purulent discharge. Your lungs are clear, and there is no sign of pneumonia. -We have prescribed Augmentin  for you to pick up at [facility]  -Use nasal saline spray or a Medipod for relief. -Monitor your symptoms and start the antibiotics if there is no improvement in two days. -Contact our office if your symptoms persist after three to four days of antibiotic use.

## 2024-06-07 NOTE — Telephone Encounter (Signed)
 FYI Only or Action Required?: FYI only for provider.  Patient was last seen in primary care on 05/05/2024 by [name], Harlene BROCKS, MD.  Called Nurse Triage reporting Cough.  Symptoms began a week ago.  Interventions attempted: OTC medications: mucinex.  Symptoms are: unchanged.  Triage Disposition: See PCP When Office is Open (Within 3 Days)  Patient/caregiver understands and will follow disposition?: Yes, will follow disposition  Copied from CRM [id number]. Topic: Clinical - Red Word Triage >> Jun 07, 2024 10:04 AM Adelita E wrote: [facility] that prompted transfer to Nurse Triage: Congestion and worsening cough going on for over 1 week. Cough producing yellow mucous. Reason for Disposition  Cough has been present for > 3 weeks  Answer Assessment - Initial Assessment Questions 1. ONSET: When did the cough begin?      Over a week ago 2. SEVERITY: How bad is the cough today?      Same as it has been, states that it is a juicy chest cough 3. SPUTUM: Describe the color of your sputum (e.g., none, dry cough; clear, white, yellow, green)     yellow 4. HEMOPTYSIS: Are you coughing up any blood? If Yes, ask: How much? (e.g., flecks, streaks, tablespoons, etc.)     denies 5. DIFFICULTY BREATHING: Are you having difficulty breathing? If Yes, ask: How bad is it? (e.g., mild, moderate, severe)      denies 6. FEVER: Do you have a fever? If Yes, ask: What is your temperature, how was it measured, and when did it start?     denies 7. CARDIAC HISTORY: Do you have any history of heart disease? (e.g., heart attack, congestive heart failure)      Heart murmur 8. LUNG HISTORY: Do you have any history of lung disease?  (e.g., pulmonary embolus, asthma, emphysema)     denies 9. PE RISK FACTORS: Do you have a history of blood clots? (or: recent major surgery, recent prolonged travel, bedridden)     denies 10. OTHER SYMPTOMS: Do you have any other symptoms? (e.g., runny nose,  wheezing, chest pain)       Head congestion 12. TRAVEL: Have you traveled out of the country in the last month? (e.g., travel history, exposures)       denies  Protocols used: Cough - Acute Productive-A-AH

## 2024-06-07 NOTE — Assessment & Plan Note (Addendum)
  Symptoms consistent with sinusitis, no fever or purulent discharge. - Prescribed Augmentin  for pickup at [facility] if symptoms persist beyond 10 days.  - Advised nasal saline spray or Netipot for relief.  - Advised to contact office if symptoms persist after three to four days of antibiotic use.

## 2024-06-07 NOTE — Progress Notes (Signed)
 Subjective:     Patient ID: Dawn Powers, female    DOB: 14-May-1946, 78 y.o.   MRN: [id number]  Chief Complaint  Patient presents with   Nasal Congestion    Patient reports nasal congestion for about 10 days   Cough    Patient complains of productive cough for about 10 days    Cough    Discussed the use of AI scribe software for clinical note transcription with the patient, who gave verbal consent to proceed.  History of Present Illness  Dawn Powers is an 78 year old female who presents with persistent cough and nasal congestion. Her symptoms began after returning from a trip to [place]  a week ago Saturday, including nasal congestion and a persistent cough with clear mucus. She has tested negative for COVID-19 twice. Symptoms improve slightly after a hot shower but return shortly after. She experienced a single episode of wheezing midweek last week, which is unusual for her. Despite congestion and cough, she maintains normal breathing. She has not used nasal sprays or antihistamines recently. She completed a four-day course of Mucinex DM with slight improvement, but symptoms persist. She has not used fluticasone  due to an expired supply. Initially, nighttime symptoms were difficult, but they have improved. She continues to wake up with symptoms each morning.      Health Maintenance Due  Topic Date Due   Medicare Annual Wellness (AWV)  04/21/2024   Influenza Vaccine  04/30/2024   COVID-19 Vaccine (7 - 2025-26 season) 05/31/2024    Past Medical History:  Diagnosis Date   Anxiety    Arthritis    Breast cancer (HCC) 09/19/2020   left breast [facility] w/ radiation no chemo   Cancer (HCC)    Endometrial    Family history of breast cancer    Family history of kidney cancer    Family history of prostate cancer    Heart murmur    Hyperlipidemia    Hypertension    Neuromuscular disorder (HCC)    DDD   Osteoporosis 06/12/2021   Pain    lower back -hx DDD   Personal history of  radiation therapy    left [facility] 08/2020 w/ radiation no chemo   Radiation 11/15/15, 11/23/15, 11/30/15, 12/07/15, 12/14/15   HDR brachytherapy    Past Surgical History:  Procedure Laterality Date   ABDOMINAL HYSTERECTOMY     BREAST LUMPECTOMY Left 10/19/2020   left lumpectomy 10-19-20   BREAST LUMPECTOMY WITH RADIOACTIVE SEED AND SENTINEL LYMPH NODE BIOPSY Left 10/19/2020   Procedure: LEFT BREAST LUMPECTOMY WITH RADIOACTIVE SEED AND SENTINEL LYMPH NODE BIOPSY;  Surgeon: Aron Shoulders, MD;  Location: [facility];  Service: General;  Laterality: Left;  PEC BLOCK, RNFA   CATARACT EXTRACTION Bilateral    COLONOSCOPY     FRACTURE SURGERY Right    ankle   GANGLION CYST EXCISION     LYMPH NODE BIOPSY N/A 09/26/2015   Procedure: SENTINEL LYMPH NODE BIOPSY;  Surgeon: Maurilio Ship, MD;  Location: [facility];  Service: Gynecology;  Laterality: N/A;   ROBOTIC ASSISTED TOTAL HYSTERECTOMY WITH BILATERAL SALPINGO OOPHERECTOMY Bilateral 09/26/2015   Procedure: ROBOTIC ASSISTED TOTAL HYSTERECTOMY WITH BILATERAL SALPINGO OOPHORECTOMY;  Surgeon: Maurilio Ship, MD;  Location: [facility];  Service: Gynecology;  Laterality: Bilateral;   TOTAL SHOULDER ARTHROPLASTY Left 09/10/2018   Procedure: TOTAL SHOULDER ARTHROPLASTY;  Surgeon: Melita Drivers, MD;  Location: [facility];  Service: Orthopedics;  Laterality: Left;    WISDOM TOOTH EXTRACTION  WISDOM TOOTH EXTRACTION      Family History  Problem Relation Age of Onset   Heart disease Mother    Alcohol abuse Mother        breast cancer - questionable    Dementia Mother    Alcohol abuse Father    Prostate cancer Father 36       prostate and colon- unsure of primary    Kidney cancer Brother 78   Alcohol abuse Maternal Grandfather    Alcohol abuse Sister    Basal cell carcinoma Sister    Breast cancer Cousin        double mastectomy, d. 17   Prostate cancer Brother 4   Esophageal cancer Neg Hx    Stomach cancer Neg Hx    Rectal cancer Neg Hx      Social History   Socioeconomic History   Marital status: Married    Spouse name: Curtistine   Number of children: 2   Years of education: Not on file   Highest education level: Not on file  Occupational History   Occupation: retired Manufacturing engineer  Tobacco Use   Smoking status: Former    Current packs/day: 0.00    Types: Cigarettes    Start date: 09/30/1994    Quit date: 09/30/1998    Years since quitting: 25.7   Smokeless tobacco: Never  Vaping Use   Vaping status: Never Used  Substance and Sexual Activity   Alcohol use: Yes    Alcohol/week: 3.0 standard drinks of alcohol    Types: 3 Standard drinks or equivalent per week    Comment: 1 drink 3 times per week   Drug use: No   Sexual activity: Yes    Partners: Female    Birth control/protection: Surgical  Other Topics Concern   Not on file  Social History Narrative   Married. Education: college and Other. Exercise 4 times a week for 50 minutes.   Social Drivers of Corporate investment banker Strain: Low Risk  (04/22/2023)   Overall Financial Resource Strain (CARDIA)    Difficulty of Paying Living Expenses: Not hard at all  Food Insecurity: No Food Insecurity (04/22/2023)   Hunger Vital Sign    Worried About Running Out of Food in the Last Year: Never true    Ran Out of Food in the Last Year: Never true  Transportation Needs: No Transportation Needs (04/22/2023)   PRAPARE - Administrator, Civil Service (Medical): No    Lack of Transportation (Non-Medical): No  Physical Activity: Sufficiently Active (04/22/2023)   Exercise Vital Sign    Days of Exercise per Week: 7 days    Minutes of Exercise per Session: 30 min  Stress: Stress Concern Present (04/22/2023)   [employer] of Occupational Health - Occupational Stress Questionnaire    Feeling of Stress : Rather much  Social Connections: Socially Integrated (04/22/2023)   Social Connection and Isolation Panel    Frequency of Communication with Friends  and Family: More than three times a week    Frequency of Social Gatherings with Friends and Family: Once a week    Attends Religious Services: More than 4 times per year    Active Member of [employer] or Organizations: Yes    Attends Banker Meetings: More than 4 times per year    Marital Status: Married  Catering manager Violence: Not At Risk (04/22/2023)   Humiliation, Afraid, Rape, and Kick questionnaire    Fear of Current or Ex-Partner:  No    Emotionally Abused: No    Physically Abused: No    Sexually Abused: No    Outpatient Medications Prior to Visit  Medication Sig Dispense Refill   amLODipine  (NORVASC ) 5 MG tablet Take 1 tablet (5 mg total) by mouth daily. 90 tablet 3   anastrozole  (ARIMIDEX ) 1 MG tablet Take 1 tablet (1 mg total) by mouth daily. 90 tablet 4   cholecalciferol (VITAMIN D3) 25 MCG (1000 UNIT) tablet Take 2 tablets (2,000 Units total) by mouth daily.     Cranberry 250 MG CAPS Take 1 capsule by mouth daily.     Cyanocobalamin  (VITAMIN B 12) 500 MCG TABS Take by mouth.     denosumab  (PROLIA ) 60 MG/ML SOSY injection Inject 60 mg into the skin every 6 (six) months. 1 mL 0   HYDROcodone -acetaminophen  (NORCO/VICODIN) 5-325 MG tablet TAKE 1 OR 2 TABLETS EVERY 6 HOURS AS NEEDED FOR PAIN. 90 tablet 0   losartan  (COZAAR ) 100 MG tablet Take 1 tablet (100 mg total) by mouth daily. 90 tablet 3   traZODone  (DESYREL ) 50 MG tablet TAKE 1 AND 1/2 TABLETS BY MOUTH AT BEDTIME AS NEEDED FOR SLEEP 135 tablet 1   No facility-administered medications prior to visit.    Allergies  Allergen Reactions   Morphine Itching    Review of Systems  Respiratory:  Positive for cough.        Objective:    Physical Exam Constitutional:      General: She is not in acute distress.    Appearance: Normal appearance. She is well-developed.  HENT:     Head: Normocephalic and atraumatic.     Right Ear: Tympanic membrane, ear canal and external ear normal.     Left Ear: Tympanic  membrane, ear canal and external ear normal.  Eyes:     General: No scleral icterus. Neck:     Thyroid : No thyromegaly.  Cardiovascular:     Rate and Rhythm: Normal rate and regular rhythm.     Heart sounds: Normal heart sounds. No murmur heard. Pulmonary:     Effort: Pulmonary effort is normal. No respiratory distress.     Breath sounds: Normal breath sounds. No wheezing.  Musculoskeletal:     Cervical back: Neck supple.  Skin:    General: Skin is warm and dry.  Neurological:     Mental Status: She is alert and oriented to person, place, and time.  Psychiatric:        Mood and Affect: Mood normal.        Behavior: Behavior normal.        Thought Content: Thought content normal.        Judgment: Judgment normal.      BP (!) 147/50 (BP Location: Left Arm, Patient Position: Sitting, Cuff Size: Small)   Pulse (!) 58   Temp 97.8 F (36.6 C) (Oral)   Resp 16   Ht 5' 3 (1.6 m)   Wt 123 lb (55.8 kg)   SpO2 100%   BMI 21.79 kg/m  Wt Readings from Last 3 Encounters:  06/07/24 123 lb (55.8 kg)  05/13/24 125 lb 8 oz (56.9 kg)  05/05/24 123 lb (55.8 kg)       Assessment & Plan:   Problem List Items Addressed This Visit       Unprioritized   Sinusitis - Primary    Symptoms consistent with sinusitis, no fever or purulent discharge. - Prescribed Augmentin  for pickup at [facility] if symptoms persist  beyond 10 days.  - Advised nasal saline spray or Netipot for relief.  - Advised to contact office if symptoms persist after three to four days of antibiotic use.       Relevant Medications   amoxicillin -clavulanate (AUGMENTIN ) 875-125 MG tablet    I am having Arlean HILARIO Reins start on amoxicillin -clavulanate. I am also having her maintain her cholecalciferol, Vitamin B 12, denosumab , anastrozole , Cranberry, traZODone , amLODipine , losartan , and HYDROcodone -acetaminophen .  Meds ordered this encounter  Medications   amoxicillin -clavulanate (AUGMENTIN ) 875-125 MG  tablet    Sig: Take 1 tablet by mouth 2 (two) times daily.    Dispense:  14 tablet    Refill:  0    Supervising Provider:   DOMENICA BLACKBIRD A [[id number]]

## 2024-06-15 ENCOUNTER — Ambulatory Visit: Payer: Self-pay

## 2024-06-15 NOTE — Telephone Encounter (Signed)
 FYI Only or Action Required?: Action required by provider: request for documentation or forms.  Patient was last seen in primary care on 06/07/2024 by Dawn Setter, NP.  Called Nurse Triage reporting Back Pain.  Symptoms began several years ago.  Interventions attempted: Prescription medications: Hydrocodone  and Ice/heat application.  Symptoms are: gradually worsening.  Triage Disposition: See PCP Within 2 Weeks  Patient/caregiver understands and will follow disposition?: Yes         Copied from CRM [id number]. Topic: Clinical - Red Word Triage >> Jun 15, 2024  9:33 AM [name] wrote: Red Word that prompted transfer to Nurse Triage: Patient is experiencing severe back. She states Dr.[name] is aware of her symptoms  as this is not a new symptom but the pain has become unbearable. Reason for Disposition  Back pain is a chronic symptom (recurrent or ongoing AND present > 4 weeks)  Answer Assessment - Initial Assessment Questions Patient using heat for symptoms. Not new symptoms but have increased recently in the last 2 months. Feels like nerve spasms and states her PCP is aware of symptoms and only wants to see her to discuss symptoms. Patient requesting a form to be filled out by her provider. Patient would like a follow up call regarding if she can have the form filled out before the end of the week.   1. ONSET: When did the pain begin? (e.g., minutes, hours, days)     15 years ago  2. LOCATION: Where does it hurt? (upper, mid or lower back)     R middle part of back under her ribs  3. SEVERITY: How bad is the pain?  (e.g., Scale 1-10; mild, moderate, or severe)     8/10 4. PATTERN: Is the pain constant? (e.g., yes, no; constant, intermittent)      Intermittent 3 sessions that last an hour every day  5. RADIATION: Does the pain shoot into your legs or somewhere else?     Denies  8. MEDICINES: What have you taken so far for the pain? (e.g., nothing,  acetaminophen , NSAIDS)     Hydrocodone   9. NEUROLOGIC SYMPTOMS: Do you have any weakness, numbness, or problems with bowel/bladder control?     Numbness in feet and hands that is not new but increasing  10. OTHER SYMPTOMS: Do you have any other symptoms? (e.g., fever, abdomen pain, burning with urination, blood in urine)       Denies  Protocols used: Back Pain-A-AH

## 2024-06-22 ENCOUNTER — Ambulatory Visit

## 2024-06-25 NOTE — Progress Notes (Signed)
 [facility] [address] [place], [place] [address] [phone]  Date:  06/28/2024   Name:  Dawn Powers   DOB:  Feb 18, 1946   MRN:  [id number]  PCP:  Watt Harlene BROCKS, MD    Chief Complaint: Back Pain (Onset early September Heriberto UTI onset 06/26/2024/Flu shot /Pt stopped cancer treatment due to joint pain /COVID shot 06/26/2024)   History of Present Illness:  Dawn Powers is a 78 y.o. very pleasant female patient who presents with the following:  Patient seen today with concern of back pain and other generalized joint pains.  I saw her most recently in August Aiden has history of breast cancer, endometrial cancer, hypertension, osteopenia  Her last visit her back was doing better-though she was having some numbness and tingling in her bilateral hands and fingers  Flu shot-give today COVID booster-recommend at pharmacy  Discussed the use of AI scribe software for clinical note transcription with the patient, who gave verbal consent to proceed.   History of Present Illness Dawn Powers is an 78 year old female who presents with urinary symptoms and back pain.  She has been experiencing frequent urination since the night before last, with an urgent need to urinate every few minutes. There is pressure and pain during urination. She initially took Azo and amoxicillin , the latter prescribed by her urologist for travel-related urinary issues, but discontinued both after consulting with a triage nurse. She feels better but remains anxious about a possible urinary tract infection.  She has a history of back pain, described as daily spasms occurring in the morning, after lying down, or at night. The pain is severe enough to require her to lie flat on the floor and sometimes necessitates the use of hydrocodone , which she takes sparingly. The pain resolves about 40 minutes after taking the medication. She recalls the pain  worsening around August, associating it with a change in her cancer medication, anastrozole , which she stopped due to joint pain.  She reports joint pain, particularly in her ankles and knees, which led to the cessation of anastrozole . She has been on crutches due to this pain, significantly affecting her mobility.  She also reports numbness in her fingers, which she feels is worsening. She is concerned about her ability to travel due to her back and joint pain, leading to the cancellation of a planned trip to [place] with her husband.  She did buy a travel insurance and needs some documentation regarding exacerbation of her chronic conditions which led to her canceling an upcoming trip.  Patient notes her pain exacerbation makes it virtually impossible for her to tolerate sitting on an airplane, as well as causing her a lot of pain with the physical activity of traveling   Results for orders placed or performed in visit on 06/28/24  POCT urinalysis dipstick   Collection Time: 06/28/24  2:53 PM  Result Value Ref Range   Color, UA yellow yellow   Clarity, UA clear clear   Glucose, UA negative negative mg/dL   Bilirubin, UA negative negative   Ketones, POC UA negative negative mg/dL   Spec Grav, UA <=8.994 (A) 1.010 - 1.025   Blood, UA negative negative   pH, UA 7.0 5.0 - 8.0   Protein Ur, POC negative negative mg/dL   Urobilinogen, UA 0.2 0.2 or 1.0 E.U./dL   Nitrite, UA Positive (A) Negative   Leukocytes, UA Trace (A) Negative  Patient Active Problem List   Diagnosis Date Noted   Sinusitis 06/07/2024   Osteoporosis 06/12/2021   Genetic testing 12/18/2020   Family history of prostate cancer    Family history of breast cancer    Family history of kidney cancer    Malignant neoplasm of upper-outer quadrant of left breast in female, 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/21/2020   Prediabetes 07/02/2019   S/P shoulder replacement, 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   Endometrial cancer (HCC) 09/26/2015    Essential hypertension 05/23/2015   Thoracic facet joint syndrome 04/27/2014   Back pain 03/24/2014   Muscle spasm 03/24/2014   Idiopathic scoliosis and kyphoscoliosis 12/22/2012   Osteopenia 12/22/2012   Other and unspecified hyperlipidemia 12/22/2012    Past Medical History:  Diagnosis Date   Anxiety    Arthritis    Breast cancer (HCC) 09/19/2020   left breast [facility] w/ radiation no chemo   Cancer (HCC)    Endometrial    Family history of breast cancer    Family history of kidney cancer    Family history of prostate cancer    Heart murmur    Hyperlipidemia    Hypertension    Neuromuscular disorder (HCC)    DDD   Osteoporosis 06/12/2021   Pain    lower back -hx DDD   Personal history of radiation therapy    left [facility] 08/2020 w/ radiation no chemo   Radiation 11/15/15, 11/23/15, 11/30/15, 12/07/15, 12/14/15   HDR brachytherapy    Past Surgical History:  Procedure Laterality Date   ABDOMINAL HYSTERECTOMY     BREAST LUMPECTOMY Left 10/19/2020   left lumpectomy 10-19-20   BREAST LUMPECTOMY WITH RADIOACTIVE SEED AND SENTINEL LYMPH NODE BIOPSY Left 10/19/2020   Procedure: LEFT BREAST LUMPECTOMY WITH RADIOACTIVE SEED AND SENTINEL LYMPH NODE BIOPSY;  Surgeon: Aron Shoulders, MD;  Location: [facility];  Service: General;  Laterality: Left;  PEC BLOCK, RNFA   CATARACT EXTRACTION Bilateral    COLONOSCOPY     FRACTURE SURGERY Right    ankle   GANGLION CYST EXCISION     LYMPH NODE BIOPSY N/A 09/26/2015   Procedure: SENTINEL LYMPH NODE BIOPSY;  Surgeon: Maurilio Ship, MD;  Location: [facility];  Service: Gynecology;  Laterality: N/A;   ROBOTIC ASSISTED TOTAL HYSTERECTOMY WITH BILATERAL SALPINGO OOPHERECTOMY Bilateral 09/26/2015   Procedure: ROBOTIC ASSISTED TOTAL HYSTERECTOMY WITH BILATERAL SALPINGO OOPHORECTOMY;  Surgeon: Maurilio Ship, MD;  Location: [facility];  Service: Gynecology;  Laterality: Bilateral;   TOTAL SHOULDER ARTHROPLASTY Left 09/10/2018   Procedure: TOTAL SHOULDER  ARTHROPLASTY;  Surgeon: Melita Drivers, MD;  Location: [facility];  Service: Orthopedics;  Laterality: Left;    WISDOM TOOTH EXTRACTION     WISDOM TOOTH EXTRACTION      Social History   Tobacco Use   Smoking status: Former    Current packs/day: 0.00    Types: Cigarettes    Start date: 09/30/1994    Quit date: 09/30/1998    Years since quitting: 25.7   Smokeless tobacco: Never  Vaping Use   Vaping status: Never Used  Substance Use Topics   Alcohol use: Yes    Alcohol/week: 3.0 standard drinks of alcohol    Types: 3 Standard drinks or equivalent per week    Comment: 1 drink 3 times per week   Drug use: No    Family History  Problem Relation Age of Onset   Heart disease Mother    Alcohol abuse Mother        breast cancer -  questionable    Dementia Mother    Alcohol abuse Father    Prostate cancer Father 54       prostate and colon- unsure of primary    Kidney cancer Brother 74   Alcohol abuse Maternal Grandfather    Alcohol abuse Sister    Basal cell carcinoma Sister    Breast cancer Cousin        double mastectomy, d. 22   Prostate cancer Brother 55   Esophageal cancer Neg Hx    Stomach cancer Neg Hx    Rectal cancer Neg Hx     Allergies  Allergen Reactions   Morphine Itching    Medication list has been reviewed and updated.  Current Outpatient Medications on File Prior to Visit  Medication Sig Dispense Refill   amLODipine  (NORVASC ) 5 MG tablet Take 1 tablet (5 mg total) by mouth daily. 90 tablet 3   amoxicillin -clavulanate (AUGMENTIN ) 875-125 MG tablet Take 1 tablet by mouth 2 (two) times daily. 14 tablet 0   anastrozole  (ARIMIDEX ) 1 MG tablet Take 1 tablet (1 mg total) by mouth daily. 90 tablet 4   cholecalciferol (VITAMIN D3) 25 MCG (1000 UNIT) tablet Take 2 tablets (2,000 Units total) by mouth daily.     Cranberry 250 MG CAPS Take 1 capsule by mouth daily.     Cyanocobalamin  (VITAMIN B 12) 500 MCG TABS Take by mouth.     denosumab  (PROLIA ) 60 MG/ML SOSY  injection Inject 60 mg into the skin every 6 (six) months. 1 mL 0   HYDROcodone -acetaminophen  (NORCO/VICODIN) 5-325 MG tablet TAKE 1 OR 2 TABLETS EVERY 6 HOURS AS NEEDED FOR PAIN. 90 tablet 0   losartan  (COZAAR ) 100 MG tablet Take 1 tablet (100 mg total) by mouth daily. 90 tablet 3   No current facility-administered medications on file prior to visit.    Review of Systems:  As per HPI- otherwise negative.   Physical Examination: Vitals:   06/28/24 1439  BP: 132/64  Pulse: 64  SpO2: 100%   Vitals:   06/28/24 1439  Weight: 123 lb 3.2 oz (55.9 kg)  Height: 5' 3 (1.6 m)   Body mass index is 21.82 kg/m. Ideal Body Weight: Weight in (lb) to have BMI = 25: 140.8  GEN: no acute distress. Normal weight, looks well  HEENT: Atraumatic, Normocephalic.  Ears and Nose: No external deformity. CV: RRR, No M/G/R. No JVD. No thrill. No extra heart sounds. PULM: CTA B, no wheezes, crackles, rhonchi. No retractions. No resp. distress. No accessory muscle use. ABD: S, NT, ND EXTR: No c/c/e PSYCH: Normally interactive. Conversant.  No CVA tenderness, belly is benign  Assessment and Plan: Dysuria - Plan: Urine Culture, POCT urinalysis dipstick  Insomnia, unspecified type - Plan: traZODone  (DESYREL ) 50 MG tablet  Assessment & Plan Suspected urinary tract infection Urine dipstick indicated UTI, culture pending. Previous amoxicillin  use noted.  She took 1 dose of amoxicillin  that she had on hand, she knows she has a full 10-day prescription at home - Start amoxicillin  500 mg orally twice daily. - Adjust antibiotic if culture shows resistance. - Allow Azo for symptom relief.  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 - Continue hydrocodone -acetaminophen  as needed. - Encourage lying flat for relief. - Complete insurance form for travel cancellation.  Joint pain due to aromatase inhibitor  therapy Joint pain from anastrozole , severe, requiring crutches. Coordination with oncology ongoing. - Discontinue anastrozole . -  Coordinate with oncology for alternative treatment.  Insomnia Intermittent insomnia managed with trazodone . - Refill trazodone  prescription.  Anxiety Increased anxiety related to health and travel concerns, relieved by not traveling. - Provided reassurance and support.  General Health Maintenance Routine health maintenance discussed, COVID-19 vaccination confirmed, flu shot requested. - Administer flu vaccine.  Signed Harlene Schroeder, MD  Addnd 10/1- received urine culture, negative for infection Message to pt  Results for orders placed or performed in visit on 06/28/24  POCT urinalysis dipstick   Collection Time: 06/28/24  2:53 PM  Result Value Ref Range   Color, UA yellow yellow   Clarity, UA clear clear   Glucose, UA negative negative mg/dL   Bilirubin, UA negative negative   Ketones, POC UA negative negative mg/dL   Spec Grav, UA <=8.994 (A) 1.010 - 1.025   Blood, UA negative negative   pH, UA 7.0 5.0 - 8.0   Protein Ur, POC negative negative mg/dL   Urobilinogen, UA 0.2 0.2 or 1.0 E.U./dL   Nitrite, UA Positive (A) Negative   Leukocytes, UA Trace (A) Negative  Urine Culture   Collection Time: 06/28/24  4:22 PM   Specimen: Urine  Result Value Ref Range   MICRO NUMBER: [id number]    SPECIMEN QUALITY: Adequate    Sample Source URINE    STATUS: FINAL    Result: No Growth

## 2024-06-26 DIAGNOSIS — Z23 Encounter for immunization: Secondary | ICD-10-CM | POA: Diagnosis not present

## 2024-06-28 ENCOUNTER — Telehealth: Payer: Self-pay

## 2024-06-28 ENCOUNTER — Encounter: Payer: Self-pay | Admitting: Family Medicine

## 2024-06-28 ENCOUNTER — Ambulatory Visit (INDEPENDENT_AMBULATORY_CARE_PROVIDER_SITE_OTHER): Admitting: Family Medicine

## 2024-06-28 VITALS — BP 132/64 | HR 64 | Ht 63.0 in | Wt 123.2 lb

## 2024-06-28 DIAGNOSIS — R300 Dysuria: Secondary | ICD-10-CM | POA: Diagnosis not present

## 2024-06-28 DIAGNOSIS — G4700 Insomnia, unspecified: Secondary | ICD-10-CM | POA: Diagnosis not present

## 2024-06-28 DIAGNOSIS — Z23 Encounter for immunization: Secondary | ICD-10-CM | POA: Diagnosis not present

## 2024-06-28 DIAGNOSIS — M47894 Other spondylosis, thoracic region: Secondary | ICD-10-CM | POA: Diagnosis not present

## 2024-06-28 LAB — POCT URINALYSIS DIP (MANUAL ENTRY)
Bilirubin, UA: NEGATIVE
Blood, UA: NEGATIVE
Glucose, UA: NEGATIVE mg/dL
Ketones, POC UA: NEGATIVE mg/dL
Nitrite, UA: POSITIVE — AB
Protein Ur, POC: NEGATIVE mg/dL
Spec Grav, UA: <=1.005 — AB (ref 1.010–1.025)
Urobilinogen, UA: 0.2 U/dL
pH, UA: 7 (ref 5.0–8.0)

## 2024-06-28 MED ORDER — TRAZODONE HCL 50 MG PO TABS
75.0000 mg | ORAL_TABLET | Freq: Every day | ORAL | 3 refills | Status: AC
Start: 2024-06-28 — End: ?

## 2024-06-28 NOTE — Patient Instructions (Addendum)
 I will be in touch with your urine culture  Go ahead and start on the amox you have at home- assuming you have 500 mg take it twice a day until we get your urine culture back Recommend covid booster- done!  Recommend flu shot - done today!

## 2024-06-28 NOTE — Telephone Encounter (Signed)
 Appt scheduled

## 2024-06-28 NOTE — Telephone Encounter (Signed)
 Initial Comment Caller states last night she felt like she was getting a UTI, pt peed in a jar and she is scheduled to see her doctor tomorrow. Pt needs to know if she can start amoxicillin  being that she was previously given the medication if she felt one was coming on. Pt states she has urgency and she took azo and her pee was orange and she needs to know what to do. Pt has an appt tomorrow Translation No Nurse Assessment Nurse: Cordella, RN, Scarlet Date/Time (Eastern Time): 06/27/2024 10:19:51 AM Confirm and document reason for call. If symptomatic, describe symptoms. ---Caller states has hx of frequent urinary infections. Last night started having urinary frequency. Took AZO tabs and was given an Amoxicillin  for preventative possible urinary infection. Caller requesting information on whether or not she needs to start the Amoxicillin . Does the patient have any new or worsening symptoms? ---Yes Will a triage be completed? ---Yes Related visit to physician within the last 2 weeks? ---No Does the PT have any chronic conditions? (i.e. diabetes, asthma, this includes High risk factors for pregnancy, etc.) ---Yes List chronic conditions. ---Htn, Hx Breast Ca, Arthritis Is this a behavioral health or substance abuse call? ---No Guidelines Guideline Title Affirmed Question Affirmed Notes Nurse Date/Time (Eastern Time) Urinary Symptoms Urinating more frequently than usual (i.e., frequency) OR Cordella, RN, Scarlet 06/27/2024 10:26:21 AM PLEASE NOTE: All timestamps contained within this report are represented as [place] Standard Time. CONFIDENTIALTY NOTICE: This fax transmission is intended only for the addressee. It contains information that is legally privileged, confidential or otherwise protected from use or disclosure. If you are not the intended recipient, you are strictly prohibited from reviewing, disclosing, copying using or disseminating any of this information or taking any  action in reliance on or regarding this information. If you have received this fax in error, please notify us  immediately by telephone so that we can arrange for its return to us . Phone: [phone], Toll-Free: [phone], Fax: [phone] [email or web address] 02/22/1946 Page: 1 of2 CallId: [id number] Guidelines Guideline Title Affirmed Question Affirmed Notes Nurse Date/Time Titus Time) new-onset of the feeling of an urgent need to urinate (i.e., urgency) Disp. Time Titus Time) Disposition Final User 06/27/2024 10:40:39 AM See PCP within 24 Hours Yes Cordella, RN, Scarlet Final Disposition 06/27/2024 10:40:39 AM See PCP within 24 Hours Yes Cordella, RN, Scarlet Caller Disagree/Comply Comply Caller Understands Yes PreDisposition Did not know what to do Care Advice Given Per Guideline SEE PCP WITHIN 24 HOURS: DRINK PLENTY OF LIQUIDS: * Drink plenty of liquids. It is important to stay well-hydrated. * A healthy adult should drink 8 cups (240 ml) or more of liquid each day. * How can you tell if you are drinking enough liquids? The goal is to keep the urine clear or light-yellow in color. If your urine is bright yellow or dark yellow, you are probably not drinking enough liquids. CALL BACK IF: * Fever occurs * Unable to urinate and bladder feels full * You become worse CARE ADVICE given per Urinary Symptoms (Adult) guideline. Referrals REFERRED TO PCP OFFICE

## 2024-06-29 LAB — URINE CULTURE
MICRO NUMBER:: 17029619
Result:: NO GROWTH
SPECIMEN QUALITY:: ADEQUATE

## 2024-06-30 ENCOUNTER — Encounter: Payer: Self-pay | Admitting: Family Medicine

## 2024-07-12 ENCOUNTER — Telehealth: Payer: Self-pay

## 2024-07-12 ENCOUNTER — Inpatient Hospital Stay: Attending: Adult Health | Admitting: Physician Assistant

## 2024-07-12 VITALS — BP 147/63 | HR 64 | Temp 97.6°F | Resp 16 | Ht 63.0 in | Wt 123.1 lb

## 2024-07-12 DIAGNOSIS — N644 Mastodynia: Secondary | ICD-10-CM | POA: Diagnosis not present

## 2024-07-12 DIAGNOSIS — Z803 Family history of malignant neoplasm of breast: Secondary | ICD-10-CM | POA: Insufficient documentation

## 2024-07-12 DIAGNOSIS — Z8051 Family history of malignant neoplasm of kidney: Secondary | ICD-10-CM | POA: Insufficient documentation

## 2024-07-12 DIAGNOSIS — M810 Age-related osteoporosis without current pathological fracture: Secondary | ICD-10-CM | POA: Insufficient documentation

## 2024-07-12 DIAGNOSIS — G629 Polyneuropathy, unspecified: Secondary | ICD-10-CM | POA: Insufficient documentation

## 2024-07-12 DIAGNOSIS — C50412 Malignant neoplasm of upper-outer quadrant of left female breast: Secondary | ICD-10-CM | POA: Insufficient documentation

## 2024-07-12 DIAGNOSIS — Z79899 Other long term (current) drug therapy: Secondary | ICD-10-CM | POA: Insufficient documentation

## 2024-07-12 DIAGNOSIS — Z87891 Personal history of nicotine dependence: Secondary | ICD-10-CM | POA: Diagnosis not present

## 2024-07-12 DIAGNOSIS — Z8542 Personal history of malignant neoplasm of other parts of uterus: Secondary | ICD-10-CM | POA: Diagnosis not present

## 2024-07-12 DIAGNOSIS — I10 Essential (primary) hypertension: Secondary | ICD-10-CM | POA: Insufficient documentation

## 2024-07-12 DIAGNOSIS — Z170 Estrogen receptor positive status [ER+]: Secondary | ICD-10-CM | POA: Diagnosis not present

## 2024-07-12 DIAGNOSIS — Z79811 Long term (current) use of aromatase inhibitors: Secondary | ICD-10-CM | POA: Diagnosis not present

## 2024-07-12 NOTE — Telephone Encounter (Signed)
 S/w patient regarding VM about new tenderness, swelling, and lump in breast.  Patient agreeable to coming in to the clinic for further evaluation by our Symptom Management Clinic. Patient scheduled and confirmed new appointment date and time.

## 2024-07-12 NOTE — Progress Notes (Signed)
 Symptom Management Consult Note [facility]    Patient Care Team: [name], Harlene BROCKS, MD as PCP - General (Family Medicine) Shannon Agent, MD as Consulting Physician (Radiation Oncology) Horacio Boas, MD as Consulting Physician (Obstetrics and Gynecology) Odean Potts, MD as Consulting Physician (Hematology and Oncology) Matilda Senior, MD as Consulting Physician (Urology)    Name / MRN / DOB: Dawn Powers  [id number]  15-Jun-1946   Date of visit: 07/12/2024   Chief Complaint/Reason for visit: left breast pain    ASSESSMENT AND PLAN Patient is a 78 y.o. 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 Odean.  I have viewed most recent oncology note and lab work.  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 - Next appointment with oncologist is 01/28/24   #Left breast pain - Chart review shows bilateral screening mammogram on 11/12/23 shows no evidence of malignancy. - Intermittent swelling and tenderness likely due to lymphedema post-lumpectomy. Symptoms improved with self-administered drainage. No palpable left breast mass or lymphadenopathy on exam. - Engaged in shared decision making with patient. She prefers to continue self-manual lymph drainage technique at home as symptoms are improving, and no palpable mass appreciated on exam. If symptoms do not continue to improve would recommend referral to PT vs diagnostic mammogram if repeat breast exam is abnormal.  - Patient aware she is due for screening mammogram in February 2026.  Strict ED precautions discussed should symptoms worsen.   HEME/ONC HISTORY Oncology History  Endometrial cancer (HCC)  09/26/2015 Initial Diagnosis   Endometrial cancer (HCC)   09/21/2020  Cancer Staging   Staging form: Corpus Uteri - Carcinoma, AJCC 7th Edition - Clinical: FIGO Stage IB (T1b, N0, M0) - Signed by Layla Sandria BROCKS, MD on 09/21/2020   Malignant neoplasm of upper-outer quadrant of left breast in female, estrogen receptor positive (HCC)  09/21/2020 Initial Diagnosis   Malignant neoplasm of upper-outer quadrant of left breast in female, estrogen receptor positive (HCC)   09/21/2020 Cancer Staging   Staging form: Breast, AJCC 8th Edition - Clinical: Stage IA (cT1c, cN0, cM0, G3, ER+, PR+, HER2: Equivocal) - Signed by Layla Sandria BROCKS, MD on 09/21/2020    Genetic Testing   Negative genetic testing. No pathogenic variants identified on the [facility] Multi-Cancer Panel +RNA. The report date is 12/17/2020.  The Multi-Cancer Panel + RNA offered by [facility] includes sequencing and/or deletion duplication testing of the following 84 genes: AIP, ALK, APC, ATM, AXIN2,BAP1,  BARD1, BLM, BMPR1A, BRCA1, BRCA2, BRIP1, CASR, CDC73, CDH1, CDK4, CDKN1B, CDKN1C, CDKN2A (p14ARF), CDKN2A (p16INK4a), CEBPA, CHEK2, CTNNA1, DICER1, DIS3L2, EGFR (c.2369C>T, p.Thr790Met variant only), EPCAM (Deletion/duplication testing only), FH, FLCN, GATA2, GPC3, GREM1 (Promoter region deletion/duplication testing only), HOXB13 (c.251G>A, p.Gly84Glu), HRAS, KIT, MAX, MEN1, MET, MITF (c.952G>A, p.Glu318Lys variant only), MLH1, MSH2, MSH3, MSH6, MUTYH, NBN, NF1, NF2, NTHL1, PALB2, PDGFRA, PHOX2B, PMS2, POLD1, POLE, POT1, PRKAR1A, PTCH1, PTEN, RAD50, RAD51C, RAD51D, RB1, RECQL4, RET, RUNX1, SDHAF2, SDHA (sequence changes only), SDHB, SDHC, SDHD, SMAD4, SMARCA4, SMARCB1, SMARCE1, STK11, SUFU, TERC, TERT, TMEM127, TP53, TSC1, TSC2, VHL, WRN and WT1.       INTERVAL HISTORY  Discussed the use of AI scribe software for clinical note transcription with the patient, who gave verbal consent to proceed.    Dawn Powers is a 78 y.o. female  with oncologic history as above presenting [facility] with chief  complaint of left breast pain. Patient presents unaccompanied to visit today.  She has experienced tenderness in her left breast for the past week, described as a sensation of 'junk' and soreness, which has since improved. Swelling was noted, and she has a history of infections and lymphedema. She has a history of lymphedema and has experienced similar symptoms in the past, which tend to fluctuate.  She recently resumed drainage techniques recently, leading to symptom improvement.   She recalls a possible incident where her dog might have jumped on her, but is unsure if this contributed to her symptoms. She has started a new exercise routine aimed at improving posture, involving wall exercises performed a couple of times a day, though she doubts this is related to her symptoms.   ROS  All other systems are reviewed and are negative for acute change except as noted in the HPI.    Allergies  Allergen Reactions   Morphine Itching     Past Medical History:  Diagnosis Date   Anxiety    Arthritis    Breast cancer (HCC) 09/19/2020   left breast [facility] w/ radiation no chemo   Cancer (HCC)    Endometrial    Family history of breast cancer    Family history of kidney cancer    Family history of prostate cancer    Heart murmur    Hyperlipidemia    Hypertension    Neuromuscular disorder (HCC)    DDD   Osteoporosis 06/12/2021   Pain    lower back -hx DDD   Personal history of radiation therapy    left [facility] 08/2020 w/ radiation no chemo   Radiation 11/15/15, 11/23/15, 11/30/15, 12/07/15, 12/14/15   HDR brachytherapy     Past Surgical History:  Procedure Laterality Date   ABDOMINAL HYSTERECTOMY     BREAST LUMPECTOMY Left 10/19/2020   left lumpectomy 10-19-20   BREAST LUMPECTOMY WITH RADIOACTIVE SEED AND SENTINEL LYMPH NODE BIOPSY Left 10/19/2020   Procedure: LEFT BREAST LUMPECTOMY WITH RADIOACTIVE SEED AND SENTINEL LYMPH NODE BIOPSY;  Surgeon: Aron Shoulders, MD;  Location:  SURGERY  CENTER;  Service: General;  Laterality: Left;  PEC BLOCK, RNFA   CATARACT EXTRACTION Bilateral    COLONOSCOPY     FRACTURE SURGERY Right    ankle   GANGLION CYST EXCISION     LYMPH NODE BIOPSY N/A 09/26/2015   Procedure: SENTINEL LYMPH NODE BIOPSY;  Surgeon: Maurilio Ship, MD;  Location: [facility];  Service: Gynecology;  Laterality: N/A;   ROBOTIC ASSISTED TOTAL HYSTERECTOMY WITH BILATERAL SALPINGO OOPHERECTOMY Bilateral 09/26/2015   Procedure: ROBOTIC ASSISTED TOTAL HYSTERECTOMY WITH BILATERAL SALPINGO OOPHORECTOMY;  Surgeon: Maurilio Ship, MD;  Location: [facility];  Service: Gynecology;  Laterality: Bilateral;   TOTAL SHOULDER ARTHROPLASTY Left 09/10/2018   Procedure: TOTAL SHOULDER ARTHROPLASTY;  Surgeon: Melita Drivers, MD;  Location: [facility] OR;  Service: Orthopedics;  Laterality: Left;    WISDOM TOOTH EXTRACTION     WISDOM TOOTH EXTRACTION      Social History   Socioeconomic History   Marital status: Married    Spouse name: Curtistine   Number of children: 2   Years of education: Not on file   Highest education level: Not on file  Occupational History   Occupation: retired Manufacturing engineer  Tobacco Use   Smoking status: Former    Current packs/day: 0.00    Types: Cigarettes    Start date: 09/30/1994  Quit date: 09/30/1998    Years since quitting: 25.8   Smokeless tobacco: Never  Vaping Use   Vaping status: Never Used  Substance and Sexual Activity   Alcohol use: Yes    Alcohol/week: 3.0 standard drinks of alcohol    Types: 3 Standard drinks or equivalent per week    Comment: 1 drink 3 times per week   Drug use: No   Sexual activity: Yes    Partners: Female    Birth control/protection: Surgical  Other Topics Concern   Not on file  Social History Narrative   Married. Education: college and Other. Exercise 4 times a week for 50 minutes.   Social Drivers of Corporate investment banker Strain: Low Risk  (04/22/2023)   Overall Financial Resource Strain (CARDIA)    Difficulty  of Paying Living Expenses: Not hard at all  Food Insecurity: No Food Insecurity (04/22/2023)   Hunger Vital Sign    Worried About Running Out of Food in the Last Year: Never true    Ran Out of Food in the Last Year: Never true  Transportation Needs: No Transportation Needs (04/22/2023)   PRAPARE - Administrator, Civil Service (Medical): No    Lack of Transportation (Non-Medical): No  Physical Activity: Sufficiently Active (04/22/2023)   Exercise Vital Sign    Days of Exercise per Week: 7 days    Minutes of Exercise per Session: 30 min  Stress: Stress Concern Present (04/22/2023)   [employer] of Occupational Health - Occupational Stress Questionnaire    Feeling of Stress : Rather much  Social Connections: Socially Integrated (04/22/2023)   Social Connection and Isolation Panel    Frequency of Communication with Friends and Family: More than three times a week    Frequency of Social Gatherings with Friends and Family: Once a week    Attends Religious Services: More than 4 times per year    Active Member of [employer] or Organizations: Yes    Attends Engineer, structural: More than 4 times per year    Marital Status: Married  Catering manager Violence: Not At Risk (04/22/2023)   Humiliation, Afraid, Rape, and Kick questionnaire    Fear of Current or Ex-Partner: No    Emotionally Abused: No    Physically Abused: No    Sexually Abused: No    Family History  Problem Relation Age of Onset   Heart disease Mother    Alcohol abuse Mother        breast cancer - questionable    Dementia Mother    Alcohol abuse Father    Prostate cancer Father 19       prostate and colon- unsure of primary    Kidney cancer Brother 30   Alcohol abuse Maternal Grandfather    Alcohol abuse Sister    Basal cell carcinoma Sister    Breast cancer Cousin        double mastectomy, d. 37   Prostate cancer Brother 48   Esophageal cancer Neg Hx    Stomach cancer Neg Hx    Rectal cancer Neg  Hx      Current Outpatient Medications:    amLODipine  (NORVASC ) 5 MG tablet, Take 1 tablet (5 mg total) by mouth daily., Disp: 90 tablet, Rfl: 3   anastrozole  (ARIMIDEX ) 1 MG tablet, Take 1 tablet (1 mg total) by mouth daily. (Patient not taking: Reported on 07/12/2024), Disp: 90 tablet, Rfl: 4   cholecalciferol (VITAMIN D3) 25 MCG (1000 UNIT)  tablet, Take 2 tablets (2,000 Units total) by mouth daily., Disp: , Rfl:    Cranberry 250 MG CAPS, Take 1 capsule by mouth daily., Disp: , Rfl:    Cyanocobalamin  (VITAMIN B 12) 500 MCG TABS, Take by mouth., Disp: , Rfl:    denosumab  (PROLIA ) 60 MG/ML SOSY injection, Inject 60 mg into the skin every 6 (six) months., Disp: 1 mL, Rfl: 0   HYDROcodone -acetaminophen  (NORCO/VICODIN) 5-325 MG tablet, TAKE 1 OR 2 TABLETS EVERY 6 HOURS AS NEEDED FOR PAIN., Disp: 90 tablet, Rfl: 0   losartan  (COZAAR ) 100 MG tablet, Take 1 tablet (100 mg total) by mouth daily., Disp: 90 tablet, Rfl: 3   traZODone  (DESYREL ) 50 MG tablet, Take 1.5 tablets (75 mg total) by mouth at bedtime. E AS NEEDED FOR SLEEP, Disp: 135 tablet, Rfl: 3  PHYSICAL EXAM ECOG FS:1 - Symptomatic but completely ambulatory    Vitals:   07/12/24 1059  BP: (!) 147/63  Pulse: 64  Resp: 16  Temp: 97.6 F (36.4 C)  TempSrc: Temporal  SpO2: 98%  Weight: 123 lb 1.6 oz (55.8 kg)  Height: 5' 3 (1.6 m)   Physical Exam Vitals and nursing note reviewed. Exam conducted with a chaperone present.  Constitutional:      Appearance: She is not ill-appearing or toxic-appearing.  HENT:     Head: Normocephalic.  Eyes:     Conjunctiva/sclera: Conjunctivae normal.  Cardiovascular:     Rate and Rhythm: Normal rate.  Pulmonary:     Effort: Pulmonary effort is normal.  Chest:     Comments: Left breast slightly larger compared to right. Baseline per patient. No overlying skin changes. No evidence of any palpable masses. No evidence of axillary adenopathy. No evidence of any palpable masses or lumps in the left  breast. No evidence of left axillary adenopathy  Abdominal:     General: There is no distension.  Musculoskeletal:     Cervical back: Normal range of motion.  Skin:    General: Skin is warm and dry.  Neurological:     Mental Status: She is alert.        LABORATORY DATA I have reviewed the data as listed    Latest Ref Rng & Units 01/27/2024    8:55 AM 07/29/2023    1:25 PM 01/15/2023   10:07 AM  CBC  WBC 4.0 - 10.5 K/uL 4.5  4.8  4.7   Hemoglobin 12.0 - 15.0 g/dL 86.7  87.4  86.8   Hematocrit 36.0 - 46.0 % 38.4  36.6  38.5   Platelets 150 - 400 K/uL 238  248  238.0         Latest Ref Rng & Units 01/27/2024    8:55 AM 07/29/2023    1:25 PM 01/15/2023   10:07 AM  CMP  Glucose 70 - 99 mg/dL 86  882  87   BUN 8 - 23 mg/dL 21  20  21    Creatinine 0.44 - 1.00 mg/dL 8.95  8.96  8.88   Sodium 135 - 145 mmol/L 141  139  139   Potassium 3.5 - 5.1 mmol/L 4.0  3.8  4.8   Chloride 98 - 111 mmol/L 104  103  101   CO2 22 - 32 mmol/L 30  31  27    Calcium 8.9 - 10.3 mg/dL 9.5  9.9  9.8   Total Protein 6.5 - 8.1 g/dL 6.9  7.0  6.7   Total Bilirubin 0.0 - 1.2 mg/dL 0.5  0.6  0.6   Alkaline Phos 38 - 126 U/L 37  37  34   AST 15 - 41 U/L 22  18  20    ALT 0 - 44 U/L 19  11  13         RADIOGRAPHIC STUDIES (from last 24 hours if applicable) I have personally reviewed the radiological images as listed and agreed with the findings in the report. No results found.      Visit Diagnosis: 1. Malignant neoplasm of upper-outer quadrant of left breast in female, estrogen receptor positive (HCC)   2. Breast pain, left      No orders of the defined types were placed in this encounter.   All questions were answered. The patient knows to call the clinic with any problems, questions or concerns. No barriers to learning was detected.  A total of more than 30 minutes were spent on this encounter with face-to-face time and non-face-to-face time, including preparing to see the patient,  ordering tests and/or medications, counseling the patient and coordination of care as outlined above.    Thank you for allowing me to participate in the care of this patient.    Renesmay Nesbitt E  Walisiewicz, PA-C Department of Hematology/Oncology [facility] at [facility] Phone: [phone]  [phone]    07/12/2024 3:21 PM

## 2024-07-21 ENCOUNTER — Telehealth: Payer: Self-pay

## 2024-07-21 NOTE — Telephone Encounter (Signed)
 Copied from CRM #[id number]. Topic: General - Other >> Jul 21, 2024  8:59 AM Thersia BROCKS wrote: Reason for CRM: Patient called in regarding needing to speak with Dr.[name] regarding a form that she needs signed would like for someone to call today

## 2024-07-24 ENCOUNTER — Other Ambulatory Visit: Payer: Self-pay | Admitting: Family Medicine

## 2024-07-24 DIAGNOSIS — G894 Chronic pain syndrome: Secondary | ICD-10-CM

## 2024-07-26 NOTE — Telephone Encounter (Signed)
 Pt dropped off form to be completed by pcp. Pt wants to be called when ready, placed in box in fo.

## 2024-07-26 NOTE — Telephone Encounter (Signed)
 That is ok

## 2024-07-26 NOTE — Telephone Encounter (Signed)
 Pt also states she has been having trouble with back pain and sleep. Pt would like to PCP's opinion on using CBD drops and if it will interact with any of her medications. Advised pt I would give her a call back.

## 2024-07-26 NOTE — Telephone Encounter (Signed)
 Paperwork has been completed by PCP. Spoke with pt, pt is aware and expressed understanding and will come by to pick the paperwork up tomorrow 07/27/2024.

## 2024-07-28 ENCOUNTER — Other Ambulatory Visit: Payer: Self-pay

## 2024-07-28 DIAGNOSIS — C50412 Malignant neoplasm of upper-outer quadrant of left female breast: Secondary | ICD-10-CM

## 2024-07-29 ENCOUNTER — Inpatient Hospital Stay

## 2024-07-29 VITALS — BP 155/68 | HR 64 | Temp 97.9°F | Resp 16

## 2024-07-29 DIAGNOSIS — M810 Age-related osteoporosis without current pathological fracture: Secondary | ICD-10-CM

## 2024-07-29 DIAGNOSIS — N644 Mastodynia: Secondary | ICD-10-CM | POA: Diagnosis not present

## 2024-07-29 DIAGNOSIS — Z170 Estrogen receptor positive status [ER+]: Secondary | ICD-10-CM

## 2024-07-29 DIAGNOSIS — Z79899 Other long term (current) drug therapy: Secondary | ICD-10-CM | POA: Diagnosis not present

## 2024-07-29 DIAGNOSIS — Z79811 Long term (current) use of aromatase inhibitors: Secondary | ICD-10-CM | POA: Diagnosis not present

## 2024-07-29 DIAGNOSIS — C50412 Malignant neoplasm of upper-outer quadrant of left female breast: Secondary | ICD-10-CM | POA: Diagnosis not present

## 2024-07-29 LAB — GUARDANT REVEAL

## 2024-07-29 LAB — CBC WITH DIFFERENTIAL (CANCER CENTER ONLY)
Abs Immature Granulocytes: 0.01 K/uL (ref 0.00–0.07)
Basophils Absolute: 0.1 K/uL (ref 0.0–0.1)
Basophils Relative: 1 %
Eosinophils Absolute: 0.1 K/uL (ref 0.0–0.5)
Eosinophils Relative: 2 %
HCT: 37.9 % (ref 36.0–46.0)
Hemoglobin: 13.5 g/dL (ref 12.0–15.0)
Immature Granulocytes: 0 %
Lymphocytes Relative: 27 %
Lymphs Abs: 1 K/uL (ref 0.7–4.0)
MCH: 33.4 pg (ref 26.0–34.0)
MCHC: 35.6 g/dL (ref 30.0–36.0)
MCV: 93.8 fL (ref 80.0–100.0)
Monocytes Absolute: 0.3 K/uL (ref 0.1–1.0)
Monocytes Relative: 7 %
Neutro Abs: 2.4 K/uL (ref 1.7–7.7)
Neutrophils Relative %: 63 %
Platelet Count: 251 K/uL (ref 150–400)
RBC: 4.04 MIL/uL (ref 3.87–5.11)
RDW: 12.4 % (ref 11.5–15.5)
WBC Count: 3.9 K/uL — ABNORMAL LOW (ref 4.0–10.5)
nRBC: 0 % (ref 0.0–0.2)

## 2024-07-29 LAB — CMP (CANCER CENTER ONLY)
ALT: 17 U/L (ref 0–44)
AST: 22 U/L (ref 15–41)
Albumin: 4.6 g/dL (ref 3.5–5.0)
Alkaline Phosphatase: 39 U/L (ref 38–126)
Anion gap: 7 (ref 5–15)
BUN: 18 mg/dL (ref 8–23)
CO2: 29 mmol/L (ref 22–32)
Calcium: 9.7 mg/dL (ref 8.9–10.3)
Chloride: 102 mmol/L (ref 98–111)
Creatinine: 1.02 mg/dL — ABNORMAL HIGH (ref 0.44–1.00)
GFR, Estimated: 56 mL/min — ABNORMAL LOW (ref >60)
Glucose, Bld: 99 mg/dL (ref 70–99)
Potassium: 4 mmol/L (ref 3.5–5.1)
Sodium: 138 mmol/L (ref 135–145)
Total Bilirubin: 0.6 mg/dL (ref 0.0–1.2)
Total Protein: 7.3 g/dL (ref 6.5–8.1)

## 2024-07-29 MED ORDER — CYANOCOBALAMIN 1000 MCG/ML IJ SOLN: 1000.0000 ug | Freq: Once | INTRAMUSCULAR | Status: DC

## 2024-07-29 MED ORDER — DENOSUMAB 60 MG/ML ~~LOC~~ SOSY
60.0000 mg | PREFILLED_SYRINGE | Freq: Once | SUBCUTANEOUS | Status: AC
  Administered 2024-07-29: 60 mg via SUBCUTANEOUS
  Filled 2024-07-29: qty 1

## 2024-07-29 NOTE — Telephone Encounter (Signed)
 Mychart message has been sent to pt.

## 2024-08-02 ENCOUNTER — Encounter: Payer: Self-pay | Admitting: Radiology

## 2024-08-04 ENCOUNTER — Encounter: Payer: Self-pay | Admitting: Adult Health

## 2024-08-17 ENCOUNTER — Telehealth (HOSPITAL_BASED_OUTPATIENT_CLINIC_OR_DEPARTMENT_OTHER): Payer: Self-pay

## 2024-08-28 ENCOUNTER — Other Ambulatory Visit: Payer: Self-pay | Admitting: Family Medicine

## 2024-08-28 DIAGNOSIS — I10 Essential (primary) hypertension: Secondary | ICD-10-CM

## 2024-09-08 DIAGNOSIS — M25561 Pain in right knee: Secondary | ICD-10-CM | POA: Diagnosis not present

## 2024-09-08 DIAGNOSIS — G8929 Other chronic pain: Secondary | ICD-10-CM | POA: Diagnosis not present

## 2024-09-08 DIAGNOSIS — M25551 Pain in right hip: Secondary | ICD-10-CM | POA: Diagnosis not present

## 2024-09-15 ENCOUNTER — Inpatient Hospital Stay (HOSPITAL_BASED_OUTPATIENT_CLINIC_OR_DEPARTMENT_OTHER): Admission: RE | Admit: 2024-09-15 | Discharge: 2024-09-15 | Attending: Adult Health | Admitting: Adult Health

## 2024-09-15 DIAGNOSIS — M810 Age-related osteoporosis without current pathological fracture: Secondary | ICD-10-CM | POA: Insufficient documentation

## 2024-09-16 ENCOUNTER — Ambulatory Visit: Payer: Self-pay

## 2024-09-17 ENCOUNTER — Inpatient Hospital Stay: Admitting: Adult Health

## 2024-09-17 DIAGNOSIS — M810 Age-related osteoporosis without current pathological fracture: Secondary | ICD-10-CM

## 2024-09-17 NOTE — Progress Notes (Signed)
 [facility] Cancer Follow up:    [name], Harlene BROCKS, MD [address] [place] [address]   DIAGNOSIS: Cancer Staging  Endometrial cancer [facility]) Staging form: Corpus Uteri - Carcinoma, AJCC 7th Edition - Clinical: FIGO Stage IB (T1b, N0, M0) - Signed by Layla Sandria BROCKS, MD on 09/21/2020 Histologic grade (G): G2  Malignant neoplasm of upper-outer quadrant of left breast in female, estrogen receptor positive (HCC) Staging form: Breast, AJCC 8th Edition - Clinical: Stage IA (cT1c, cN0, cM0, G3, ER+, PR+, HER2: Equivocal) - Signed by Layla Sandria BROCKS, MD on 09/21/2020  I connected with Arlean VEAR Reins on 09/24/2024 at  2:00 PM EST by telephone and verified that I am speaking with the correct person using two identifiers. I discussed the limitations, risks, security and privacy concerns of performing an evaluation and management service by telephone and the availability of in person appointments. I also discussed with the patient that there may be a patient responsible charge related to this service. The patient expressed understanding and agreed to proceed.   Patient location: home in [place] Provider location:  [facility] provider office Others participating in call: none   SUMMARY OF ONCOLOGIC HISTORY: 09/19/2020: Right breast biopsy: T1CN0 stage Ia grade 3 IDC ER/PR positive, HER2 equal vocal, FISH negative, Ki-67 10% (genetics negative) 10/19/2020: Left lumpectomy: Grade 2 IDC negative margins (Oncotype DX: Score 15: 4% ROR) 12/20/2020-01/10/2021: Adjuvant radiation 08/2020 began anastrozole  daily  CURRENT THERAPY: anastrozole   INTERVAL HISTORY:  Discussed the use of AI scribe software for clinical note transcription with the patient, who gave verbal consent to proceed.  History of Present Illness Dawn Powers is an 78 year old female with osteoporosis and osteopenia who presents for follow-up of bone density.  She is here for routine bone mineral density  monitoring after a recent scan that showed osteoporosis. The right hip T-score improved from -2.5 in 2023 to -2.2, the left hip T-score is stable at -2.3, and the forearm T-score improved from -3.9 to -3.7.     Patient Active Problem List   Diagnosis Date Noted   Sinusitis 06/07/2024   Osteoporosis 06/12/2021   Genetic testing 12/18/2020   Family history of prostate cancer    Family history of breast cancer    Family history of kidney cancer    Malignant neoplasm of upper-outer quadrant of left breast in female, 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/21/2020   Prediabetes 07/02/2019   S/P shoulder replacement, 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   Endometrial cancer (HCC) 09/26/2015   Essential hypertension 05/23/2015   Thoracic facet joint syndrome 04/27/2014   Back pain 03/24/2014   Muscle spasm 03/24/2014   Idiopathic scoliosis and kyphoscoliosis 12/22/2012   Osteopenia 12/22/2012   Other and unspecified hyperlipidemia 12/22/2012    is allergic to morphine.  MEDICAL HISTORY: Past Medical History:  Diagnosis Date   Anxiety    Arthritis    Breast cancer (HCC) 09/19/2020   left breast [facility] w/ radiation no chemo   Cancer (HCC)    Endometrial    Family history of breast cancer    Family history of kidney cancer    Family history of prostate cancer    Heart murmur    Hyperlipidemia    Hypertension    Neuromuscular disorder (HCC)    DDD   Osteoporosis 06/12/2021   Pain    lower back -hx DDD   Personal history of radiation therapy    left [facility] 08/2020 w/ radiation no chemo   Radiation  11/15/15, 11/23/15, 11/30/15, 12/07/15, 12/14/15   HDR brachytherapy    SURGICAL HISTORY: Past Surgical History:  Procedure Laterality Date   ABDOMINAL HYSTERECTOMY     BREAST LUMPECTOMY Left 10/19/2020   left lumpectomy 10-19-20   BREAST LUMPECTOMY WITH RADIOACTIVE SEED AND SENTINEL LYMPH NODE BIOPSY Left 10/19/2020   Procedure: LEFT BREAST LUMPECTOMY WITH RADIOACTIVE SEED AND SENTINEL LYMPH NODE BIOPSY;   Surgeon: Aron Shoulders, MD;  Location: [facility];  Service: General;  Laterality: Left;  PEC BLOCK, RNFA   CATARACT EXTRACTION Bilateral    COLONOSCOPY     FRACTURE SURGERY Right    ankle   GANGLION CYST EXCISION     LYMPH NODE BIOPSY N/A 09/26/2015   Procedure: SENTINEL LYMPH NODE BIOPSY;  Surgeon: Maurilio Ship, MD;  Location: [facility];  Service: Gynecology;  Laterality: N/A;   ROBOTIC ASSISTED TOTAL HYSTERECTOMY WITH BILATERAL SALPINGO OOPHERECTOMY Bilateral 09/26/2015   Procedure: ROBOTIC ASSISTED TOTAL HYSTERECTOMY WITH BILATERAL SALPINGO OOPHORECTOMY;  Surgeon: Maurilio Ship, MD;  Location: [facility];  Service: Gynecology;  Laterality: Bilateral;   TOTAL SHOULDER ARTHROPLASTY Left 09/10/2018   Procedure: TOTAL SHOULDER ARTHROPLASTY;  Surgeon: Melita Drivers, MD;  Location: [facility];  Service: Orthopedics;  Laterality: Left;    WISDOM TOOTH EXTRACTION     WISDOM TOOTH EXTRACTION      SOCIAL HISTORY: Social History   Socioeconomic History   Marital status: Married    Spouse name: Curtistine   Number of children: 2   Years of education: Not on file   Highest education level: Not on file  Occupational History   Occupation: retired manufacturing engineer  Tobacco Use   Smoking status: Former    Current packs/day: 0.00    Types: Cigarettes    Start date: 09/30/1994    Quit date: 09/30/1998    Years since quitting: 25.9   Smokeless tobacco: Never  Vaping Use   Vaping status: Never Used  Substance and Sexual Activity   Alcohol use: Yes    Alcohol/week: 3.0 standard drinks of alcohol    Types: 3 Standard drinks or equivalent per week    Comment: 1 drink 3 times per week   Drug use: No   Sexual activity: Yes    Partners: Female    Birth control/protection: Surgical  Other Topics Concern   Not on file  Social History Narrative   Married. Education: college and Other. Exercise 4 times a week for 50 minutes.   Social Drivers of Health   Tobacco Use: Medium Risk (06/28/2024)    Patient History    Smoking Tobacco Use: Former    Smokeless Tobacco Use: Never    Passive Exposure: Not on file  Financial Resource Strain: Low Risk (04/22/2023)   Overall Financial Resource Strain (CARDIA)    Difficulty of Paying Living Expenses: Not hard at all  Food Insecurity: No Food Insecurity (04/22/2023)   Hunger Vital Sign    Worried About Running Out of Food in the Last Year: Never true    Ran Out of Food in the Last Year: Never true  Transportation Needs: No Transportation Needs (04/22/2023)   PRAPARE - Administrator, Civil Service (Medical): No    Lack of Transportation (Non-Medical): No  Physical Activity: Sufficiently Active (04/22/2023)   Exercise Vital Sign    Days of Exercise per Week: 7 days    Minutes of Exercise per Session: 30 min  Stress: Stress Concern Present (04/22/2023)   [employer] of Occupational Health - Occupational  Stress Questionnaire    Feeling of Stress : Rather much  Social Connections: Socially Integrated (04/22/2023)   Social Connection and Isolation Panel    Frequency of Communication with Friends and Family: More than three times a week    Frequency of Social Gatherings with Friends and Family: Once a week    Attends Religious Services: More than 4 times per year    Active Member of Clubs or Organizations: Yes    Attends Banker Meetings: More than 4 times per year    Marital Status: Married  Catering Manager Violence: Not At Risk (04/22/2023)   Humiliation, Afraid, Rape, and Kick questionnaire    Fear of Current or Ex-Partner: No    Emotionally Abused: No    Physically Abused: No    Sexually Abused: No  Depression (PHQ2-9): Low Risk (07/12/2024)   Depression (PHQ2-9)    PHQ-2 Score: 1  Recent Concern: Depression (PHQ2-9) - Medium Risk (06/07/2024)   Depression (PHQ2-9)    PHQ-2 Score: 5  Alcohol Screen: Low Risk (04/22/2023)   Alcohol Screen    Last Alcohol Screening Score (AUDIT): 3  Housing: Low Risk  (04/22/2023)   Housing    Last Housing Risk Score: 0  Utilities: Not At Risk (04/22/2023)   AHC Utilities    Threatened with loss of utilities: No  Health Literacy: Adequate Health Literacy (04/22/2023)   B1300 Health Literacy    Frequency of need for help with medical instructions: Never    FAMILY HISTORY: Family History  Problem Relation Age of Onset   Heart disease Mother    Alcohol abuse Mother        breast cancer - questionable    Dementia Mother    Alcohol abuse Father    Prostate cancer Father 67       prostate and colon- unsure of primary    Kidney cancer Brother 7   Alcohol abuse Maternal Grandfather    Alcohol abuse Sister    Basal cell carcinoma Sister    Breast cancer Cousin        double mastectomy, d. 13   Prostate cancer Brother 54   Esophageal cancer Neg Hx    Stomach cancer Neg Hx    Rectal cancer Neg Hx     Review of Systems  Constitutional:  Negative for appetite change, chills, fatigue, fever and unexpected weight change.  HENT:   Negative for hearing loss, lump/mass and trouble swallowing.   Eyes:  Negative for eye problems and icterus.  Respiratory:  Negative for chest tightness, cough and shortness of breath.   Cardiovascular:  Negative for chest pain, leg swelling and palpitations.  Gastrointestinal:  Negative for abdominal distention, abdominal pain, constipation, diarrhea, nausea and vomiting.  Endocrine: Negative for hot flashes.  Genitourinary:  Negative for difficulty urinating.   Musculoskeletal:  Negative for arthralgias.  Skin:  Negative for itching and rash.  Neurological:  Negative for dizziness, extremity weakness, headaches and numbness.  Hematological:  Negative for adenopathy. Does not bruise/bleed easily.  Psychiatric/Behavioral:  Negative for depression. The patient is not nervous/anxious.       PHYSICAL EXAMINATION  Patient sounds well, no apparent distress.     Assessment and Plan Assessment &  Plan Osteoporosis Chronic osteoporosis with mild improvement in right hip and forearm bone density, stability in left hip. - Reviewed testing results with patient, answered any questions.   - Continued Prolia  therapy. - Confirmed next Prolia  injection for April 30th at 9 AM.   The  patient was provided an opportunity to ask questions and all were answered. The patient agreed with the plan and demonstrated an understanding of the instructions.   The patient was advised to call back or seek an in-person evaluation if the symptoms worsen or if the condition fails to improve as anticipated.   I provided 5 minutes of non face-to-face telephone visit time during this encounter, and > 50% was spent counseling as documented under my assessment & plan.  Morna Kendall, NP 09/17/2024 2:13 PM Medical Oncology and Hematology [facility] [address] [place], [place] [address] Tel. [phone]    Fax. [phone]  *Total Encounter Time as defined by the Centers for Medicare and Medicaid Services includes, in addition to the face-to-face time of a patient visit (documented in the note above) non-face-to-face time: obtaining and reviewing outside history, ordering and reviewing medications, tests or procedures, care coordination (communications with other health care professionals or caregivers) and documentation in the medical record.

## 2024-09-24 ENCOUNTER — Ambulatory Visit: Payer: Self-pay

## 2024-09-24 ENCOUNTER — Telehealth: Payer: Self-pay

## 2024-09-24 ENCOUNTER — Encounter: Payer: Self-pay | Admitting: Oncology

## 2024-09-24 DIAGNOSIS — Z20828 Contact with and (suspected) exposure to other viral communicable diseases: Secondary | ICD-10-CM

## 2024-09-24 DIAGNOSIS — Z7721 Contact with and (suspected) exposure to potentially hazardous body fluids: Secondary | ICD-10-CM

## 2024-09-24 NOTE — Telephone Encounter (Signed)
 Copied from CRM [id number]. Topic: Clinical - Medical Advice >> Sep 24, 2024  4:35 PM Chasity T wrote: Reason for CRM: Patient is calling because her husband has been tested positive for influenza a during the holidays and she was told that because she was exposed that she can take therma flu to prevent her from getting it. She is wanting Dr Tia advice on if she should take that or not and if so can she send a prescription for it.

## 2024-09-24 NOTE — Telephone Encounter (Signed)
 FYI Only or Action Required?: FYI only for provider: patient's husband tested positive for flu-patient with no symptoms. Discussed home care options. Patient will test herself if she starts with symptoms. .  Patient was last seen in primary care on 06/28/2024 by [name], Harlene BROCKS, MD.  Called Nurse Triage reporting Flu exposure.  Triage Disposition: Call PCP When Office is Open  Patient/caregiver understands and will follow disposition?: Yes  Message from Chasity T sent at 09/24/2024  4:42 PM EST  Reason for Triage: patient was exposed to influenza a and is wanting therma flu to pre caution. She wants to know if its ok to take.   Reason for Disposition  [1] Influenza EXPOSURE (Close Contact) within last 7 days AND [2] exposed person is HIGH RISK (e.g., 65 years and older, pregnant, HIV+, chronic medical condition)  Answer Assessment - Initial Assessment Questions Patient's husband tested positive today for flu. Patient reports no symptoms. Has been with her husband since he started having symptoms on Wednesday. Reports use of masks and OTC options. Did explain to patient that if she started having symptoms to take a home test and then she could make an urgent care virtual visit over the weekend. Patient verbalized understanding.   1. TYPE of EXPOSURE: How were you exposed? (e.g., close contact, not a close contact)     Close contact-husband tested positive today.  2. DATE of EXPOSURE: When did the exposure occur? (e.g., hour, days, weeks)     Husband started having symptoms two days ago.  3. SYMPTOMS: Do you have any symptoms? (e.g., cough, fever, sore throat, difficulty breathing).     no 4. HIGH RISK for COMPLICATIONS: Do you have any heart or lung problems? Do you have a weakened immune system? (e.g., CHF, COPD, asthma, HIV positive, chemotherapy, renal failure, diabetes mellitus, sickle cell anemia)     no  Protocols used: Influenza (Flu) Exposure-A-AH

## 2024-09-25 NOTE — Telephone Encounter (Signed)
 Called her back- she does not want to use tamiflu really due to potential SE She is not ill as of yet- her husband got sick 4 days ago They both got their flu shot  She will let me know if she does get sick

## 2024-09-27 ENCOUNTER — Other Ambulatory Visit: Payer: Self-pay | Admitting: Family Medicine

## 2024-09-27 MED ORDER — OSELTAMIVIR PHOSPHATE 30 MG PO CAPS: 30.0000 mg | ORAL_CAPSULE | Freq: Every day | ORAL | 0 refills | Status: AC

## 2024-09-27 MED ORDER — OSELTAMIVIR PHOSPHATE 30 MG PO CAPS: 30.0000 mg | ORAL_CAPSULE | Freq: Every day | ORAL | 0 refills | Status: DC

## 2024-09-27 NOTE — Addendum Note (Signed)
 Addended by: WATT RAISIN C on: 09/27/2024 09:20 AM   Modules accepted: Orders

## 2024-10-11 ENCOUNTER — Other Ambulatory Visit: Payer: Self-pay | Admitting: Hematology and Oncology

## 2024-10-11 DIAGNOSIS — Z1231 Encounter for screening mammogram for malignant neoplasm of breast: Secondary | ICD-10-CM

## 2024-10-25 ENCOUNTER — Encounter: Payer: Self-pay | Admitting: Family Medicine

## 2024-11-01 ENCOUNTER — Ambulatory Visit: Payer: Self-pay

## 2024-11-01 NOTE — Telephone Encounter (Signed)
 FYI Only or Action Required?: FYI only for provider: appointment scheduled on 11/03/24.  Patient was last seen in primary care on 06/28/2024 by [name], Harlene BROCKS, MD.  Called Nurse Triage reporting Shortness of Breath.  Symptoms began 1 month comes and goes .  Interventions attempted: Nothing.  Symptoms are: unchanged.  Triage Disposition: See PCP Within 2 Weeks  Patient/caregiver understands and will follow disposition?: Yes Pt wanting blood work to r/o any disorder    Message from Alexandria E sent at 11/01/2024  2:46 PM EST  Summary: Neuropathy and running out of breath   Reason for Triage: Worsening neuropathy in fingers and feet, running out of breath just doing daily living activities, patient has been noticing it more.         Reason for Disposition  [1] MILD longstanding difficulty breathing (e.g., minimal/no SOB at rest, SOB with walking, pulse < 100) AND [2] SAME as normal    No h/o lung disease  Answer Assessment - Initial Assessment Questions 1. RESPIRATORY STATUS: Describe your breathing? (e.g., wheezing, shortness of breath, unable to speak, severe coughing)      SOB notices gets SOB more often  2. ONSET: When did this breathing problem begin?     1 Month  3. PATTERN Does the difficult breathing come and go, or has it been constant since it started?      Comes and goes  4. SEVERITY: How bad is your breathing? (e.g., mild, moderate, severe)      Mild  5. RECURRENT SYMPTOM: Have you had difficulty breathing before? If Yes, ask: When was the last time? and What happened that time?      no 6. CARDIAC HISTORY: Do you have any history of heart disease? (e.g., heart attack, angina, bypass surgery, angioplasty)      HTN 7. LUNG HISTORY: Do you have any history of lung disease?  (e.g., pulmonary embolus, asthma, emphysema)     no 8. CAUSE: What do you think is causing the breathing problem?      Thinks cause getting older  9. OTHER SYMPTOMS: Do  you have any other symptoms? (e.g., chest pain, cough, dizziness, fever, runny nose)     no  Protocols used: Breathing Difficulty-A-AH

## 2024-11-03 ENCOUNTER — Ambulatory Visit: Admitting: Family Medicine

## 2024-11-03 ENCOUNTER — Encounter: Payer: Self-pay | Admitting: Family Medicine

## 2024-11-03 ENCOUNTER — Ambulatory Visit (HOSPITAL_BASED_OUTPATIENT_CLINIC_OR_DEPARTMENT_OTHER)
Admission: RE | Admit: 2024-11-03 | Discharge: 2024-11-03 | Disposition: A | Discharge status: Home / Self Care | Source: Ambulatory Visit | Attending: Family Medicine | Admitting: Family Medicine

## 2024-11-03 VITALS — BP 136/80 | HR 62 | Ht 63.0 in | Wt 122.0 lb

## 2024-11-03 DIAGNOSIS — E559 Vitamin D deficiency, unspecified: Secondary | ICD-10-CM

## 2024-11-03 DIAGNOSIS — Z13220 Encounter for screening for lipoid disorders: Secondary | ICD-10-CM

## 2024-11-03 DIAGNOSIS — R5383 Other fatigue: Secondary | ICD-10-CM

## 2024-11-03 DIAGNOSIS — Z131 Encounter for screening for diabetes mellitus: Secondary | ICD-10-CM

## 2024-11-03 DIAGNOSIS — R718 Other abnormality of red blood cells: Secondary | ICD-10-CM

## 2024-11-03 DIAGNOSIS — R0602 Shortness of breath: Secondary | ICD-10-CM

## 2024-11-03 NOTE — Patient Instructions (Signed)
 It was good to see you today- I will be in touch with your labs and we will set you up for a treadmill test asap Please also get a chest x-ray today

## 2024-11-04 ENCOUNTER — Encounter: Payer: Self-pay | Admitting: Family Medicine

## 2024-11-04 ENCOUNTER — Telehealth (HOSPITAL_COMMUNITY): Payer: Self-pay | Admitting: Family Medicine

## 2024-11-04 ENCOUNTER — Encounter (HOSPITAL_COMMUNITY): Payer: Self-pay | Admitting: *Deleted

## 2024-11-04 LAB — LIPID PANEL
Cholesterol: 249 mg/dL — ABNORMAL HIGH (ref 28–200)
HDL: 80 mg/dL
LDL Cholesterol: 145 mg/dL — ABNORMAL HIGH (ref 10–99)
NonHDL: 168.84
Total CHOL/HDL Ratio: 3
Triglycerides: 119 mg/dL (ref 10.0–149.0)
VLDL: 23.8 mg/dL (ref 0.0–40.0)

## 2024-11-04 LAB — FERRITIN: Ferritin: 54.2 ng/mL (ref 10.0–291.0)

## 2024-11-04 LAB — VITAMIN D 25 HYDROXY (VIT D DEFICIENCY, FRACTURES): VITD: 32.79 ng/mL (ref 30.00–100.00)

## 2024-11-04 LAB — VITAMIN B12: Vitamin B-12: 886 pg/mL (ref 211–911)

## 2024-11-04 LAB — TSH: TSH: 2.67 u[IU]/mL (ref 0.35–5.50)

## 2024-11-04 LAB — HEMOGLOBIN A1C: Hgb A1c MFr Bld: 5.9 % (ref 4.6–6.5)

## 2024-11-04 NOTE — Telephone Encounter (Signed)
 Patient called and cancelled the ordered GXT for 11/05/24. Patient states she got some results yesterday and doesn't think she needs the test. She will call back if she decided to have after speaking with MD. Order will be removed from the active echo WQ. If patient calls back we will reinstate the order. Thank you.

## 2024-11-05 ENCOUNTER — Encounter (HOSPITAL_COMMUNITY)

## 2024-11-12 ENCOUNTER — Ambulatory Visit

## 2025-01-27 ENCOUNTER — Inpatient Hospital Stay

## 2025-01-27 ENCOUNTER — Inpatient Hospital Stay: Attending: Adult Health | Admitting: Hematology and Oncology
